# Patient Record
Sex: Female | Born: 1960 | Race: Black or African American | Hispanic: No | Marital: Married | Smoking: Former smoker
Health system: Southern US, Community
[De-identification: ages and names within clinical notes are randomized; demographics above are authoritative.]

## PROBLEM LIST (undated history)

## (undated) DIAGNOSIS — F3289 Other specified depressive episodes: Secondary | ICD-10-CM

## (undated) DIAGNOSIS — Z91018 Allergy to other foods: Secondary | ICD-10-CM

## (undated) DIAGNOSIS — R609 Edema, unspecified: Secondary | ICD-10-CM

## (undated) DIAGNOSIS — K5900 Constipation, unspecified: Secondary | ICD-10-CM

## (undated) DIAGNOSIS — N390 Urinary tract infection, site not specified: Secondary | ICD-10-CM

## (undated) DIAGNOSIS — F419 Anxiety disorder, unspecified: Secondary | ICD-10-CM

## (undated) DIAGNOSIS — M2550 Pain in unspecified joint: Secondary | ICD-10-CM

## (undated) DIAGNOSIS — N951 Menopausal and female climacteric states: Secondary | ICD-10-CM

## (undated) DIAGNOSIS — F329 Major depressive disorder, single episode, unspecified: Secondary | ICD-10-CM

## (undated) DIAGNOSIS — K219 Gastro-esophageal reflux disease without esophagitis: Secondary | ICD-10-CM

## (undated) DIAGNOSIS — E669 Obesity, unspecified: Secondary | ICD-10-CM

## (undated) DIAGNOSIS — G4730 Sleep apnea, unspecified: Secondary | ICD-10-CM

## (undated) DIAGNOSIS — R0602 Shortness of breath: Secondary | ICD-10-CM

## (undated) HISTORY — DX: Other specified depressive episodes: F32.89

## (undated) HISTORY — DX: Gastro-esophageal reflux disease without esophagitis: K21.9

## (undated) HISTORY — DX: Anxiety disorder, unspecified: F41.9

## (undated) HISTORY — DX: Obesity, unspecified: E66.9

## (undated) HISTORY — DX: Shortness of breath: R06.02

## (undated) HISTORY — DX: Urinary tract infection, site not specified: N39.0

## (undated) HISTORY — DX: Pain in unspecified joint: M25.50

## (undated) HISTORY — DX: Edema, unspecified: R60.9

## (undated) HISTORY — DX: Sleep apnea, unspecified: G47.30

## (undated) HISTORY — PX: ABDOMINAL HYSTERECTOMY: SHX81

## (undated) HISTORY — DX: Major depressive disorder, single episode, unspecified: F32.9

## (undated) HISTORY — DX: Allergy to other foods: Z91.018

## (undated) HISTORY — DX: Menopausal and female climacteric states: N95.1

## (undated) HISTORY — DX: Constipation, unspecified: K59.00

---

## 1998-11-04 HISTORY — PX: ABDOMINAL HYSTERECTOMY: SHX81

## 1999-04-24 ENCOUNTER — Other Ambulatory Visit: Admission: RE | Admit: 1999-04-24 | Discharge: 1999-04-24 | Payer: Self-pay | Admitting: Family Medicine

## 1999-05-26 ENCOUNTER — Other Ambulatory Visit: Admission: RE | Admit: 1999-05-26 | Discharge: 1999-05-26 | Payer: Self-pay | Admitting: Obstetrics and Gynecology

## 1999-07-01 ENCOUNTER — Inpatient Hospital Stay (HOSPITAL_COMMUNITY): Admission: RE | Admit: 1999-07-01 | Discharge: 1999-07-05 | Payer: Self-pay | Admitting: Obstetrics and Gynecology

## 1999-07-01 ENCOUNTER — Encounter (INDEPENDENT_AMBULATORY_CARE_PROVIDER_SITE_OTHER): Payer: Self-pay | Admitting: Specialist

## 2000-06-16 ENCOUNTER — Encounter: Admission: RE | Admit: 2000-06-16 | Discharge: 2000-06-30 | Payer: Self-pay | Admitting: Occupational Medicine

## 2003-06-07 ENCOUNTER — Encounter: Admission: RE | Admit: 2003-06-07 | Discharge: 2003-06-28 | Payer: Self-pay | Admitting: Family Medicine

## 2004-10-26 ENCOUNTER — Ambulatory Visit: Payer: Self-pay | Admitting: Family Medicine

## 2005-01-25 ENCOUNTER — Other Ambulatory Visit: Admission: RE | Admit: 2005-01-25 | Discharge: 2005-01-25 | Payer: Self-pay | Admitting: Family Medicine

## 2005-01-25 ENCOUNTER — Ambulatory Visit: Payer: Self-pay | Admitting: Family Medicine

## 2005-01-25 LAB — CONVERTED CEMR LAB: Pap Smear: NORMAL

## 2006-04-15 ENCOUNTER — Ambulatory Visit: Payer: Self-pay | Admitting: Family Medicine

## 2006-05-09 ENCOUNTER — Ambulatory Visit: Payer: Self-pay | Admitting: Internal Medicine

## 2006-09-10 ENCOUNTER — Ambulatory Visit: Payer: Self-pay | Admitting: Family Medicine

## 2007-04-04 ENCOUNTER — Telehealth: Payer: Self-pay | Admitting: Family Medicine

## 2007-04-12 ENCOUNTER — Encounter: Payer: Self-pay | Admitting: Family Medicine

## 2007-04-12 DIAGNOSIS — R51 Headache: Secondary | ICD-10-CM

## 2007-04-12 DIAGNOSIS — N390 Urinary tract infection, site not specified: Secondary | ICD-10-CM | POA: Insufficient documentation

## 2007-04-12 DIAGNOSIS — F39 Unspecified mood [affective] disorder: Secondary | ICD-10-CM | POA: Insufficient documentation

## 2007-04-12 DIAGNOSIS — R609 Edema, unspecified: Secondary | ICD-10-CM | POA: Insufficient documentation

## 2007-04-12 DIAGNOSIS — K219 Gastro-esophageal reflux disease without esophagitis: Secondary | ICD-10-CM

## 2007-04-12 DIAGNOSIS — L089 Local infection of the skin and subcutaneous tissue, unspecified: Secondary | ICD-10-CM | POA: Insufficient documentation

## 2007-04-12 DIAGNOSIS — F32A Depression, unspecified: Secondary | ICD-10-CM | POA: Insufficient documentation

## 2007-04-12 DIAGNOSIS — R519 Headache, unspecified: Secondary | ICD-10-CM | POA: Insufficient documentation

## 2007-04-12 DIAGNOSIS — F329 Major depressive disorder, single episode, unspecified: Secondary | ICD-10-CM

## 2007-04-19 ENCOUNTER — Encounter (INDEPENDENT_AMBULATORY_CARE_PROVIDER_SITE_OTHER): Payer: Self-pay | Admitting: *Deleted

## 2007-06-20 ENCOUNTER — Ambulatory Visit: Payer: Self-pay | Admitting: Family Medicine

## 2007-06-20 DIAGNOSIS — L259 Unspecified contact dermatitis, unspecified cause: Secondary | ICD-10-CM

## 2007-06-23 LAB — CONVERTED CEMR LAB
BUN: 7 mg/dL (ref 6–23)
CO2: 30 meq/L (ref 19–32)
Glucose, Bld: 108 mg/dL — ABNORMAL HIGH (ref 70–99)
Phosphorus: 3.4 mg/dL (ref 2.3–4.6)
Potassium: 3.9 meq/L (ref 3.5–5.1)
Sodium: 141 meq/L (ref 135–145)

## 2007-07-27 ENCOUNTER — Encounter: Payer: Self-pay | Admitting: Family Medicine

## 2007-11-14 ENCOUNTER — Encounter: Payer: Self-pay | Admitting: Family Medicine

## 2007-11-23 ENCOUNTER — Encounter (INDEPENDENT_AMBULATORY_CARE_PROVIDER_SITE_OTHER): Payer: Self-pay | Admitting: *Deleted

## 2008-01-29 ENCOUNTER — Ambulatory Visit: Payer: Self-pay | Admitting: Family Medicine

## 2008-01-30 ENCOUNTER — Encounter: Admission: RE | Admit: 2008-01-30 | Discharge: 2008-01-30 | Payer: Self-pay | Admitting: Family Medicine

## 2008-01-30 LAB — CONVERTED CEMR LAB
BUN: 7 mg/dL (ref 6–23)
Calcium: 9.1 mg/dL (ref 8.4–10.5)
Creatinine, Ser: 0.9 mg/dL (ref 0.4–1.2)
GFR calc Af Amer: 86 mL/min
Glucose, Bld: 80 mg/dL (ref 70–99)
Sodium: 141 meq/L (ref 135–145)

## 2008-02-06 ENCOUNTER — Encounter: Payer: Self-pay | Admitting: Family Medicine

## 2008-02-07 ENCOUNTER — Ambulatory Visit: Payer: Self-pay

## 2008-02-15 ENCOUNTER — Ambulatory Visit: Payer: Self-pay | Admitting: Family Medicine

## 2008-02-15 DIAGNOSIS — E669 Obesity, unspecified: Secondary | ICD-10-CM | POA: Insufficient documentation

## 2008-02-27 ENCOUNTER — Encounter: Payer: Self-pay | Admitting: Family Medicine

## 2008-02-29 ENCOUNTER — Encounter: Admission: RE | Admit: 2008-02-29 | Discharge: 2008-02-29 | Payer: Self-pay | Admitting: Orthopedic Surgery

## 2008-03-01 ENCOUNTER — Encounter: Payer: Self-pay | Admitting: Family Medicine

## 2008-12-05 ENCOUNTER — Encounter: Payer: Self-pay | Admitting: Family Medicine

## 2008-12-09 ENCOUNTER — Encounter (INDEPENDENT_AMBULATORY_CARE_PROVIDER_SITE_OTHER): Payer: Self-pay | Admitting: *Deleted

## 2009-04-23 ENCOUNTER — Ambulatory Visit: Payer: Self-pay | Admitting: Family Medicine

## 2009-04-23 DIAGNOSIS — N951 Menopausal and female climacteric states: Secondary | ICD-10-CM | POA: Insufficient documentation

## 2009-04-23 DIAGNOSIS — B353 Tinea pedis: Secondary | ICD-10-CM | POA: Insufficient documentation

## 2009-04-28 LAB — CONVERTED CEMR LAB
ALT: 19 units/L (ref 0–35)
AST: 21 units/L (ref 0–37)
Albumin: 3.6 g/dL (ref 3.5–5.2)
BUN: 8 mg/dL (ref 6–23)
CO2: 30 meq/L (ref 19–32)
Calcium: 8.8 mg/dL (ref 8.4–10.5)
Chloride: 109 meq/L (ref 96–112)
Creatinine, Ser: 0.9 mg/dL (ref 0.4–1.2)
Total Protein: 6.3 g/dL (ref 6.0–8.3)

## 2009-04-30 ENCOUNTER — Ambulatory Visit: Payer: Self-pay | Admitting: Gastroenterology

## 2009-05-07 ENCOUNTER — Ambulatory Visit: Payer: Self-pay | Admitting: Gastroenterology

## 2009-05-07 LAB — HM COLONOSCOPY

## 2010-06-18 ENCOUNTER — Encounter: Payer: Self-pay | Admitting: Family Medicine

## 2010-06-18 LAB — HM MAMMOGRAPHY: HM Mammogram: NORMAL

## 2010-06-24 ENCOUNTER — Encounter (INDEPENDENT_AMBULATORY_CARE_PROVIDER_SITE_OTHER): Payer: Self-pay | Admitting: *Deleted

## 2010-06-24 ENCOUNTER — Encounter: Payer: Self-pay | Admitting: Family Medicine

## 2010-11-03 NOTE — Letter (Signed)
Summary: Results Follow up Letter  [place] [facility]  [address] [place], [place] [address]   Phone: [phone]  Fax: [phone]    06/24/2010 MRN: [id number]    [facility] [address] [place], [place]  [address]    Dear Molly Bean,  The following are the results of your recent test(s):  Test         Result    Pap Smear:        Normal _____  Not Normal _____ Comments: ______________________________________________________ Cholesterol: LDL(Bad cholesterol):         Your goal is less than:         HDL (Good cholesterol):       Your goal is more than: Comments:  ______________________________________________________ Mammogram:        Normal ___X__  Not Normal _____ Comments:  Yearly follow up is recommended.   ___________________________________________________________________ Hemoccult:        Normal _____  Not normal _______ Comments:    _____________________________________________________________________ Other Tests:    We routinely do not discuss normal results over the telephone.  If you desire a copy of the results, or you have any questions about this information we can discuss them at your next office visit.   Sincerely,    Dwana Curd. Para March, M.D.  [facility]

## 2010-11-03 NOTE — Miscellaneous (Signed)
   Clinical Lists Changes  Observations: Added new observation of MAMMO DUE: 06/19/2011 (06/18/2010 10:45) Added new observation of MAMMOGRAM: Normal (06/18/2010 10:45)

## 2010-11-06 ENCOUNTER — Encounter: Payer: Self-pay | Admitting: Internal Medicine

## 2010-11-06 ENCOUNTER — Ambulatory Visit: Payer: Self-pay | Admitting: Internal Medicine

## 2010-11-06 ENCOUNTER — Other Ambulatory Visit: Payer: BC Managed Care – PPO

## 2010-11-06 ENCOUNTER — Other Ambulatory Visit: Payer: Self-pay | Admitting: Internal Medicine

## 2010-11-06 ENCOUNTER — Ambulatory Visit (INDEPENDENT_AMBULATORY_CARE_PROVIDER_SITE_OTHER): Payer: BC Managed Care – PPO | Admitting: Internal Medicine

## 2010-11-06 DIAGNOSIS — D179 Benign lipomatous neoplasm, unspecified: Secondary | ICD-10-CM | POA: Insufficient documentation

## 2010-11-06 DIAGNOSIS — Z0000 Encounter for general adult medical examination without abnormal findings: Secondary | ICD-10-CM

## 2010-11-06 LAB — BASIC METABOLIC PANEL
BUN: 9 mg/dL (ref 6–23)
CO2: 32 mEq/L (ref 19–32)
Chloride: 104 mEq/L (ref 96–112)
Creatinine, Ser: 0.8 mg/dL (ref 0.4–1.2)
Potassium: 3.9 mEq/L (ref 3.5–5.1)

## 2010-11-06 LAB — URINALYSIS, ROUTINE W REFLEX MICROSCOPIC
Bilirubin Urine: NEGATIVE
Hgb urine dipstick: NEGATIVE
Leukocytes, UA: NEGATIVE
Nitrite: NEGATIVE
Urobilinogen, UA: 0.2 (ref 0.0–1.0)

## 2010-11-06 LAB — CBC WITH DIFFERENTIAL/PLATELET
Basophils Relative: 0.4 % (ref 0.0–3.0)
Eosinophils Absolute: 0.1 10*3/uL (ref 0.0–0.7)
Eosinophils Relative: 1.3 % (ref 0.0–5.0)
HCT: 36.5 % (ref 36.0–46.0)
Lymphs Abs: 2.9 10*3/uL (ref 0.7–4.0)
MCHC: 32.4 g/dL (ref 30.0–36.0)
MCV: 68.6 fl — ABNORMAL LOW (ref 78.0–100.0)
Monocytes Absolute: 0.5 10*3/uL (ref 0.1–1.0)
Neutrophils Relative %: 49.3 % (ref 43.0–77.0)
RBC: 5.31 Mil/uL — ABNORMAL HIGH (ref 3.87–5.11)
WBC: 6.9 10*3/uL (ref 4.5–10.5)

## 2010-11-06 LAB — LIPID PANEL
Cholesterol: 172 mg/dL (ref 0–200)
Triglycerides: 104 mg/dL (ref 0.0–149.0)

## 2010-11-06 LAB — HEPATIC FUNCTION PANEL
ALT: 15 U/L (ref 0–35)
Albumin: 3.8 g/dL (ref 3.5–5.2)
Bilirubin, Direct: 0 mg/dL (ref 0.0–0.3)
Total Protein: 6.6 g/dL (ref 6.0–8.3)

## 2010-11-06 LAB — TSH: TSH: 1.37 u[IU]/mL (ref 0.35–5.50)

## 2010-11-11 NOTE — Assessment & Plan Note (Signed)
Summary: new pt appt rs'd due to epic/cd   Vital Signs:  Patient profile:   50 year old female Height:      62 inches (157.48 cm) Weight:      221.8 pounds (100.82 kg) BMI:     40.71 O2 Sat:      97 % on Room air Temp:     98.0 degrees F (36.67 degrees C) oral Pulse rate:   72 / minute BP sitting:   112 / 72  (left arm) Cuff size:   regular  Vitals Entered By: Orlan Leavens RMA (November 06, 2010 3:58 PM)  O2 Flow:  Room air CC: New patient/ transferring from Dr. Milinda Antis Is Patient Diabetic? No Pain Assessment Patient in pain? no        Primary Care Provider:  Newt Lukes MD  CC:  New patient/ transferring from Dr. Milinda Antis.  History of Present Illness: new to me but known to our group - txfr from [facility] office ([place]) patient is here today for annual physical. Patient feels well-  concerned about tender "lump" over left flank noted (onset) 4 weeks ago no fever, no drainage, no redness, no change in size since onset no precipitating trauma  R foot dermatitis, recurrent prior eval derm 2009 - rx her pill and lotion did  bx - dx with fungal infections  meds worked for a while - took for 3 months --  clear for >63mo, now recurrent symptoms   no gyn since 2000 tot hyst with no symptoms at all  does not see gyn  is utd on mammogram      Preventive Screening-Counseling & Management  Alcohol-Tobacco     Alcohol drinks/day: 0     Alcohol Counseling: not indicated; patient does not drink     Smoking Status: quit     Year Quit: 1980     Tobacco Counseling: to remain off tobacco products  Caffeine-Diet-Exercise     Diet Counseling: to improve diet; diet is suboptimal     Does Patient Exercise: no     Exercise Counseling: to improve exercise regimen     Depression Counseling: not indicated; screening negative for depression  Safety-Violence-Falls     Seat Belt Counseling: not indicated; patient wears seat belts     Helmet Counseling: not applicable     Firearm  Counseling: not indicated; uses recommended firearm safety measures     Violence Counseling: not indicated; no violence risk noted     Fall Risk Counseling: not indicated; no significant falls noted  Clinical Review Panels:  Prevention   Last Mammogram:  Normal (06/18/2010)   Last Pap Smear:  Normal (01/25/2005)   Last Colonoscopy:  Location:  [facility].  (05/07/2009)  Immunizations   Last Tetanus Booster:  Td (04/23/2009)  Complete Metabolic Panel   Glucose:  75 (04/23/2009)   Sodium:  143 (04/23/2009)   Potassium:  3.7 (04/23/2009)   Chloride:  109 (04/23/2009)   CO2:  30 (04/23/2009)   BUN:  8 (04/23/2009)   Creatinine:  0.9 (04/23/2009)   Albumin:  3.6 (04/23/2009)   Total Protein:  6.3 (04/23/2009)   Calcium:  8.8 (04/23/2009)   Total Bili:  0.5 (04/23/2009)   Alk Phos:  68 (04/23/2009)   SGPT (ALT):  19 (04/23/2009)   SGOT (AST):  21 (04/23/2009)   Current Medications (verified): 1)  Est Estrogens-Methyltest Ds 1.25-2.5 Mg Tabs (Est Estrogens-Methyltest) .... Take 1 Tablet By Mouth Once A Day 2)  Amitriptyline Hcl  10 Mg  Tabs (Amitriptyline Hcl) .... Take Two By Mouth Q Hs 3)  Albuterol 90 Mcg/act  Aers (Albuterol) .... Use As Directed- 2 Puffs Q 4 Hours Prn 4)  Triamterene-Hctz 37.5-25 Mg  Tabs (Triamterene-Hctz) .... One Half To One By Mouth Qd 5)  Flexeril 10 Mg  Tabs (Cyclobenzaprine Hcl) .Marland Kitchen.. 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 I have reviewed all other systems and they were negative.   Physical Exam  General:  overweight-appearing.  alert, well-developed, well-nourished, and cooperative to examination.    Head:  Normocephalic and atraumatic without obvious abnormalities. No apparent alopecia or balding. Eyes:  vision grossly intact; pupils equal, round and reactive to light.  conjunctiva and lids normal.    Ears:  R ear normal and L ear normal.   Nose:  External nasal examination shows no deformity or inflammation. Nasal mucosa are pink and moist without lesions or exudates. Mouth:  teeth and gums in good repair; mucous membranes moist, without lesions or ulcers. oropharynx clear without exudate, no erythema.  Neck:  thick, supple, full ROM, no masses, no thyromegaly; no thyroid nodules or tenderness. no JVD or carotid bruits.   Lungs:  normal respiratory effort, no intercostal retractions or use of accessory muscles; normal breath sounds bilaterally - no crackles and no wheezes.    Heart:  normal rate, regular rhythm, no murmur, and no rub. BLE without edema. normal DP pulses and normal cap refill in all 4 extremities    Abdomen:  obese, soft, non-tender, normal bowel sounds, no distention; no masses and no appreciable hepatomegaly or splenomegaly.  lipoma structure 2x2.5 cm over right flank area Genitalia:  defer  Msk:  No deformity or scoliosis noted of thoracic or lumbar spine.   Neurologic:  alert & oriented X3 and cranial nerves II-XII symetrically intact.  strength normal in all extremities, sensation intact to light touch, and gait normal. speech fluent without dysarthria or aphasia; follows commands with good comprehension.  Skin:  no rashes, vesicles, ulcers, or erythema. No nodules or irregularity to  palpation except lipoma - see above (abd) Psych:  Oriented X3, memory intact for recent and remote, normally interactive, good eye contact, not anxious appearing, not depressed appearing, and not agitated.      Impression & Recommendations:  Problem # 1:  PREVENTIVE HEALTH CARE (ICD-V70.0) Patient has been counseled on age-appropriate routine health concerns for screening and prevention. These are reviewed and up-to-date. Immunizations are up-to-date or declined. Labs ordered and ECG reviewed - nsr, no ischemia Orders: TLB-Lipid Panel (80061-LIPID) TLB-BMP (Basic Metabolic Panel-BMET) (80048-METABOL) TLB-CBC Platelet - w/Differential (85025-CBCD) TLB-Hepatic/Liver Function Pnl (80076-HEPATIC) TLB-TSH (Thyroid Stimulating Hormone) (84443-TSH) TLB-Udip w/ Micro (81001-URINE) EKG w/ Interpretation (93000)  Problem # 2:  LIPOMA (ICD-214.9) [place] to reassure pt - also rx for nsaid given "tenderness" Orders: Misc. Referral (Misc. Ref)  Complete Medication List: 1)  Est Estrogens-methyltest Ds 1.25-2.5 Mg Tabs (Est estrogens-methyltest) .... Take 1 tablet by mouth once a day 2)  Amitriptyline Hcl 10 Mg Tabs (Amitriptyline hcl) .... Take two by mouth q hs 3)  Albuterol 90 Mcg/act Aers (Albuterol) .... Use as directed- 2 puffs q 4 hours prn 4)  Triamterene-hctz 37.5-25 Mg Tabs (Triamterene-hctz) .... One half to one by mouth qd 5)  Flexeril 10 Mg Tabs (Cyclobenzaprine hcl) .Marland Kitchen.. 1 by mouth three times a day as needed muscle pain 6)  Gris-peg 250 Mg Tabs (Griseofulvin ultramicrosize) .Marland Kitchen.. 1 by mouth once daily 7)  Naproxen 500 Mg Tabs (Naproxen) .Marland Kitchen.. 1 by mouth two times a day x 7 days, then as needed for pain  Patient Instructions: 1)  it was good to see you today. 2)  medications and history reviewed today 3)  test(s) ordered today - your results will be posted on the phone tree for review in 48-72 hours from the time of test completion; call [phone] and enter your 9 digit MRN (listed  above on this page, just below your name); if any changes need to be made or there are abnormal results, you will be contacted directly.  4)  we'll make referral for ultrasound of right side abdominal wall "nodule". Our office will contact you regarding this appointment once made.  5)  use naprosyn two times a day x 7 days for tenderness and pain symptoms - also refill on foot pill - your prescriptions have been electronically submitted to your pharmacy. Please take as directed. Contact our office if you believe you're having problems with the medication(s).  6)  Please schedule a follow-up appointment in 6-12 months, call sooner if problems.  Prescriptions: NAPROXEN 500 MG TABS (NAPROXEN) 1 by mouth two times a day x 7 days, then as needed for pain  #40 x 0   Entered and Authorized by:   Newt Lukes MD   Signed by:   Newt Lukes MD on 11/06/2010   Method used:   [facility]ically to        [facility]  [address] [phone]* (retail)       [address]       [place], [place]  [id number]       Ph: [phone] or [phone]       Fax: [phone]   RxID:   [id number] GRIS-PEG 250 MG TABS (GRISEOFULVIN ULTRAMICROSIZE) 1 by mouth once daily  #30 x 1   Entered and Authorized by:   Newt Lukes MD   Signed by:   Newt Lukes MD on 11/06/2010   Method used:   [facility]        [facility]  [address] [phone]* (retail)       [address]       [place], [place]  [id number]       Ph: [phone] or [phone]       Fax: [phone]   RxID:   [id number]    Orders Added: 1)  TLB-Lipid Panel [80061-LIPID] 2)  TLB-BMP (Basic Metabolic Panel-BMET) [80048-METABOL] 3)  TLB-CBC Platelet - w/Differential [85025-CBCD] 4)  TLB-Hepatic/Liver Function Pnl [80076-HEPATIC] 5)  TLB-TSH (Thyroid Stimulating Hormone) [84443-TSH] 6)  TLB-Udip w/ Micro [81001-URINE] 7)  EKG w/ Interpretation [93000]  8)  Misc. Referral  [Misc. Ref] 9)  Est. Patient 40-64 years [99396] 10)  Est. Patient Level II [04540]

## 2010-11-17 ENCOUNTER — Other Ambulatory Visit: Payer: Self-pay | Admitting: Internal Medicine

## 2010-11-17 DIAGNOSIS — R109 Unspecified abdominal pain: Secondary | ICD-10-CM

## 2010-11-24 ENCOUNTER — Ambulatory Visit
Admission: RE | Admit: 2010-11-24 | Discharge: 2010-11-24 | Disposition: A | Discharge status: Home / Self Care | Payer: BC Managed Care – PPO | Source: Ambulatory Visit | Attending: Internal Medicine | Admitting: Internal Medicine

## 2010-11-24 DIAGNOSIS — R109 Unspecified abdominal pain: Secondary | ICD-10-CM

## 2011-01-29 ENCOUNTER — Encounter: Payer: Self-pay | Admitting: Internal Medicine

## 2011-01-29 DIAGNOSIS — N951 Menopausal and female climacteric states: Secondary | ICD-10-CM

## 2011-02-01 ENCOUNTER — Encounter: Payer: Self-pay | Admitting: Internal Medicine

## 2011-02-01 ENCOUNTER — Ambulatory Visit (INDEPENDENT_AMBULATORY_CARE_PROVIDER_SITE_OTHER): Payer: BC Managed Care – PPO | Admitting: Internal Medicine

## 2011-02-01 ENCOUNTER — Ambulatory Visit: Payer: BC Managed Care – PPO | Admitting: Internal Medicine

## 2011-02-01 DIAGNOSIS — M7918 Myalgia, other site: Secondary | ICD-10-CM

## 2011-02-01 DIAGNOSIS — F329 Major depressive disorder, single episode, unspecified: Secondary | ICD-10-CM

## 2011-02-01 DIAGNOSIS — IMO0001 Reserved for inherently not codable concepts without codable children: Secondary | ICD-10-CM

## 2011-02-01 MED ORDER — BUPROPION HCL ER (XL) 150 MG PO TB24: 150.0000 mg | ORAL_TABLET | Freq: Every day | ORAL | Status: DC

## 2011-02-01 MED ORDER — MELOXICAM 15 MG PO TABS: 15.0000 mg | ORAL_TABLET | Freq: Every day | ORAL | Status: AC

## 2011-02-01 MED ORDER — METHOCARBAMOL 750 MG PO TABS: 750.0000 mg | ORAL_TABLET | Freq: Three times a day (TID) | ORAL | Status: AC | PRN

## 2011-02-01 MED ORDER — TRIAMTERENE-HCTZ 37.5-25 MG PO TABS: 1.0000 | ORAL_TABLET | Freq: Every day | ORAL | Status: DC

## 2011-02-01 MED ORDER — AMITRIPTYLINE HCL 10 MG PO TABS: 10.0000 mg | ORAL_TABLET | Freq: Every day | ORAL | Status: DC

## 2011-02-01 MED ORDER — CYCLOBENZAPRINE HCL 10 MG PO TABS: 10.0000 mg | ORAL_TABLET | Freq: Three times a day (TID) | ORAL | Status: DC | PRN

## 2011-02-01 MED ORDER — NAPROXEN 500 MG PO TABS: 500.0000 mg | ORAL_TABLET | Freq: Two times a day (BID) | ORAL | Status: DC | PRN

## 2011-02-01 NOTE — Assessment & Plan Note (Signed)
Stress spasm and tenderness in B trap and scapula region Change flexeril due to sedation - robaxin instead Also 2 weeks meloxicam - erx done

## 2011-02-01 NOTE — Assessment & Plan Note (Signed)
Physical and emotional exhaustion - many stressors precipitating same Remote tx 2000 with wellbutrin effective - will resume same now - Offered counseling - pt will consider -  F/u 6-8 weeks to review symptoms and med dose, sooner if problems - pt agrees to same

## 2011-02-01 NOTE — Progress Notes (Signed)
  Subjective:    Patient ID: Molly Bean, female    DOB: 1961-04-10, 50 y.o.   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 OBESITY   . UTI'S, CHRONIC   . MENOPAUSAL SYNDROME   . Edema   . DEPRESSION   . GERD      Review of Systems  Constitutional: Negative for fever.  Respiratory: Negative for shortness of breath.   Cardiovascular: Negative for chest pain.  Neurological: Negative for dizziness and headaches.       Objective:   Physical Exam  Constitutional: She is oriented to person, place, and time. She appears well-developed and well-nourished. No distress.  Cardiovascular: Normal rate, regular rhythm and normal heart sounds.   No murmur heard. Pulmonary/Chest: Effort normal and breath sounds normal. No respiratory distress.  Musculoskeletal:       Myofascial spasm and tenderness to palpation L>R trap and scapular region - FROM with 5/5 strength  Neurological: She is alert and oriented to person, place, and time. No cranial nerve deficit. Coordination normal.  Psychiatric: Her behavior is normal. Judgment and thought content normal.       Appears depressed  BP 110/72  Pulse 67  Temp(Src) 98.6 F (37 C) (Oral)  Ht 5\' 2"  (1.575 m)  Wt 223 lb 1.9 oz (101.207 kg)  BMI 40.81 kg/m2  SpO2 99% Lab Results  Component Value Date   WBC 6.9 11/06/2010   HGB 11.8* 11/06/2010   HCT 36.5 11/06/2010   PLT 203.0 11/06/2010   CHOL 172 11/06/2010   TRIG 104.0 11/06/2010   HDL 53.70 11/06/2010   ALT 15 11/06/2010   AST 17 11/06/2010   NA 141 11/06/2010   K 3.9  11/06/2010   CL 104 11/06/2010   CREATININE 0.8 11/06/2010   BUN 9 11/06/2010   CO2 32 11/06/2010   TSH 1.37 11/06/2010        Assessment & Plan:  See problem list. Medications and labs reviewed today.

## 2011-02-01 NOTE — Patient Instructions (Signed)
It was good to see you today. We have reviewed your prior records including labs and tests today Start wellbutrin for energy and stress and meloxicam + robaxin for muscle spasm and pains -  Your prescription(s) have been submitted to your pharmacy. Please take as directed and contact our office if you believe you are having problem(s) with the medication(s). Also refill on medication(s) as discussed today. Please schedule followup in 6-8 weeks, call sooner if problems.

## 2011-02-19 NOTE — Assessment & Plan Note (Signed)
[facility]                                   ON-CALL NOTE   OTTIS, SARNOWSKI                     MRN:          [id number]  DATE:05/08/2006                            DOB:          [birth year]    PHONE NUMBER:  [phone]   HISTORY OF PRESENT ILLNESS:  The patient states that she may have gotten  bitten by something.  She has a tender, firm nodule on her chin area.  Earlier this week, she had one on her thigh area that had a center, and it  sounds like it drained.  She says it hurts more than it itches.  Wonders if  it could be a but bite.  She has no associated fever in other areas.  I  discussed with her that it is possible that it is a skin infection and not a  bite, and that she should be seen tomorrow or if tonight it increases in  size, pain or redness, then she is to be seen in the emergency room.                                   Neta Mends. Fabian Sharp, MD   WKP/MedQ  DD:  05/08/2006  DT:  05/09/2006  Job #:  [id number]   cc:   Marne A. Milinda Antis, MD

## 2011-03-22 ENCOUNTER — Ambulatory Visit: Payer: BC Managed Care – PPO | Admitting: Internal Medicine

## 2011-09-02 ENCOUNTER — Encounter: Payer: Self-pay | Admitting: Internal Medicine

## 2012-09-04 LAB — HM MAMMOGRAPHY: HM Mammogram: NEGATIVE

## 2013-01-30 ENCOUNTER — Other Ambulatory Visit (INDEPENDENT_AMBULATORY_CARE_PROVIDER_SITE_OTHER): Payer: BC Managed Care – PPO

## 2013-01-30 ENCOUNTER — Encounter: Payer: Self-pay | Admitting: Internal Medicine

## 2013-01-30 ENCOUNTER — Ambulatory Visit (INDEPENDENT_AMBULATORY_CARE_PROVIDER_SITE_OTHER): Payer: BC Managed Care – PPO | Admitting: Internal Medicine

## 2013-01-30 VITALS — BP 110/70 | HR 71 | Temp 97.8°F | Wt 231.8 lb

## 2013-01-30 DIAGNOSIS — R55 Syncope and collapse: Secondary | ICD-10-CM

## 2013-01-30 DIAGNOSIS — R21 Rash and other nonspecific skin eruption: Secondary | ICD-10-CM

## 2013-01-30 DIAGNOSIS — E669 Obesity, unspecified: Secondary | ICD-10-CM

## 2013-01-30 LAB — CBC WITH DIFFERENTIAL/PLATELET
Basophils Absolute: 0 10*3/uL (ref 0.0–0.1)
HCT: 39.1 % (ref 36.0–46.0)
Hemoglobin: 12.3 g/dL (ref 12.0–15.0)
Lymphs Abs: 2.7 10*3/uL (ref 0.7–4.0)
MCV: 67.4 fl — ABNORMAL LOW (ref 78.0–100.0)
Monocytes Absolute: 0.5 10*3/uL (ref 0.1–1.0)
Monocytes Relative: 6.3 % (ref 3.0–12.0)
Neutro Abs: 4 10*3/uL (ref 1.4–7.7)
Platelets: 210 10*3/uL (ref 150.0–400.0)
RDW: 16.8 % — ABNORMAL HIGH (ref 11.5–14.6)

## 2013-01-30 LAB — HEPATIC FUNCTION PANEL
AST: 18 U/L (ref 0–37)
Albumin: 4 g/dL (ref 3.5–5.2)
Alkaline Phosphatase: 72 U/L (ref 39–117)
Total Protein: 7.3 g/dL (ref 6.0–8.3)

## 2013-01-30 LAB — BASIC METABOLIC PANEL
Calcium: 9.5 mg/dL (ref 8.4–10.5)
Creatinine, Ser: 0.8 mg/dL (ref 0.4–1.2)
Sodium: 140 mEq/L (ref 135–145)

## 2013-01-30 LAB — TSH: TSH: 2.1 u[IU]/mL (ref 0.35–5.50)

## 2013-01-30 MED ORDER — TRIAMCINOLONE ACETONIDE 0.5 % EX OINT: TOPICAL_OINTMENT | Freq: Two times a day (BID) | CUTANEOUS | Status: DC

## 2013-01-30 MED ORDER — TRIAMTERENE-HCTZ 37.5-25 MG PO TABS: 0.5000 | ORAL_TABLET | Freq: Every day | ORAL | Status: DC

## 2013-01-30 MED ORDER — CYCLOBENZAPRINE HCL 10 MG PO TABS: 10.0000 mg | ORAL_TABLET | Freq: Three times a day (TID) | ORAL | Status: DC | PRN

## 2013-01-30 NOTE — Assessment & Plan Note (Signed)
Wt Readings from Last 3 Encounters:  01/30/13 231 lb 12.8 oz (105.144 kg)  02/01/11 223 lb 1.9 oz (101.207 kg)  11/06/10 221 lb 12.8 oz (100.608 kg)   The patient is asked to make an attempt to improve diet and exercise patterns to aid in medical management of this problem.

## 2013-01-30 NOTE — Patient Instructions (Signed)
It was good to see you today. We have reviewed your prior records including labs and tests today Test(s) ordered today. Your results will be released to MyChart (or called to you) after review, usually within 72hours after test completion. If any changes need to be made, you will be notified at that same time. we'll make referral for cardiac stress test. Our office will contact you regarding appointment(s) once made. Medications reviewed and updated, no changes recommended at this time. Refill on medication(s) as discussed today. Please schedule followup in 3-4 months for physical and labs, call sooner if problems.  Work on lifestyle changes as discussed (low fat, low carb, increased protein diet; improved exercise efforts; weight loss) to control sugar, blood pressure and cholesterol levels and/or reduce risk of developing other medical problems. Look into Limit[employer]cy or other type of food journal to assist you in this process.  Palpitations  A palpitation is the feeling that your heartbeat is irregular or is faster than normal. It may feel like your heart is fluttering or skipping a beat. Palpitations are usually not a serious problem. However, in some cases, you may need further medical evaluation. CAUSES  Palpitations can be caused by:  Smoking.  Caffeine or other stimulants, such as diet pills or energy drinks.  Alcohol.  Stress and anxiety.  Strenuous physical activity.  Fatigue.  Certain medicines.  Heart disease, especially if you have a history of arrhythmias. This includes atrial fibrillation, atrial flutter, or supraventricular tachycardia.  An improperly working pacemaker or defibrillator. DIAGNOSIS  To find the cause of your palpitations, your caregiver will take your history and perform a physical exam. Tests may also be done, including:  Electrocardiography (ECG). This test records the heart's electrical activity.  Cardiac monitoring. This allows your caregiver  to monitor your heart rate and rhythm in real time.  Holter monitor. This is a portable device that records your heartbeat and can help diagnose heart arrhythmias. It allows your caregiver to track your heart activity for several days, if needed.  Stress tests by exercise or by giving medicine that makes the heart beat faster. TREATMENT  Treatment of palpitations depends on the cause of your symptoms and can vary greatly. Most cases of palpitations do not require any treatment other than time, relaxation, and monitoring your symptoms. Other causes, such as atrial fibrillation, atrial flutter, or supraventricular tachycardia, usually require further treatment. HOME CARE INSTRUCTIONS   Avoid:  Caffeinated coffee, tea, soft drinks, diet pills, and energy drinks.  Chocolate.  Alcohol.  Stop smoking if you smoke.  Reduce your stress and anxiety. Things that can help you relax include:  A method that measures bodily functions so you can learn to control them (biofeedback).  Yoga.  Meditation.  Physical activity such as swimming, jogging, or walking.  Get plenty of rest and sleep. SEEK MEDICAL CARE IF:   You continue to have a fast or irregular heartbeat beyond 24 hours.  Your palpitations occur more often. SEEK IMMEDIATE MEDICAL CARE IF:  You develop chest pain or shortness of breath.  You have a severe headache.  You feel dizzy, or you faint. MAKE SURE YOU:  Understand these instructions.  Will watch your condition.  Will get help right away if you are not doing well or get worse. Document Released: 09/17/2000 Document Revised: 03/21/2012 Document Reviewed: 11/19/2011 [facility] Patient Information 2013 [place], [place]. Exercise to Lose Weight Exercise and a healthy diet may help you lose weight. Your doctor may suggest specific exercises. EXERCISE IDEAS  AND TIPS  Choose low-cost things you enjoy doing, such as walking, bicycling, or exercising to workout  videos.  Take stairs instead of the elevator.  Walk during your lunch break.  Park your car further away from work or school.  Go to a gym or an exercise class.  Start with 5 to 10 minutes of exercise each day. Build up to 30 minutes of exercise 4 to 6 days a week.  Wear shoes with good support and comfortable clothes.  Stretch before and after working out.  Work out until you breathe harder and your heart beats faster.  Drink extra water when you exercise.  Do not do so much that you hurt yourself, feel dizzy, or get very short of breath. Exercises that burn about 150 calories:  Running 1  miles in 15 minutes.  Playing volleyball for 45 to 60 minutes.  Washing and waxing a car for 45 to 60 minutes.  Playing touch football for 45 minutes.  Walking 1  miles in 35 minutes.  Pushing a stroller 1  miles in 30 minutes.  Playing basketball for 30 minutes.  Raking leaves for 30 minutes.  Bicycling 5 miles in 30 minutes.  Walking 2 miles in 30 minutes.  Dancing for 30 minutes.  Shoveling snow for 15 minutes.  Swimming laps for 20 minutes.  Walking up stairs for 15 minutes.  Bicycling 4 miles in 15 minutes.  Gardening for 30 to 45 minutes.  Jumping rope for 15 minutes.  Washing windows or floors for 45 to 60 minutes. Document Released: 10/23/2010 Document Revised: 12/13/2011 Document Reviewed: 10/23/2010 [facility] Patient Information 2013 [place], [place].

## 2013-01-30 NOTE — Progress Notes (Signed)
Subjective:    Patient ID: Molly Bean, female    DOB: May 25, 1961, 52 y.o.   MRN: [id number]  HPI  complains of near syncope event Onset last week (01/23/13) - single event, lasted  8-10 minutes associated with rapid heartbeat, dizziness and blurred vision Also tremulous and anxious feeling Onset while at rest sitting on edge of bed,  Resolved while laying flat No history of same  Past Medical History  Diagnosis Date  . OBESITY   . UTI'S, CHRONIC   . MENOPAUSAL SYNDROME   . Edema   . DEPRESSION   . GERD     Review of Systems  Constitutional: Negative for fever, fatigue and unexpected weight change.  Respiratory: Positive for chest tightness. Negative for cough, shortness of breath and wheezing.   Cardiovascular: Positive for palpitations. Negative for chest pain and leg swelling.  Neurological: Positive for dizziness and light-headedness. Negative for seizures, facial asymmetry, speech difficulty, weakness, numbness and headaches.       Objective:   Physical Exam BP 110/70  Pulse 71  Temp(Src) 97.8 F (36.6 C) (Oral)  Wt 231 lb 12.8 oz (105.144 kg)  BMI 42.39 kg/m2  SpO2 95% Wt Readings from Last 3 Encounters:  01/30/13 231 lb 12.8 oz (105.144 kg)  02/01/11 223 lb 1.9 oz (101.207 kg)  11/06/10 221 lb 12.8 oz (100.608 kg)   Constitutional: She is overweight, but appears well-developed and well-nourished. No distress.  HENT: Head: Normocephalic and atraumatic. Ears: B TMs ok, no erythema or effusion; Nose: Nose normal. Mouth/Throat: Oropharynx is clear and moist. No oropharyngeal exudate.  Eyes: Conjunctivae and EOM are normal. Pupils are equal, round, and reactive to light. No scleral icterus.  Neck: Normal range of motion. Neck supple. No JVD present. No thyromegaly present.  Cardiovascular: Normal rate, regular rhythm with frequent PVC-like "extra" beat and normal heart sounds.  No murmur heard. No BLE edema. Pulmonary/Chest: Effort normal and breath sounds  normal. No respiratory distress. She has no wheezes Musculoskeletal: Normal range of motion, no joint effusions. No gross deformities Neurological: She is alert and oriented to person, place, and time. No cranial nerve deficit. Coordination, balance, strength, speech and gait are normal.  Skin: annular slightly raised erythematous patch on left proximal forearm extensor surface, approximately 2-2-1/2 inch diameter. Remaining skin is warm and dry. No rash noted. No erythema.  Psychiatric: She has a normal mood and affect. Her behavior is normal. Judgment and thought content normal.   Lab Results  Component Value Date   WBC 6.9 11/06/2010   HGB 11.8* 11/06/2010   HCT 36.5 11/06/2010   PLT 203.0 11/06/2010   GLUCOSE 93 11/06/2010   CHOL 172 11/06/2010   TRIG 104.0 11/06/2010   HDL 53.70 11/06/2010   LDLCALC 98 11/06/2010   ALT 15 11/06/2010   AST 17 11/06/2010   NA 141 11/06/2010   K 3.9 11/06/2010   CL 104 11/06/2010   CREATININE 0.8 11/06/2010   BUN 9 11/06/2010   CO2 32 11/06/2010   TSH 1.37 11/06/2010   ECG: sinus brady - 57 bpm - rate variation, but no arrythmia or ischemic change     Assessment & Plan:   1) Near syncope event 01/23/13 Associated with tachycardia, and dizziness, sweating, shaking Episode lasted 8-10 minutes prior to spontaneous gradual resolution ECG today with rate variation but no ischemic change or arrhythmia Denies medication changes or anxiety/panic attack symptoms  Hemodynamically/O2 stable, exam with irregularity and PVCs sounding heart tones, otherwise benign  Check labs  today to exclude metabolic abnormality such as anemia, thyroid disease, dehydration, hyperglycemia Refer for cardiac stress test given family history of CAD and personal risk factors Asymptomatic at this time, hold empiric treatment pending results of workup or recurrence of symptoms Patient understands plan and agrees, we'll go to the emergency room or call if recurrent or worsening symptoms between now and  completion of testing as planned  2) Rash L proximal forearm - present >3 months tx with triamcinolone ointment twice a day If unimproved, patient to notify us for referral to dermatology as needed

## 2013-02-13 ENCOUNTER — Encounter: Payer: Self-pay | Admitting: Internal Medicine

## 2013-02-14 ENCOUNTER — Encounter: Payer: BC Managed Care – PPO | Admitting: Physician Assistant

## 2013-03-04 ENCOUNTER — Other Ambulatory Visit: Payer: Self-pay | Admitting: Internal Medicine

## 2013-03-08 ENCOUNTER — Ambulatory Visit (INDEPENDENT_AMBULATORY_CARE_PROVIDER_SITE_OTHER): Payer: BC Managed Care – PPO | Admitting: Physician Assistant

## 2013-03-08 DIAGNOSIS — R0789 Other chest pain: Secondary | ICD-10-CM

## 2013-03-08 DIAGNOSIS — R55 Syncope and collapse: Secondary | ICD-10-CM

## 2013-03-08 NOTE — Progress Notes (Signed)
Molly Bean is a 52 y.o. female referred by PCP for ETT for evaluation of near syncope.  No hx of CAD.  No hx of DM2, HTN, HL.  She is an ex smoker.  No significant FHx of CAD.  No CP. She does note DOE.  No syncope.  Exam unremarkable.  ECG with non specific ST-T wave changes.  Exercise Treadmill Test  Pre-Exercise Testing Evaluation Rhythm: normal sinus  Rate: 70     Test  Exercise Tolerance Test Ordering MD: Rene Paci, MD  Interpreting MD: Tereso Newcomer, PA-C  Unique Test No: 1  Treadmill:  1  Indication for ETT: exertional dyspnea  Contraindication to ETT: No   Stress Modality: exercise - treadmill  Cardiac Imaging Performed: non   Protocol: modified Bruce - maximal  Max BP:  164/84  Max MPHR (bpm):  167 85% MPR (bpm):  142  MPHR obtained (bpm):  142 % MPHR obtained:  85  Reached 85% MPHR (min:sec):  8:17 Total Exercise Time (min-sec):  9:00  Workload in METS:  7.2 Borg Scale: 15  Reason ETT Terminated:  fatigue    ST Segment Analysis At Rest: non-specific ST segment slurring With Exercise: non-specific ST changes  Other Information Arrhythmia:  No Angina during ETT:  absent (0) Quality of ETT:  diagnostic  ETT Interpretation:  normal - no evidence of ischemia by ST analysis  Comments: Fair exercise tolerance. No chest pain. Normal BP response to exercise. There are some baseline ST changes at rest that almost normalize with exercise.  No significant ST depression with exercise. Overall low risk  ETT.  Recommendations: Low risk ETT. If concern for ischemic heart disease persists, consider nuclear study. F/u with PCP as directed. Tereso Newcomer, PA-C   03/08/2013 10:06 AM

## 2013-10-02 LAB — HM MAMMOGRAPHY

## 2013-10-08 ENCOUNTER — Encounter: Payer: Self-pay | Admitting: Internal Medicine

## 2014-03-09 ENCOUNTER — Encounter (HOSPITAL_COMMUNITY): Payer: Self-pay | Admitting: Emergency Medicine

## 2014-03-09 ENCOUNTER — Emergency Department (HOSPITAL_COMMUNITY)
Admission: EM | Admit: 2014-03-09 | Discharge: 2014-03-09 | Disposition: A | Discharge status: Home / Self Care | Payer: BC Managed Care – PPO | Attending: Emergency Medicine | Admitting: Emergency Medicine

## 2014-03-09 DIAGNOSIS — R070 Pain in throat: Secondary | ICD-10-CM | POA: Insufficient documentation

## 2014-03-09 DIAGNOSIS — R0609 Other forms of dyspnea: Secondary | ICD-10-CM | POA: Insufficient documentation

## 2014-03-09 DIAGNOSIS — Y929 Unspecified place or not applicable: Secondary | ICD-10-CM | POA: Insufficient documentation

## 2014-03-09 DIAGNOSIS — Z8742 Personal history of other diseases of the female genital tract: Secondary | ICD-10-CM | POA: Insufficient documentation

## 2014-03-09 DIAGNOSIS — Z87891 Personal history of nicotine dependence: Secondary | ICD-10-CM | POA: Insufficient documentation

## 2014-03-09 DIAGNOSIS — R6889 Other general symptoms and signs: Secondary | ICD-10-CM | POA: Insufficient documentation

## 2014-03-09 DIAGNOSIS — E669 Obesity, unspecified: Secondary | ICD-10-CM | POA: Insufficient documentation

## 2014-03-09 DIAGNOSIS — Y9389 Activity, other specified: Secondary | ICD-10-CM | POA: Insufficient documentation

## 2014-03-09 DIAGNOSIS — R0989 Other specified symptoms and signs involving the circulatory and respiratory systems: Secondary | ICD-10-CM | POA: Insufficient documentation

## 2014-03-09 DIAGNOSIS — T628X1A Toxic effect of other specified noxious substances eaten as food, accidental (unintentional), initial encounter: Secondary | ICD-10-CM | POA: Insufficient documentation

## 2014-03-09 DIAGNOSIS — Z79899 Other long term (current) drug therapy: Secondary | ICD-10-CM | POA: Insufficient documentation

## 2014-03-09 DIAGNOSIS — IMO0002 Reserved for concepts with insufficient information to code with codable children: Secondary | ICD-10-CM | POA: Insufficient documentation

## 2014-03-09 DIAGNOSIS — Z8659 Personal history of other mental and behavioral disorders: Secondary | ICD-10-CM | POA: Insufficient documentation

## 2014-03-09 DIAGNOSIS — R1310 Dysphagia, unspecified: Secondary | ICD-10-CM | POA: Insufficient documentation

## 2014-03-09 DIAGNOSIS — T781XXA Other adverse food reactions, not elsewhere classified, initial encounter: Secondary | ICD-10-CM | POA: Insufficient documentation

## 2014-03-09 DIAGNOSIS — K219 Gastro-esophageal reflux disease without esophagitis: Secondary | ICD-10-CM | POA: Insufficient documentation

## 2014-03-09 DIAGNOSIS — Z87440 Personal history of urinary (tract) infections: Secondary | ICD-10-CM | POA: Insufficient documentation

## 2014-03-09 DIAGNOSIS — R000 Tachycardia, unspecified: Secondary | ICD-10-CM | POA: Insufficient documentation

## 2014-03-09 LAB — I-STAT CHEM 8, ED
BUN: 6 mg/dL (ref 6–23)
CALCIUM ION: 1.09 mmol/L — AB (ref 1.12–1.23)
Chloride: 104 mEq/L (ref 96–112)
Creatinine, Ser: 1 mg/dL (ref 0.50–1.10)
GLUCOSE: 105 mg/dL — AB (ref 70–99)
HEMATOCRIT: 42 % (ref 36.0–46.0)
Hemoglobin: 14.3 g/dL (ref 12.0–15.0)
Potassium: 4.1 mEq/L (ref 3.7–5.3)
Sodium: 142 mEq/L (ref 137–147)
TCO2: 28 mmol/L (ref 0–100)

## 2014-03-09 LAB — I-STAT TROPONIN, ED: TROPONIN I, POC: 0.01 ng/mL (ref 0.00–0.08)

## 2014-03-09 MED ORDER — FAMOTIDINE 20 MG PO TABS: 20.0000 mg | ORAL_TABLET | Freq: Two times a day (BID) | ORAL | Status: DC

## 2014-03-09 MED ORDER — FAMOTIDINE IN NACL 20-0.9 MG/50ML-% IV SOLN
20.0000 mg | Freq: Once | INTRAVENOUS | Status: AC
  Administered 2014-03-09: 20 mg via INTRAVENOUS
  Filled 2014-03-09: qty 50

## 2014-03-09 MED ORDER — PREDNISONE 20 MG PO TABS
60.0000 mg | ORAL_TABLET | Freq: Once | ORAL | Status: AC
  Administered 2014-03-09: 60 mg via ORAL
  Filled 2014-03-09: qty 3

## 2014-03-09 MED ORDER — DIPHENHYDRAMINE HCL 50 MG PO CAPS: ORAL_CAPSULE | ORAL | Status: DC

## 2014-03-09 MED ORDER — PREDNISONE 20 MG PO TABS: ORAL_TABLET | ORAL | Status: DC

## 2014-03-09 MED ORDER — EPINEPHRINE 0.3 MG/0.3ML IJ SOAJ
0.3000 mg | Freq: Once | INTRAMUSCULAR | Status: AC
  Administered 2014-03-09: 0.3 mg via INTRAMUSCULAR
  Filled 2014-03-09: qty 0.3

## 2014-03-09 MED ORDER — DIPHENHYDRAMINE HCL 50 MG/ML IJ SOLN
50.0000 mg | Freq: Once | INTRAMUSCULAR | Status: AC
  Administered 2014-03-09: 50 mg via INTRAVENOUS
  Filled 2014-03-09: qty 1

## 2014-03-09 NOTE — ED Provider Notes (Signed)
CSN: [id number]     Arrival date & time 03/09/14  1551 History   First MD Initiated Contact with Patient 03/09/14 [phone]     Chief Complaint  Patient presents with  . Allergic Reaction     (Consider location/radiation/quality/duration/timing/severity/associated sxs/prior Treatment) HPI 53 year old female with a history of allergy to shrimp which causes itching to her tongue ate oysters for the first time last night and shortly thereafter developed a sensation of throat tightness fullness pain mild but slight difficulty swallowing with slight difficulty breathing sensation in her neck only with no chest pain no shortness of breath no coughing no tongue swelling no lip swelling no itching no facial swelling no rash no generalized body itching no lightheadedness no stridor no drooling no voice change and she has been able to swallow since then with slight dysphagia at the neck region with no visible swelling or color change to her neck but a sensation of fullness or swelling on the inside of her neck according to the patient. Treatment prior to arrival consisted of Benadryl this morning without change. Past Medical History  Diagnosis Date  . OBESITY   . UTI'S, CHRONIC   . MENOPAUSAL SYNDROME   . Edema   . DEPRESSION   . GERD    Past Surgical History  Procedure Laterality Date  . Cesarean section    . Abdominal hysterectomy      total fibroids   Family History  Problem Relation Age of Onset  . Coronary artery disease Mother   . Hypertension Mother   . Diabetes Mother   . Seizures Father   . Lupus Cousin   . Atrial fibrillation Mother   . Breast cancer Sister 44   History  Substance Use Topics  . Smoking status: Former Smoker -- 0.30 packs/day for 20 years    Types: Cigarettes  . Smokeless tobacco: Former Systems developer    Quit date: 10/05/1987  . Alcohol Use: No   OB History   Grav Para Term Preterm Abortions TAB SAB Ect Mult Living                 Review of Systems 10 Systems  reviewed and are negative for acute change except as noted in the HPI.   Allergies  Oxycodone  Home Medications   Prior to Admission medications   Medication Sig Start Date End Date Taking? Authorizing Provider  acetaminophen (TYLENOL) 500 MG tablet Take 1,000 mg by mouth every 6 (six) hours as needed for mild pain or headache.   Yes Historical Provider, MD  diphenhydrAMINE (BENADRYL) 25 mg capsule Take 50 mg by mouth once as needed for allergies.   Yes Historical Provider, MD  triamcinolone ointment (KENALOG) 0.5 % Apply 1 application topically 2 (two) times daily.   Yes Historical Provider, MD  triamterene-hydrochlorothiazide (MAXZIDE-25) 37.5-25 MG per tablet Take 0.5-1 tablets by mouth daily. 01/30/13  Yes Rowe Clack, MD  diphenhydrAMINE (BENADRYL) 50 MG capsule 50mg  po q4 hours prn itch 03/09/14   Babette Relic, MD  famotidine (PEPCID) 20 MG tablet Take 1 tablet (20 mg total) by mouth 2 (two) times daily. 03/09/14   Babette Relic, MD  predniSONE (DELTASONE) 20 MG tablet 2 tabs po daily x 3 days 03/09/14   Babette Relic, MD   BP 122/55  Pulse 104  Temp(Src) 99.7 F (37.6 C) (Oral)  Resp 18  SpO2 94% Physical Exam  Nursing note and vitals reviewed. Constitutional:  Awake, alert, nontoxic appearance.  HENT:  Head: Atraumatic.  Mouth/Throat: Oropharynx is clear and moist. No oropharyngeal exudate.  Speech normal no stridor no drooling  Eyes: Right eye exhibits no discharge. Left eye exhibits no discharge.  Neck: Neck supple.  Cardiovascular: Regular rhythm.   No murmur heard. Tachycardic  Pulmonary/Chest: Effort normal and breath sounds normal. No stridor. No respiratory distress. She has no wheezes. She has no rales. She exhibits no tenderness.  Pulse oximetry normal on room air 98%  Abdominal: Soft. She exhibits no distension. There is no tenderness. There is no rebound and no guarding.  Musculoskeletal: She exhibits no tenderness.  Baseline ROM, no obvious new focal  weakness.  Neurological: She is alert.  Mental status and motor strength appears baseline for patient and situation.  Skin: No rash noted.  Psychiatric: She has a normal mood and affect.    ED Course  Procedures (including critical care time) Feels much better although symptoms not completely resolved she feels ready for discharge with followup with ENT. Patient informed of clinical course, understand medical decision-making process, and agree with plan. Labs Review Labs Reviewed  I-STAT CHEM 8, ED - Abnormal; Notable for the following:    Glucose, Bld 105 (*)    Calcium, Ion 1.09 (*)    All other components within normal limits  I-STAT TROPOININ, ED    Imaging Review No results found.   EKG Interpretation   Date/Time:  Saturday March 09 2014 16:45:15 EDT Ventricular Rate:  103 PR Interval:  159 QRS Duration: 83 QT Interval:  337 QTC Calculation: 441 R Axis:   26 Text Interpretation:  Sinus tachycardia Low voltage, precordial leads  Borderline T abnormalities, diffuse leads No previous ECGs available  Confirmed by [facility]  MD, Jenny Reichmann ([id number]) on 03/09/2014 5:03:08 PM      MDM   Final diagnoses:  Allergic reaction to food    I doubt any other EMC precluding discharge at this time including, but not necessarily limited to the following:anaphylaxis.    Babette Relic, MD 03/12/14 0030

## 2014-03-09 NOTE — ED Notes (Signed)
Pt presents with c/o possible allergic reaction. Pt says that she had oysters last night and ever since she ate the oysters she feels a fullness in her throat. Pt says she feels like her throat is swelling and she feels like she is having a hard time breathing. Pt is tachycardic at 114 at this time, 98% RA, NAD. Pt has no known allergy to oysters or seafood.

## 2014-03-09 NOTE — Discharge Instructions (Signed)
Food Allergy  A food allergy causes your body to have a strange reaction after you eat or drink certain foods or drinks. Allergic reactions can cause puffiness (swelling) and itchy, red rashes and hives. Sometimes you will throw up (vomit) or have watery poop (diarrhea). Severe allergic reactions can be life-threatening. These reactions can make it hard to breathe or swallow.  HOME CARE  If you do not know what caused your allergic reaction:  · Write down the foods and drinks you had before the reaction.  · Write down any problems you had.  · Stop eating or drinking things that cause you to have a reaction.  If you have hives or a rash:  · Take medicine as told by your doctor.  · Place cold cloths on your skin.  · Take baths in cool water.  · Do not take hot baths or showers.  If you are severely allergic:  · Wear a medical bracelet or necklace that lists your allergy.  · Carry your allergy kit or medicine shot to treat severe allergic reactions with you. These can save your life.  · Carry backup medicine shots. You can have a delayed reaction after the medicine from your first shot wears off. This can be just as serious as the first reaction.  · Do not drive until medicine from your shot has worn off, unless your doctor says it is okay.  GET HELP RIGHT AWAY IF:   · You have trouble breathing or you are wheezing.  · You have a tight feeling in your chest or throat.  · You have puffiness around your mouth.  · You have hives, puffiness, or itching all over your body.  · You think you are having an allergic reaction. Problems usually start within 30 minutes after eating a food you are allergic to.  · Your problems are not better after 2 days.  · You have new problems.  · Your problems come back.  MAKE SURE YOU:   · Understand these instructions.  · Will watch your condition.  · Will get help right away if you are not doing well or get worse.  Document Released: 03/10/2010 Document Revised: 12/13/2011 Document Reviewed:  03/10/2010  [employer]® Patient Information ©2014 [employer].

## 2014-06-14 ENCOUNTER — Ambulatory Visit (INDEPENDENT_AMBULATORY_CARE_PROVIDER_SITE_OTHER): Payer: BC Managed Care – PPO | Admitting: Internal Medicine

## 2014-06-14 ENCOUNTER — Encounter: Payer: Self-pay | Admitting: Internal Medicine

## 2014-06-14 ENCOUNTER — Ambulatory Visit (INDEPENDENT_AMBULATORY_CARE_PROVIDER_SITE_OTHER)
Admission: RE | Admit: 2014-06-14 | Discharge: 2014-06-14 | Disposition: A | Discharge status: Home / Self Care | Payer: BC Managed Care – PPO | Source: Ambulatory Visit | Attending: Internal Medicine | Admitting: Internal Medicine

## 2014-06-14 VITALS — BP 128/82 | HR 83 | Temp 97.8°F | Wt 232.5 lb

## 2014-06-14 DIAGNOSIS — R1032 Left lower quadrant pain: Secondary | ICD-10-CM

## 2014-06-14 DIAGNOSIS — R109 Unspecified abdominal pain: Secondary | ICD-10-CM

## 2014-06-14 DIAGNOSIS — E669 Obesity, unspecified: Secondary | ICD-10-CM

## 2014-06-14 MED ORDER — TRAMADOL HCL 50 MG PO TABS: 50.0000 mg | ORAL_TABLET | Freq: Two times a day (BID) | ORAL | Status: DC | PRN

## 2014-06-14 MED ORDER — VITAMIN D 1000 UNITS PO TABS: 1000.0000 [IU] | ORAL_TABLET | Freq: Every day | ORAL | Status: AC

## 2014-06-14 MED ORDER — NAPROXEN 500 MG PO TABS: 500.0000 mg | ORAL_TABLET | Freq: Two times a day (BID) | ORAL | Status: DC

## 2014-06-14 NOTE — Assessment & Plan Note (Addendum)
9/15 x 2 days - severe, ?etiology R/o hip AVN and other problems  Naproxen bid Tramadol prn  Potential benefits of a short/long term NSAIDs, Tramadol  use as well as potential risks  and complications were explained to the patient and were aknowledged. L hip x ray Dr Tamala Julian next week Rest

## 2014-06-14 NOTE — Progress Notes (Signed)
Pre visit review using our clinic review tool, if applicable. No additional management support is needed unless otherwise documented below in the visit note. 

## 2014-06-15 NOTE — Assessment & Plan Note (Signed)
Diet - wt loss suggested

## 2014-06-18 ENCOUNTER — Other Ambulatory Visit (INDEPENDENT_AMBULATORY_CARE_PROVIDER_SITE_OTHER): Payer: BC Managed Care – PPO

## 2014-06-18 ENCOUNTER — Ambulatory Visit (INDEPENDENT_AMBULATORY_CARE_PROVIDER_SITE_OTHER): Payer: BC Managed Care – PPO | Admitting: Family Medicine

## 2014-06-18 ENCOUNTER — Encounter: Payer: Self-pay | Admitting: Family Medicine

## 2014-06-18 VITALS — BP 130/84 | HR 73 | Ht 62.0 in | Wt 230.0 lb

## 2014-06-18 DIAGNOSIS — M25551 Pain in right hip: Secondary | ICD-10-CM

## 2014-06-18 DIAGNOSIS — M7062 Trochanteric bursitis, left hip: Secondary | ICD-10-CM | POA: Insufficient documentation

## 2014-06-18 DIAGNOSIS — M25559 Pain in unspecified hip: Secondary | ICD-10-CM

## 2014-06-18 DIAGNOSIS — M7918 Myalgia, other site: Secondary | ICD-10-CM | POA: Insufficient documentation

## 2014-06-18 DIAGNOSIS — M76899 Other specified enthesopathies of unspecified lower limb, excluding foot: Secondary | ICD-10-CM

## 2014-06-18 MED ORDER — MELOXICAM 15 MG PO TABS: 15.0000 mg | ORAL_TABLET | Freq: Every day | ORAL | Status: DC

## 2014-06-18 NOTE — Patient Instructions (Signed)
Good to meet you Ice 20 minutes 2 times daily. Usually after activity and before bed. Exercises 3 times a week. Alternate handouts  Turmeric 500mg  twice daily.  meloxicam daily for 10 days Come back in 3 weeks.

## 2014-06-18 NOTE — Progress Notes (Signed)
[name] [place],  [address] Phone: [phone] Subjective:    I'm seeing this patient by the request  of:  Plotnikov  CC: Left hip pain  [id number] Molly Bean is a 53 y.o. female coming in with complaint of left hip pain, increase over the next 2 weeks.  Patient states that she does not remember any true injury. Patient has been working much more but usually sits with working. Patient states that it is more painful when she is going from a seated to standing position or going up stairs. States that she has to sleep with a pillow between her knees otherwise it is too painful. All the pain is on the lateral aspect of the hip and denies any groin pain. States that he can radiate down the lateral aspect of the leg towards her knee. Feels like more of a tightness sensation. Can be painful to touch. Patient rates the severity of 7/10. Not responding to oral anti-inflammatories. Can wake her up at night. Denies any weakness or numbness or any associated back pain.     Past medical history, social, surgical and family history all reviewed in electronic medical record.   Review of Systems: No headache, visual changes, nausea, vomiting, diarrhea, constipation, dizziness, abdominal pain, skin rash, fevers, chills, night sweats, weight loss, swollen lymph nodes, body aches, joint swelling, muscle aches, chest pain, shortness of breath, mood changes.   Objective Blood pressure 130/84, pulse 73, height 5\' 2"  (1.575 m), weight 230 lb (104.327 kg), SpO2 96.00%.  General: No apparent distress alert and oriented x3 mood and affect normal, dressed appropriately.  HEENT: Pupils equal, extraocular movements intact  Respiratory: Patient's speak in full sentences and does not appear short of breath  Cardiovascular: No lower extremity edema, non tender, no erythema  Skin: Warm dry intact with no signs of infection or rash on extremities or on axial  skeleton.  Abdomen: Soft nontender  Neuro: Cranial nerves II through XII are intact, neurovascularly intact in all extremities with 2+ DTRs and 2+ pulses.  Lymph: No lymphadenopathy of posterior or anterior cervical chain or axillae bilaterally.  Gait normal with good balance and coordination.  MSK:  Non tender with full range of motion and good stability and symmetric strength and tone of shoulders, elbows, wrist, knee and ankles bilaterally.  Hip: Left ROM IR: 25 Deg, ER: 35 Deg, Flexion: 120 Deg, Extension: 100 Deg, Abduction: 45 Deg, Adduction: 35 Deg Strength IR: 4/5, ER: 5/5, Flexion: 5/5, Extension: 5/5, Abduction: 3+/5, Adduction: 5/5 Pelvic alignment unremarkable to inspection and palpation. Standing hip rotation and gait without trendelenburg sign / unsteadiness. Greater trochanter without tenderness to palpation. No tenderness over piriformis and greater trochanter. Positive Faber No SI joint tenderness and normal minimal SI movement. Contralateral hip unremarkable  MSK US performed of: Left hip This study was ordered, performed, and interpreted by Charlann Boxer D.O.  Hip: Significant trochanteric bursa with hypoechoic changes noted. Acetabular labrum visualized and without tears, displacement, or effusion in joint. Femoral neck appears unremarkable without increased power doppler signal along Cortex.  IMPRESSION:  Greater trochanteric bursitis   Procedure: Real-time Ultrasound Guided Injection of left greater trochanteric bursitis secondary to patient's body habitus Device: GE Logiq E  Ultrasound guided injection is preferred based studies that show increased duration, increased effect, greater accuracy, decreased procedural pain, increased response rate, and decreased cost with ultrasound guided versus blind injection.  Verbal informed consent obtained.  Time-out conducted.  Noted no  overlying erythema, induration, or other signs of local infection.  Skin prepped in a  sterile fashion.  Local anesthesia: Topical Ethyl chloride.  With sterile technique and under real time ultrasound guidance:  Greater trochanteric area was visualized and patient's bursa was noted. A 22-gauge 3 inch needle was inserted and 4 cc of 0.5% Marcaine and 1 cc of Kenalog 40 mg/dL was injected. Pictures taken Completed without difficulty  Pain immediately resolved suggesting accurate placement of the medication.  Advised to call if fevers/chills, erythema, induration, drainage, or persistent bleeding.  Images permanently stored and available for review in the ultrasound unit.  Impression: Technically successful ultrasound guided injection.     Impression and Recommendations:     This case required medical decision making of moderate complexity.

## 2014-06-18 NOTE — Assessment & Plan Note (Signed)
She was injected today with near complete resolution of pain. Patient given home exercise program, icing protocol we discussed the importance of hip abductor strengthening. We discussed the importance of core strengthening as well. We discussed over-the-counter medications and was given a prescription anti-inflammatory. Patient will come back and see me again in 3-4 weeks for further evaluation and treatment.

## 2014-07-09 ENCOUNTER — Ambulatory Visit: Payer: BC Managed Care – PPO | Admitting: Family Medicine

## 2014-07-09 DIAGNOSIS — Z0289 Encounter for other administrative examinations: Secondary | ICD-10-CM

## 2014-08-05 ENCOUNTER — Ambulatory Visit (INDEPENDENT_AMBULATORY_CARE_PROVIDER_SITE_OTHER): Payer: BC Managed Care – PPO | Admitting: Family

## 2014-08-05 ENCOUNTER — Encounter: Payer: Self-pay | Admitting: Family

## 2014-08-05 VITALS — BP 120/88 | HR 96 | Temp 97.8°F | Resp 18 | Ht 62.0 in | Wt 229.4 lb

## 2014-08-05 DIAGNOSIS — L089 Local infection of the skin and subcutaneous tissue, unspecified: Secondary | ICD-10-CM

## 2014-08-05 MED ORDER — SULFAMETHOXAZOLE-TRIMETHOPRIM 800-160 MG PO TABS: 1.0000 | ORAL_TABLET | Freq: Two times a day (BID) | ORAL | Status: DC

## 2014-08-05 MED ORDER — TRIAMTERENE-HCTZ 37.5-25 MG PO TABS: 0.5000 | ORAL_TABLET | Freq: Every day | ORAL | Status: DC

## 2014-08-05 NOTE — Progress Notes (Signed)
   Subjective:    Patient ID: Molly Bean, female    DOB: 1961-04-24, 53 y.o.   MRN: [id number]  Chief Complaint  Patient presents with  . Possible mrsa    Has a swollen, red, sore place on leg x1 week   HPI:  Molly Bean is a 53 y.o. female who presents today possible MRSA.  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 Acute symptoms of a boil on her lower left extremity started about a week ago. Has not grown - stayed about the same since it started. Has not tried any treatments. There is nothing that makes it better or worse.  Denies any recent fevers or chills.   Allergies  Allergen Reactions  . Oxycodone     Hallucinations   Current Outpatient Prescriptions on File Prior to Visit  Medication Sig Dispense Refill  . acetaminophen (TYLENOL) 500 MG tablet Take 1,000 mg by mouth every 6 (six) hours as needed for mild pain or headache.    . cholecalciferol (VITAMIN D) 1000 UNITS tablet Take 1 tablet (1,000 Units total) by mouth daily. 100 tablet 3  . diphenhydrAMINE (BENADRYL) 25 mg capsule Take 50 mg by mouth once as needed for allergies.    . meloxicam (MOBIC) 15 MG tablet Take 1 tablet (15 mg total) by mouth daily. 30 tablet 0  . naproxen (NAPROSYN) 500 MG tablet Take 1 tablet (500 mg total) by mouth 2 (two) times daily with a meal. 30 tablet 1  . traMADol (ULTRAM) 50 MG tablet Take 1-2 tablets (50-100 mg total) by mouth 2 (two) times daily as needed for moderate pain or severe pain. 60 tablet 1   No current facility-administered medications on file prior to visit.     Review of Systems    See HPI Objective:    BP 120/88 mmHg  Pulse 96  Temp(Src) 97.8 F (36.6 C) (Oral)  Resp 18  Ht 5\' 2"  (1.575 m)  Wt 229 lb 6.4 oz (104.055 kg)  BMI 41.95 kg/m2  SpO2 99% Nursing note and vital signs reviewed.  Physical Exam  Constitutional: She is oriented to person, place, and time. She appears well-developed and well-nourished. No distress.  Cardiovascular: Normal rate,  regular rhythm, normal heart sounds and intact distal pulses.   Pulmonary/Chest: Effort normal and breath sounds normal.  Neurological: She is alert and oriented to person, place, and time.  Skin: Skin is warm and dry.     Psychiatric: She has a normal mood and affect. Her behavior is normal. Judgment and thought content normal.       Assessment & Plan:

## 2014-08-05 NOTE — Assessment & Plan Note (Signed)
Skin infection symptoms and exam consistent with staph infection. Start Bactrim DS to cover for MRSA. Discussed the potential use of chlorhexidine body wash 1x a week to prevent future breakouts. Follow up if symptoms worsen or fail to improve.

## 2014-08-05 NOTE — Progress Notes (Signed)
Pre visit review using our clinic review tool, if applicable. No additional management support is needed unless otherwise documented below in the visit note. 

## 2014-08-05 NOTE — Patient Instructions (Signed)
Thank you for choosing [facility].  Summary/Instructions:   Please take the antibiotic until it is completed.   Your medication has been sent to your pharmacy   MRSA Infection MRSA stands for methicillin-resistant Staphylococcus aureus. This type of infection is caused by Staphylococcus aureus bacteria that are no longer affected by the medicines used to kill them (drug resistant). Staphylococcus (staph) bacteria are normally found on the skin or in the nose of healthy people. In most cases, these bacteria do not cause infection. But if these resistant bacteria enter your body through a cut or sore, they can cause a serious infection on your skin or in other parts of your body. There is a slight chance that the staph on your skin or in your nose is MRSA. There are two types of MRSA infections:  Hospital-acquired MRSA is bacteria that you get in the hospital.  Community-acquired MRSA is bacteria that you get somewhere other than in a hospital. RISK FACTORS Hospital-acquired MRSA is more common. You could be at risk for this infection if you are in the hospital and you:  Have surgery or a procedure.  Have an IV access or a catheter tube placed in your body.  Have weak resistance to germs (weakened immune system).  Are elderly.  Are on kidney dialysis. You could be at risk for community-acquired MRSA if you have a break in your skin and come into contact with MRSA. This may happen if you:  Play sports where there is skin-to-skin contact.  Live in a crowded setting, like a dormitory or a [employer]  Share towels, razors, or sports equipment with other people. SYMPTOMS  Symptoms of hospital-acquired MRSA depend on where MRSA has spread. Symptoms may include:  Wound infection.  Skin infection.  Rash.  Pneumonia.  Fever and chills.  Difficulty breathing.  Chest pain. Community-acquired MRSA is most likely to start as a scratch or cut that becomes  infected. Symptoms may include:  A pus-filled pimple.  A boil on your skin.  Pus draining from your skin.  A sore (abscess) under your skin or somewhere in your body.  Fever with or without chills. DIAGNOSIS  The diagnosis of MRSA is made by taking a sample from an infected area and sending it to a lab for testing. A lab technician can grow (culture) MRSA and check it under a microscope. The cultured MRSA can be tested to see which type of antibiotic medicine will work to treat it. Newer tests can identify MRSA more quickly by testing bacteria samples for MRSA genes. Your health care provider can diagnose MRSA using samples from:   Cuts or wounds in infected areas.  Nasal swabs.  Saliva or cough specimens from deep in the lungs (sputum).  Urine.  Blood. You may also have:  Imaging studies (such as X-ray or MRI) to check if the infection has spread to the lungs, bones, or joints.  A culture and sensitivity test of blood or fluids from inside the joints. TREATMENT  Treatment depends on how severe, deep, or extensive the infection is. Very bad infections may require a hospital stay.  Some skin infections, such as a small boil or sore (abscess), may be treated by draining pus from the site of the infection.  More extensive surgery to drain pus may be necessary for deeper or more widespread soft tissue infections.  You may then have to take antibiotic medicine given by mouth or through a vein. You may start antibiotic treatment right away  or after testing can be done to see what antibiotic medicine should be used. HOME CARE INSTRUCTIONS   Take your antibiotics as directed by your health care provider. Take the medicine as prescribed until it is finished.  Avoid close contact with those around you as much as possible. Do not use towels, razors, toothbrushes, bedding, or other items that will be used by others.  Wash your hands frequently for 15 seconds with soap and water. Dry  your hands with a clean or disposable towel.  When you are not able to wash your hands, use hand sanitizer that is more than 60 percent alcohol.  Wash towels, sheets, or clothes in the washing machine with detergent and hot water. Dry them in a hot dryer.  Follow your health care provider's instructions for wound care. Wash your hands before and after changing your bandages.  Always shower after exercising.  Keep all cuts and scrapes clean and covered with a bandage.  Be sure to tell all your health care providers that you have MRSA so they are aware of your infection. SEEK MEDICAL CARE IF:  You have a cut, scrape, pimple, or boil that becomes red, swollen, or painful or has pus in it.  You have pus draining from your skin.  You have an abscess under your skin or somewhere in your body. SEEK IMMEDIATE MEDICAL CARE IF:   You have symptoms of a skin infection with a fever or chills.  You have trouble breathing.  You have chest pain.  You have a skin wound and you become nauseous or start vomiting. MAKE SURE YOU:  Understand these instructions.  Will watch your condition.  Will get help right away if you are not doing well or get worse. Document Released: 09/20/2005 Document Revised: 09/25/2013 Document Reviewed: 07/13/2013 [facility] Patient Information 2015 [place], [place]. This information is not intended to replace advice given to you by your health care provider. Make sure you discuss any questions you have with your health care provider.

## 2014-09-16 ENCOUNTER — Inpatient Hospital Stay: Payer: BC Managed Care – PPO | Admitting: Family

## 2014-09-23 ENCOUNTER — Ambulatory Visit (INDEPENDENT_AMBULATORY_CARE_PROVIDER_SITE_OTHER): Payer: BC Managed Care – PPO | Admitting: Family

## 2014-09-23 ENCOUNTER — Encounter: Payer: Self-pay | Admitting: Family

## 2014-09-23 VITALS — BP 132/88 | HR 68 | Temp 97.9°F | Resp 18 | Ht 62.0 in | Wt 232.0 lb

## 2014-09-23 DIAGNOSIS — Z91013 Allergy to seafood: Secondary | ICD-10-CM

## 2014-09-23 DIAGNOSIS — Z0182 Encounter for allergy testing: Secondary | ICD-10-CM

## 2014-09-23 MED ORDER — ALBUTEROL SULFATE HFA 108 (90 BASE) MCG/ACT IN AERS: 2.0000 | INHALATION_SPRAY | Freq: Four times a day (QID) | RESPIRATORY_TRACT | Status: DC | PRN

## 2014-09-23 MED ORDER — EPINEPHRINE 0.3 MG/0.3ML IJ SOAJ: 0.3000 mg | Freq: Once | INTRAMUSCULAR | Status: DC

## 2014-09-23 NOTE — Patient Instructions (Signed)
Thank you for choosing [facility].  Summary/Instructions:  Your prescription(s) have been submitted to your pharmacy. Please take as directed and contact our office if you believe you are having problem(s) with the medication(s).  Anaphylactic Reaction An anaphylactic reaction is a sudden, severe allergic reaction that involves the whole body. It can be life threatening. A hospital stay is often required. People with asthma, eczema, or hay fever are slightly more likely to have an anaphylactic reaction. CAUSES  An anaphylactic reaction may be caused by anything to which you are allergic. After being exposed to the allergic substance, your immune system becomes sensitized to it. When you are exposed to that allergic substance again, an allergic reaction can occur. Common causes of an anaphylactic reaction include:  Medicines.  Foods, especially peanuts, wheat, shellfish, milk, and eggs.  Insect bites or stings.  Blood products.  Chemicals, such as dyes, latex, and contrast material used for imaging tests. SYMPTOMS  When an allergic reaction occurs, the body releases histamine and other substances. These substances cause symptoms such as tightening of the airway. Symptoms often develop within seconds or minutes of exposure. Symptoms may include:  Skin rash or hives.  Itching.  Chest tightness.  Swelling of the eyes, tongue, or lips.  Trouble breathing or swallowing.  Lightheadedness or fainting.  Anxiety or confusion.  Stomach pains, vomiting, or diarrhea.  Nasal congestion.  A fast or irregular heartbeat (palpitations). DIAGNOSIS  Diagnosis is based on your history of recent exposure to allergic substances, your symptoms, and a physical exam. Your caregiver may also perform blood or urine tests to confirm the diagnosis. TREATMENT  Epinephrine medicine is the main treatment for an anaphylactic reaction. Other medicines that may be used for treatment include  antihistamines, steroids, and albuterol. In severe cases, fluids and medicine to support blood pressure may be given through an intravenous line (IV). Even if you improve after treatment, you need to be observed to make sure your condition does not get worse. This may require a stay in the hospital. [place] a medical alert bracelet or necklace stating your allergy.  You and your family must learn how to use an anaphylaxis kit or give an epinephrine injection to temporarily treat an emergency allergic reaction. Always carry your epinephrine injection or anaphylaxis kit with you. This can be lifesaving if you have a severe reaction.  Do not drive or perform tasks after treatment until the medicines used to treat your reaction have worn off, or until your caregiver says it is okay.  If you have hives or a rash:  Take medicines as directed by your caregiver.  You may use an over-the-counter antihistamine (diphenhydramine) as needed.  Apply cold compresses to the skin or take baths in cool water. Avoid hot baths or showers. SEEK MEDICAL CARE IF:   You develop symptoms of an allergic reaction to a new substance. Symptoms may start right away or minutes later.  You develop a rash, hives, or itching.  You develop new symptoms. SEEK IMMEDIATE MEDICAL CARE IF:   You have swelling of the mouth, difficulty breathing, or wheezing.  You have a tight feeling in your chest or throat.  You develop hives, swelling, or itching all over your body.  You develop severe vomiting or diarrhea.  You feel faint or pass out. This is an emergency. Use your epinephrine injection or anaphylaxis kit as you have been instructed. Call your local emergency services (911 in [place] Even if you improve  after the injection, you need to be examined at a hospital emergency department. MAKE SURE YOU:   Understand these instructions.  Will watch your condition.  Will get help right away if you  are not doing well or get worse. Document Released: 09/20/2005 Document Revised: 09/25/2013 Document Reviewed: 12/22/2011 [facility] Patient Information 2015 [place], [place]. This information is not intended to replace advice given to you by your health care provider. Make sure you discuss any questions you have with your health care provider.

## 2014-09-23 NOTE — Assessment & Plan Note (Signed)
Cannot rule out shellfish allergy. Refer to allergy clinic for further testing. Start EpiPen as needed for anaphylaxis. Patient shocked EpiPen usage and signs and symptoms of anaphylaxis. Follow up as needed and following allergy testing.

## 2014-09-23 NOTE — Progress Notes (Signed)
   Subjective:    Patient ID: Molly Bean, female    DOB: 1961/01/25, 53 y.o.   MRN: [id number]  Chief Complaint  Patient presents with  . Follow-up    allergic reaction to oysters   HPI:  Molly Bean is a 53 y.o. female who presents today for follow up.   In June of this year patient was seen in the emergency room for an anaphylactic reaction to shellfish. She indicates that she had oysters that evening and when she arrived home she was "feeling different" and having difficulty with breathing. At that time she noted some neck swelling and potentially angioedema. She attempted sitting up which provided her no relief. EMS was called and gave her an EpiPen injection and started an IV. She was treated and released with no complications. All other emergency room documentation was reviewed in detail.  She presents today having not had seafood since the initial incident in June. She is requesting allergy testing to rule out any potential other allergens that may cause anaphylaxis. She denies any anaphylactic reactions or allergic reactions since June. Today she indicates she is feeling well. She does note that her grandchildren do have an allergic reaction to peanuts and shellfish.  Allergies  Allergen Reactions  . Oxycodone     Hallucinations  . Shellfish Allergy    Current Outpatient Prescriptions on File Prior to Visit  Medication Sig Dispense Refill  . acetaminophen (TYLENOL) 500 MG tablet Take 1,000 mg by mouth every 6 (six) hours as needed for mild pain or headache.    . cholecalciferol (VITAMIN D) 1000 UNITS tablet Take 1 tablet (1,000 Units total) by mouth daily. 100 tablet 3  . diphenhydrAMINE (BENADRYL) 25 mg capsule Take 50 mg by mouth once as needed for allergies.    . meloxicam (MOBIC) 15 MG tablet Take 1 tablet (15 mg total) by mouth daily. 30 tablet 0  . naproxen (NAPROSYN) 500 MG tablet Take 1 tablet (500 mg total) by mouth 2 (two) times daily with a meal. 30 tablet 1    . sulfamethoxazole-trimethoprim (BACTRIM DS,SEPTRA DS) 800-160 MG per tablet Take 1 tablet by mouth 2 (two) times daily. 14 tablet 0  . traMADol (ULTRAM) 50 MG tablet Take 1-2 tablets (50-100 mg total) by mouth 2 (two) times daily as needed for moderate pain or severe pain. 60 tablet 1  . triamterene-hydrochlorothiazide (MAXZIDE-25) 37.5-25 MG per tablet Take 0.5-1 tablets by mouth daily. 30 tablet 3   No current facility-administered medications on file prior to visit.    Review of Systems    See HPI  Objective:    BP 132/88 mmHg  Pulse 68  Temp(Src) 97.9 F (36.6 C) (Oral)  Resp 18  Ht 5\' 2"  (1.575 m)  Wt 232 lb (105.235 kg)  BMI 42.42 kg/m2  SpO2 98% Nursing note and vital signs reviewed.  Physical Exam  Constitutional: She is oriented to person, place, and time. She appears well-developed and well-nourished. No distress.  Cardiovascular: Normal rate, regular rhythm, normal heart sounds and intact distal pulses.   Pulmonary/Chest: Effort normal and breath sounds normal.  Neurological: She is alert and oriented to person, place, and time.  Skin: Skin is warm and dry.  Psychiatric: She has a normal mood and affect. Her behavior is normal. Judgment and thought content normal.       Assessment & Plan:

## 2014-09-23 NOTE — Progress Notes (Signed)
Pre visit review using our clinic review tool, if applicable. No additional management support is needed unless otherwise documented below in the visit note. 

## 2014-10-23 ENCOUNTER — Encounter: Payer: Self-pay | Admitting: Family

## 2014-11-18 ENCOUNTER — Encounter: Payer: Self-pay | Admitting: Internal Medicine

## 2014-11-21 ENCOUNTER — Encounter: Payer: Self-pay | Admitting: Internal Medicine

## 2014-11-21 ENCOUNTER — Ambulatory Visit (INDEPENDENT_AMBULATORY_CARE_PROVIDER_SITE_OTHER): Payer: BC Managed Care – PPO | Admitting: Internal Medicine

## 2014-11-21 ENCOUNTER — Other Ambulatory Visit (INDEPENDENT_AMBULATORY_CARE_PROVIDER_SITE_OTHER): Payer: BC Managed Care – PPO

## 2014-11-21 VITALS — BP 124/78 | HR 77 | Ht 62.0 in | Wt 232.6 lb

## 2014-11-21 DIAGNOSIS — Z91013 Allergy to seafood: Secondary | ICD-10-CM

## 2014-11-21 DIAGNOSIS — Z91018 Allergy to other foods: Secondary | ICD-10-CM

## 2014-11-21 LAB — CBC WITH DIFFERENTIAL/PLATELET
BASOS PCT: 0.2 % (ref 0.0–3.0)
Basophils Absolute: 0 10*3/uL (ref 0.0–0.1)
Eosinophils Absolute: 0 10*3/uL (ref 0.0–0.7)
Eosinophils Relative: 0.6 % (ref 0.0–5.0)
HCT: 39 % (ref 36.0–46.0)
HEMOGLOBIN: 12.4 g/dL (ref 12.0–15.0)
Lymphocytes Relative: 32.3 % (ref 12.0–46.0)
Lymphs Abs: 2.1 10*3/uL (ref 0.7–4.0)
MCHC: 31.9 g/dL (ref 30.0–36.0)
MCV: 66.9 fl — AB (ref 78.0–100.0)
Monocytes Absolute: 0.4 10*3/uL (ref 0.1–1.0)
Monocytes Relative: 5.4 % (ref 3.0–12.0)
NEUTROS ABS: 4 10*3/uL (ref 1.4–7.7)
Neutrophils Relative %: 61.5 % (ref 43.0–77.0)
Platelets: 223 10*3/uL (ref 150.0–400.0)
RBC: 5.83 Mil/uL — AB (ref 3.87–5.11)
RDW: 16.5 % — ABNORMAL HIGH (ref 11.5–15.5)
WBC: 6.5 10*3/uL (ref 4.0–10.5)

## 2014-11-21 MED ORDER — EPINEPHRINE 0.3 MG/0.3ML IJ SOAJ: 0.3000 mg | Freq: Once | INTRAMUSCULAR | Status: DC

## 2014-11-21 NOTE — Patient Instructions (Signed)
Order- lab- Allergy profile, Food IgE profile, alpha-Gal IgE, oyster, shellfish, cbc w diff  Avoid foods that clearly seem to cause problems  Consider pre-treating yourself with an antihistamine like claritin, allegra or zyrtec an hour or so before you go out to eat. It won't completely protect you if you accidentally eat something, but it may help.

## 2014-11-21 NOTE — Assessment & Plan Note (Signed)
Throat discomfort she describes was longer after ingestion than is usually expected with food allergies. 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 I explained that we no longer skin test for foods here but can learn most of what we need from [facility] in vitro testing. I offered to let her cancel this visit and see one of the larger allergy offices. She chose to proceed with in vitro testing. I also discussed postnasal drainage, reflux and snoring (possibility of sleep apnea) with symptoms potentially overlapping what she noticed. Plan-lab for allergy profiles including alpha-gal, oyster and seafood IgE, and CBC with differential to look at eosinophil count. We retrained her for Epipen use with discussion.

## 2014-11-21 NOTE — Progress Notes (Signed)
11/21/14- 19 yoF former smoker referred courtesy of Terri Piedra, NP-wants skin testing; was told that testing could be done today. June, 2015 ate oysters- angioedema and difficulty breathing- treated at ER. Had eaten fried oysters at a restaurant. Went home, then several hours later while lying down, felt that throat was tight. Has eaten oysters many times previously without problem.. Pineapple makes mouth itch, cucumber lettuce and onions cause indigestion. She denies reflux, history of allergic skin rashes or asthma. Not sensitive to aspirin or latex. Does office work Arboriculturist. Only occasional food exposure there with no problems.  Prior to Admission medications   Medication Sig Start Date End Date Taking? Authorizing Provider  acetaminophen (TYLENOL) 500 MG tablet Take 1,000 mg by mouth every 6 (six) hours as needed for mild pain or headache.   Yes Historical Provider, MD  albuterol (PROVENTIL HFA;VENTOLIN HFA) 108 (90 BASE) MCG/ACT inhaler Inhale 2 puffs into the lungs every 6 (six) hours as needed for wheezing or shortness of breath. 09/23/14  Yes Mauricio Po, FNP  cholecalciferol (VITAMIN D) 1000 UNITS tablet Take 1 tablet (1,000 Units total) by mouth daily. 06/14/14 06/14/15 Yes Aleksei Plotnikov V, MD  diphenhydrAMINE (BENADRYL) 25 mg capsule Take 50 mg by mouth once as needed for allergies.   Yes Historical Provider, MD  naproxen (NAPROSYN) 500 MG tablet Take 1 tablet (500 mg total) by mouth 2 (two) times daily with a meal. 06/14/14  Yes Aleksei Plotnikov V, MD  traMADol (ULTRAM) 50 MG tablet Take 1-2 tablets (50-100 mg total) by mouth 2 (two) times daily as needed for moderate pain or severe pain. 06/14/14  Yes Aleksei Plotnikov V, MD  triamterene-hydrochlorothiazide (MAXZIDE-25) 37.5-25 MG per tablet Take 0.5-1 tablets by mouth daily. 08/05/14  Yes Mauricio Po, FNP  EPINEPHrine (EPIPEN 2-PAK) 0.3 mg/0.3 mL IJ SOAJ injection Inject 0.3 mLs (0.3 mg total) into the muscle once.  11/21/14   Deneise Lever, MD   Past Medical History  Diagnosis Date  . OBESITY   . UTI'S, CHRONIC   . MENOPAUSAL SYNDROME   . Edema   . DEPRESSION   . GERD    Past Surgical History  Procedure Laterality Date  . Cesarean section    . Abdominal hysterectomy      total fibroids   ROS-see HPI Constitutional:   No-   weight loss, night sweats, fevers, chills, fatigue, lassitude. HEENT:   No-  headaches, difficulty swallowing, tooth/dental problems, sore throat,       No-  sneezing, itching, ear ache, nasal congestion, post nasal drip,  CV:  No-   chest pain, orthopnea, PND, swelling in lower extremities, anasarca,                                  dizziness, palpitations Resp: No-   shortness of breath with exertion or at rest.              No-   productive cough,  No non-productive cough,  No- coughing up of blood.              No-   change in color of mucus.  No- wheezing.  + Snores Skin: No-   rash or lesions. GI:  No-   heartburn, indigestion, abdominal pain, nausea, vomiting, diarrhea,                 change in bowel habits, loss of appetite GU: No-  dysuria, change in color of urine, no urgency or frequency.  No- flank pain. MS:  No-   joint pain or swelling.  No- decreased range of motion.  No- back pain. Neuro-     nothing unusual Psych:  No- change in mood or affect. No depression or anxiety.  No memory loss.  OBJ- Physical Exam General- Alert, Oriented, Affect-appropriate, Distress- none acute Skin- rash-none, lesions- none, excoriation- none Lymphadenopathy- none Head- atraumatic            Eyes- Gross vision intact, PERRLA, conjunctivae and secretions clear            Ears- Hearing, canals-normal            Nose- Clear, no-Septal dev, mucus, polyps, erosion, perforation             Throat- Mallampati IV , mucosa clear , drainage- none, tonsils- atrophic Neck- flexible , trachea midline, no stridor , thyroid nl, carotid no bruit Chest - symmetrical excursion ,  unlabored           Heart/CV- RRR , no murmur , no gallop  , no rub, nl s1 s2                           - JVD- none , edema- none, stasis changes- none, varices- none           Lung- clear to P&A, wheeze- none, cough- none , dullness-none, rub- none           Chest wall-  Abd- tender-no, distended-no, bowel sounds-present, HSM- no Br/ Gen/ Rectal- Not done, not indicated Extrem- cyanosis- none, clubbing, none, atrophy- none, strength- nl Neuro- grossly intact to observation

## 2014-11-22 LAB — ALLERGY FULL PROFILE
Allergen, D pternoyssinus,d7: <0.1 kU/L
Allergen,Goose feathers, e70: <0.1 kU/L
Alternaria Alternata: <0.1 kU/L
Aspergillus fumigatus, m3: <0.1 kU/L
Bahia Grass: <0.1 kU/L
Bermuda Grass: <0.1 kU/L
Box Elder IgE: <0.1 kU/L
Candida Albicans: <0.1 kU/L
Cat Dander: <0.1 kU/L
Common Ragweed: <0.1 kU/L
Curvularia lunata: <0.1 kU/L
D. farinae: <0.1 kU/L
Dog Dander: <0.1 kU/L
Elm IgE: <0.1 kU/L
Fescue: <0.1 kU/L
G005 Rye, Perennial: <0.1 kU/L
G009 Red Top: <0.1 kU/L
Goldenrod: <0.1 kU/L
Helminthosporium halodes: <0.1 kU/L
House Dust Hollister: <0.1 kU/L
Lamb's Quarters: <0.1 kU/L
Oak: <0.1 kU/L
Plantain: <0.1 kU/L
Stemphylium Botryosum: <0.1 kU/L
Sycamore Tree: <0.1 kU/L
Timothy Grass: <0.1 kU/L

## 2014-11-22 LAB — ALLERGEN SEAFOOD PANEL
Clams: <0.1 kU/L
Crab: <0.1 kU/L
Crayfish: <0.1 kU/L
Lobster: <0.1 kU/L
Oyster: <0.1 kU/L
Scallop IgE: <0.1 kU/L
Shrimp IgE: <0.1 kU/L
Tuna IgE: <0.1 kU/L

## 2014-11-22 LAB — ALLERGEN FOOD PROFILE SPECIFIC IGE
Apple: <0.1 kU/L
Chicken IgE: <0.1 kU/L
Egg White IgE: <0.1 kU/L
Fish Cod: <0.1 kU/L
IgE (Immunoglobulin E), Serum: 32 kU/L (ref <115)
Milk IgE: <0.1 kU/L
Shrimp IgE: <0.1 kU/L
Tomato IgE: <0.1 kU/L
Tuna IgE: <0.1 kU/L
Wheat IgE: <0.1 kU/L

## 2014-11-26 LAB — ALPHA GAL IGE: Alpha Gal IgE*: <0.1 kU/L (ref <0.35)

## 2015-04-24 ENCOUNTER — Encounter: Payer: Self-pay | Admitting: Gastroenterology

## 2015-11-03 ENCOUNTER — Ambulatory Visit (INDEPENDENT_AMBULATORY_CARE_PROVIDER_SITE_OTHER): Payer: BC Managed Care – PPO | Admitting: Physician Assistant

## 2015-11-03 VITALS — BP 118/82 | HR 75 | Temp 98.5°F | Resp 18 | Wt 232.6 lb

## 2015-11-03 DIAGNOSIS — R51 Headache: Secondary | ICD-10-CM

## 2015-11-03 DIAGNOSIS — E860 Dehydration: Secondary | ICD-10-CM | POA: Diagnosis not present

## 2015-11-03 DIAGNOSIS — R519 Headache, unspecified: Secondary | ICD-10-CM

## 2015-11-03 LAB — POCT URINALYSIS DIP (MANUAL ENTRY)
Bilirubin, UA: NEGATIVE
Glucose, UA: NEGATIVE
LEUKOCYTES UA: NEGATIVE
Nitrite, UA: NEGATIVE
PROTEIN UA: NEGATIVE
RBC UA: NEGATIVE
Spec Grav, UA: 1.025
Urobilinogen, UA: 0.2
pH, UA: 5.5

## 2015-11-03 LAB — POC MICROSCOPIC URINALYSIS (UMFC)

## 2015-11-03 MED ORDER — KETOROLAC TROMETHAMINE 60 MG/2ML IM SOLN
60.0000 mg | Freq: Once | INTRAMUSCULAR | Status: AC
  Administered 2015-11-03: 60 mg via INTRAMUSCULAR

## 2015-11-03 MED ORDER — IBUPROFEN 800 MG PO TABS
ORAL_TABLET | ORAL | Status: DC
Start: 2015-11-03 — End: 2018-01-18

## 2015-11-03 NOTE — Progress Notes (Signed)
11/03/2015 2:27 PM   DOB: 1961/01/11 / MRN: [id number]  SUBJECTIVE:  Molly Bean is a 55 y.o. female presenting for HA.  Has a history of chronic UTI per [facility]. Reports the HA has been present for 1 week and is bilateral and temporal.  Reports that her vision is "off."  Complains of neck pain as well.  Denies any numbness, weakness, tingling in her extremities.  She has tried Ibuprofen 800 mg without relief, and reports this did help.    She is allergic to oxycodone and shellfish allergy.   She  has a past medical history of OBESITY; UTI'S, CHRONIC; MENOPAUSAL SYNDROME; Edema; DEPRESSION; and GERD.    She  reports that she has quit smoking. Her smoking use included Cigarettes. She has a 6 pack-year smoking history. She quit smokeless tobacco use about 28 years ago. She reports that she does not drink alcohol or use illicit drugs. She  has no sexual activity history on file. The patient  has past surgical history that includes Cesarean section and Abdominal hysterectomy.  Her family history includes Atrial fibrillation in her mother; Breast cancer (age of onset: 12) in her sister; Coronary artery disease in her mother; Diabetes in her mother; Hypertension in her mother; Lupus in her cousin; Seizures in her father.  Review of Systems  Constitutional: Negative for fever.  Eyes: Negative for photophobia, pain and redness.  Cardiovascular: Negative for chest pain.  Gastrointestinal: Negative for nausea.  Genitourinary: Negative for dysuria, urgency and frequency.  Musculoskeletal: Positive for neck pain.  Skin: Negative for rash.  Neurological: Positive for headaches.    Problem list and medications reviewed and updated by myself where necessary, and exist elsewhere in the encounter.   OBJECTIVE:  BP 118/82 mmHg  Pulse 75  Temp(Src) 98.5 F (36.9 C) (Oral)  Resp 18  Wt 232 lb 9.6 oz (105.507 kg)  SpO2 97%  Physical Exam  Constitutional: She is oriented to person, place, and time.  She appears well-developed.  Eyes: EOM are normal. Pupils are equal, round, and reactive to light.  Cardiovascular: Normal rate.   Pulmonary/Chest: Effort normal.  Abdominal: She exhibits no distension.  Musculoskeletal: Normal range of motion.  Neurological: She is alert and oriented to person, place, and time. She has normal strength and normal reflexes. She displays no atrophy and no tremor. No cranial nerve deficit or sensory deficit. She exhibits normal muscle tone. She displays no seizure activity. Coordination and gait normal. She displays no Babinski's sign on the right side. She displays no Babinski's sign on the left side.  Skin: Skin is warm and dry. She is not diaphoretic.  Psychiatric: She has a normal mood and affect.  Vitals reviewed.   Results for orders placed or performed in visit on 11/03/15 (from the past 72 hour(s))  POCT urinalysis dipstick     Status: Abnormal   Collection Time: 11/03/15  2:08 PM  Result Value Ref Range   Color, UA yellow yellow   Clarity, UA clear clear   Glucose, UA negative negative   Bilirubin, UA negative negative   Ketones, POC UA trace (5) (A) negative   Spec Grav, UA 1.025    Blood, UA negative negative   pH, UA 5.5    Protein Ur, POC negative negative   Urobilinogen, UA 0.2    Nitrite, UA Negative Negative   Leukocytes, UA Negative Negative  POCT Microscopic Urinalysis ([facility])     Status: Abnormal   Collection Time: 11/03/15  2:15 PM  Result Value Ref Range   WBC,UR,HPF,POC None None WBC/hpf   RBC,UR,HPF,POC None None RBC/hpf   Bacteria Few (A) None, Too numerous to count   Mucus Present (A) Absent   Epithelial Cells, UR Per Microscopy Few (A) None, Too numerous to count cells/hpf    No results found.  ASSESSMENT AND PLAN  [name] was seen today for migraine.  Diagnoses and all orders for this visit:  Acute nonintractable headache, unspecified headache type: Neuro exam intact and reassuring.   -     POCT urinalysis  dipstick -     POCT Microscopic Urinalysis ([facility]) -     ketorolac (TORADOL) injection 60 mg; Inject 2 mLs (60 mg total) into the muscle once.  Dehydration: Offered patient IV and Toradol.  She was concerned with the amount of time the IV would take and has opted for Toradol.  Reports she will go home and orally hydrate.  Will call if her symptoms change or if her HA does not improve with conservative therapy.     The patient was advised to call or return to clinic if she does not see an improvement in symptoms or to seek the care of the closest emergency department if she worsens with the above plan.   Philis Fendt, MHS, PA-C [facility] 11/03/2015 2:27 PM

## 2015-11-05 ENCOUNTER — Ambulatory Visit: Payer: BC Managed Care – PPO | Admitting: Internal Medicine

## 2015-11-07 LAB — HM MAMMOGRAPHY: HM MAMMO: NEGATIVE

## 2015-11-10 ENCOUNTER — Encounter: Payer: Self-pay | Admitting: Family

## 2016-04-30 ENCOUNTER — Ambulatory Visit (INDEPENDENT_AMBULATORY_CARE_PROVIDER_SITE_OTHER): Payer: BC Managed Care – PPO | Admitting: Family Medicine

## 2016-04-30 ENCOUNTER — Telehealth: Payer: Self-pay | Admitting: Family

## 2016-04-30 VITALS — BP 124/87 | HR 85 | Temp 99.3°F | Ht 62.0 in | Wt 227.0 lb

## 2016-04-30 DIAGNOSIS — M940 Chondrocostal junction syndrome [Tietze]: Secondary | ICD-10-CM

## 2016-04-30 MED ORDER — TRIAMTERENE-HCTZ 37.5-25 MG PO TABS: 0.5000 | ORAL_TABLET | Freq: Every day | ORAL | 0 refills | Status: DC

## 2016-04-30 MED ORDER — METHYLPREDNISOLONE 4 MG PO TBPK: ORAL_TABLET | ORAL | 0 refills | Status: DC

## 2016-04-30 MED ORDER — EPINEPHRINE 0.3 MG/0.3ML IJ SOAJ: 0.3000 mg | Freq: Once | INTRAMUSCULAR | 0 refills | Status: DC

## 2016-04-30 NOTE — Telephone Encounter (Signed)
Patient Name: Molly Bean  DOB: 1961-08-30    Initial Comment caller states she has soreness in left arm and hurts to take a deep breath   Nurse Assessment  Nurse: Mallie Mussel, RN, Alveta Heimlich Date/Time Eilene Ghazi Time): 04/30/2016 9:08:27 AM  Confirm and document reason for call. If symptomatic, describe symptoms. You must click the next button to save text entered. ---Caller states that she has soreness in her left arm which began Sunday or Monday. She rates her soreness pain as 5-6 on 0-10 scale. She began to have pain with deep breathing last night. It has continued to get worse. She denies SOB. She feels like her muscles are "tightening up from her chest up and around her neck also in the front of her neck".  Has the patient traveled out of the country within the last 30 days? ---No  Does the patient have any new or worsening symptoms? ---Yes  Will a triage be completed? ---Yes  Related visit to physician within the last 2 weeks? ---No  Does the PT have any chronic conditions? (i.e. diabetes, asthma, etc.) ---No  Is this a behavioral health or substance abuse call? ---No     Guidelines    Guideline Title Affirmed Question Affirmed Notes  Breathing Difficulty [1] MILD difficulty breathing (e.g., minimal/no SOB at rest, SOB with walking, pulse <100) AND [2] NEW-onset or WORSE than normal    Final Disposition User   See Physician within 4 Hours (or PCP triage) Mallie Mussel, RN, Alveta Heimlich    Comments  No appointments at [facility] does not have an appointment available in the recommended time frame. I scheduled her with Dr. Alysia Penna at [facility] at 1:30pm.   Referrals  REFERRED TO PCP OFFICE   Disagree/Comply: Comply

## 2016-04-30 NOTE — Progress Notes (Signed)
Pre visit review using our clinic review tool, if applicable. No additional management support is needed unless otherwise documented below in the visit note. 

## 2016-04-30 NOTE — Progress Notes (Signed)
   Subjective:    Patient ID: Molly Bean, female    DOB: 1961/05/29, 55 y.o.   MRN: [id number]  HPI Here for 3 days of low grade fever and sharp pains in the chest with movement or taking a deep breath. No SOB or cough. No ST or sinus congestion.    Review of Systems  Constitutional: Positive for fever.  HENT: Negative.   Eyes: Negative.   Respiratory: Negative.   Cardiovascular: Positive for chest pain. Negative for palpitations and leg swelling.  Neurological: Negative.        Objective:   Physical Exam  Constitutional: She appears well-developed and well-nourished. No distress.  HENT:  Right Ear: External ear normal.  Left Ear: External ear normal.  Nose: Nose normal.  Mouth/Throat: Oropharynx is clear and moist.  Eyes: Conjunctivae are normal.  Neck: No thyromegaly present.  Cardiovascular: Normal rate, regular rhythm, normal heart sounds and intact distal pulses.   No murmur heard. Pulmonary/Chest: Effort normal and breath sounds normal. No respiratory distress. She has no wheezes. She has no rales.  She is tender over the left sternal margin   Lymphadenopathy:    She has no cervical adenopathy.          Assessment & Plan:  Costochondritis, probably the result of a viral URI. Given a Medrol dose pack. Rest and drink fluids.

## 2016-06-09 ENCOUNTER — Ambulatory Visit (INDEPENDENT_AMBULATORY_CARE_PROVIDER_SITE_OTHER)
Admission: RE | Admit: 2016-06-09 | Discharge: 2016-06-09 | Disposition: A | Discharge status: Home / Self Care | Payer: BC Managed Care – PPO | Source: Ambulatory Visit | Attending: Family | Admitting: Family

## 2016-06-09 ENCOUNTER — Ambulatory Visit (INDEPENDENT_AMBULATORY_CARE_PROVIDER_SITE_OTHER): Payer: BC Managed Care – PPO | Admitting: Family

## 2016-06-09 ENCOUNTER — Encounter: Payer: Self-pay | Admitting: Family

## 2016-06-09 DIAGNOSIS — M25512 Pain in left shoulder: Secondary | ICD-10-CM | POA: Diagnosis not present

## 2016-06-09 DIAGNOSIS — M25519 Pain in unspecified shoulder: Secondary | ICD-10-CM

## 2016-06-09 DIAGNOSIS — G8929 Other chronic pain: Secondary | ICD-10-CM | POA: Insufficient documentation

## 2016-06-09 MED ORDER — TRAMADOL HCL 50 MG PO TABS: 50.0000 mg | ORAL_TABLET | Freq: Two times a day (BID) | ORAL | 1 refills | Status: DC | PRN

## 2016-06-09 MED ORDER — IBUPROFEN-FAMOTIDINE 800-26.6 MG PO TABS: 1.0000 | ORAL_TABLET | Freq: Three times a day (TID) | ORAL | 1 refills | Status: DC | PRN

## 2016-06-09 MED ORDER — DICLOFENAC SODIUM 2 % TD SOLN: 1.0000 "application " | Freq: Two times a day (BID) | TRANSDERMAL | 1 refills | Status: DC | PRN

## 2016-06-09 MED ORDER — PREDNISONE 10 MG (21) PO TBPK: ORAL_TABLET | ORAL | 0 refills | Status: DC

## 2016-06-09 NOTE — Progress Notes (Signed)
Subjective:    Patient ID: Molly Bean, female    DOB: Aug 06, 1961, 55 y.o.   MRN: [id number]  Chief Complaint  Patient presents with  . Shoulder Pain    left shoulder pain, so much that she can not lay on it, has very limited ROM, does not know what she did to cause it    HPI:  Molly Bean is a 55 y.o. female who  has a past medical history of DEPRESSION; Edema; GERD; MENOPAUSAL SYNDROME; OBESITY; and UTI'S, CHRONIC. and presents today for an office visit.  This is a new problem. Associated symptom of pain located in her left shoulder has been going on for about 1 month and has been worsening since initial onset. Pain is described as constant and sharp. Unable to lay on it and has limited range of motion. Unable to lift her arm above about 90 degrees limiting her activities of daily living. No trauma or injury that she can recall. Modifying factors include ibuprofen which does not help with her symptoms. She is right hand dominant. No numbness or tingling. Endorses some mild neck discomfort.    Allergies  Allergen Reactions  . Oxycodone     Hallucinations  . Shellfish Allergy       Outpatient Medications Prior to Visit  Medication Sig Dispense Refill  . acetaminophen (TYLENOL) 500 MG tablet Take 1,000 mg by mouth every 6 (six) hours as needed for mild pain or headache.    . albuterol (PROVENTIL HFA;VENTOLIN HFA) 108 (90 BASE) MCG/ACT inhaler Inhale 2 puffs into the lungs every 6 (six) hours as needed for wheezing or shortness of breath. 1 Inhaler 3  . diphenhydrAMINE (BENADRYL) 25 mg capsule Take 50 mg by mouth once as needed for allergies. Reported on 11/03/2015    . ibuprofen (ADVIL,MOTRIN) 800 MG tablet Take one at the onset of HA. 30 tablet 1  . triamterene-hydrochlorothiazide (MAXZIDE-25) 37.5-25 MG tablet Take 0.5-1 tablets by mouth daily. 30 tablet 0  . methylPREDNISolone (MEDROL DOSEPAK) 4 MG TBPK tablet As directed 21 tablet 0  . naproxen (NAPROSYN) 500 MG tablet  Take 1 tablet (500 mg total) by mouth 2 (two) times daily with a meal. 30 tablet 1  . traMADol (ULTRAM) 50 MG tablet Take 1-2 tablets (50-100 mg total) by mouth 2 (two) times daily as needed for moderate pain or severe pain. 60 tablet 1   No facility-administered medications prior to visit.       Past Surgical History:  Procedure Laterality Date  . ABDOMINAL HYSTERECTOMY     total fibroids  . CESAREAN SECTION        Past Medical History:  Diagnosis Date  . DEPRESSION   . Edema   . GERD   . MENOPAUSAL SYNDROME   . OBESITY   . UTI'S, CHRONIC       Review of Systems  Constitutional: Negative for chills and fever.  Respiratory: Negative for chest tightness and shortness of breath.   Cardiovascular: Negative for chest pain, palpitations and leg swelling.  Musculoskeletal:       Positive for left shoulder pain  Neurological: Negative for weakness and numbness.      Objective:    BP 108/72 (BP Location: Left Arm, Patient Position: Sitting, Cuff Size: Large)   Pulse 85   Temp 98.6 F (37 C) (Oral)   Resp 16   Ht 5\' 2"  (1.575 m)   Wt 227 lb (103 kg)   SpO2 97%   BMI 41.52 kg/m  Nursing note and vital signs reviewed.  Physical Exam  Constitutional: She is oriented to person, place, and time. She appears well-developed and well-nourished. No distress.  Cardiovascular: Normal rate, regular rhythm, normal heart sounds and intact distal pulses.   Pulmonary/Chest: Effort normal and breath sounds normal.  Musculoskeletal:  Left shoulder - no obvious deformity or discoloration with mild/moderate edema. There is diffuse tenderness over the anterior and posterior glenohumeral joint as well as the subacromial space. Range of motion is limited to 90 of flexion and abduction compared to the contralateral side being normal. Passive range of motion limited to 90 of flexion. Muscle strength is 4+ in all directions throughout the limited range of motion. Unable to complete nears  impingement, Michel Bickers, and apprehension.  Neurological: She is alert and oriented to person, place, and time.  Skin: Skin is warm and dry.  Psychiatric: She has a normal mood and affect. Her behavior is normal. Judgment and thought content normal.       Assessment & Plan:   Problem List Items Addressed This Visit      Other   Left shoulder pain    Left diffuse shoulder pain with multiple potential origins given current symptoms including subluxation, tendinitis, calcific tendinitis, or possible labral pathology. This is challenged as there is no history of trauma or injury. Treat conservatively with sling, cold therapy, Pennsaid, Duexis, and prednisone taper. Obtain x-rays to rule out structural abnormality. Follow-up in one week or sooner pending x-ray results.      Relevant Medications   predniSONE (STERAPRED UNI-PAK 21 TAB) 10 MG (21) TBPK tablet   traMADol (ULTRAM) 50 MG tablet   Ibuprofen-Famotidine 800-26.6 MG TABS   Diclofenac Sodium (PENNSAID) 2 % SOLN   Other Relevant Orders   DG Shoulder Left    Other Visit Diagnoses   None.      I have discontinued Ms. Zenor's naproxen and methylPREDNISolone. I am also having her start on predniSONE, Ibuprofen-Famotidine, and Diclofenac Sodium. Additionally, I am having her maintain her diphenhydrAMINE, acetaminophen, albuterol, ibuprofen, triamterene-hydrochlorothiazide, and traMADol.   Meds ordered this encounter  Medications  . predniSONE (STERAPRED UNI-PAK 21 TAB) 10 MG (21) TBPK tablet    Sig: Take 6 tablets x 1 day, 5 tablets x 1 day, 4 tablets x 1 day, 3 tablets x 1 day, 2 tablets x 1 day, 1 tablet x 1 day    Dispense:  21 tablet    Refill:  0    Order Specific Question:   Supervising Provider    Answer:   Pricilla Holm A [id number]  . traMADol (ULTRAM) 50 MG tablet    Sig: Take 1-2 tablets (50-100 mg total) by mouth 2 (two) times daily as needed for moderate pain or severe pain.    Dispense:  60 tablet     Refill:  1    Order Specific Question:   Supervising Provider    Answer:   Pricilla Holm A [id number]  . Ibuprofen-Famotidine 800-26.6 MG TABS    Sig: Take 1 tablet by mouth 3 (three) times daily as needed.    Dispense:  90 tablet    Refill:  1    Order Specific Question:   Supervising Provider    Answer:   Pricilla Holm A [id number]  . Diclofenac Sodium (PENNSAID) 2 % SOLN    Sig: Place 1 application onto the skin 2 (two) times daily as needed.    Dispense:  112 g    Refill:  1  Order Specific Question:   Supervising Provider    Answer:   Pricilla Holm A [id number]     Follow-up: Return in about 1 week (around 06/16/2016), or if symptoms worsen or fail to improve.  Mauricio Po, FNP

## 2016-06-09 NOTE — Patient Instructions (Addendum)
Thank you for choosing [facility]!  Ice x 20 minutes every 2 hours and as needed or following activity  Pennsaid - Approximately 1/2 packet to the affected site twice daily.  Duexis - 1 tablet 3 times per day for the next 5-7 days and then as needed.  Exercises 1-2 times per day as instructed.   Sling as needed.   You will receive a call from [facility] regarding your Pennsaid/Duexis/Vimovo. The medication will be mailed to you and should cost you no more than $10 per item or possibly free depending upon your insurance.   Your prescription(s) have been submitted to your pharmacy or been printed and provided for you. Please take as directed and contact our office if you believe you are having problem(s) with the medication(s) or have any questions.  Please stop by radiology on the basement level of the building for your x-rays. Your results will be released to [facility] (or called to you) after review, usually within 72 hours after test completion. If any treatments or changes are necessary, you will be notified at that same time.  If your symptoms worsen or fail to improve, please contact our office for further instruction, or in case of emergency go directly to the emergency room at the closest medical facility.

## 2016-06-09 NOTE — Assessment & Plan Note (Signed)
Left diffuse shoulder pain with multiple potential origins given current symptoms including subluxation, tendinitis, calcific tendinitis, or possible labral pathology. This is challenged as there is no history of trauma or injury. Treat conservatively with sling, cold therapy, Pennsaid, Duexis, and prednisone taper. Obtain x-rays to rule out structural abnormality. Follow-up in one week or sooner pending x-ray results.

## 2016-06-10 ENCOUNTER — Other Ambulatory Visit: Payer: Self-pay | Admitting: Family

## 2016-06-10 DIAGNOSIS — M25512 Pain in left shoulder: Secondary | ICD-10-CM

## 2016-06-19 ENCOUNTER — Other Ambulatory Visit: Payer: Self-pay | Admitting: Family Medicine

## 2016-06-20 ENCOUNTER — Ambulatory Visit
Admission: RE | Admit: 2016-06-20 | Discharge: 2016-06-20 | Disposition: A | Discharge status: Home / Self Care | Payer: BC Managed Care – PPO | Source: Ambulatory Visit | Attending: Family | Admitting: Family

## 2016-06-20 DIAGNOSIS — M25512 Pain in left shoulder: Secondary | ICD-10-CM

## 2016-06-21 ENCOUNTER — Ambulatory Visit: Payer: BC Managed Care – PPO | Admitting: Family

## 2016-06-21 ENCOUNTER — Other Ambulatory Visit: Payer: Self-pay

## 2016-06-21 ENCOUNTER — Other Ambulatory Visit: Payer: Self-pay | Admitting: Family

## 2016-06-21 DIAGNOSIS — M25512 Pain in left shoulder: Secondary | ICD-10-CM

## 2016-06-24 ENCOUNTER — Ambulatory Visit: Payer: BC Managed Care – PPO | Admitting: Family

## 2016-07-05 ENCOUNTER — Encounter: Payer: Self-pay | Admitting: Student

## 2016-09-25 ENCOUNTER — Encounter (HOSPITAL_COMMUNITY): Payer: Self-pay | Admitting: Emergency Medicine

## 2016-09-25 ENCOUNTER — Emergency Department (HOSPITAL_COMMUNITY)
Admission: EM | Admit: 2016-09-25 | Discharge: 2016-09-25 | Disposition: A | Discharge status: Home / Self Care | Payer: BC Managed Care – PPO | Attending: Emergency Medicine | Admitting: Emergency Medicine

## 2016-09-25 ENCOUNTER — Emergency Department (HOSPITAL_COMMUNITY): Payer: BC Managed Care – PPO

## 2016-09-25 DIAGNOSIS — Z79899 Other long term (current) drug therapy: Secondary | ICD-10-CM | POA: Diagnosis not present

## 2016-09-25 DIAGNOSIS — R7989 Other specified abnormal findings of blood chemistry: Secondary | ICD-10-CM

## 2016-09-25 DIAGNOSIS — R740 Nonspecific elevation of levels of transaminase and lactic acid dehydrogenase [LDH]: Secondary | ICD-10-CM | POA: Diagnosis not present

## 2016-09-25 DIAGNOSIS — Z87891 Personal history of nicotine dependence: Secondary | ICD-10-CM | POA: Insufficient documentation

## 2016-09-25 DIAGNOSIS — G4489 Other headache syndrome: Secondary | ICD-10-CM

## 2016-09-25 DIAGNOSIS — R0602 Shortness of breath: Secondary | ICD-10-CM | POA: Diagnosis present

## 2016-09-25 DIAGNOSIS — F419 Anxiety disorder, unspecified: Secondary | ICD-10-CM

## 2016-09-25 DIAGNOSIS — R51 Headache: Secondary | ICD-10-CM | POA: Diagnosis not present

## 2016-09-25 LAB — URINALYSIS, ROUTINE W REFLEX MICROSCOPIC
Bilirubin Urine: NEGATIVE
GLUCOSE, UA: NEGATIVE mg/dL
Hgb urine dipstick: NEGATIVE
Ketones, ur: NEGATIVE mg/dL
Nitrite: POSITIVE — AB
PH: 6 (ref 5.0–8.0)
PROTEIN: NEGATIVE mg/dL
Specific Gravity, Urine: 1.017 (ref 1.005–1.030)

## 2016-09-25 LAB — COMPREHENSIVE METABOLIC PANEL
ALT: 18 U/L (ref 14–54)
AST: 26 U/L (ref 15–41)
Albumin: 4.1 g/dL (ref 3.5–5.0)
Alkaline Phosphatase: 71 U/L (ref 38–126)
Anion gap: 12 (ref 5–15)
BUN: 10 mg/dL (ref 6–20)
CHLORIDE: 107 mmol/L (ref 101–111)
CO2: 20 mmol/L — AB (ref 22–32)
CREATININE: 0.94 mg/dL (ref 0.44–1.00)
Calcium: 9.6 mg/dL (ref 8.9–10.3)
GFR calc Af Amer: >60 mL/min (ref >60)
GFR calc non Af Amer: >60 mL/min (ref >60)
GLUCOSE: 151 mg/dL — AB (ref 65–99)
Potassium: 4.1 mmol/L (ref 3.5–5.1)
SODIUM: 139 mmol/L (ref 135–145)
Total Bilirubin: 0.8 mg/dL (ref 0.3–1.2)
Total Protein: 6.9 g/dL (ref 6.5–8.1)

## 2016-09-25 LAB — I-STAT CHEM 8, ED
BUN: 13 mg/dL (ref 6–20)
Calcium, Ion: 1.09 mmol/L — ABNORMAL LOW (ref 1.15–1.40)
Chloride: 106 mmol/L (ref 101–111)
Creatinine, Ser: 0.9 mg/dL (ref 0.44–1.00)
Glucose, Bld: 154 mg/dL — ABNORMAL HIGH (ref 65–99)
HEMATOCRIT: 41 % (ref 36.0–46.0)
Hemoglobin: 13.9 g/dL (ref 12.0–15.0)
POTASSIUM: 4.1 mmol/L (ref 3.5–5.1)
SODIUM: 141 mmol/L (ref 135–145)
TCO2: 22 mmol/L (ref 0–100)

## 2016-09-25 LAB — PROTIME-INR
INR: 1
PROTHROMBIN TIME: 13.2 s (ref 11.4–15.2)

## 2016-09-25 LAB — CBC
HCT: 39 % (ref 36.0–46.0)
HEMOGLOBIN: 12.7 g/dL (ref 12.0–15.0)
MCH: 21.7 pg — ABNORMAL LOW (ref 26.0–34.0)
MCHC: 32.6 g/dL (ref 30.0–36.0)
MCV: 66.8 fL — AB (ref 78.0–100.0)
PLATELETS: 198 10*3/uL (ref 150–400)
RBC: 5.84 MIL/uL — AB (ref 3.87–5.11)
RDW: 18 % — ABNORMAL HIGH (ref 11.5–15.5)
WBC: 11 10*3/uL — AB (ref 4.0–10.5)

## 2016-09-25 LAB — DIFFERENTIAL
BASOS PCT: 0 %
Basophils Absolute: 0 10*3/uL (ref 0.0–0.1)
EOS ABS: 0.1 10*3/uL (ref 0.0–0.7)
Eosinophils Relative: 1 %
LYMPHS PCT: 38 %
Lymphs Abs: 4.2 10*3/uL — ABNORMAL HIGH (ref 0.7–4.0)
Monocytes Absolute: 0.6 10*3/uL (ref 0.1–1.0)
Monocytes Relative: 5 %
NEUTROS PCT: 56 %
Neutro Abs: 6.1 10*3/uL (ref 1.7–7.7)

## 2016-09-25 LAB — RAPID URINE DRUG SCREEN, HOSP PERFORMED
AMPHETAMINES: NOT DETECTED
BARBITURATES: NOT DETECTED
BENZODIAZEPINES: NOT DETECTED
Cocaine: NOT DETECTED
Opiates: NOT DETECTED
Tetrahydrocannabinol: NOT DETECTED

## 2016-09-25 LAB — ETHANOL: Alcohol, Ethyl (B): <5 mg/dL (ref <5)

## 2016-09-25 LAB — I-STAT TROPONIN, ED: Troponin i, poc: 0.01 ng/mL (ref 0.00–0.08)

## 2016-09-25 LAB — I-STAT CG4 LACTIC ACID, ED
LACTIC ACID, VENOUS: 0.9 mmol/L (ref 0.5–1.9)
Lactic Acid, Venous: 4.4 mmol/L (ref 0.5–1.9)

## 2016-09-25 LAB — APTT: aPTT: 25 seconds (ref 24–36)

## 2016-09-25 LAB — CBG MONITORING, ED: Glucose-Capillary: 157 mg/dL — ABNORMAL HIGH (ref 65–99)

## 2016-09-25 LAB — CK: CK TOTAL: 182 U/L (ref 38–234)

## 2016-09-25 MED ORDER — LORAZEPAM 2 MG/ML IJ SOLN
1.0000 mg | Freq: Once | INTRAMUSCULAR | Status: AC
Start: 2016-09-25 — End: 2016-09-25
  Administered 2016-09-25: 1 mg via INTRAVENOUS
  Filled 2016-09-25: qty 1

## 2016-09-25 MED ORDER — LORAZEPAM 1 MG PO TABS: 1.0000 mg | ORAL_TABLET | Freq: Four times a day (QID) | ORAL | 0 refills | Status: DC | PRN

## 2016-09-25 MED ORDER — SODIUM CHLORIDE 0.9 % IV BOLUS (SEPSIS)
1000.0000 mL | Freq: Once | INTRAVENOUS | Status: AC
  Administered 2016-09-25: 1000 mL via INTRAVENOUS

## 2016-09-25 NOTE — ED Provider Notes (Addendum)
[facility] DEPT Provider Note   CSN: [id number] Arrival date & time: 09/25/16  0006  By signing my name below, I, Molly Bean, attest that this documentation has been prepared under the direction and in the presence of [name], MD . Electronically Signed: Dolores Bean, Scribe. 09/25/2016. 12:37 AM.  History   Chief Complaint Chief Complaint  Patient presents with  . Code Stroke   The history is provided by the patient and the spouse. No language interpreter was used.    HPI Comments:  Molly Bean is a 55 y.o. female with pmhx of GERD who presents to the Emergency Department complaining of sudden-onset constant SOB beginning earlier tonight. Per pt's husband, the pt was home laying in bed when she asked him to help her get up. He states she was struggling to get up and struggling to walk. Pt's husband notes she began shaking and fell to the floor. She reports associated left arm pain and vomiting, which made her feel a little better. Pt denies any CP, neck pain, back pain, fever, sweats, or chest tightness.  Past Medical History:  Diagnosis Date  . DEPRESSION   . Edema   . GERD   . MENOPAUSAL SYNDROME   . OBESITY   . UTI'S, CHRONIC     Patient Active Problem List   Diagnosis Date Noted  . Left shoulder pain 06/09/2016  . Shellfish allergy 09/23/2014  . Greater trochanteric bursitis of left hip 06/18/2014  . Left groin pain 06/14/2014  . Obese 01/30/2013  . Near syncope   . LIPOMA 11/06/2010  . TINEA PEDIS 04/23/2009  . MENOPAUSAL SYNDROME 04/23/2009  . OBESITY 02/15/2008  . DERMATITIS 06/20/2007  . Skin infection 04/12/2007  . DEPRESSION 04/12/2007  . GERD 04/12/2007  . UTI'S, CHRONIC 04/12/2007  . EDEMA 04/12/2007  . HEADACHE 04/12/2007    Past Surgical History:  Procedure Laterality Date  . ABDOMINAL HYSTERECTOMY     total fibroids  . CESAREAN SECTION      OB History    No data available       Home Medications    Prior to Admission  medications   Medication Sig Start Date End Date Taking? Authorizing Provider  acetaminophen (TYLENOL) 500 MG tablet Take 1,000 mg by mouth every 6 (six) hours as needed for mild pain or headache.    Historical Provider, MD  albuterol (PROVENTIL HFA;VENTOLIN HFA) 108 (90 BASE) MCG/ACT inhaler Inhale 2 puffs into the lungs every 6 (six) hours as needed for wheezing or shortness of breath. 09/23/14   Golden Circle, FNP  diphenhydrAMINE (BENADRYL) 25 mg capsule Take 50 mg by mouth once as needed for allergies. Reported on 11/03/2015    Historical Provider, MD  ibuprofen (ADVIL,MOTRIN) 800 MG tablet Take one at the onset of HA. 11/03/15   Tereasa Coop, PA-C  Ibuprofen-Famotidine 800-26.6 MG TABS Take 1 tablet by mouth 3 (three) times daily as needed. 06/09/16   Golden Circle, FNP  predniSONE (STERAPRED UNI-PAK 21 TAB) 10 MG (21) TBPK tablet Take 6 tablets x 1 day, 5 tablets x 1 day, 4 tablets x 1 day, 3 tablets x 1 day, 2 tablets x 1 day, 1 tablet x 1 day 06/09/16   Golden Circle, FNP  traMADol (ULTRAM) 50 MG tablet Take 1-2 tablets (50-100 mg total) by mouth 2 (two) times daily as needed for moderate pain or severe pain. 06/09/16   Golden Circle, FNP  triamterene-hydrochlorothiazide (MAXZIDE-25) 37.5-25 MG tablet TAKE 0.5-1 TABLETS BY  MOUTH DAILY. 06/23/16   Marletta Lor, MD    Family History Family History  Problem Relation Age of Onset  . Coronary artery disease Mother   . Hypertension Mother   . Diabetes Mother   . Atrial fibrillation Mother   . Seizures Father   . Lupus Cousin   . Breast cancer Sister 38    Social History Social History  Substance Use Topics  . Smoking status: Former Smoker    Packs/day: 0.30    Years: 20.00    Types: Cigarettes  . Smokeless tobacco: Former Systems developer    Quit date: 10/05/1987  . Alcohol use No     Allergies   Pennsaid [diclofenac sodium]; Oxycodone; and Shellfish allergy   Review of Systems Review of Systems   Physical Exam Updated  Vital Signs Resp (!) 33   Ht 5\' 2"  (1.575 m)   Wt 230 lb 14.4 oz (104.7 kg)   SpO2 99%   BMI 42.23 kg/m   Physical Exam  Constitutional: She is oriented to person, place, and time. She appears well-developed and well-nourished.  Anxious with generalized shaking.   HENT:  Head: Normocephalic and atraumatic.  Eyes: EOM are normal. Pupils are equal, round, and reactive to light.  Neck: Normal range of motion. Neck supple. No JVD present.  Cardiovascular: Normal rate, regular rhythm and normal heart sounds.   No murmur heard. Pulmonary/Chest: Effort normal and breath sounds normal. She has no wheezes. She has no rales. She exhibits no tenderness.  Abdominal: Soft. Bowel sounds are normal. She exhibits no distension and no mass. There is no tenderness.  Musculoskeletal: Normal range of motion. She exhibits no edema.  Lymphadenopathy:    She has no cervical adenopathy.  Neurological: She is alert and oriented to person, place, and time. No cranial nerve deficit. She exhibits normal muscle tone. Coordination normal.  Legs symmetrically weak with strength 3-4/5 bilaterally. Generally poor cooperation with neurologic exam.   Skin: Skin is warm and dry. No rash noted.  Psychiatric: She has a normal mood and affect. Her behavior is normal. Judgment and thought content normal.  Nursing note and vitals reviewed.    ED Treatments / Results  DIAGNOSTIC STUDIES:  Oxygen Saturation is 99% on RA, normal by my interpretation.    COORDINATION OF CARE:  12:44 AM Discussed treatment plan with pt at bedside which includes imaging and blood work and pt agreed to plan.  3:48 AM Spoke with pt about blood work Chief Operating Officer (all labs ordered are listed, but only abnormal results are displayed) Labs Reviewed  CBC - Abnormal; Notable for the following:       Result Value   WBC 11.0 (*)    RBC 5.84 (*)    MCV 66.8 (*)    MCH 21.7 (*)    RDW 18.0 (*)    All other components within normal limits   DIFFERENTIAL - Abnormal; Notable for the following:    Lymphs Abs 4.2 (*)    All other components within normal limits  COMPREHENSIVE METABOLIC PANEL - Abnormal; Notable for the following:    CO2 20 (*)    Glucose, Bld 151 (*)    All other components within normal limits  URINALYSIS, ROUTINE W REFLEX MICROSCOPIC - Abnormal; Notable for the following:    APPearance HAZY (*)    Nitrite POSITIVE (*)    Leukocytes, UA TRACE (*)    Bacteria, UA MANY (*)    Squamous Epithelial / LPF 0-5 (*)  All other components within normal limits  CBG MONITORING, ED - Abnormal; Notable for the following:    Glucose-Capillary 157 (*)    All other components within normal limits  I-STAT CHEM 8, ED - Abnormal; Notable for the following:    Glucose, Bld 154 (*)    Calcium, Ion 1.09 (*)    All other components within normal limits  I-STAT CG4 LACTIC ACID, ED - Abnormal; Notable for the following:    Lactic Acid, Venous 4.40 (*)    All other components within normal limits  ETHANOL  RAPID URINE DRUG SCREEN, HOSP PERFORMED  PROTIME-INR  APTT  CK  I-STAT TROPOININ, ED  I-STAT CG4 LACTIC ACID, ED    EKG  EKG Interpretation  Date/Time:  Saturday September 25 2016 00:26:23 EST Ventricular Rate:  88 PR Interval:    QRS Duration: 71 QT Interval:  442 QTC Calculation: 535 R Axis:   31 Text Interpretation:  Sinus rhythm Borderline T abnormalities, anterior leads Prolonged QT interval When compared with ECG of 03/09/2014, QT has lengthened Confirmed by Roxanne Mins  MD, Esmerelda Finnigan ([id number]) on 09/25/2016 12:33:41 AM       Radiology Ct Head Code Stroke W/o Cm  Result Date: 09/25/2016 CLINICAL DATA:  Code stroke. Sudden onset headache, right facial droop and right-sided weakness. EXAM: CT HEAD WITHOUT CONTRAST TECHNIQUE: Contiguous axial images were obtained from the base of the skull through the vertex without intravenous contrast. COMPARISON:  None. FINDINGS: Brain: No mass lesion, intraparenchymal hemorrhage or  extra-axial collection. No evidence of acute cortical infarct. Brain parenchyma and CSF-containing spaces are normal for age. Vascular: No hyperdense vessel or unexpected calcification. Skull: Normal visualized skull base, calvarium and extracranial soft tissues. Sinuses/Orbits: No sinus fluid levels or advanced mucosal thickening. No mastoid effusion. Normal orbits. ASPECTS [facility] Stroke Program Early CT Score) - Ganglionic level infarction (caudate, lentiform nuclei, internal capsule, insula, M1-M3 cortex): 7 - Supraganglionic infarction (M4-M6 cortex): 3 Total score (0-10 with 10 being normal): 10 IMPRESSION: 1. Normal head CT for age. 2. ASPECTS is 10. These results were called by telephone at the time of interpretation on 09/25/2016 at 12:26 am to Dr. Wallie Char, who verbally acknowledged these results. Electronically Signed   By: Ulyses Jarred M.D.   On: 09/25/2016 00:26    Procedures Procedures (including critical care time) CRITICAL CARE Performed by: [id number] Total critical care time: 35 minutes Critical care time was exclusive of separately billable procedures and treating other patients. Critical care was necessary to treat or prevent imminent or life-threatening deterioration. Critical care was time spent personally by me on the following activities: development of treatment plan with patient and/or surrogate as well as nursing, discussions with consultants, evaluation of patient's response to treatment, examination of patient, obtaining history from patient or surrogate, ordering and performing treatments and interventions, ordering and review of laboratory studies, ordering and review of radiographic studies, pulse oximetry and re-evaluation of patient's condition.  Medications Ordered in ED Medications  LORazepam (ATIVAN) injection 1 mg (1 mg Intravenous Given 09/25/16 0120)  sodium chloride 0.9 % bolus 1,000 mL (0 mLs Intravenous Stopped 09/25/16 0501)     Initial  Impression / Assessment and Plan / ED Course  I have reviewed the triage vital signs and the nursing notes.  Pertinent labs & imaging results that were available during my care of the patient were reviewed by me and considered in my medical decision making (see chart for details).  Clinical Course    Patient brought in by  ambulance as a code stroke. However, she has no neurologic deficit to suggest stroke. She is uncontrollable he shaking. She is given a dose of lorazepam. Screening labs are obtained and lactic acid level came back significantly elevated at 4.0. This is felt to be due to her shaking and not due to sepsis. She is given some IV fluid and stated that she felt much better. Repeat lactic acid has come back normal at 0.90. This is consistent with elevated lactic acid due to excessive muscle activity. She is discharged with prescription for lorazepam to use as needed. Otherwise, follow-up with PCP.  Final Clinical Impressions(s) / ED Diagnoses   Final diagnoses:  Anxiety  Elevated lactic acid level    New Prescriptions New Prescriptions   No medications on file   I personally performed the services described in this documentation, which was scribed in my presence. The recorded information has been reviewed and is accurate.       [name], MD [id number]    [name], MD [id number]

## 2016-09-25 NOTE — ED Notes (Signed)
Pt just stated to the MD that she came to the ER because she couldn't breath.

## 2016-09-25 NOTE — ED Notes (Signed)
Pt coming from home. Code stroke activated in the field. Pt had a sudden onset of a headache. At 2315 pt vomited. At 2320 pt had asphasia. Pt significant other able to recount the details for EMS. Pt has R sided facial droop and is grimacing upon arrival. Pt had bilateral weak grips. Pt unable to lift her legs. Upon EMS arrival to was in the bathroom and while attempting to get her to the stretcher pt became dead wt.  145 CBG 98% RA 18 R 136/74 BP ST at 115

## 2016-09-25 NOTE — ED Notes (Signed)
While asking triage questions pt admits to not having any advance directives when this RN asked if the pt would like CPR performed if her heart were to stop beating or she stopped breathing would she like CPR performed shook her head no.

## 2016-09-25 NOTE — Consult Note (Signed)
Admission H&P    Chief Complaint: Headache and equivocal facial droop.  HPI: Molly Bean is an 55 y.o. female with a history of depression, GERD and obesity, brought to the ED in code stroke status following sudden onset of headache and unsteady gait as well as equivocal facial droop. No focal weakness was noted. There was no change in speech. She has no previous history of stroke nor TIA. CT scan of her head showed no acute intracranial abnormality. NIH stroke score in the ED was 0. Patient was noted to be quite anxious and tremulous. She was given Ativan by the ED physician and symptoms improved markedly. She was subsequently discharged home.   Past Medical History:  Diagnosis Date  . DEPRESSION   . Edema   . GERD   . MENOPAUSAL SYNDROME   . OBESITY   . UTI'S, CHRONIC     Past Surgical History:  Procedure Laterality Date  . ABDOMINAL HYSTERECTOMY     total fibroids  . CESAREAN SECTION      Family History  Problem Relation Age of Onset  . Coronary artery disease Mother   . Hypertension Mother   . Diabetes Mother   . Atrial fibrillation Mother   . Seizures Father   . Lupus Cousin   . Breast cancer Sister 66   Social History:  reports that she has quit smoking. Her smoking use included Cigarettes. She has a 6.00 pack-year smoking history. She quit smokeless tobacco use about 28 years ago. She reports that she does not drink alcohol or use drugs.  Allergies:  Allergies  Allergen Reactions  . Pennsaid [Diclofenac Sodium] Rash  . Oxycodone     Hallucinations  . Shellfish Allergy     Medications: Preadmission medications were reviewed by me.  ROS: History obtained from the patient  General ROS: negative for - chills, fatigue, fever, night sweats, weight gain or weight loss Psychological ROS: negative for - behavioral disorder, hallucinations, memory difficulties, mood swings or suicidal ideation Ophthalmic ROS: negative for - blurry vision, double vision, eye pain  or loss of vision ENT ROS: negative for - epistaxis, nasal discharge, oral lesions, sore throat, tinnitus or vertigo Allergy and Immunology ROS: negative for - hives or itchy/watery eyes Hematological and Lymphatic ROS: negative for - bleeding problems, bruising or swollen lymph nodes Endocrine ROS: negative for - galactorrhea, hair pattern changes, polydipsia/polyuria or temperature intolerance Respiratory ROS: negative for - cough, hemoptysis, shortness of breath or wheezing Cardiovascular ROS: negative for - chest pain, dyspnea on exertion, edema or irregular heartbeat Gastrointestinal ROS: negative for - abdominal pain, diarrhea, hematemesis, nausea/vomiting or stool incontinence Genito-Urinary ROS: negative for - dysuria, hematuria, incontinence or urinary frequency/urgency Musculoskeletal ROS: negative for - joint swelling or muscular weakness Neurological ROS: as noted in HPI Dermatological ROS: negative for rash and skin lesion changes  Physical Examination: Blood pressure 111/64, pulse 80, resp. rate 17, height 5' 2"  (1.575 m), weight 104.7 kg (230 lb 14.4 oz), SpO2 99 %.  HEENT-  Normocephalic, no lesions, without obvious abnormality.  Normal external eye and conjunctiva.  Normal TM's bilaterally.  Normal auditory canals and external ears. Normal external nose, mucus membranes and septum.  Normal pharynx. Neck supple with no masses, nodes, nodules or enlargement. Cardiovascular - regular rate and rhythm, S1, S2 normal, no murmur, click, rub or gallop Lungs - chest clear, no wheezing, rales, normal symmetric air entry Abdomen - soft, non-tender; bowel sounds normal; no masses,  no organomegaly Extremities - no joint deformities,  effusion, or inflammation  Neurologic Examination: Mental Status: Alert, oriented, markedly anxious and complaining of headache.  Speech fluent without evidence of aphasia. Able to follow commands without difficulty. Cranial Nerves: II-Visual fields were  normal. III/IV/VI-Pupils were equal and reacted normally to light. Extraocular movements were full and conjugate.  V/VII-no facial numbness and no facial weakness. VIII-normal. X-normal speech. XI: trapezius strength/neck flexion strength normal bilaterally XII-midline tongue extension with normal strength. Motor: 5/5 bilaterally with normal tone and bulk Sensory: Normal throughout. Deep Tendon Reflexes: 2+ and symmetric. Plantars: Flexor bilaterally Cerebellar: Normal finger-to-nose testing. Carotid auscultation: Normal  Results for orders placed or performed during the hospital encounter of 09/25/16 (from the past 48 hour(s))  CBG monitoring, ED     Status: Abnormal   Collection Time: 09/25/16 12:08 AM  Result Value Ref Range   Glucose-Capillary 157 (H) 65 - 99 mg/dL  Ethanol     Status: None   Collection Time: 09/25/16 12:12 AM  Result Value Ref Range   Alcohol, Ethyl (B) <5 <5 mg/dL    Comment:        LOWEST DETECTABLE LIMIT FOR SERUM ALCOHOL IS 5 mg/dL FOR MEDICAL PURPOSES ONLY   CBC     Status: Abnormal   Collection Time: 09/25/16 12:12 AM  Result Value Ref Range   WBC 11.0 (H) 4.0 - 10.5 K/uL   RBC 5.84 (H) 3.87 - 5.11 MIL/uL   Hemoglobin 12.7 12.0 - 15.0 g/dL   HCT 39.0 36.0 - 46.0 %   MCV 66.8 (L) 78.0 - 100.0 fL   MCH 21.7 (L) 26.0 - 34.0 pg   MCHC 32.6 30.0 - 36.0 g/dL   RDW 18.0 (H) 11.5 - 15.5 %   Platelets 198 150 - 400 K/uL  Differential     Status: Abnormal   Collection Time: 09/25/16 12:12 AM  Result Value Ref Range   Neutrophils Relative % 56 %   Lymphocytes Relative 38 %   Monocytes Relative 5 %   Eosinophils Relative 1 %   Basophils Relative 0 %   Neutro Abs 6.1 1.7 - 7.7 K/uL   Lymphs Abs 4.2 (H) 0.7 - 4.0 K/uL   Monocytes Absolute 0.6 0.1 - 1.0 K/uL   Eosinophils Absolute 0.1 0.0 - 0.7 K/uL   Basophils Absolute 0.0 0.0 - 0.1 K/uL   RBC Morphology POLYCHROMASIA PRESENT   Comprehensive metabolic panel     Status: Abnormal   Collection Time:  09/25/16 12:12 AM  Result Value Ref Range   Sodium 139 135 - 145 mmol/L   Potassium 4.1 3.5 - 5.1 mmol/L    Comment: SLIGHT HEMOLYSIS   Chloride 107 101 - 111 mmol/L   CO2 20 (L) 22 - 32 mmol/L   Glucose, Bld 151 (H) 65 - 99 mg/dL   BUN 10 6 - 20 mg/dL   Creatinine, Ser 0.94 0.44 - 1.00 mg/dL   Calcium 9.6 8.9 - 10.3 mg/dL   Total Protein 6.9 6.5 - 8.1 g/dL   Albumin 4.1 3.5 - 5.0 g/dL   AST 26 15 - 41 U/L   ALT 18 14 - 54 U/L   Alkaline Phosphatase 71 38 - 126 U/L   Total Bilirubin 0.8 0.3 - 1.2 mg/dL   GFR calc non Af Amer >60 >60 mL/min   GFR calc Af Amer >60 >60 mL/min    Comment: (NOTE) The eGFR has been calculated using the CKD EPI equation. This calculation has not been validated in all clinical situations. eGFR's persistently <60 mL/min  signify possible Chronic Kidney Disease.    Anion gap 12 5 - 15  I-stat troponin, ED (not at [facility])     Status: None   Collection Time: 09/25/16 12:15 AM  Result Value Ref Range   Troponin i, poc 0.01 0.00 - 0.08 ng/mL   Comment 3            Comment: Due to the release kinetics of cTnI, a negative result within the first hours of the onset of symptoms does not rule out myocardial infarction with certainty. If myocardial infarction is still suspected, repeat the test at appropriate intervals.   I-Stat Chem 8, ED  (not at [facility])     Status: Abnormal   Collection Time: 09/25/16 12:16 AM  Result Value Ref Range   Sodium 141 135 - 145 mmol/L   Potassium 4.1 3.5 - 5.1 mmol/L   Chloride 106 101 - 111 mmol/L   BUN 13 6 - 20 mg/dL   Creatinine, Ser 0.90 0.44 - 1.00 mg/dL   Glucose, Bld 154 (H) 65 - 99 mg/dL   Calcium, Ion 1.09 (L) 1.15 - 1.40 mmol/L   TCO2 22 0 - 100 mmol/L   Hemoglobin 13.9 12.0 - 15.0 g/dL   HCT 41.0 36.0 - 46.0 %  I-Stat CG4 Lactic Acid, ED     Status: Abnormal   Collection Time: 09/25/16 12:16 AM  Result Value Ref Range   Lactic Acid, Venous 4.40 (HH) 0.5 - 1.9 mmol/L   Comment NOTIFIED PHYSICIAN    Protime-INR     Status: None   Collection Time: 09/25/16 12:20 AM  Result Value Ref Range   Prothrombin Time 13.2 11.4 - 15.2 seconds   INR 1.00   APTT     Status: None   Collection Time: 09/25/16 12:20 AM  Result Value Ref Range   aPTT 25 24 - 36 seconds  CK     Status: None   Collection Time: 09/25/16 12:41 AM  Result Value Ref Range   Total CK 182 38 - 234 U/L  I-Stat CG4 Lactic Acid, ED     Status: None   Collection Time: 09/25/16  3:57 AM  Result Value Ref Range   Lactic Acid, Venous 0.90 0.5 - 1.9 mmol/L  Urine rapid drug screen (hosp performed)not at [facility]     Status: None   Collection Time: 09/25/16  4:16 AM  Result Value Ref Range   Opiates NONE DETECTED NONE DETECTED   Cocaine NONE DETECTED NONE DETECTED   Benzodiazepines NONE DETECTED NONE DETECTED   Amphetamines NONE DETECTED NONE DETECTED   Tetrahydrocannabinol NONE DETECTED NONE DETECTED   Barbiturates NONE DETECTED NONE DETECTED    Comment:        DRUG SCREEN FOR MEDICAL PURPOSES ONLY.  IF CONFIRMATION IS NEEDED FOR ANY PURPOSE, NOTIFY LAB WITHIN 5 DAYS.        LOWEST DETECTABLE LIMITS FOR URINE DRUG SCREEN Drug Class       Cutoff (ng/mL) Amphetamine      1000 Barbiturate      200 Benzodiazepine   149 Tricyclics       702 Opiates          300 Cocaine          300 THC              50   Urinalysis, Routine w reflex microscopic     Status: Abnormal   Collection Time: 09/25/16  4:16 AM  Result Value Ref Range  Color, Urine YELLOW YELLOW   APPearance HAZY (A) CLEAR   Specific Gravity, Urine 1.017 1.005 - 1.030   pH 6.0 5.0 - 8.0   Glucose, UA NEGATIVE NEGATIVE mg/dL   Hgb urine dipstick NEGATIVE NEGATIVE   Bilirubin Urine NEGATIVE NEGATIVE   Ketones, ur NEGATIVE NEGATIVE mg/dL   Protein, ur NEGATIVE NEGATIVE mg/dL   Nitrite POSITIVE (A) NEGATIVE   Leukocytes, UA TRACE (A) NEGATIVE   RBC / HPF 0-5 0 - 5 RBC/hpf   WBC, UA 0-5 0 - 5 WBC/hpf   Bacteria, UA MANY (A) NONE SEEN   Squamous Epithelial /  LPF 0-5 (A) NONE SEEN   Mucous PRESENT    Ct Head Code Stroke W/o Cm  Result Date: 09/25/2016 CLINICAL DATA:  Code stroke. Sudden onset headache, right facial droop and right-sided weakness. EXAM: CT HEAD WITHOUT CONTRAST TECHNIQUE: Contiguous axial images were obtained from the base of the skull through the vertex without intravenous contrast. COMPARISON:  None. FINDINGS: Brain: No mass lesion, intraparenchymal hemorrhage or extra-axial collection. No evidence of acute cortical infarct. Brain parenchyma and CSF-containing spaces are normal for age. Vascular: No hyperdense vessel or unexpected calcification. Skull: Normal visualized skull base, calvarium and extracranial soft tissues. Sinuses/Orbits: No sinus fluid levels or advanced mucosal thickening. No mastoid effusion. Normal orbits. ASPECTS [facility] Stroke Program Early CT Score) - Ganglionic level infarction (caudate, lentiform nuclei, internal capsule, insula, M1-M3 cortex): 7 - Supraganglionic infarction (M4-M6 cortex): 3 Total score (0-10 with 10 being normal): 10 IMPRESSION: 1. Normal head CT for age. 2. ASPECTS is 10. These results were called by telephone at the time of interpretation on 09/25/2016 at 12:26 am to Dr. Wallie Char, who verbally acknowledged these results. Electronically Signed   By: Ulyses Jarred M.D.   On: 09/25/2016 00:26    Assessment/Plan 55 year old lady with complaint of headache with tremulousness and anxiety. Symptoms improved significantly after receiving lorazepam IV. No focal deficits were noted on neurologic exam and CT scan of her head was unremarkable. Patient had no indications clinically TIA or stroke.  No further neurological intervention indicated. Follow-up with PCP on routine basis.  C.R. Nicole Kindred, MD [facility] [phone]  09/25/2016, 7:29 AM

## 2016-09-25 NOTE — ED Notes (Signed)
Lab contacted to add on CK 

## 2016-09-25 NOTE — Progress Notes (Signed)
Pt from home to ED via [facility], sudden onset headache, unsteady gait, right side facial droop. Nihss 0, LKW 2300, CBG 154. Code stroke cancelled, ED MD to dispo

## 2016-10-11 ENCOUNTER — Encounter: Payer: Self-pay | Admitting: Family

## 2016-10-11 ENCOUNTER — Ambulatory Visit (INDEPENDENT_AMBULATORY_CARE_PROVIDER_SITE_OTHER): Payer: BC Managed Care – PPO | Admitting: Family

## 2016-10-11 DIAGNOSIS — F411 Generalized anxiety disorder: Secondary | ICD-10-CM

## 2016-10-11 MED ORDER — LORAZEPAM 1 MG PO TABS: 0.5000 mg | ORAL_TABLET | Freq: Two times a day (BID) | ORAL | 0 refills | Status: DC | PRN

## 2016-10-11 MED ORDER — TRIAMTERENE-HCTZ 37.5-25 MG PO TABS: 0.5000 | ORAL_TABLET | Freq: Every day | ORAL | 3 refills | Status: DC

## 2016-10-11 MED ORDER — ALBUTEROL SULFATE HFA 108 (90 BASE) MCG/ACT IN AERS: 2.0000 | INHALATION_SPRAY | Freq: Four times a day (QID) | RESPIRATORY_TRACT | 3 refills | Status: DC | PRN

## 2016-10-11 NOTE — Assessment & Plan Note (Signed)
This is a new problem. Symptoms and exam are consistent with anxiety and possible panic attacks. Appear stable with lorazepam as needed. Patient declines counseling at this time. If symptoms worsen or frequency increases consider daily medication. Continue current dosage of lorazepam and continue to monitor.

## 2016-10-11 NOTE — Patient Instructions (Signed)
Thank you for choosing [facility].  SUMMARY AND INSTRUCTIONS:  Medication:  Please take the lorazepam as needed for anxiety.  Continue to take your other medications as prescribed.   Your prescription(s) have been submitted to your pharmacy or been printed and provided for you. Please take as directed and contact our office if you believe you are having problem(s) with the medication(s) or have any questions.  Follow up:  If your symptoms worsen or fail to improve, please contact our office for further instruction, or in case of emergency go directly to the emergency room at the closest medical facility.     Stress and Stress Management Stress is a normal reaction to life events. It is what you feel when life demands more than you are used to or more than you can handle. Some stress can be useful. For example, the stress reaction can help you catch the last bus of the day, study for a test, or meet a deadline at work. But stress that occurs too often or for too long can cause problems. It can affect your emotional health and interfere with relationships and normal daily activities. Too much stress can weaken your immune system and increase your risk for physical illness. If you already have a medical problem, stress can make it worse. What are the causes? All sorts of life events may cause stress. An event that causes stress for one person may not be stressful for another person. Major life events commonly cause stress. These may be positive or negative. Examples include losing your job, moving into a new home, getting married, having a baby, or losing a loved one. Less obvious life events may also cause stress, especially if they occur day after day or in combination. Examples include working long hours, driving in traffic, caring for children, being in debt, or being in a difficult relationship. What are the signs or symptoms? Stress may cause emotional symptoms including, the  following:  Anxiety. This is feeling worried, afraid, on edge, overwhelmed, or out of control.  Anger. This is feeling irritated or impatient.  Depression. This is feeling sad, down, helpless, or guilty.  Difficulty focusing, remembering, or making decisions. Stress may cause physical symptoms, including the following:  Aches and pains. These may affect your head, neck, back, stomach, or other areas of your body.  Tight muscles or clenched jaw.  Low energy or trouble sleeping. Stress may cause unhealthy behaviors, including the following:  Eating to feel better (overeating) or skipping meals.  Sleeping too little, too much, or both.  Working too much or putting off tasks (procrastination).  Smoking, drinking alcohol, or using drugs to feel better. How is this diagnosed? Stress is diagnosed through an assessment by your health care provider. Your health care provider will ask questions about your symptoms and any stressful life events.Your health care provider will also ask about your medical history and may order blood tests or other tests. Certain medical conditions and medicine can cause physical symptoms similar to stress. Mental illness can cause emotional symptoms and unhealthy behaviors similar to stress. Your health care provider may refer you to a mental health professional for further evaluation. How is this treated? Stress management is the recommended treatment for stress.The goals of stress management are reducing stressful life events and coping with stress in healthy ways. Techniques for reducing stressful life events include the following:  Stress identification. Self-monitor for stress and identify what causes stress for you. These skills may help you to  avoid some stressful events.  Time management. Set your priorities, keep a calendar of events, and learn to say "no." These tools can help you avoid making too many commitments. Techniques for coping with stress  include the following:  Rethinking the problem. Try to think realistically about stressful events rather than ignoring them or overreacting. Try to find the positives in a stressful situation rather than focusing on the negatives.  Exercise. Physical exercise can release both physical and emotional tension. The key is to find a form of exercise you enjoy and do it regularly.  Relaxation techniques. These relax the body and mind. Examples include yoga, meditation, tai chi, biofeedback, deep breathing, progressive muscle relaxation, listening to music, being out in nature, journaling, and other hobbies. Again, the key is to find one or more that you enjoy and can do regularly.  Healthy lifestyle. Eat a balanced diet, get plenty of sleep, and do not smoke. Avoid using alcohol or drugs to relax.  Strong support network. Spend time with family, friends, or other people you enjoy being around.Express your feelings and talk things over with someone you trust. Counseling or talktherapy with a mental health professional may be helpful if you are having difficulty managing stress on your own. Medicine is typically not recommended for the treatment of stress.Talk to your health care provider if you think you need medicine for symptoms of stress. Follow these instructions at home:  Keep all follow-up visits as directed by your health care provider.  Take all medicines as directed by your health care provider. Contact a health care provider if:  Your symptoms get worse or you start having new symptoms.  You feel overwhelmed by your problems and can no longer manage them on your own. Get help right away if:  You feel like hurting yourself or someone else. This information is not intended to replace advice given to you by your health care provider. Make sure you discuss any questions you have with your health care provider. Document Released: 03/16/2001 Document Revised: 02/26/2016 Document Reviewed:  05/15/2013 Elsevier Interactive Patient Education  2017 [employer].

## 2016-10-11 NOTE — Progress Notes (Signed)
Subjective:    Patient ID: Molly Bean, female    DOB: 1960-12-12, 56 y.o.   MRN: [id number]  Chief Complaint  Patient presents with  . Hospitalization Follow-up    wants to talk about panic attacks and her anxiety    HPI:  Molly Bean is a 56 y.o. female who  has a past medical history of DEPRESSION; Edema; GERD; MENOPAUSAL SYNDROME; OBESITY; and UTI'S, CHRONIC. and presents today for a follow up office visit.   Recently evaluated in the emergency department complaining of sudden onset constant shortness of breath with difficulty with walking. Husband at the time noted she began shaking and fell to the floor. She was noted to have significantly weak strength in her bilateral lower extremities with general poor cooperation with neurological exam. Blood work showed a positive lactic acid which was believed to be related to her shaking and improved on retest. She was prescribed Ativan with instructions to follow up. ED records and labs were reviewed in detail.   Since leaving ED she has had one additional attack that she describes as nervious and panic. She reports taking the lorazepam as prescribed and denies adverse side effects. She is not sure what is triggering this as she has the same job and no new stressors. Describes her most recent attack as not as bad as the initial as she was able to breath and relax more than the first time. Feelings of anxiety are occurring 2x per week.   Allergies  Allergen Reactions  . Pennsaid [Diclofenac Sodium] Rash  . Oxycodone     Hallucinations  . Shellfish Allergy       Outpatient Medications Prior to Visit  Medication Sig Dispense Refill  . acetaminophen (TYLENOL) 500 MG tablet Take 1,000 mg by mouth every 6 (six) hours as needed for mild pain or headache.    . diphenhydrAMINE (BENADRYL) 25 mg capsule Take 50 mg by mouth once as needed for allergies. Reported on 11/03/2015    . ibuprofen (ADVIL,MOTRIN) 800 MG tablet Take one at the  onset of HA. 30 tablet 1  . Ibuprofen-Famotidine 800-26.6 MG TABS Take 1 tablet by mouth 3 (three) times daily as needed. 90 tablet 1  . traMADol (ULTRAM) 50 MG tablet Take 1-2 tablets (50-100 mg total) by mouth 2 (two) times daily as needed for moderate pain or severe pain. 60 tablet 1  . albuterol (PROVENTIL HFA;VENTOLIN HFA) 108 (90 BASE) MCG/ACT inhaler Inhale 2 puffs into the lungs every 6 (six) hours as needed for wheezing or shortness of breath. 1 Inhaler 3  . LORazepam (ATIVAN) 1 MG tablet Take 1 tablet (1 mg total) by mouth every 6 (six) hours as needed for anxiety. 10 tablet 0  . predniSONE (STERAPRED UNI-PAK 21 TAB) 10 MG (21) TBPK tablet Take 6 tablets x 1 day, 5 tablets x 1 day, 4 tablets x 1 day, 3 tablets x 1 day, 2 tablets x 1 day, 1 tablet x 1 day 21 tablet 0  . triamterene-hydrochlorothiazide (MAXZIDE-25) 37.5-25 MG tablet TAKE 0.5-1 TABLETS BY MOUTH DAILY. 30 tablet 3   No facility-administered medications prior to visit.       Past Surgical History:  Procedure Laterality Date  . ABDOMINAL HYSTERECTOMY     total fibroids  . CESAREAN SECTION        Past Medical History:  Diagnosis Date  . DEPRESSION   . Edema   . GERD   . MENOPAUSAL SYNDROME   . OBESITY   . UTI'S,  CHRONIC     Review of Systems  Constitutional: Negative for chills and fever.  Respiratory: Negative for chest tightness and shortness of breath.   Neurological: Negative for weakness and numbness.  Psychiatric/Behavioral: The patient is nervous/anxious.       Objective:    BP 112/78 (BP Location: Left Arm, Patient Position: Sitting, Cuff Size: Large)   Pulse 70   Temp 98.4 F (36.9 C) (Oral)   Resp 16   Ht 5\' 2"  (1.575 m)   Wt 222 lb 12.8 oz (101.1 kg)   SpO2 95%   BMI 40.75 kg/m  Nursing note and vital signs reviewed.  Physical Exam  Constitutional: She is oriented to person, place, and time. She appears well-developed and well-nourished. No distress.  Cardiovascular: Normal rate,  regular rhythm, normal heart sounds and intact distal pulses.   Pulmonary/Chest: Effort normal and breath sounds normal.  Neurological: She is alert and oriented to person, place, and time.  Skin: Skin is warm and dry.  Psychiatric: Her behavior is normal. Judgment and thought content normal. Her mood appears anxious.       Assessment & Plan:   Problem List Items Addressed This Visit      Other   Generalized anxiety disorder    This is a new problem. Symptoms and exam are consistent with anxiety and possible panic attacks. Appear stable with lorazepam as needed. Patient declines counseling at this time. If symptoms worsen or frequency increases consider daily medication. Continue current dosage of lorazepam and continue to monitor.       Relevant Medications   LORazepam (ATIVAN) 1 MG tablet       I have discontinued Ms. Abrigo's predniSONE. I have also changed her LORazepam. Additionally, I am having her maintain her diphenhydrAMINE, acetaminophen, ibuprofen, traMADol, Ibuprofen-Famotidine, albuterol, and triamterene-hydrochlorothiazide.   Meds ordered this encounter  Medications  . albuterol (PROVENTIL HFA;VENTOLIN HFA) 108 (90 Base) MCG/ACT inhaler    Sig: Inhale 2 puffs into the lungs every 6 (six) hours as needed for wheezing or shortness of breath.    Dispense:  1 Inhaler    Refill:  3  . LORazepam (ATIVAN) 1 MG tablet    Sig: Take 0.5-1 tablets (0.5-1 mg total) by mouth 2 (two) times daily as needed for anxiety.    Dispense:  30 tablet    Refill:  0  . triamterene-hydrochlorothiazide (MAXZIDE-25) 37.5-25 MG tablet    Sig: Take 0.5-1 tablets by mouth daily.    Dispense:  30 tablet    Refill:  3     Follow-up: Return in about 1 month (around 11/11/2016), or if symptoms worsen or fail to improve.  Mauricio Po, FNP

## 2016-12-02 LAB — HM MAMMOGRAPHY

## 2016-12-08 ENCOUNTER — Encounter: Payer: Self-pay | Admitting: Family

## 2016-12-13 ENCOUNTER — Other Ambulatory Visit: Payer: Self-pay | Admitting: Family

## 2016-12-13 DIAGNOSIS — F411 Generalized anxiety disorder: Secondary | ICD-10-CM

## 2016-12-14 NOTE — Telephone Encounter (Signed)
Rx faxed

## 2016-12-14 NOTE — Telephone Encounter (Signed)
Routing to greg, please advise, thanks 

## 2017-03-10 ENCOUNTER — Other Ambulatory Visit: Payer: Self-pay | Admitting: Internal Medicine

## 2017-06-12 ENCOUNTER — Other Ambulatory Visit: Payer: Self-pay | Admitting: Family

## 2017-06-12 DIAGNOSIS — F411 Generalized anxiety disorder: Secondary | ICD-10-CM

## 2017-06-13 NOTE — Telephone Encounter (Signed)
Database would not pull up pts last refill.

## 2017-06-13 NOTE — Telephone Encounter (Signed)
Medications refilled

## 2017-06-19 ENCOUNTER — Other Ambulatory Visit: Payer: Self-pay | Admitting: Physician Assistant

## 2017-08-30 ENCOUNTER — Telehealth: Payer: Self-pay | Admitting: Nurse Practitioner

## 2017-08-30 ENCOUNTER — Other Ambulatory Visit: Payer: Self-pay | Admitting: *Deleted

## 2017-08-30 DIAGNOSIS — F411 Generalized anxiety disorder: Secondary | ICD-10-CM

## 2017-08-30 NOTE — Telephone Encounter (Signed)
Ativan Refill Request  

## 2017-08-30 NOTE — Telephone Encounter (Signed)
Copied from [place] [id number]. Topic: Quick Communication - See Telephone Encounter >> Aug 30, 2017  4:13 PM Bea Graff, NT wrote: CRM for notification. See Telephone encounter for: Patient needs a refill of her lorazepam 1mg  called into [facility] [address]. Former pt of [employer].  08/30/17.

## 2017-08-30 NOTE — Telephone Encounter (Signed)
Rec'd fax pt requesting refill on her Lorazepam. Rx was denied pt is needing to estab new OV w/new provider for refills...Johny Chess

## 2017-08-31 ENCOUNTER — Telehealth: Payer: Self-pay

## 2017-08-31 NOTE — Telephone Encounter (Signed)
Pt has an establish care appointment with you on 11/01/16. Last refill was 06/13/17 per [place] [facility] for a quantity of 60 for a 30 day supply. Please advise if you are ok to refill.

## 2017-08-31 NOTE — Telephone Encounter (Signed)
Copied from [place] [phone]. Topic: Quick Communication - See Telephone Encounter >> Aug 30, 2017  4:13 PM Bea Graff, NT wrote: CRM for notification. See Telephone encounter for: Patient needs a refill of her lorazepam 1mg  [facility] [address]. Former pt of [employer].  08/30/17. >> Aug 31, 2017 11:46 AM Yvette Rack wrote: Patient calling back about Ativan refill [facility]

## 2017-08-31 NOTE — Telephone Encounter (Signed)
Spoke with pt and advised of the response below. Pt understood and appointment has been made to discuss refill.

## 2017-09-05 ENCOUNTER — Ambulatory Visit: Payer: BC Managed Care – PPO | Admitting: Nurse Practitioner

## 2017-09-05 ENCOUNTER — Encounter: Payer: Self-pay | Admitting: Nurse Practitioner

## 2017-09-05 VITALS — BP 124/78 | HR 60 | Temp 98.8°F | Resp 16 | Ht 62.0 in | Wt 211.0 lb

## 2017-09-05 DIAGNOSIS — F411 Generalized anxiety disorder: Secondary | ICD-10-CM | POA: Diagnosis not present

## 2017-09-05 MED ORDER — SERTRALINE HCL 25 MG PO TABS: 25.0000 mg | ORAL_TABLET | Freq: Every day | ORAL | 2 refills | Status: DC

## 2017-09-05 NOTE — Patient Instructions (Signed)
Please start 1/2 tablet once daily for 1 week and then increase to a full tablet once daily on week two as tolerated.  Common side effects such as nausea, drowsiness and weight gain can occur.  Also, rarely patients have experienced suicide ideation when taking this.  Discontinue the medication and go directly to ED if this. Lets follow up in about 1 month to see how you are doing.  It was nice to meet you. Thanks for letting me take care of you today :)

## 2017-09-05 NOTE — Assessment & Plan Note (Signed)
Generalized anxiety disorder We discussed the risks of long-term benzodiazepine therapy and will not resume. She would like to try a daily medication for anxiety. - sertraline (ZOLOFT) 25 MG tablet; Take 1 tablet (25 mg total) by mouth daily. Start with 1/2 tablet once daily on first week  Dispense: 30 tablet; Refill: 2  I instructed pt to start 1/2 tablet once daily for 1 week and then increase to a full tablet once daily on week two as tolerated.  We discussed common side effects such as nausea, drowsiness and weight gain.  Also discussed rare but serious side effect of suicide ideation.  She is instructed to discontinue medication go directly to ED if this occurs.  Pt verbalizes understanding.  Plan follow up in 1 month to evaluate progress.   [place] continue marriage counseling with spouse

## 2017-09-05 NOTE — Progress Notes (Addendum)
Subjective:    Patient ID: Molly Bean, female    DOB: [birth year], 56 y.o.   MRN: [id number]  HPI Molly Bean is a 56 yo female who presents today to establish care. She is transferring to me from another provider in the same clinic.  Anxiety- has been maintained on lorazepam prn. She ran out about 2 weeks ago. She was not taking the medication daily. She reports continued anxiety daily related to marriage issues. She crys and worries daily to the point that she is nauseated. She feels nervous constantly. She developed the anxiety when her marriage issues began. She and her husband started marriage counseling last week and she is hopeful this will improve her anxiety. shes not had any thoughts of hurting herself or others.  PHQ9 score today-8 GAD7 score today-13  Review of Systems  See HPI  Past Medical History:  Diagnosis Date  . DEPRESSION   . Edema   . GERD   . MENOPAUSAL SYNDROME   . OBESITY   . UTI'S, CHRONIC      Social History   Socioeconomic History  . Marital status: Married    Spouse name: Not on file  . Number of children: Not on file  . Years of education: Not on file  . Highest education level: Not on file  Social Needs  . Financial resource strain: Not on file  . Food insecurity - worry: Not on file  . Food insecurity - inability: Not on file  . Transportation needs - medical: Not on file  . Transportation needs - non-medical: Not on file  Occupational History  . Not on file  Tobacco Use  . Smoking status: Former Smoker    Packs/day: 0.30    Years: 20.00    Pack years: 6.00    Types: Cigarettes  . Smokeless tobacco: Former Systems developer    Quit date: 10/05/1987  Substance and Sexual Activity  . Alcohol use: No  . Drug use: No  . Sexual activity: Not on file  Other Topics Concern  . Not on file  Social History Narrative  . Not on file    Past Surgical History:  Procedure Laterality Date  . ABDOMINAL HYSTERECTOMY     total fibroids  . CESAREAN  SECTION      Family History  Problem Relation Age of Onset  . Coronary artery disease Mother   . Hypertension Mother   . Diabetes Mother   . Atrial fibrillation Mother   . Seizures Father   . Lupus Cousin   . Breast cancer Sister 68    Allergies  Allergen Reactions  . Pennsaid [Diclofenac Sodium] Rash  . Oxycodone     Hallucinations  . Shellfish Allergy     Current Outpatient Medications on File Prior to Visit  Medication Sig Dispense Refill  . acetaminophen (TYLENOL) 500 MG tablet Take 1,000 mg by mouth every 6 (six) hours as needed for mild pain or headache.    . albuterol (PROVENTIL HFA;VENTOLIN HFA) 108 (90 Base) MCG/ACT inhaler Inhale 2 puffs into the lungs every 6 (six) hours as needed for wheezing or shortness of breath. 1 Inhaler 3  . diphenhydrAMINE (BENADRYL) 25 mg capsule Take 50 mg by mouth once as needed for allergies. Reported on 11/03/2015    . ibuprofen (ADVIL,MOTRIN) 800 MG tablet Take one at the onset of HA. 30 tablet 1  . Ibuprofen-Famotidine 800-26.6 MG TABS Take 1 tablet by mouth 3 (three) times daily as needed. 90 tablet 1  .  LORazepam (ATIVAN) 1 MG tablet TAKE 1/2 TO 1 TABLET TWICE A DAY AS NEEDED FOR ANXIETY 60 tablet 0  . traMADol (ULTRAM) 50 MG tablet Take 1-2 tablets (50-100 mg total) by mouth 2 (two) times daily as needed for moderate pain or severe pain. 60 tablet 1  . triamterene-hydrochlorothiazide (MAXZIDE-25) 37.5-25 MG tablet Take 0.5-1 tablets by mouth daily. 30 tablet 3  . triamterene-hydrochlorothiazide (MAXZIDE-25) 37.5-25 MG tablet TAKE 1/2 TO 1 TABLET DAILY 30 tablet 3   No current facility-administered medications on file prior to visit.     BP 124/78 (BP Location: Left Arm, Patient Position: Sitting, Cuff Size: Large)   Pulse 60   Temp 98.8 F (37.1 C) (Oral)   Resp 16   Ht 5\' 2"  (1.575 m)   Wt 211 lb (95.7 kg)   SpO2 97%   BMI 38.59 kg/m      Objective:   Physical Exam  Constitutional: She is oriented to person, place, and  time. She appears well-developed and well-nourished. No distress.  HENT:  Head: Normocephalic and atraumatic.  Cardiovascular: Normal rate, regular rhythm, normal heart sounds and intact distal pulses.  Pulmonary/Chest: Effort normal and breath sounds normal.  Neurological: She is alert and oriented to person, place, and time. Coordination normal.  Skin: Skin is warm and dry.  Psychiatric: She has a normal mood and affect. Her behavior is normal. Judgment and thought content normal.      Assessment & Plan:

## 2017-09-19 ENCOUNTER — Ambulatory Visit: Payer: BC Managed Care – PPO | Admitting: Nurse Practitioner

## 2017-10-31 ENCOUNTER — Other Ambulatory Visit: Payer: Self-pay

## 2017-10-31 DIAGNOSIS — F411 Generalized anxiety disorder: Secondary | ICD-10-CM

## 2017-10-31 MED ORDER — SERTRALINE HCL 25 MG PO TABS: 25.0000 mg | ORAL_TABLET | Freq: Every day | ORAL | 0 refills | Status: DC

## 2017-11-01 ENCOUNTER — Encounter: Payer: BC Managed Care – PPO | Admitting: Nurse Practitioner

## 2018-01-18 ENCOUNTER — Ambulatory Visit: Payer: BC Managed Care – PPO | Admitting: Nurse Practitioner

## 2018-01-18 ENCOUNTER — Encounter: Payer: Self-pay | Admitting: Nurse Practitioner

## 2018-01-18 ENCOUNTER — Encounter: Payer: Self-pay | Admitting: Gastroenterology

## 2018-01-18 VITALS — BP 126/78 | HR 66 | Temp 98.6°F | Resp 16 | Ht 62.0 in | Wt 217.0 lb

## 2018-01-18 DIAGNOSIS — L732 Hidradenitis suppurativa: Secondary | ICD-10-CM

## 2018-01-18 DIAGNOSIS — R05 Cough: Secondary | ICD-10-CM

## 2018-01-18 DIAGNOSIS — K625 Hemorrhage of anus and rectum: Secondary | ICD-10-CM

## 2018-01-18 DIAGNOSIS — R059 Cough, unspecified: Secondary | ICD-10-CM

## 2018-01-18 MED ORDER — TRIAMTERENE-HCTZ 37.5-25 MG PO TABS: 0.5000 | ORAL_TABLET | Freq: Every day | ORAL | 1 refills | Status: DC

## 2018-01-18 MED ORDER — AMOXICILLIN-POT CLAVULANATE 875-125 MG PO TABS: 1.0000 | ORAL_TABLET | Freq: Two times a day (BID) | ORAL | 0 refills | Status: DC

## 2018-01-18 MED ORDER — ALBUTEROL SULFATE HFA 108 (90 BASE) MCG/ACT IN AERS: 2.0000 | INHALATION_SPRAY | Freq: Four times a day (QID) | RESPIRATORY_TRACT | 3 refills | Status: DC | PRN

## 2018-01-18 NOTE — Progress Notes (Signed)
Name: Molly Bean   MRN: [id number]    DOB: 07-Jun-1961   Date:01/18/2018       Progress Note  Subjective  Chief Complaint  Chief Complaint  Patient presents with  . Cough    weak, headache, productive cough, wheezing, also has boils that keep showing up lower abdomen area wants inhaler refilled    HPI  Cough-  This is a new problem Her symptoms began yesterday. She c/o malaise, headaches, sore throat, nasal congestion, cough with yellow sputum, wheezing. She denies chest pain, edema. She had albuterol inhaler in the past for mild asthma but ran out and would like a refill.  Boils-  She c/o several inflamed bumps to external vagina. The bumps are tender to touch. She notices these bumps frequently- she stopped shaving her vagina and the bumps have seemed to get worse. She denies any vaginal discharge or bleeding, erythema, drainage. She is not concerned for STD's  Rectal bleeding- This is a new problem She saw blood in her stool yesterday after a bowel movement, blood also was noted on toilet paper She had just eaten a red peppermint stick. She has not noticed any rectal bleeding before that. She denies any dizziness, abdominal pain, nausea, vomiting, rectal pain. She reports frequent constipation- typically has bowel movements about twice per week with hard stools. Last colonoscopy was 2010    Patient Active Problem List   Diagnosis Date Noted  . Generalized anxiety disorder 10/11/2016  . Near syncope   . MENOPAUSAL SYNDROME 04/23/2009  . OBESITY 02/15/2008  . DEPRESSION 04/12/2007  . GERD 04/12/2007  . UTI'S, CHRONIC 04/12/2007  . EDEMA 04/12/2007  . HEADACHE 04/12/2007    Social History   Tobacco Use  . Smoking status: Former Smoker    Packs/day: 0.30    Years: 20.00    Pack years: 6.00    Types: Cigarettes  . Smokeless tobacco: Former Systems developer    Quit date: 10/05/1987  Substance Use Topics  . Alcohol use: No     Current Outpatient Medications:  .   acetaminophen (TYLENOL) 500 MG tablet, Take 1,000 mg by mouth every 6 (six) hours as needed for mild pain or headache., Disp: , Rfl:  .  albuterol (PROVENTIL HFA;VENTOLIN HFA) 108 (90 Base) MCG/ACT inhaler, Inhale 2 puffs into the lungs every 6 (six) hours as needed for wheezing or shortness of breath., Disp: 1 Inhaler, Rfl: 3 .  sertraline (ZOLOFT) 25 MG tablet, Take 1 tablet (25 mg total) by mouth daily., Disp: 90 tablet, Rfl: 0 .  triamterene-hydrochlorothiazide (MAXZIDE-25) 37.5-25 MG tablet, Take 0.5-1 tablets by mouth daily., Disp: 90 tablet, Rfl: 1  Allergies  Allergen Reactions  . Pennsaid [Diclofenac Sodium] Rash  . Oxycodone     Hallucinations  . Shellfish Allergy     ROS  No other specific complaints in a complete review of systems (except as listed in HPI above).  Objective  Vitals:   01/18/18 0937  BP: 126/78  Pulse: 66  Resp: 16  Temp: 98.6 F (37 C)  TempSrc: Oral  SpO2: 97%  Weight: 217 lb (98.4 kg)  Height: 5\' 2"  (1.575 m)   Body mass index is 39.69 kg/m.  Nursing Note and Vital Signs reviewed.  Physical Exam Constitutional: Patient appears well-developed and well-nourished. Obese. No distress.  HEENT: head atraumatic, normocephalic, pupils equal and reactive to light, EOM's intact, neck supple without lymphadenopathy, oropharynx pink and moist without exudate Cardiovascular: Normal rate, regular rhythm, S1/S2 present.  No murmur or rub  heard. No BLE edema. Pulmonary/Chest: Effort normal and breath sounds clear. No respiratory distress or retractions. Abdominal: Soft and non-tender, bowel sounds present. Skin: scattered inflamed nodules to external vagina, tender to touch, no erythema or drainage noted Psychiatric: Patient has a normal mood and affect. behavior is normal. Judgment and thought content normal.  External vaginal exam done with chaperone present in exam room  Assessment & Plan RTC in about 1 month for CPE  Cough 1 day of symptoms- vital  signs and PE are normal, could be viral URI Discussed home management of symptoms including return precautions and printed in AVS  - albuterol (PROVENTIL HFA;VENTOLIN HFA) 108 (90 Base) MCG/ACT inhaler; Inhale 2 puffs into the lungs every 6 (six) hours as needed for wheezing or shortness of breath.  Dispense: 1 Inhaler; Refill: 3 F/U for new or worsening symptoms  Rectal bleeding Could be related to constipation but will refer to GI to update colonoscopy. Discussed home management of constipation and return precautions and printed in AVS  - Ambulatory referral to Gastroenterology

## 2018-01-18 NOTE — Assessment & Plan Note (Signed)
Will Rx with course of augmentin-dosing and side effects discussed Discussed nature of condition and home management of symptoms including return precautions and printed information in AVS  Consider referral to GYN for further evaluation If no improvement - amoxicillin-clavulanate (AUGMENTIN) 875-125 MG tablet; Take 1 tablet by mouth 2 (two) times daily.  Dispense: 10 tablet; Refill: 0 F/U for new or worsening symptoms

## 2018-01-18 NOTE — Patient Instructions (Addendum)
For your bumps, Please start augmentin twice daily for 5 days. You can use antibacterial soap purchased from the store and avoid shaving as well.  For your cough, I have sent albuterol inhaler 1-2 puffs every 4-6 hours. If your symptoms have not improved or you start feeling worse in 1 week, please follow up.  I have placed a referral to gastroenterology for your colonoscopy. Our office will call you to schedule this appointment. You should hear from our office in 7-10 days.  Please return in about 1 month for annual physical.  Hidradenitis Suppurativa Hidradenitis suppurativa is a long-term (chronic) skin disease that starts with blocked sweat glands or hair follicles. Bacteria may grow in these blocked openings of your skin. Hidradenitis suppurativa is like a severe form of acne that develops in areas of your body where acne would be unusual. It is most likely to affect the areas of your body where skin rubs against skin and becomes moist. This includes your:  Underarms.  Groin.  Genital areas.  Buttocks.  Upper thighs.  Breasts.  Hidradenitis suppurativa may start out with small pimples. The pimples can develop into deep sores that break open (rupture) and drain pus. Over time your skin may thicken and become scarred. Hidradenitis suppurativa cannot be passed from person to person. What are the causes? The exact cause of hidradenitis suppurativa is not known. This condition may be due to:  Female and female hormones. The condition is rare before and after puberty.  An overactive body defense system (immune system). Your immune system may overreact to the blocked hair follicles or sweat glands and cause swelling and pus-filled sores.  What increases the risk? You may have a higher risk of hidradenitis suppurativa if you:  Are a woman.  Are between ages 47 and 68.  Have a family history of hidradenitis suppurativa.  Have a personal history of acne.  Are  overweight.  Smoke.  Take the drug lithium.  What are the signs or symptoms? The first signs of an outbreak are usually painful skin bumps that look like pimples. As the condition progresses:  Skin bumps may get bigger and grow deeper into the skin.  Bumps under the skin may rupture and drain smelly pus.  Skin may become itchy and infected.  Skin may thicken and scar.  Drainage may continue through tunnels under the skin (fistulas).  Walking and moving your arms can become painful.  How is this diagnosed? Your health care provider may diagnose hidradenitis suppurativa based on your medical history and your signs and symptoms. A physical exam will also be done. You may need to see a health care provider who specializes in skin diseases (dermatologist). You may also have tests done to confirm the diagnosis. These can include:  Swabbing a sample of pus or drainage from your skin so it can be sent to the lab and tested for infection.  Blood tests to check for infection.  How is this treated? The same treatment will not work for everybody with hidradenitis suppurativa. Your treatment will depend on how severe your symptoms are. You may need to try several treatments to find what works best for you. Part of your treatment may include cleaning and bandaging (dressing) your wounds. You may also have to take medicines, such as the following:  Antibiotics.  Acne medicines.  Medicines to block or suppress the immune system.  A diabetes medicine (metformin) is sometimes used to treat this condition.  For women, birth control pills can  sometimes help relieve symptoms.  You may need surgery if you have a severe case of hidradenitis suppurativa that does not respond to medicine. Surgery may involve:  Using a laser to clear the skin and remove hair follicles.  Opening and draining deep sores.  Removing the areas of skin that are diseased and scarred.  Follow these instructions at  home:  Learn as much as you can about your disease, and work closely with your health care providers.  Take medicines only as directed by your health care provider.  If you were prescribed an antibiotic medicine, finish it all even if you start to feel better.  If you are overweight, losing weight may be very helpful. Try to reach and maintain a healthy weight.  Do not use any tobacco products, including cigarettes, chewing tobacco, or electronic cigarettes. If you need help quitting, ask your health care provider.  Do not shave the areas where you get hidradenitis suppurativa.  Do not wear deodorant.  Wear loose-fitting clothes.  Try not to overheat and get sweaty.  Take a daily bleach bath as directed by your health care provider. ? Fill your bathtub halfway with water. ? Pour in  cup of unscented household bleach. ? Soak for 5-10 minutes.  Cover sore areas with a warm, clean washcloth (compress) for 5-10 minutes. Contact a health care provider if:  You have a flare-up of hidradenitis suppurativa.  You have chills or a fever.  You are having trouble controlling your symptoms at home. This information is not intended to replace advice given to you by your health care provider. Make sure you discuss any questions you have with your health care provider. Document Released: 05/04/2004 Document Revised: 02/26/2016 Document Reviewed: 12/21/2013 Elsevier Interactive Patient Education  2018 [employer].   Constipation, Adult Constipation is when a person:  Poops (has a bowel movement) fewer times in a week than normal.  Has a hard time pooping.  Has poop that is dry, hard, or bigger than normal.  Follow these instructions at home: Eating and drinking   Eat foods that have a lot of fiber, such as: ? Fresh fruits and vegetables. ? Whole grains. ? Beans.  Eat less of foods that are high in fat, low in fiber, or overly processed, such as: ? [place]  fries. ? Hamburgers. ? Cookies. ? Candy. ? Soda.  Drink enough fluid to keep your pee (urine) clear or pale yellow. General instructions  Exercise regularly or as told by your doctor.  Go to the restroom when you feel like you need to poop. Do not hold it in.  Take over-the-counter and prescription medicines only as told by your doctor. These include any fiber supplements.  Do pelvic floor retraining exercises, such as: ? Doing deep breathing while relaxing your lower belly (abdomen). ? Relaxing your pelvic floor while pooping.  Watch your condition for any changes.  Keep all follow-up visits as told by your doctor. This is important. Contact a doctor if:  You have pain that gets worse.  You have a fever.  You have not pooped for 4 days.  You throw up (vomit).  You are not hungry.  You lose weight.  You are bleeding from the anus.  You have thin, pencil-like poop (stool). Get help right away if:  You have a fever, and your symptoms suddenly get worse.  You leak poop or have blood in your poop.  Your belly feels hard or bigger than normal (is bloated).  You  have very bad belly pain.  You feel dizzy or you faint. This information is not intended to replace advice given to you by your health care provider. Make sure you discuss any questions you have with your health care provider. Document Released: 03/08/2008 Document Revised: 04/09/2016 Document Reviewed: 03/10/2016 Elsevier Interactive Patient Education  2018 [employer].   Upper Respiratory Infection, Adult Most upper respiratory infections (URIs) are caused by a virus. A URI affects the nose, throat, and upper air passages. The most common type of URI is often called "the common cold." Follow these instructions at home:  Take medicines only as told by your doctor.  Gargle warm saltwater or take cough drops to comfort your throat as told by your doctor.  Use a warm mist humidifier or inhale steam  from a shower to increase air moisture. This may make it easier to breathe.  Drink enough fluid to keep your pee (urine) clear or pale yellow.  Eat soups and other clear broths.  Have a healthy diet.  Rest as needed.  Go back to work when your fever is gone or your doctor says it is okay. ? You may need to stay home longer to avoid giving your URI to others. ? You can also wear a face mask and wash your hands often to prevent spread of the virus.  Use your inhaler more if you have asthma.  Do not use any tobacco products, including cigarettes, chewing tobacco, or electronic cigarettes. If you need help quitting, ask your doctor. Contact a doctor if:  You are getting worse, not better.  Your symptoms are not helped by medicine.  You have chills.  You are getting more short of breath.  You have brown or red mucus.  You have yellow or brown discharge from your nose.  You have pain in your face, especially when you bend forward.  You have a fever.  You have puffy (swollen) neck glands.  You have pain while swallowing.  You have white areas in the back of your throat. Get help right away if:  You have very bad or constant: ? Headache. ? Ear pain. ? Pain in your forehead, behind your eyes, and over your cheekbones (sinus pain). ? Chest pain.  You have long-lasting (chronic) lung disease and any of the following: ? Wheezing. ? Long-lasting cough. ? Coughing up blood. ? A change in your usual mucus.  You have a stiff neck.  You have changes in your: ? Vision. ? Hearing. ? Thinking. ? Mood. This information is not intended to replace advice given to you by your health care provider. Make sure you discuss any questions you have with your health care provider. Document Released: 03/08/2008 Document Revised: 05/23/2016 Document Reviewed: 12/26/2013 Elsevier Interactive Patient Education  2018 [employer].

## 2018-01-23 ENCOUNTER — Ambulatory Visit: Payer: BC Managed Care – PPO | Admitting: Nurse Practitioner

## 2018-01-28 ENCOUNTER — Other Ambulatory Visit: Payer: Self-pay | Admitting: Nurse Practitioner

## 2018-01-28 DIAGNOSIS — F411 Generalized anxiety disorder: Secondary | ICD-10-CM

## 2018-01-31 ENCOUNTER — Ambulatory Visit: Payer: BC Managed Care – PPO | Admitting: Nurse Practitioner

## 2018-03-06 ENCOUNTER — Ambulatory Visit (INDEPENDENT_AMBULATORY_CARE_PROVIDER_SITE_OTHER): Payer: BC Managed Care – PPO | Admitting: Gastroenterology

## 2018-03-06 ENCOUNTER — Encounter: Payer: Self-pay | Admitting: Gastroenterology

## 2018-03-06 VITALS — BP 128/74 | HR 68 | Ht 62.0 in | Wt 218.5 lb

## 2018-03-06 DIAGNOSIS — K625 Hemorrhage of anus and rectum: Secondary | ICD-10-CM

## 2018-03-06 MED ORDER — SUPREP BOWEL PREP KIT 17.5-3.13-1.6 GM/177ML PO SOLN: 1.0000 | ORAL | 0 refills | Status: DC

## 2018-03-06 NOTE — Progress Notes (Signed)
HPI: This is a  very pleasant 57 year old woman who was referred to me by Lance Sell, NP  to evaluate constipation, rectal bleeding.    Chief complaint is chronic constipation, minor recent rectal bleeding  Saw bright red rectal bleeding 1 month on one time.  Always constipated, no real change for her.  She has tried fiber supplements in the past and these did not help.  She has tried laxatives in the past and actually had melanosis noted in her colon many years ago.  Currently she just does a lot of pushing and straining and for rescue she will take magnesium citrate.  Has BM 3 times per week usually with a lot of effort.  Her weight has been overall stable Colon cancer does not run in her family   Old Data Reviewed: Colonoscopy August 2010 for routine risk screening, Dr. Owens Loffler.  Findings melanosis, otherwise normal examination.  She was recommended to have repeat colonoscopy for colon cancer screening at 10-year interval    Review of systems: Pertinent positive and negative review of systems were noted in the above HPI section. All other review negative.   Past Medical History:  Diagnosis Date  . DEPRESSION   . Edema   . GERD   . MENOPAUSAL SYNDROME   . OBESITY   . UTI'S, CHRONIC     Past Surgical History:  Procedure Laterality Date  . ABDOMINAL HYSTERECTOMY     total fibroids  . CESAREAN SECTION      Current Outpatient Medications  Medication Sig Dispense Refill  . acetaminophen (TYLENOL) 500 MG tablet Take 1,000 mg by mouth every 6 (six) hours as needed for mild pain or headache.    . albuterol (PROVENTIL HFA;VENTOLIN HFA) 108 (90 Base) MCG/ACT inhaler Inhale 2 puffs into the lungs every 6 (six) hours as needed for wheezing or shortness of breath. 1 Inhaler 3  . sertraline (ZOLOFT) 25 MG tablet TAKE 1 TABLET BY MOUTH EVERY DAY 90 tablet 0  . triamterene-hydrochlorothiazide (MAXZIDE-25) 37.5-25 MG tablet Take 0.5-1 tablets by mouth daily. 90 tablet 1    No current facility-administered medications for this visit.     Allergies as of 03/06/2018 - Review Complete 03/06/2018  Allergen Reaction Noted  . Pennsaid [diclofenac sodium] Rash 06/21/2016  . Oxycodone  03/09/2014  . Shellfish allergy  09/23/2014    Family History  Problem Relation Age of Onset  . Coronary artery disease Mother   . Hypertension Mother   . Diabetes Mother   . Atrial fibrillation Mother   . Seizures Father   . Lupus Cousin   . Breast cancer Sister 38    Social History   Socioeconomic History  . Marital status: Married    Spouse name: Not on file  . Number of children: Not on file  . Years of education: Not on file  . Highest education level: Not on file  Occupational History  . Not on file  Social Needs  . Financial resource strain: Not on file  . Food insecurity:    Worry: Not on file    Inability: Not on file  . Transportation needs:    Medical: Not on file    Non-medical: Not on file  Tobacco Use  . Smoking status: Former Smoker    Packs/day: 0.30    Years: 20.00    Pack years: 6.00    Types: Cigarettes  . Smokeless tobacco: Former Systems developer    Quit date: 10/05/1987  Substance and Sexual Activity  .  Alcohol use: No  . Drug use: No  . Sexual activity: Not on file  Lifestyle  . Physical activity:    Days per week: Not on file    Minutes per session: Not on file  . Stress: Not on file  Relationships  . Social connections:    Talks on phone: Not on file    Gets together: Not on file    Attends religious service: Not on file    Active member of club or organization: Not on file    Attends meetings of clubs or organizations: Not on file    Relationship status: Not on file  . Intimate partner violence:    Fear of current or ex partner: Not on file    Emotionally abused: Not on file    Physically abused: Not on file    Forced sexual activity: Not on file  Other Topics Concern  . Not on file  Social History Narrative  . Not on file      Physical Exam: BP 128/74   Pulse 68   Ht 5\' 2"  (1.575 m)   Wt 218 lb 8 oz (99.1 kg)   BMI 39.96 kg/m  Constitutional: generally well-appearing Psychiatric: alert and oriented x3 Eyes: extraocular movements intact Mouth: oral pharynx moist, no lesions Neck: supple no lymphadenopathy Cardiovascular: heart regular rate and rhythm Lungs: clear to auscultation bilaterally Abdomen: soft, nontender, nondistended, no obvious ascites, no peritoneal signs, normal bowel sounds Extremities: no lower extremity edema bilaterally Skin: no lesions on visible extremities Rectal exam deferred for upcoming colonoscopy  Assessment and plan: 57 y.o. female with mild chronic constipation, recent rectal bleeding  First for her mild chronic constipation I recommended she try MiraLAX powder.  1 dose once daily with at least a 3 to 4-week trial.  Her rectal bleeding sounds hemorrhoidal in origin.  It has been almost 9 years since her last colonoscopy and I recommended we repeat that now for her to exclude other, more serious potential causes such as neoplasm.   Please see the "Patient Instructions" section for addition details about the plan.   Owens Loffler, MD [facility] 03/06/2018, 2:15 PM  Cc: Lance Sell, NP

## 2018-03-06 NOTE — Patient Instructions (Addendum)
Start miralax powder, one dose once daily. You will be set up for a colonoscopy for rectal bleeding.  Normal BMI (Body Mass Index- based on height and weight) is between 19 and 25. Your BMI today is Body mass index is 39.96 kg/m. Marland Kitchen Please consider follow up  regarding your BMI with your Primary Care Provider.

## 2018-03-14 ENCOUNTER — Telehealth: Payer: Self-pay | Admitting: Nurse Practitioner

## 2018-03-14 DIAGNOSIS — J45909 Unspecified asthma, uncomplicated: Secondary | ICD-10-CM

## 2018-03-14 NOTE — Telephone Encounter (Signed)
Her pharmacist sent me a notification that she had refilled her albuterol rescue inhaler multiple times recently. I would like for her to schedule an OV to discuss her asthma , so that we can make sure we have the safest treatment plan for her

## 2018-03-15 NOTE — Telephone Encounter (Signed)
Appointment has been made to discuss asthma.

## 2018-03-24 ENCOUNTER — Ambulatory Visit (INDEPENDENT_AMBULATORY_CARE_PROVIDER_SITE_OTHER): Payer: BC Managed Care – PPO | Admitting: Nurse Practitioner

## 2018-03-24 ENCOUNTER — Ambulatory Visit (INDEPENDENT_AMBULATORY_CARE_PROVIDER_SITE_OTHER)
Admission: RE | Admit: 2018-03-24 | Discharge: 2018-03-24 | Disposition: A | Discharge status: Home / Self Care | Source: Ambulatory Visit | Attending: Nurse Practitioner | Admitting: Nurse Practitioner

## 2018-03-24 ENCOUNTER — Encounter: Payer: Self-pay | Admitting: Nurse Practitioner

## 2018-03-24 VITALS — BP 124/80 | HR 74 | Temp 99.5°F | Resp 18 | Ht 62.0 in | Wt 221.0 lb

## 2018-03-24 DIAGNOSIS — K219 Gastro-esophageal reflux disease without esophagitis: Secondary | ICD-10-CM | POA: Diagnosis not present

## 2018-03-24 DIAGNOSIS — R0602 Shortness of breath: Secondary | ICD-10-CM

## 2018-03-24 DIAGNOSIS — R05 Cough: Secondary | ICD-10-CM

## 2018-03-24 DIAGNOSIS — R059 Cough, unspecified: Secondary | ICD-10-CM

## 2018-03-24 LAB — POCT EXHALED NITRIC OXIDE: FENO LEVEL (PPB): 7

## 2018-03-24 MED ORDER — FLUTICASONE FUROATE-VILANTEROL 100-25 MCG/INH IN AEPB: 1.0000 | INHALATION_SPRAY | Freq: Every day | RESPIRATORY_TRACT | 1 refills | Status: DC

## 2018-03-24 MED ORDER — PANTOPRAZOLE SODIUM 40 MG PO TBEC
40.0000 mg | DELAYED_RELEASE_TABLET | Freq: Every day | ORAL | 1 refills | Status: DC
Start: 2018-03-24 — End: 2018-04-15

## 2018-03-24 NOTE — Patient Instructions (Addendum)
Please head downstairs for x-rays. If any of your test results are critically abnormal, you will be contacted right away. Otherwise, I will contact you within a week about your test results and any recommendations for abnormalities.  Please start breo inhaler one inhalation once daily.  Please start protonix 40 mg once daily.  I have placed a referral to cardiology. Our office will call you to schedule this appointment. You should hear from our office in 7-10 days.  Please return in about 1 month for Follow up.   Food Choices for Gastroesophageal Reflux Disease, Adult When you have gastroesophageal reflux disease (GERD), the foods you eat and your eating habits are very important. Choosing the right foods can help ease your discomfort. What guidelines do I need to follow?  Choose fruits, vegetables, whole grains, and low-fat dairy products.  Choose low-fat meat, fish, and poultry.  Limit fats such as oils, salad dressings, butter, nuts, and avocado.  Keep a food diary. This helps you identify foods that cause symptoms.  Avoid foods that cause symptoms. These may be different for everyone.  Eat small meals often instead of 3 large meals a day.  Eat your meals slowly, in a place where you are relaxed.  Limit fried foods.  Cook foods using methods other than frying.  Avoid drinking alcohol.  Avoid drinking large amounts of liquids with your meals.  Avoid bending over or lying down until 2-3 hours after eating. What foods are not recommended? These are some foods and drinks that may make your symptoms worse: Vegetables Tomatoes. Tomato juice. Tomato and spaghetti sauce. Chili peppers. Onion and garlic. Horseradish. Fruits Oranges, grapefruit, and lemon (fruit and juice). Meats High-fat meats, fish, and poultry. This includes hot dogs, ribs, ham, sausage, salami, and bacon. Dairy Whole milk and chocolate milk. Sour cream. Cream. Butter. Ice cream. Cream  cheese. Drinks Coffee and tea. Bubbly (carbonated) drinks or energy drinks. Condiments Hot sauce. Barbecue sauce. Sweets/Desserts Chocolate and cocoa. Donuts. Peppermint and spearmint. Fats and Oils High-fat foods. This includes [place] fries and potato chips. Other Vinegar. Strong spices. This includes black pepper, white pepper, red pepper, cayenne, curry powder, cloves, ginger, and chili powder. The items listed above may not be a complete list of foods and drinks to avoid. Contact your dietitian for more information. This information is not intended to replace advice given to you by your health care provider. Make sure you discuss any questions you have with your health care provider. Document Released: 03/21/2012 Document Revised: 02/26/2016 Document Reviewed: 07/25/2013 Elsevier Interactive Patient Education  2017 [employer].

## 2018-03-24 NOTE — Assessment & Plan Note (Signed)
Discussed home management of heartburn including lifestyle modifications and food triggers and printed additional information on AVS Will start trial of protonix- dosing and side effects discussed RTC in 1 month for F/U - pantoprazole (PROTONIX) 40 MG tablet; Take 1 tablet (40 mg total) by mouth daily.  Dispense: 30 tablet; Refill: 1

## 2018-03-24 NOTE — Progress Notes (Signed)
Name: Molly Bean   MRN: [id number]    DOB: 06-Aug-1961   Date:03/24/2018       Progress Note  Subjective  Chief Complaint  Chief Complaint  Patient presents with  . Follow-up    states she is SOB and fatigue    HPI  Molly Bean is here today for an asthma follow up. She was asked to come in after her pharmacy notified our office that she was refilling her albuterol frequently. I saw her in April and she asked for a refill of her albuterol inhaler for asthma and acute cough, suspected her acute cough was likely related to viral URI at that visit. Today, she says her cough never resolved, she has continued to cough clear sputum daliy. She has also been waking at night unable to catch her breath, and feeling fatigued. Using her albuterol 1-2 times per night for relief. She has noticed feeling short of breath with activity, like when walking across the office and when climbing stairs. She reports occasional headaches, mild lower extremity edema. She denies weakness, syncope, fever, chills, sneezing, watery eyes, sore throat, heartburn, chest pain, nausea, vomiting. She is a former smoker, smoked for years, quit 30 years ago.   Patient Active Problem List   Diagnosis Date Noted  . Asthma 03/14/2018  . Hidradenitis suppurativa 01/18/2018  . Generalized anxiety disorder 10/11/2016  . Near syncope   . MENOPAUSAL SYNDROME 04/23/2009  . OBESITY 02/15/2008  . DEPRESSION 04/12/2007  . GERD 04/12/2007  . UTI'S, CHRONIC 04/12/2007  . EDEMA 04/12/2007  . HEADACHE 04/12/2007    Social History   Tobacco Use  . Smoking status: Former Smoker    Packs/day: 0.30    Years: 20.00    Pack years: 6.00    Types: Cigarettes  . Smokeless tobacco: Former Systems developer    Quit date: 10/05/1987  Substance Use Topics  . Alcohol use: No     Current Outpatient Medications:  .  acetaminophen (TYLENOL) 500 MG tablet, Take 1,000 mg by mouth every 6 (six) hours as needed for mild pain or headache., Disp: ,  Rfl:  .  albuterol (PROVENTIL HFA;VENTOLIN HFA) 108 (90 Base) MCG/ACT inhaler, Inhale 2 puffs into the lungs every 6 (six) hours as needed for wheezing or shortness of breath., Disp: 1 Inhaler, Rfl: 3 .  sertraline (ZOLOFT) 25 MG tablet, TAKE 1 TABLET BY MOUTH EVERY DAY, Disp: 90 tablet, Rfl: 0 .  SUPREP BOWEL PREP KIT 17.5-3.13-1.6 GM/177ML SOLN, Take 1 kit by mouth as directed., Disp: 2 Bottle, Rfl: 0 .  triamterene-hydrochlorothiazide (MAXZIDE-25) 37.5-25 MG tablet, Take 0.5-1 tablets by mouth daily., Disp: 90 tablet, Rfl: 1  Allergies  Allergen Reactions  . Pennsaid [Diclofenac Sodium] Rash  . Oxycodone     Hallucinations  . Shellfish Allergy     ROS  No other specific complaints in a complete review of systems (except as listed in HPI above).  Objective  Vitals:   03/24/18 1300  BP: 124/80  Pulse: 74  Resp: 18  Temp: 99.5 F (37.5 C)  TempSrc: Oral  SpO2: 92%  Weight: 221 lb (100.2 kg)  Height: 5' 2"  (1.575 m)   Body mass index is 40.42 kg/m.  Nursing Note and Vital Signs reviewed.  Physical Exam  Constitutional: Patient appears well-developed and well-nourished. No distress.  HEENT: head atraumatic, normocephalic, pupils equal and reactive to light, EOM's intact, neck supple without lymphadenopathy, oropharynx pink and moist without exudate Cardiovascular: Normal rate, regular rhythm, S1/S2 present.  No murmur  or rub heard. Scant bilateral nonpitting edema. Distal pulses intact. Pulmonary/Chest: Effort normal and breath sounds clear. No respiratory distress or retractions. Neurological: She is alert and oriented to person, place, and time. No cranial nerve deficit. Coordination, balance, strength, speech and gait are normal.  Skin: Skin is warm and dry. No rash noted. No erythema. Psychiatric: Patient has a normal mood and affect. behavior is normal. Judgment and thought content normal.   Assessment & Plan RTC in 1 month for F/U: cough, SOB, heartburn- starting  breo and protonix, diagnostic testing today, referral to cardiology Suspect that her symptoms are multifactorial in nature, which we discussed today.  SOB (shortness of breath), Cough Will order additional testing for evaluation of continued cough, sob  Start breo and PPI - dosing and side effects discussed, continue albuterol PRN -Red flags and when to present for emergency care or RTC including fever >101.57F, chest pain, shortness of breath, new/worsening/un-resolving symptoms, reviewed with patient at time of visit. Follow up and care instructions discussed and provided in AVS. RTC in 1 month for F/U - POCT EXHALED NITRIC OXIDE-7- which does not really support a diagnosis of asthma - fluticasone furoate-vilanterol (BREO ELLIPTA) 100-25 MCG/INH AEPB; Inhale 1 puff into the lungs daily.  Dispense: 28 each; Refill: 1 - Ambulatory referral to Cardiology - DG Chest 2 View; Future - pantoprazole (PROTONIX) 40 MG tablet; Take 1 tablet (40 mg total) by mouth daily.  Dispense: 30 tablet; Refill: 1 - EKG 12-Lead- I have personally reviewed the EKG tracing and agree with computerized printout as noted: sinus rhythm, no acute abnormality-comparison to prior EKG dated 09/25/16 - due to risk factors and symptoms today referral has been made to cardiology for further evaluation

## 2018-04-15 ENCOUNTER — Other Ambulatory Visit: Payer: Self-pay | Admitting: Nurse Practitioner

## 2018-04-15 DIAGNOSIS — R059 Cough, unspecified: Secondary | ICD-10-CM

## 2018-04-15 DIAGNOSIS — R05 Cough: Secondary | ICD-10-CM

## 2018-04-15 DIAGNOSIS — R0602 Shortness of breath: Secondary | ICD-10-CM

## 2018-04-15 DIAGNOSIS — K219 Gastro-esophageal reflux disease without esophagitis: Secondary | ICD-10-CM

## 2018-04-28 ENCOUNTER — Encounter: Payer: Self-pay | Admitting: Nurse Practitioner

## 2018-04-28 ENCOUNTER — Other Ambulatory Visit (INDEPENDENT_AMBULATORY_CARE_PROVIDER_SITE_OTHER): Payer: BC Managed Care – PPO

## 2018-04-28 ENCOUNTER — Ambulatory Visit (INDEPENDENT_AMBULATORY_CARE_PROVIDER_SITE_OTHER): Payer: BC Managed Care – PPO | Admitting: Nurse Practitioner

## 2018-04-28 VITALS — BP 126/86 | HR 76 | Temp 98.3°F | Resp 16 | Ht 62.0 in | Wt 226.0 lb

## 2018-04-28 DIAGNOSIS — R05 Cough: Secondary | ICD-10-CM

## 2018-04-28 DIAGNOSIS — I10 Essential (primary) hypertension: Secondary | ICD-10-CM

## 2018-04-28 DIAGNOSIS — R059 Cough, unspecified: Secondary | ICD-10-CM

## 2018-04-28 DIAGNOSIS — K219 Gastro-esophageal reflux disease without esophagitis: Secondary | ICD-10-CM

## 2018-04-28 DIAGNOSIS — R0602 Shortness of breath: Secondary | ICD-10-CM

## 2018-04-28 DIAGNOSIS — J45909 Unspecified asthma, uncomplicated: Secondary | ICD-10-CM

## 2018-04-28 LAB — COMPREHENSIVE METABOLIC PANEL
ALT: 19 U/L (ref 0–35)
AST: 16 U/L (ref 0–37)
Albumin: 4.1 g/dL (ref 3.5–5.2)
Alkaline Phosphatase: 74 U/L (ref 39–117)
BUN: 13 mg/dL (ref 6–23)
CHLORIDE: 105 meq/L (ref 96–112)
CO2: 30 meq/L (ref 19–32)
Calcium: 9.3 mg/dL (ref 8.4–10.5)
Creatinine, Ser: 0.86 mg/dL (ref 0.40–1.20)
GFR: 86.86 mL/min (ref >60.00)
GLUCOSE: 84 mg/dL (ref 70–99)
POTASSIUM: 4.1 meq/L (ref 3.5–5.1)
SODIUM: 142 meq/L (ref 135–145)
Total Bilirubin: 0.3 mg/dL (ref 0.2–1.2)
Total Protein: 7.1 g/dL (ref 6.0–8.3)

## 2018-04-28 LAB — CBC
HEMATOCRIT: 39.5 % (ref 36.0–46.0)
HEMOGLOBIN: 12.4 g/dL (ref 12.0–15.0)
MCHC: 31.3 g/dL (ref 30.0–36.0)
MCV: 66.9 fl — ABNORMAL LOW (ref 78.0–100.0)
PLATELETS: 209 10*3/uL (ref 150.0–400.0)
RBC: 5.9 Mil/uL — ABNORMAL HIGH (ref 3.87–5.11)
RDW: 17.4 % — AB (ref 11.5–15.5)
WBC: 7.5 10*3/uL (ref 4.0–10.5)

## 2018-04-28 LAB — TSH: TSH: 1.64 u[IU]/mL (ref 0.35–4.50)

## 2018-04-28 LAB — BRAIN NATRIURETIC PEPTIDE: PRO B NATRI PEPTIDE: 100 pg/mL (ref 0.0–100.0)

## 2018-04-28 NOTE — Patient Instructions (Addendum)
Please head downstairs for lab work. If any of your test results are critically abnormal, you will be contacted right away. Otherwise, I will contact you within a week about your test results and any recommendations for abnormalities.  Please call to schedule cardiology follow up as we discussed.  I will reach out with further plan when labs are back

## 2018-04-28 NOTE — Progress Notes (Signed)
Name: Molly Bean   MRN: [id number]    DOB: [birth year]   Date:04/28/2018       Progress Note  Subjective  Chief Complaint  Chief Complaint  Patient presents with  . Follow-up    HPI Ms Molly Bean is here today for follow up of cough, shortness of breath and heartburn, discussed at last OV on 03/24/18. We will also follow up on hypertension. At her last OV on 03/24/18, She was started on breo and protonix for her symptoms and a referral to cardiology was made for further evaluation of her symptoms . She tells me today that she has not yet scheduled cardiology appointment but has been called by the cardiology office to schedule, has just been too busy at work to make the appointment. She has been using the breo daily as prescribed and cough and nighttime awakenings have improved, but does continue to have intermittent dry cough as well as continuing to feel fatigued, short of breath with exertion, and does still continue to wake some nights feeling short of breath. She has not used her albuterol since starting the breo. She also continues to experience occassional headaches and mild LE edema. She has also been taking protonix daily as instructed and has not noticed any symptoms of heartburn or acid regurgitation. She is maintained on triamterene-HCTZ 37.5-25 daily for hypertension. Reports daily medication compliance without noted adverse medication effects. Does not report home readings today She denies fevers, syncope, congestion, sneezing, sore throat, chest pain, vision changes, nausea, vomiting.  BP Readings from Last 3 Encounters:  04/28/18 126/86  03/24/18 124/80  03/06/18 128/74     Patient Active Problem List   Diagnosis Date Noted  . Cough 03/24/2018  . SOB (shortness of breath) 03/24/2018  . Asthma 03/14/2018  . Hidradenitis suppurativa 01/18/2018  . Generalized anxiety disorder 10/11/2016  . Near syncope   . MENOPAUSAL SYNDROME 04/23/2009  . OBESITY 02/15/2008  .  DEPRESSION 04/12/2007  . Gastroesophageal reflux disease 04/12/2007  . UTI'S, CHRONIC 04/12/2007  . EDEMA 04/12/2007  . HEADACHE 04/12/2007    Past Surgical History:  Procedure Laterality Date  . ABDOMINAL HYSTERECTOMY     total fibroids  . CESAREAN SECTION      Family History  Problem Relation Age of Onset  . Coronary artery disease Mother   . Hypertension Mother   . Diabetes Mother   . Atrial fibrillation Mother   . Seizures Father   . Lupus Cousin   . Breast cancer Sister 27    Social History   Socioeconomic History  . Marital status: Married    Spouse name: Not on file  . Number of children: Not on file  . Years of education: Not on file  . Highest education level: Not on file  Occupational History  . Not on file  Social Needs  . Financial resource strain: Not on file  . Food insecurity:    Worry: Not on file    Inability: Not on file  . Transportation needs:    Medical: Not on file    Non-medical: Not on file  Tobacco Use  . Smoking status: Former Smoker    Packs/day: 0.30    Years: 20.00    Pack years: 6.00    Types: Cigarettes  . Smokeless tobacco: Former Systems developer    Quit date: 10/05/1987  Substance and Sexual Activity  . Alcohol use: No  . Drug use: No  . Sexual activity: Not on file  Lifestyle  . Physical activity:  Days per week: Not on file    Minutes per session: Not on file  . Stress: Not on file  Relationships  . Social connections:    Talks on phone: Not on file    Gets together: Not on file    Attends religious service: Not on file    Active member of club or organization: Not on file    Attends meetings of clubs or organizations: Not on file    Relationship status: Not on file  . Intimate partner violence:    Fear of current or ex partner: Not on file    Emotionally abused: Not on file    Physically abused: Not on file    Forced sexual activity: Not on file  Other Topics Concern  . Not on file  Social History Narrative  . Not on  file     Current Outpatient Medications:  .  acetaminophen (TYLENOL) 500 MG tablet, Take 1,000 mg by mouth every 6 (six) hours as needed for mild pain or headache., Disp: , Rfl:  .  albuterol (PROVENTIL HFA;VENTOLIN HFA) 108 (90 Base) MCG/ACT inhaler, Inhale 2 puffs into the lungs every 6 (six) hours as needed for wheezing or shortness of breath., Disp: 1 Inhaler, Rfl: 3 .  fluticasone furoate-vilanterol (BREO ELLIPTA) 100-25 MCG/INH AEPB, Inhale 1 puff into the lungs daily., Disp: 28 each, Rfl: 1 .  pantoprazole (PROTONIX) 40 MG tablet, TAKE 1 TABLET BY MOUTH EVERY DAY, Disp: 30 tablet, Rfl: 1 .  sertraline (ZOLOFT) 25 MG tablet, TAKE 1 TABLET BY MOUTH EVERY DAY, Disp: 90 tablet, Rfl: 0 .  SUPREP BOWEL PREP KIT 17.5-3.13-1.6 GM/177ML SOLN, Take 1 kit by mouth as directed., Disp: 2 Bottle, Rfl: 0 .  triamterene-hydrochlorothiazide (MAXZIDE-25) 37.5-25 MG tablet, Take 0.5-1 tablets by mouth daily., Disp: 90 tablet, Rfl: 1  Allergies  Allergen Reactions  . Pennsaid [Diclofenac Sodium] Rash  . Oxycodone     Hallucinations  . Shellfish Allergy      ROS See HPI  Objective  Vitals:   04/28/18 1345  BP: 126/86  Pulse: 76  Resp: 16  Temp: 98.3 F (36.8 C)  TempSrc: Oral  SpO2: 98%  Weight: 226 lb (102.5 kg)  Height: 5' 2"  (1.575 m)    Body mass index is 41.34 kg/m.  Physical Exam Vital signs reviewed. Constitutional: Patient appears well-developed and well-nourished. No distress.  HEENT: head atraumatic, normocephalic, pupils equal and reactive to light, EOM's intact, neck supple without lymphadenopathy, oropharynx pink and moist without exudate Cardiovascular: Normal rate, regular rhythm, S1/S2 present.  No murmur or rub heard. Scant bilateral nonpitting edema. Distal pulses intact. Pulmonary/Chest: Effort normal and breath sounds clear. No respiratory distress or retractions. Neurological: She is alert and oriented to person, place, and time. No cranial nerve deficit.  Coordination, balance, strength, speech and gait are normal.  Skin: Skin is warm and dry. No rash noted. No erythema. Psychiatric: Patient has a normal mood and affect. behavior is normal. Judgment and thought content normal.   Assessment & Plan F/U TBD pending test results  Cough, SOB (shortness of breath) Advised her to call cardiology to schedule appointment for further evaluation of symptoms, she says she has the number and plans to call  Will order additional labs today and follow up with further plan of care pending lab results - B Nat Peptide; Future

## 2018-05-01 DIAGNOSIS — I10 Essential (primary) hypertension: Secondary | ICD-10-CM | POA: Insufficient documentation

## 2018-05-01 NOTE — Assessment & Plan Note (Signed)
Stable Continue protonix Follow up for new, worsening symptoms

## 2018-05-01 NOTE — Assessment & Plan Note (Signed)
Stable Continue triamterene-HCTZ at current dosage Continue to monitor - CBC; Future - Comprehensive metabolic panel; Future - TSH; Future

## 2018-05-05 ENCOUNTER — Other Ambulatory Visit: Payer: Self-pay | Admitting: Nurse Practitioner

## 2018-05-05 DIAGNOSIS — R0602 Shortness of breath: Secondary | ICD-10-CM

## 2018-05-13 ENCOUNTER — Other Ambulatory Visit: Payer: Self-pay | Admitting: Nurse Practitioner

## 2018-05-13 DIAGNOSIS — R05 Cough: Secondary | ICD-10-CM

## 2018-05-13 DIAGNOSIS — K219 Gastro-esophageal reflux disease without esophagitis: Secondary | ICD-10-CM

## 2018-05-13 DIAGNOSIS — R0602 Shortness of breath: Secondary | ICD-10-CM

## 2018-05-13 DIAGNOSIS — R059 Cough, unspecified: Secondary | ICD-10-CM

## 2018-05-16 ENCOUNTER — Other Ambulatory Visit: Payer: Self-pay | Admitting: Nurse Practitioner

## 2018-05-16 DIAGNOSIS — R05 Cough: Secondary | ICD-10-CM

## 2018-05-16 DIAGNOSIS — R059 Cough, unspecified: Secondary | ICD-10-CM

## 2018-05-16 NOTE — Telephone Encounter (Signed)
Molly Bean, it looks like [name] wanted her to call cardiology about the shortness of breath symptoms. I will give one refill on the albuterol but she needs to make that appointment as discussed.

## 2018-05-19 NOTE — Telephone Encounter (Signed)
Spoke with pt and advised her to call cardiology to set up an appointment for SOB. Pt understood and was given the number to call to get appointment set up.

## 2018-05-24 ENCOUNTER — Ambulatory Visit (AMBULATORY_SURGERY_CENTER): Payer: BC Managed Care – PPO | Admitting: Internal Medicine

## 2018-05-24 ENCOUNTER — Encounter: Payer: Self-pay | Admitting: Internal Medicine

## 2018-05-24 VITALS — BP 115/60 | HR 61 | Temp 97.8°F | Resp 13 | Ht 62.0 in | Wt 226.0 lb

## 2018-05-24 DIAGNOSIS — K921 Melena: Secondary | ICD-10-CM

## 2018-05-24 DIAGNOSIS — D122 Benign neoplasm of ascending colon: Secondary | ICD-10-CM

## 2018-05-24 DIAGNOSIS — Z1211 Encounter for screening for malignant neoplasm of colon: Secondary | ICD-10-CM | POA: Diagnosis not present

## 2018-05-24 DIAGNOSIS — K625 Hemorrhage of anus and rectum: Secondary | ICD-10-CM

## 2018-05-24 DIAGNOSIS — K649 Unspecified hemorrhoids: Secondary | ICD-10-CM

## 2018-05-24 MED ORDER — SODIUM CHLORIDE 0.9 % IV SOLN: 500.0000 mL | Freq: Once | INTRAVENOUS | Status: DC

## 2018-05-24 NOTE — Op Note (Signed)
[facility] Patient Name: Marielouise Amey Procedure Date: 05/24/2018 8:55 AM MRN: [id number] Endoscopist: Docia Chuck. Henrene Pastor , MD Age: 57 Referring MD:  Date of Birth: October 24, 1960 Gender: Female Account #: [id number] Procedure:                Colonoscopy, with cold snare polypectomy x 1 Indications:              Screening for colorectal malignant neoplasm.                            Incidental minor rectal bleeding. Previous negative                            examination approximately 10 years ago Medicines:                Monitored Anesthesia Care Procedure:                Pre-Anesthesia Assessment:                           - Prior to the procedure, a History and Physical                            was performed, and patient medications and                            allergies were reviewed. The patient's tolerance of                            previous anesthesia was also reviewed. The risks                            and benefits of the procedure and the sedation                            options and risks were discussed with the patient.                            All questions were answered, and informed consent                            was obtained. Prior Anticoagulants: The patient has                            taken no previous anticoagulant or antiplatelet                            agents. ASA Grade Assessment: II - A patient with                            mild systemic disease. After reviewing the risks                            and benefits, the patient was deemed in  satisfactory condition to undergo the procedure.                           After obtaining informed consent, the colonoscope                            was passed under direct vision. Throughout the                            procedure, the patient's blood pressure, pulse, and                            oxygen saturations were monitored continuously. The            Colonoscope was introduced through the anus and                            advanced to the the cecum, identified by                            appendiceal orifice and ileocecal valve. The                            ileocecal valve, appendiceal orifice, and rectum                            were photographed. The quality of the bowel                            preparation was excellent. The colonoscopy was                            performed without difficulty. The patient tolerated                            the procedure well. The bowel preparation used was                            SUPREP. Scope In: 9:16:46 AM Scope Out: 9:30:36 AM Scope Withdrawal Time: 0 hours 9 minutes 47 seconds  Total Procedure Duration: 0 hours 13 minutes 50 seconds  Findings:                 A 4 mm polyp was found in the proximal ascending                            colon. The polyp was removed with a cold snare.                            Resection and retrieval were complete.                           Internal hemorrhoids were found during  retroflexion. The hemorrhoids were small.                           The exam was otherwise without abnormality on                            direct and retroflexion views. Complications:            No immediate complications. Estimated blood loss:                            None. Estimated Blood Loss:     Estimated blood loss: none. Impression:               - One 4 mm polyp in the proximal ascending colon,                            removed with a cold snare. Resected and retrieved.                           - Internal hemorrhoids.                           - The examination was otherwise normal on direct                            and retroflexion views. Recommendation:           - Repeat colonoscopy in 5 or 10 years for                            surveillance, based on final pathology.                           - Patient has a  contact number available for                            emergencies. The signs and symptoms of potential                            delayed complications were discussed with the                            patient. Return to normal activities tomorrow.                            Written discharge instructions were provided to the                            patient.                           - Resume previous diet.                           - Continue present medications.                           -  Await pathology results.                           - Increase MiraLAX DAILY to achieve desired result,                            as we discussed                           - Follow-up in the office with Dr. Ardis Hughs in                            approximately 4 weeks Docia Chuck. Henrene Pastor, MD 05/24/2018 9:38:17 AM This report has been signed electronically.

## 2018-05-24 NOTE — Patient Instructions (Signed)
HANDOUTS GIVEN FOR POLYPS AND HEMORRHOIDS  INCREASE MIRALAX TO ACHIEVE DESIRED RESULT  FOLLOW UP IN OFFICE WITH DR JACOBS IN 4 WEEKS  YOU HAD AN ENDOSCOPIC PROCEDURE TODAY AT [facility]:   Refer to the procedure report that was given to you for any specific questions about what was found during the examination.  If the procedure report does not answer your questions, please call your gastroenterologist to clarify.  If you requested that your care partner not be given the details of your procedure findings, then the procedure report has been included in a sealed envelope for you to review at your convenience later.  YOU SHOULD EXPECT: Some feelings of bloating in the abdomen. Passage of more gas than usual.  Walking can help get rid of the air that was put into your GI tract during the procedure and reduce the bloating. If you had a lower endoscopy (such as a colonoscopy or flexible sigmoidoscopy) you may notice spotting of blood in your stool or on the toilet paper. If you underwent a bowel prep for your procedure, you may not have a normal bowel movement for a few days.  Please Note:  You might notice some irritation and congestion in your nose or some drainage.  This is from the oxygen used during your procedure.  There is no need for concern and it should clear up in a day or so.  SYMPTOMS TO REPORT IMMEDIATELY:   Following lower endoscopy (colonoscopy or flexible sigmoidoscopy):  Excessive amounts of blood in the stool  Significant tenderness or worsening of abdominal pains  Swelling of the abdomen that is new, acute  Fever of 100F or higher   For urgent or emergent issues, a gastroenterologist can be reached at any hour by calling [phone].   DIET:  We do recommend a small meal at first, but then you may proceed to your regular diet.  Drink plenty of fluids but you should avoid alcoholic beverages for 24 hours.  ACTIVITY:  You should plan to take it easy for  the rest of today and you should NOT DRIVE or use heavy machinery until tomorrow (because of the sedation medicines used during the test).    FOLLOW UP: Our staff will call the number listed on your records the next business day following your procedure to check on you and address any questions or concerns that you may have regarding the information given to you following your procedure. If we do not reach you, we will leave a message.  However, if you are feeling well and you are not experiencing any problems, there is no need to return our call.  We will assume that you have returned to your regular daily activities without incident.  If any biopsies were taken you will be contacted by phone or by letter within the next 1-3 weeks.  Please call us at [phone] if you have not heard about the biopsies in 3 weeks.    SIGNATURES/CONFIDENTIALITY: You and/or your care partner have signed paperwork which will be entered into your electronic medical record.  These signatures attest to the fact that that the information above on your After Visit Summary has been reviewed and is understood.  Full responsibility of the confidentiality of this discharge information lies with you and/or your care-partner.

## 2018-05-24 NOTE — Progress Notes (Signed)
Called to room to assist during endoscopic procedure.  Patient ID and intended procedure confirmed with present staff. Received instructions for my participation in the procedure from the performing physician.  

## 2018-05-24 NOTE — Progress Notes (Signed)
A and O x3. Report to RN. Tolerated MAC anesthesia well.

## 2018-05-25 ENCOUNTER — Telehealth: Payer: Self-pay

## 2018-05-25 NOTE — Telephone Encounter (Signed)
  Follow up Call-  Call back number 05/24/2018  Post procedure Call Back phone  # [phone]  Permission to leave phone message Yes  Some recent data might be hidden     Patient questions:  Do you have a fever, pain , or abdominal swelling? Yes.   Pain Score  5 *  Have you tolerated food without any problems? Yes.    Have you been able to return to your normal activities? No.  Do you have any questions about your discharge instructions: Diet   No. Medications  No. Follow up visit  No.  Do you have questions or concerns about your Care? No.  Actions: * If pain score is 4 or above: No action needed, pain <4. Patient continue with abdominal cramping. Patient stated she is not going in to work today. Instructed to drink hot coffee or hot tea. Also given different positions to try and expelled the air. Walking around in the house also. Number given for her to call back if symptoms worsen.

## 2018-05-30 ENCOUNTER — Encounter: Payer: Self-pay | Admitting: Internal Medicine

## 2018-06-09 ENCOUNTER — Other Ambulatory Visit: Payer: Self-pay | Admitting: Nurse Practitioner

## 2018-06-09 DIAGNOSIS — R05 Cough: Secondary | ICD-10-CM

## 2018-06-09 DIAGNOSIS — K219 Gastro-esophageal reflux disease without esophagitis: Secondary | ICD-10-CM

## 2018-06-09 DIAGNOSIS — R0602 Shortness of breath: Secondary | ICD-10-CM

## 2018-06-09 DIAGNOSIS — R059 Cough, unspecified: Secondary | ICD-10-CM

## 2018-06-12 MED ORDER — PANTOPRAZOLE SODIUM 40 MG PO TBEC: 40.0000 mg | DELAYED_RELEASE_TABLET | Freq: Every day | ORAL | 0 refills | Status: DC

## 2018-06-12 NOTE — Addendum Note (Signed)
Addended by: Cresenciano Lick on: 06/12/2018 09:34 AM   Modules accepted: Orders

## 2018-06-24 ENCOUNTER — Other Ambulatory Visit: Payer: Self-pay | Admitting: Nurse Practitioner

## 2018-06-24 DIAGNOSIS — R059 Cough, unspecified: Secondary | ICD-10-CM

## 2018-06-24 DIAGNOSIS — R05 Cough: Secondary | ICD-10-CM

## 2018-06-26 ENCOUNTER — Encounter: Payer: Self-pay | Admitting: Nurse Practitioner

## 2018-06-26 ENCOUNTER — Ambulatory Visit (INDEPENDENT_AMBULATORY_CARE_PROVIDER_SITE_OTHER): Payer: BC Managed Care – PPO | Admitting: Nurse Practitioner

## 2018-06-26 VITALS — BP 148/84 | HR 57 | Temp 98.4°F | Ht 62.0 in | Wt 223.0 lb

## 2018-06-26 DIAGNOSIS — J069 Acute upper respiratory infection, unspecified: Secondary | ICD-10-CM

## 2018-06-26 DIAGNOSIS — J45901 Unspecified asthma with (acute) exacerbation: Secondary | ICD-10-CM

## 2018-06-26 MED ORDER — PREDNISONE 20 MG PO TABS: 40.0000 mg | ORAL_TABLET | Freq: Every day | ORAL | 0 refills | Status: DC

## 2018-06-26 MED ORDER — BENZONATATE 100 MG PO CAPS: 100.0000 mg | ORAL_CAPSULE | Freq: Two times a day (BID) | ORAL | 0 refills | Status: DC | PRN

## 2018-06-26 NOTE — Patient Instructions (Addendum)
Please call [facility] for an appointment [phone]   Prednisone 40 mg once daily for 5 days. Please use your albuterol inhaler 1-2 puffs every 6 hours for the next 2 days. Tessalon for cough- prescription sent. Saline nasal spray used frequently. For drainage may use Allegra, Claritin or Zyrtec. If you need stronger medicine to stop drainage may take Chlor-Trimeton 2-4 mg every 4 hours. This may cause drowsiness. Ibuprofen 600 mg every 6 hours as needed for pain, discomfort or fever. Drink plenty of fluids and stay well-hydrated.   Please follow up for fevers over 101, if your symptoms get worse, or if your symptoms are not getting better by Wednesday or Thursday.   Upper Respiratory Infection, Adult Most upper respiratory infections (URIs) are caused by a virus. A URI affects the nose, throat, and upper air passages. The most common type of URI is often called "the common cold." Follow these instructions at home:  Take medicines only as told by your doctor.  Gargle warm saltwater or take cough drops to comfort your throat as told by your doctor.  Use a warm mist humidifier or inhale steam from a shower to increase air moisture. This may make it easier to breathe.  Drink enough fluid to keep your pee (urine) clear or pale yellow.  Eat soups and other clear broths.  Have a healthy diet.  Rest as needed.  Go back to work when your fever is gone or your doctor says it is okay. ? You may need to stay home longer to avoid giving your URI to others. ? You can also wear a face mask and wash your hands often to prevent spread of the virus.  Use your inhaler more if you have asthma.  Do not use any tobacco products, including cigarettes, chewing tobacco, or electronic cigarettes. If you need help quitting, ask your doctor. Contact a doctor if:  You are getting worse, not better.  Your symptoms are not helped by medicine.  You have chills.  You are getting more  short of breath.  You have brown or red mucus.  You have yellow or brown discharge from your nose.  You have pain in your face, especially when you bend forward.  You have a fever.  You have puffy (swollen) neck glands.  You have pain while swallowing.  You have white areas in the back of your throat. Get help right away if:  You have very bad or constant: ? Headache. ? Ear pain. ? Pain in your forehead, behind your eyes, and over your cheekbones (sinus pain). ? Chest pain.  You have long-lasting (chronic) lung disease and any of the following: ? Wheezing. ? Long-lasting cough. ? Coughing up blood. ? A change in your usual mucus.  You have a stiff neck.  You have changes in your: ? Vision. ? Hearing. ? Thinking. ? Mood. This information is not intended to replace advice given to you by your health care provider. Make sure you discuss any questions you have with your health care provider. Document Released: 03/08/2008 Document Revised: 05/23/2016 Document Reviewed: 12/26/2013 Elsevier Interactive Patient Education  2018 [employer].

## 2018-06-26 NOTE — Progress Notes (Signed)
Name: Molly Bean   MRN: [id number]    DOB: 1961/09/30   Date:06/26/2018       Progress Note  Subjective  Chief Complaint  Chief Complaint  Patient presents with  . Cough    x 5 days, productive at times  . Chest Pain    left side ribs    HPI  Molly Bean is here today for evaluation of acute complaint of cough, cold symptoms, symptoms began this past Wednesday, began to feel really bad on Friday.  She reports fevers, malaise, headaches, wheezing, shortness of breath, productive cough with light yellow sputum, body aches. She denies syncope, abdominal pain, nausea, vomiting. Could not go to church yesterday she felt so bad Drinking tea, honey, lemon, tylenol, mucous relief liquid, albuterol inhaler with little improvement in her symptoms   Patient Active Problem List   Diagnosis Date Noted  . Essential hypertension 05/01/2018  . Cough 03/24/2018  . SOB (shortness of breath) 03/24/2018  . Asthma 03/14/2018  . Hidradenitis suppurativa 01/18/2018  . Generalized anxiety disorder 10/11/2016  . Near syncope   . MENOPAUSAL SYNDROME 04/23/2009  . OBESITY 02/15/2008  . DEPRESSION 04/12/2007  . Gastroesophageal reflux disease 04/12/2007  . UTI'S, CHRONIC 04/12/2007  . EDEMA 04/12/2007    Social History   Tobacco Use  . Smoking status: Former Smoker    Packs/day: 0.30    Years: 20.00    Pack years: 6.00    Types: Cigarettes  . Smokeless tobacco: Former Systems developer    Quit date: 10/05/1987  Substance Use Topics  . Alcohol use: No     Current Outpatient Medications:  .  acetaminophen (TYLENOL) 500 MG tablet, Take 1,000 mg by mouth every 6 (six) hours as needed for mild pain or headache., Disp: , Rfl:  .  albuterol (PROVENTIL HFA;VENTOLIN HFA) 108 (90 Base) MCG/ACT inhaler, TAKE 2 PUFFS BY MOUTH EVERY 6 HOURS AS NEEDED FOR WHEEZE OR SHORTNESS OF BREATH, Disp: 8.5 Inhaler, Rfl: 1 .  fluticasone furoate-vilanterol (BREO ELLIPTA) 100-25 MCG/INH AEPB, Inhale 1 puff into the lungs  daily., Disp: 28 each, Rfl: 1 .  pantoprazole (PROTONIX) 40 MG tablet, Take 1 tablet (40 mg total) by mouth daily., Disp: 90 tablet, Rfl: 0 .  sertraline (ZOLOFT) 25 MG tablet, TAKE 1 TABLET BY MOUTH EVERY DAY, Disp: 90 tablet, Rfl: 0 .  triamterene-hydrochlorothiazide (MAXZIDE-25) 37.5-25 MG tablet, Take 0.5-1 tablets by mouth daily., Disp: 90 tablet, Rfl: 1  Allergies  Allergen Reactions  . Pennsaid [Diclofenac Sodium] Rash  . Oxycodone     Hallucinations  . Shellfish Allergy     ROS  No other specific complaints in a complete review of systems (except as listed in HPI above).  Objective  Vitals:   06/26/18 1508  BP: (!) 148/84  Pulse: (!) 57  Temp: 98.4 F (36.9 C)  TempSrc: Oral  SpO2: 98%  Weight: 223 lb (101.2 kg)  Height: 5\' 2"  (1.575 m)    Body mass index is 40.79 kg/m.  Nursing Note and Vital Signs reviewed.  Physical Exam  Constitutional: Patient appears well-developed and well-nourished.  No distress.  HEENT: head atraumatic, normocephalic, pupils equal and reactive to light, EOM's intact, TM's without erythema or bulging, no maxillary or frontal sinus tenderness, neck supple without lymphadenopathy, oropharynx pink and moist without exudate Cardiovascular: Normal rate, regular rhythm, S1/S2 present. No murmur or rub heard.Scant bilateral nonpitting edema.Distal pulses intact. Pulmonary/Chest: Effort normal and breath sounds clear. No respiratory distress or retractions. Neurological:She is alert and oriented  to person, place, and time. No cranial nerve deficit. Coordination, balance, strength, speech and gait are normal.  Skin: Skin is warm and dry. No rash noted. No erythema. Psychiatric: Patient has a normal mood and affect. behavior is normal. Judgment and thought content normal.  Assessment & Plan  Upper respiratory tract infection, unspecified type, Exacerbation of asthma, unspecified asthma severity, unspecified whether persistent Symptoms likely  viral/allergy related today, will treat for URI and allergy exacerbation-med dosing and side effects discussed -home management, Red flags and when to present for emergency care or RTC including fever >101.56F, chest pain, shortness of breath, new/worsening/un-resolving symptoms,  reviewed with patient at time of visit. Follow up and care instructions discussed and provided in AVS. Of note, I have placed referrals to pulmonology and cardiology for her for evaluation of SOB, cough, DOE reviewed over past several visits, she has scheduled with pulmonology, has not heard from cardiology, I have provided their office number for her to call for appointment - benzonatate (TESSALON) 100 MG capsule; Take 1 capsule (100 mg total) by mouth 2 (two) times daily as needed for cough.  Dispense: 20 capsule; Refill: 0 - predniSONE (DELTASONE) 20 MG tablet; Take 2 tablets (40 mg total) by mouth daily with breakfast.  Dispense: 10 tablet; Refill: 0

## 2018-06-28 ENCOUNTER — Ambulatory Visit (INDEPENDENT_AMBULATORY_CARE_PROVIDER_SITE_OTHER): Payer: BC Managed Care – PPO | Admitting: Gastroenterology

## 2018-06-28 ENCOUNTER — Encounter: Payer: Self-pay | Admitting: Gastroenterology

## 2018-06-28 VITALS — BP 116/74 | HR 80 | Ht 62.25 in | Wt 222.2 lb

## 2018-06-28 DIAGNOSIS — K5900 Constipation, unspecified: Secondary | ICD-10-CM

## 2018-06-28 NOTE — Patient Instructions (Addendum)
Continue miralax twice a day. Please start taking citrucel (orange flavored) powder fiber supplement.  This may cause some bloating at first but that usually goes away. Begin with a small spoonful and work your way up to a large, heaping spoonful daily over a week. Call in 4 weeks to report on your response to the above regimen.  Work note for this appointment.  Thank you for entrusting me with your care and choosing [facility].  Dr Ardis Hughs

## 2018-06-28 NOTE — Progress Notes (Signed)
Review of pertinent gastrointestinal problems: 1. PH of adenomatous polyp in colon: Colonoscopy August 2010 for routine risk screening, Dr. Owens Loffler.  Findings melanosis, otherwise normal examination.  She was recommended to have repeat colonoscopy for colon cancer screening at 10-year interval. Colonoscopy Dr. Henrene Pastor 05/2018 for minor rectal bleeding (hb normal), found internal hemorrhoids, 48mm TA. Recommended recall at 5 years.   HPI: This is a very pleasant 57 year old woman whom I last saw the time of an office visit about 2 months ago.  She underwent her colonoscopy with 1 of my partners Dr. Henrene Pastor about 1 month ago, I was injured at the time and unable to do colonoscopy.  Since then she has been on MiraLAX twice a day.  1 dose twice daily.  She has noticed no improvement in her chronic constipation.  She will go once a week or sometimes not at all.  She often administers an enema to help her bowels move.  She has never tried fiber supplements.  Her bleeding has stopped.  Chief complaint is chronic constipation  ROS: complete GI ROS as described in HPI, all other review negative.  Constitutional:  No unintentional weight loss   Past Medical History:  Diagnosis Date  . Anxiety   . DEPRESSION   . Edema   . GERD   . MENOPAUSAL SYNDROME   . OBESITY   . UTI'S, CHRONIC     Past Surgical History:  Procedure Laterality Date  . ABDOMINAL HYSTERECTOMY     total fibroids  . CESAREAN SECTION      Current Outpatient Medications  Medication Sig Dispense Refill  . acetaminophen (TYLENOL) 500 MG tablet Take 1,000 mg by mouth every 6 (six) hours as needed for mild pain or headache.    . albuterol (PROVENTIL HFA;VENTOLIN HFA) 108 (90 Base) MCG/ACT inhaler TAKE 2 PUFFS BY MOUTH EVERY 6 HOURS AS NEEDED FOR WHEEZE OR SHORTNESS OF BREATH 8.5 Inhaler 1  . benzonatate (TESSALON) 100 MG capsule Take 1 capsule (100 mg total) by mouth 2 (two) times daily as needed for cough. 20 capsule 0  .  fluticasone furoate-vilanterol (BREO ELLIPTA) 100-25 MCG/INH AEPB Inhale 1 puff into the lungs daily. 28 each 1  . pantoprazole (PROTONIX) 40 MG tablet Take 1 tablet (40 mg total) by mouth daily. 90 tablet 0  . predniSONE (DELTASONE) 20 MG tablet Take 2 tablets (40 mg total) by mouth daily with breakfast. 10 tablet 0  . sertraline (ZOLOFT) 25 MG tablet TAKE 1 TABLET BY MOUTH EVERY DAY 90 tablet 0  . triamterene-hydrochlorothiazide (MAXZIDE-25) 37.5-25 MG tablet Take 0.5-1 tablets by mouth daily. 90 tablet 1   No current facility-administered medications for this visit.     Allergies as of 06/28/2018 - Review Complete 06/28/2018  Allergen Reaction Noted  . Pennsaid [diclofenac sodium] Rash 06/21/2016  . Oxycodone  03/09/2014  . Shellfish allergy  09/23/2014    Family History  Problem Relation Age of Onset  . Coronary artery disease Mother   . Hypertension Mother   . Diabetes Mother   . Atrial fibrillation Mother   . Seizures Father   . Lupus Cousin   . Breast cancer Sister 51    Social History   Socioeconomic History  . Marital status: Married    Spouse name: Not on file  . Number of children: Not on file  . Years of education: Not on file  . Highest education level: Not on file  Occupational History  . Not on file  Social Needs  .  Financial resource strain: Not on file  . Food insecurity:    Worry: Not on file    Inability: Not on file  . Transportation needs:    Medical: Not on file    Non-medical: Not on file  Tobacco Use  . Smoking status: Former Smoker    Packs/day: 0.30    Years: 20.00    Pack years: 6.00    Types: Cigarettes  . Smokeless tobacco: Former Systems developer    Quit date: 10/05/1987  Substance and Sexual Activity  . Alcohol use: No  . Drug use: No  . Sexual activity: Not on file  Lifestyle  . Physical activity:    Days per week: Not on file    Minutes per session: Not on file  . Stress: Not on file  Relationships  . Social connections:    Talks on  phone: Not on file    Gets together: Not on file    Attends religious service: Not on file    Active member of club or organization: Not on file    Attends meetings of clubs or organizations: Not on file    Relationship status: Not on file  . Intimate partner violence:    Fear of current or ex partner: Not on file    Emotionally abused: Not on file    Physically abused: Not on file    Forced sexual activity: Not on file  Other Topics Concern  . Not on file  Social History Narrative  . Not on file     Physical Exam: Ht 5' 2.25" (1.581 m) Comment: height measured without shoes  Wt 222 lb 4 oz (100.8 kg)   BMI 40.32 kg/m  Constitutional: generally well-appearing Psychiatric: alert and oriented x3 Abdomen: soft, nontender, nondistended, no obvious ascites, no peritoneal signs, normal bowel sounds No peripheral edema noted in lower extremities  Assessment and plan: 57 y.o. female with chronic constipation, personal history of adenomatous polyps  She will be due for repeat colonoscopy at 5-year interval because of her small subcentimeter adenoma removed about a month ago.  She is on twice daily single dose of MiraLAX without much improvement in her chronic constipation.  She will add daily fiber supplement to that regimen and then she will call in 4 weeks to report on her response.  If she is still very bothered by constipation then I will likely try her on Linzess.  Please see the "Patient Instructions" section for addition details about the plan.  Owens Loffler, MD [facility] 06/28/2018, 9:07 AM

## 2018-07-06 ENCOUNTER — Ambulatory Visit (INDEPENDENT_AMBULATORY_CARE_PROVIDER_SITE_OTHER): Payer: BC Managed Care – PPO | Admitting: Pulmonary Disease

## 2018-07-06 ENCOUNTER — Encounter: Payer: Self-pay | Admitting: Pulmonary Disease

## 2018-07-06 VITALS — BP 126/78 | HR 65 | Ht 62.0 in | Wt 220.0 lb

## 2018-07-06 DIAGNOSIS — G4733 Obstructive sleep apnea (adult) (pediatric): Secondary | ICD-10-CM

## 2018-07-06 DIAGNOSIS — R0683 Snoring: Secondary | ICD-10-CM | POA: Diagnosis not present

## 2018-07-06 NOTE — Progress Notes (Signed)
Molly Bean    [id number]    14-Nov-1960  Primary Care Physician:Shambley, Delphia Grates, NP  Referring Physician: Lance Sell, NP [address], [place] [id number]  Chief complaint:   Excessive daytime sleepiness, significant snoring  HPI:  Told by family members about snoring No witnessed apneas She has  gained about 5 to 10 pounds recently Usually goes to bed between 930 and 10:30 PM, falls asleep in 15 to 20 minutes Has been told on multiple occasions that she falls asleep faster than anyone  Wakes up with a dry mouth, frequent headaches, difficulty with concentration, memory issues  Never smoker   Outpatient Encounter Medications as of 07/06/2018  Medication Sig  . acetaminophen (TYLENOL) 500 MG tablet Take 1,000 mg by mouth every 6 (six) hours as needed for mild pain or headache.  . albuterol (PROVENTIL HFA;VENTOLIN HFA) 108 (90 Base) MCG/ACT inhaler TAKE 2 PUFFS BY MOUTH EVERY 6 HOURS AS NEEDED FOR WHEEZE OR SHORTNESS OF BREATH  . benzonatate (TESSALON) 100 MG capsule Take 1 capsule (100 mg total) by mouth 2 (two) times daily as needed for cough.  . fluticasone furoate-vilanterol (BREO ELLIPTA) 100-25 MCG/INH AEPB Inhale 1 puff into the lungs daily.  . pantoprazole (PROTONIX) 40 MG tablet Take 1 tablet (40 mg total) by mouth daily.  . predniSONE (DELTASONE) 20 MG tablet Take 2 tablets (40 mg total) by mouth daily with breakfast.  . sertraline (ZOLOFT) 25 MG tablet TAKE 1 TABLET BY MOUTH EVERY DAY  . triamterene-hydrochlorothiazide (MAXZIDE-25) 37.5-25 MG tablet Take 0.5-1 tablets by mouth daily.   No facility-administered encounter medications on file as of 07/06/2018.     Allergies as of 07/06/2018 - Review Complete 06/28/2018  Allergen Reaction Noted  . Pennsaid [diclofenac sodium] Rash 06/21/2016  . Oxycodone  03/09/2014  . Shellfish allergy  09/23/2014    Past Medical History:  Diagnosis Date  . Anxiety   .  DEPRESSION   . Edema   . GERD   . MENOPAUSAL SYNDROME   . OBESITY   . UTI'S, CHRONIC     Past Surgical History:  Procedure Laterality Date  . ABDOMINAL HYSTERECTOMY     total fibroids  . CESAREAN SECTION      Family History  Problem Relation Age of Onset  . Coronary artery disease Mother   . Hypertension Mother   . Diabetes Mother   . Atrial fibrillation Mother   . Seizures Father   . Lupus Cousin   . Breast cancer Sister 27    Social History   Socioeconomic History  . Marital status: Married    Spouse name: Not on file  . Number of children: Not on file  . Years of education: Not on file  . Highest education level: Not on file  Occupational History  . Not on file  Social Needs  . Financial resource strain: Not on file  . Food insecurity:    Worry: Not on file    Inability: Not on file  . Transportation needs:    Medical: Not on file    Non-medical: Not on file  Tobacco Use  . Smoking status: Former Smoker    Packs/day: 0.30    Years: 20.00    Pack years: 6.00    Types: Cigarettes  . Smokeless tobacco: Former Systems developer    Quit date: 10/05/1987  Substance and Sexual Activity  . Alcohol use: No  . Drug use: No  .  Sexual activity: Not on file  Lifestyle  . Physical activity:    Days per week: Not on file    Minutes per session: Not on file  . Stress: Not on file  Relationships  . Social connections:    Talks on phone: Not on file    Gets together: Not on file    Attends religious service: Not on file    Active member of club or organization: Not on file    Attends meetings of clubs or organizations: Not on file    Relationship status: Not on file  . Intimate partner violence:    Fear of current or ex partner: Not on file    Emotionally abused: Not on file    Physically abused: Not on file    Forced sexual activity: Not on file  Other Topics Concern  . Not on file  Social History Narrative  . Not on file    Review of Systems  Constitutional:  Negative.   HENT: Negative.   Eyes: Negative.   Respiratory: Negative.   Cardiovascular: Positive for leg swelling.  Endocrine: Negative.   Genitourinary: Negative.   Musculoskeletal: Negative.   Skin: Negative.   Psychiatric/Behavioral: Positive for sleep disturbance.    There were no vitals filed for this visit.   Physical Exam  Constitutional: She is oriented to person, place, and time. She appears well-developed and well-nourished.  HENT:  Head: Normocephalic and atraumatic.  Crowded oropharynx, Mallampati 4  Eyes: Pupils are equal, round, and reactive to light. Conjunctivae and EOM are normal.  Neck: Normal range of motion. Neck supple. No tracheal deviation present. No thyromegaly present.  Cardiovascular: Normal rate and regular rhythm. Exam reveals no friction rub.  No murmur heard. Pulmonary/Chest: Effort normal and breath sounds normal. No respiratory distress. She has no wheezes.  Abdominal: Soft. Bowel sounds are normal. She exhibits no distension. There is no tenderness.  Musculoskeletal: Normal range of motion. She exhibits no edema or deformity.  Neurological: She is alert and oriented to person, place, and time. She has normal reflexes. No cranial nerve deficit.  Skin: Skin is warm and dry. No rash noted. No erythema.  Psychiatric: She has a normal mood and affect.   Assessment:   High probability of significant obstructive sleep apnea  -Significant daytime sleepiness History of depression - Obesity -Recent weight gain  Plan/Recommendations:  Schedule a home sleep study  Pathophysiology of sleep disordered breathing discussed  Options of treatment for sleep disordered breathing discussed  I will see her back in the office in 2-3 months  Sherrilyn Rist MD [facility] 07/06/2018, 4:32 PM  CC: Lance Sell, NP

## 2018-07-06 NOTE — Patient Instructions (Signed)
High probability of significant sleep disordered breathing  Excessive daytime sleepiness  We will schedule a home sleep study  Possibility of treatment with a auto titrating CPAP  I will see you back in the office in about 2 to 3 months following initiation of treatment  Call with any concerns

## 2018-07-10 ENCOUNTER — Other Ambulatory Visit: Payer: Self-pay | Admitting: Pulmonary Disease

## 2018-07-10 DIAGNOSIS — G4719 Other hypersomnia: Secondary | ICD-10-CM

## 2018-07-10 DIAGNOSIS — Z87898 Personal history of other specified conditions: Secondary | ICD-10-CM

## 2018-07-13 ENCOUNTER — Other Ambulatory Visit: Payer: Self-pay | Admitting: Nurse Practitioner

## 2018-07-24 ENCOUNTER — Ambulatory Visit (INDEPENDENT_AMBULATORY_CARE_PROVIDER_SITE_OTHER): Payer: BC Managed Care – PPO | Admitting: Internal Medicine

## 2018-07-24 ENCOUNTER — Encounter: Payer: Self-pay | Admitting: Internal Medicine

## 2018-07-24 VITALS — BP 120/88 | HR 65 | Ht 62.0 in | Wt 224.0 lb

## 2018-07-24 DIAGNOSIS — F419 Anxiety disorder, unspecified: Secondary | ICD-10-CM | POA: Diagnosis not present

## 2018-07-24 DIAGNOSIS — R0683 Snoring: Secondary | ICD-10-CM | POA: Diagnosis not present

## 2018-07-24 DIAGNOSIS — R0602 Shortness of breath: Secondary | ICD-10-CM | POA: Diagnosis not present

## 2018-07-24 DIAGNOSIS — R600 Localized edema: Secondary | ICD-10-CM | POA: Diagnosis not present

## 2018-07-24 DIAGNOSIS — R0600 Dyspnea, unspecified: Secondary | ICD-10-CM

## 2018-07-24 NOTE — Patient Instructions (Signed)
Medication Instructions:  Your physician recommends that you continue on your current medications as directed. Please refer to the Current Medication list given to you today.  If you need a refill on your cardiac medications before your next appointment, please call your pharmacy.   Lab work: None ordered  Testing/Procedures: Your physician has requested that you have a stress echocardiogram. For further information please visit [email or web address]. Please follow instruction sheet as given.    Follow-Up: At [facility], you and your health needs are our priority.  As part of our continuing mission to provide you with exceptional heart care, we have created designated Provider Care Teams.  These Care Teams include your primary Cardiologist (physician) and Advanced Practice Providers (APPs -  Physician Assistants and Nurse Practitioners) who all work together to provide you with the care you need, when you need it. You will need a follow up appointment in 3 months with Dr.Acharya.   Any Other Special Instructions Will Be Listed Below (If Applicable). We will refer your for a healthy living consult. Lars Mage will contact you directly    Exercise Stress Echocardiogram An exercise stress echocardiogram is a test that checks how well your heart is working. For this test, you will walk on a treadmill to make your heart beat faster. This test uses sound waves (ultrasound) and a computer to make pictures (images) of your heart. These pictures will be taken before you exercise and after you exercise. What happens before the procedure?  Follow instructions from your doctor about what you cannot eat or drink before the test.  Do not drink or eat anything that has caffeine in it. Stop having caffeine for 24 hours before the test.  Ask your doctor about changing or stopping your normal medicines. This is important if you take diabetes medicines or blood thinners. Ask your doctor if you should take  your medicines with water before the test.  If you use an inhaler, bring it to the test.  Do not use any products that have nicotine or tobacco in them, such as cigarettes and e-cigarettes. Stop using them for 4 hours before the test. If you need help quitting, ask your doctor.  Wear comfortable shoes and clothing. What happens during the procedure?  You will be hooked up to a TV screen. Your doctor will watch the screen to see how fast your heart beats during the test.  Before you exercise, a computer will make a picture of your heart. To do this: ? A gel will be put on your chest. ? A wand will be moved over the gel. ? Sound waves from the wand will go to the computer to make the picture.  Your will start walking on a treadmill. The treadmill will start at a slow speed. It will get faster a little bit at a time. When you walk faster, your heart will beat faster.  The treadmill will be stopped when your heart is working hard.  You will lie down right away so another picture of your heart can be taken.  The test will take 30-60 minutes. What happens after the procedure?  Your heart rate and blood pressure will be watched after the test.  If your doctor says that you can, you may: ? Eat what you usually eat. ? Do your normal activities. ? Take medicines like normal. Summary  An exercise stress echocardiogram is a test that checks how well your heart is working.  Follow instructions about what you cannot eat or  drink before the test. Ask your doctor if you should take your normal medicines before the test.  Stop having caffeine for 24 hours before the test. Do not use anything with nicotine or tobacco in it for 4 hours before the test.  A computer will take a picture of your heart before you walk on a treadmill. It will take another picture when you are done walking.  Your heart rate and blood pressure will be watched after the test. This information is not intended to replace  advice given to you by your health care provider. Make sure you discuss any questions you have with your health care provider. Document Released: 07/18/2009 Document Revised: 06/13/2016 Document Reviewed: 06/13/2016 Elsevier Interactive Patient Education  2017 [employer].

## 2018-07-24 NOTE — Consult Note (Signed)
Cardiology Office Note:    Date:  07/24/2018   ID:  Molly Bean, DOB 1961/01/01, MRN [id number]  PCP:  Lance Sell, NP  Cardiologist:  No primary care provider on file.  Electrophysiologist:  None   Referring MD: Lance Sell, NP   Shortness of breath  History of Present Illness:    Molly Bean is a 57 y.o. female with a hx of anxiety, GERD who presents today for evaluation of shortness of breath at rest and with exertion.  She tells me that over the past 5 months she has had progressive symptoms of shortness of breath.  She primarily notes this while climbing 2 flights of stairs at her work repeatedly throughout the day.  After 8 steps she has to stop and catch her breath, and can proceed after that.  She also notes a sense of shortness of breath that occurs at rest, and can occur even when she is relaxing.  She notes a sense of having to gasp for air at times.  She denies chest discomfort, pressure, pain.  She notes palpitations but usually with anxiety and feels that that is a separate symptom from her shortness of breath.  She endorses paroxysmal nocturnal dyspnea, and foot swelling.  She is currently wearing stockings with some compression element.  She denies orthopnea.  She snores at night and is scheduled for a sleep study in November.  She describes fatigue on a daily basis and no energy.  She feels that her sleep is not restful.  She feels tired all the time.  She denies syncope but does endorse an episode of presyncope recently.  She describes this as sitting down and having both legs feel numb, and when she rose from a seated position her face felt drained of blood, she was quite pale, and she had to hold onto the wall to get back to bed.  She had a fitful night of sleep afterwards.  She does note significant life stressors over the past year which she feels have caused her to have a lot on her mind and have caused anxiety.  Past Medical History:    Diagnosis Date  . Anxiety   . DEPRESSION   . Edema   . GERD   . MENOPAUSAL SYNDROME   . OBESITY   . UTI'S, CHRONIC     Past Surgical History:  Procedure Laterality Date  . ABDOMINAL HYSTERECTOMY     total fibroids  . CESAREAN SECTION      Current Medications: Current Meds  Medication Sig  . acetaminophen (TYLENOL) 500 MG tablet Take 1,000 mg by mouth every 6 (six) hours as needed for mild pain or headache.  . albuterol (PROVENTIL HFA;VENTOLIN HFA) 108 (90 Base) MCG/ACT inhaler TAKE 2 PUFFS BY MOUTH EVERY 6 HOURS AS NEEDED FOR WHEEZE OR SHORTNESS OF BREATH  . benzonatate (TESSALON) 100 MG capsule Take 1 capsule (100 mg total) by mouth 2 (two) times daily as needed for cough.  . fluticasone furoate-vilanterol (BREO ELLIPTA) 100-25 MCG/INH AEPB Inhale 1 puff into the lungs daily.  . pantoprazole (PROTONIX) 40 MG tablet Take 1 tablet (40 mg total) by mouth daily.  . sertraline (ZOLOFT) 25 MG tablet TAKE 1 TABLET BY MOUTH EVERY DAY  . triamterene-hydrochlorothiazide (MAXZIDE-25) 37.5-25 MG tablet TAKE 0.5-1 TABLETS BY MOUTH DAILY.     Allergies:   Pennsaid [diclofenac sodium]; Oxycodone; and Shellfish allergy   Social History   Socioeconomic History  . Marital status: Married    Spouse name:  Not on file  . Number of children: Not on file  . Years of education: Not on file  . Highest education level: Not on file  Occupational History  . Not on file  Social Needs  . Financial resource strain: Not on file  . Food insecurity:    Worry: Not on file    Inability: Not on file  . Transportation needs:    Medical: Not on file    Non-medical: Not on file  Tobacco Use  . Smoking status: Former Smoker    Packs/day: 0.30    Years: 20.00    Pack years: 6.00    Types: Cigarettes  . Smokeless tobacco: Former Systems developer    Quit date: 10/05/1987  Substance and Sexual Activity  . Alcohol use: No  . Drug use: No  . Sexual activity: Not on file  Lifestyle  . Physical activity:    Days  per week: Not on file    Minutes per session: Not on file  . Stress: Not on file  Relationships  . Social connections:    Talks on phone: Not on file    Gets together: Not on file    Attends religious service: Not on file    Active member of club or organization: Not on file    Attends meetings of clubs or organizations: Not on file    Relationship status: Not on file  Other Topics Concern  . Not on file  Social History Narrative  . Not on file     Family History: The patient's family history includes Atrial fibrillation in her mother; Breast cancer (age of onset: 67) in her sister; Coronary artery disease in her mother; Diabetes in her mother; Hypertension in her mother; Lupus in her cousin; Seizures in her father.  ROS:   Please see the history of present illness.    All other systems reviewed and are negative.  EKGs/Labs/Other Studies Reviewed:    The following studies were reviewed today:  EKG:  EKG is ordered today.  The ekg ordered today demonstrates sinus rhythm with nonspecific T wave abnormality in leads III, V3, V4, V5 and V6.  Recent Labs: 04/28/2018: ALT 19; BUN 13; Creatinine, Ser 0.86; Hemoglobin 12.4; Platelets 209.0; Potassium 4.1; Pro B Natriuretic peptide (BNP) 100.0; Sodium 142; TSH 1.64  Recent Lipid Panel    Component Value Date/Time   CHOL 172 11/06/2010 1638   TRIG 104.0 11/06/2010 1638   HDL 53.70 11/06/2010 1638   CHOLHDL 3 11/06/2010 1638   VLDL 20.8 11/06/2010 1638   LDLCALC 98 11/06/2010 1638    Physical Exam:    VS:  BP 120/88 (BP Location: Left Arm, Patient Position: Sitting, Cuff Size: Large)   Pulse 65   Ht 5\' 2"  (1.575 m)   Wt 224 lb (101.6 kg)   BMI 40.97 kg/m     Wt Readings from Last 3 Encounters:  07/24/18 224 lb (101.6 kg)  07/06/18 220 lb (99.8 kg)  06/28/18 222 lb 4 oz (100.8 kg)     GEN: Well nourished, well developed in no acute distress HEENT: Normal NECK: No JVD; No carotid bruits LYMPHATICS: No  lymphadenopathy CARDIAC: RRR, no murmurs, rubs, gallops RESPIRATORY:  Clear to auscultation without rales, wheezing or rhonchi  ABDOMEN: Soft, non-tender, non-distended MUSCULOSKELETAL:  Trace bilateral pedal edema; No deformity  SKIN: Warm and dry NEUROLOGIC:  Alert and oriented x 3 PSYCHIATRIC:  Normal affect   ASSESSMENT:    1. SOB (shortness of breath)   2.  Snoring   3. Pedal edema   4. Anxiety   5. PND (paroxysmal nocturnal dyspnea)    PLAN:    In order of problems listed above:  Mrs. Schriner is presenting with at least 5 months of progressive shortness of breath, primarily with exertion.  She also notes gasping for air at rest at times, and snoring which suggests obstructive sleep apnea.  Her symptoms certainly warrant evaluation of structure and function of the heart with an echocardiogram, however given that her symptoms are exertional as well, and she has a history of increased weight as well as sleep apnea, and evaluation for ischemia is warranted.  We will perform a stress echocardiogram with treadmill exercise.  I am interested to know the patient's maximum exercise capacity, and would like for her to continue exercising past the point of 85% of her maximum heart rate.  She has a mildly abnormal ECG at baseline and her stress ECG may not be interpretable.  She is already scheduled for a sleep study in November and I concur with this assessment, I have emphasized to her today the importance of treating sleep apnea and the impact it can have on pulmonary hypertension as well as cardiovascular function.  She had a TSH performed in July which was 1.64 and is in the normal range, we will not repeat that today.  I look forward to seeing the patient back in 3 months time for reevaluation of symptoms and review of testing.   Medication Adjustments/Labs and Tests Ordered: Current medicines are reviewed at length with the patient today.  Concerns regarding medicines are outlined above.   Orders Placed This Encounter  Procedures  . EKG 12-Lead  . ECHOCARDIOGRAM STRESS TEST   No orders of the defined types were placed in this encounter.   Patient Instructions  Medication Instructions:  Your physician recommends that you continue on your current medications as directed. Please refer to the Current Medication list given to you today.  If you need a refill on your cardiac medications before your next appointment, please call your pharmacy.   Lab work: None ordered  Testing/Procedures: Your physician has requested that you have a stress echocardiogram. For further information please visit [email or web address]. Please follow instruction sheet as given.    Follow-Up: At [facility], you and your health needs are our priority.  As part of our continuing mission to provide you with exceptional heart care, we have created designated Provider Care Teams.  These Care Teams include your primary Cardiologist (physician) and Advanced Practice Providers (APPs -  Physician Assistants and Nurse Practitioners) who all work together to provide you with the care you need, when you need it. You will need a follow up appointment in 3 months with Dr.Lillyth Spong.   Any Other Special Instructions Will Be Listed Below (If Applicable). We will refer your for a healthy living consult. Lars Mage will contact you directly    Exercise Stress Echocardiogram An exercise stress echocardiogram is a test that checks how well your heart is working. For this test, you will walk on a treadmill to make your heart beat faster. This test uses sound waves (ultrasound) and a computer to make pictures (images) of your heart. These pictures will be taken before you exercise and after you exercise. What happens before the procedure?  Follow instructions from your doctor about what you cannot eat or drink before the test.  Do not drink or eat anything that has caffeine in it. Stop having caffeine for 24 hours  before the test.  Ask your doctor about changing or stopping your normal medicines. This is important if you take diabetes medicines or blood thinners. Ask your doctor if you should take your medicines with water before the test.  If you use an inhaler, bring it to the test.  Do not use any products that have nicotine or tobacco in them, such as cigarettes and e-cigarettes. Stop using them for 4 hours before the test. If you need help quitting, ask your doctor.  Wear comfortable shoes and clothing. What happens during the procedure?  You will be hooked up to a TV screen. Your doctor will watch the screen to see how fast your heart beats during the test.  Before you exercise, a computer will make a picture of your heart. To do this: ? A gel will be put on your chest. ? A wand will be moved over the gel. ? Sound waves from the wand will go to the computer to make the picture.  Your will start walking on a treadmill. The treadmill will start at a slow speed. It will get faster a little bit at a time. When you walk faster, your heart will beat faster.  The treadmill will be stopped when your heart is working hard.  You will lie down right away so another picture of your heart can be taken.  The test will take 30-60 minutes. What happens after the procedure?  Your heart rate and blood pressure will be watched after the test.  If your doctor says that you can, you may: ? Eat what you usually eat. ? Do your normal activities. ? Take medicines like normal. Summary  An exercise stress echocardiogram is a test that checks how well your heart is working.  Follow instructions about what you cannot eat or drink before the test. Ask your doctor if you should take your normal medicines before the test.  Stop having caffeine for 24 hours before the test. Do not use anything with nicotine or tobacco in it for 4 hours before the test.  A computer will take a picture of your heart before you  walk on a treadmill. It will take another picture when you are done walking.  Your heart rate and blood pressure will be watched after the test. This information is not intended to replace advice given to you by your health care provider. Make sure you discuss any questions you have with your health care provider. Document Released: 07/18/2009 Document Revised: 06/13/2016 Document Reviewed: 06/13/2016 Elsevier Interactive Patient Education  2017 [place], Elouise Munroe, MD  07/24/2018 2:45 PM    [facility]

## 2018-08-01 ENCOUNTER — Telehealth: Payer: Self-pay

## 2018-08-01 NOTE — Telephone Encounter (Signed)
Called to schedule initial visit. No answer. Will call back

## 2018-08-02 ENCOUNTER — Telehealth: Payer: Self-pay

## 2018-08-02 NOTE — Telephone Encounter (Signed)
New Message    Patient is returning call made to her today to schedule an appointment.

## 2018-08-04 ENCOUNTER — Telehealth (HOSPITAL_COMMUNITY): Payer: Self-pay | Admitting: *Deleted

## 2018-08-04 NOTE — Telephone Encounter (Signed)
Left message on voicemail per DPR in reference to upcoming appointment scheduled on 08/10/18 at 2:30 with detailed instructions given per Stress Test Requisition Sheet for the test. LM to arrive 30 minutes early, and that it is imperative to arrive on time for appointment to keep from having the test rescheduled. If you need to cancel or reschedule your appointment, please call the office within 24 hours of your appointment. Failure to do so may result in a cancellation of your appointment, and a $50 no show fee. Phone number given for call back for any questions. Veronia Beets

## 2018-08-10 ENCOUNTER — Ambulatory Visit (HOSPITAL_BASED_OUTPATIENT_CLINIC_OR_DEPARTMENT_OTHER): Payer: BC Managed Care – PPO

## 2018-08-10 ENCOUNTER — Ambulatory Visit (HOSPITAL_COMMUNITY): Payer: BC Managed Care – PPO | Attending: Cardiology

## 2018-08-10 DIAGNOSIS — R0602 Shortness of breath: Secondary | ICD-10-CM | POA: Diagnosis present

## 2018-08-11 ENCOUNTER — Telehealth: Payer: Self-pay

## 2018-08-11 ENCOUNTER — Ambulatory Visit (HOSPITAL_BASED_OUTPATIENT_CLINIC_OR_DEPARTMENT_OTHER): Payer: BC Managed Care – PPO | Attending: Pulmonary Disease

## 2018-08-11 NOTE — Telephone Encounter (Signed)
Pt aware of stress echo results and Dr.Acharya's recommendations with verbal understanding.

## 2018-08-11 NOTE — Telephone Encounter (Signed)
-----   Message from Elouise Munroe, MD sent at 08/11/2018  9:11 AM EST ----- Normal stress echo. I will see her at the 3 month follow up to determine if further therapy is needed for elevated blood pressure with exercise. Please encourage her to continue her sleep evaluation.  Exercise recommendations: Goal of exercising for at least 30 minutes a day, at least 5 times per week.  Please exercise to a moderate exertion.  This means that while exercising it is difficult to speak in full sentences, however you are not so short of breath that you feel you must stop, and not so comfortable that you can carry on a full conversation.  Exertion level should be approximately a 5/10, if 10 is the most exertion you can perform.  Diet recommendations: Recommend a heart healthy diet such as the Mediterranean diet.  This diet consists of healthy fats, lean meats, olive oil.  It suggests limiting the intake of simple carbohydrates such as white breads, pastries, and pastas.  It also limits the amount of red meat, wine, and dairy products such as cheese that one should consume on a daily basis.

## 2018-08-15 ENCOUNTER — Ambulatory Visit: Payer: BC Managed Care – PPO

## 2018-08-17 NOTE — Progress Notes (Unsigned)
Week: 1  Progress Notes: Pt discussed her decrease in energy levels. Is having a hard time completing steps without having to take a break. Is not feeling sleepy, just fatigued. Is also wanting to lose weight. Pt currently eats 2x a day-breakfast and dinner. Typically cooks dinner every night. Eats fried and processed food often throughout the week but is trying to incorporate more fruits and vegetables.  Challenges: Pt is currently drinking 1-2 bottles of water sporadically a day, but doesn't drink much else. Typically eats a breakfast everyday that her husband cooks of waffles and sausage.  Opportunities: Pt has treadmill in the home that she has expressed interest in using.   Client Commitment/Agreement for Next Session: First goal is set to aim for 5 mins of physical activity. Will also commit to at least 2 bottles of water a day. Will also look into new fruits to try. Next session is 11/26 at 4:30

## 2018-08-29 ENCOUNTER — Ambulatory Visit: Payer: BC Managed Care – PPO

## 2018-10-18 ENCOUNTER — Other Ambulatory Visit: Payer: Self-pay | Admitting: Nurse Practitioner

## 2018-10-18 ENCOUNTER — Telehealth: Payer: Self-pay | Admitting: *Deleted

## 2018-10-18 ENCOUNTER — Ambulatory Visit: Payer: Self-pay

## 2018-10-18 DIAGNOSIS — F411 Generalized anxiety disorder: Secondary | ICD-10-CM

## 2018-10-18 NOTE — Telephone Encounter (Signed)
Pt called stating that she has right thumb pain. She states it is 8-9.  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  She states she has not injured the thumb. The pain has become noticeable within the last two weeks.  There is no redness swelling.  She has already scheduled appointment for her day off 10/27/2018. Pt is requesting home care advice. Care advice read to patient. Pt verbalized understanding of all instructions. Pt will call if swelling rash, redness occur in the joint .   Reason for Disposition . Caused by overuse from recent vigorous activity (e.g., sports, repetitive motions, heavy lifting)  Answer Assessment - Initial Assessment Questions 1. ONSET: "When did the pain start?"     Recent flare in last 2 weeks 2. LOCATION: "Where is the pain located?"     Rt thumb 3. PAIN: "How bad is the pain?" (Scale 1-10; or mild, moderate, severe)   - MILD (1-3): doesn't interfere with normal activities   - MODERATE (4-7): interferes with normal activities (e.g., work or school) or awakens from sleep   - SEVERE (8-10): excruciating pain, unable to use hand at all     8-9 4. WORK OR EXERCISE: "Has there been any recent work or exercise that involved this part of the body?"     Typing on job 5. CAUSE: "What do you think is causing the pain?"     Job related overues 6. AGGRAVATING FACTORS: "What makes the pain worse?" (e.g., using computer)     computer 7. OTHER SYMPTOMS: "Do you have any other symptoms?" (e.g., neck pain, swelling, rash, numbness, fever)     No 8. PREGNANCY: "Is there any chance you are pregnant?" "When was your last menstrual period?"     N/A  Protocols used: HAND AND WRIST PAIN-A-AH

## 2018-10-18 NOTE — Telephone Encounter (Signed)
Copied from [place] [id number]. Topic: Quick Communication - Rx Refill/Question >> Oct 18, 2018  4:31 PM Wynetta Emery, [place] C wrote: Medication: Sertraline 25MG  and also Meloxicam 15 MG - pt is scheduled for ov on 10/27/18   Has the patient contacted their pharmacy? Yes.  (Agent: If no, request that the patient contact the pharmacy for the refill.) (Agent: If yes, when and what did the pharmacy advise?)  Preferred Pharmacy (with phone number or street name): [id number] [address], [place] [phone] (Phone) [phone] (Fax)    Agent: Please be advised that RX refills may take up to 3 business days. We ask that you follow-up with your pharmacy.

## 2018-10-18 NOTE — Telephone Encounter (Signed)
Patient was seen by Dr. Ron Agee on 07/06/18. Sleep study was ordered by him on 07/10/18.     Copied from [place] [id number]. Topic: General - Other >> Oct 18, 2018  4:29 PM Molly Bean wrote: Reason for CRM: pt says that she was suppose to have a sleep study completed last year. Pt would like to check the status and have further assistance with scheduling as she was told by their office that PCP's office would assist with scheduling. Pt says that she has not received a call.

## 2018-10-18 NOTE — Telephone Encounter (Signed)
Requested medication (s) are due for refill today: Yes  Requested medication (s) are on the active medication list: Yes  Last refill:01/30/18  Future visit scheduled: Yes  Notes to clinic:  Unable to refill per protocol, failed item     Requested Prescriptions  Pending Prescriptions Disp Refills   sertraline (ZOLOFT) 25 MG tablet 90 tablet 0    Sig: Take 1 tablet (25 mg total) by mouth daily.     Psychiatry:  Antidepressants - SSRI Failed - 10/18/2018  4:42 PM      Failed - Completed PHQ-2 or PHQ-9 in the last 360 days.      Passed - Valid encounter within last 6 months    Recent Outpatient Visits          3 months ago Upper respiratory tract infection, unspecified type   [place], Delphia Grates, NP   5 months ago SOB (shortness of breath)   [place], Delphia Grates, NP   6 months ago SOB (shortness of breath)   [place], Delphia Grates, NP   9 months ago Cough   [place], Delphia Grates, NP   1 year ago Generalized anxiety disorder   [place], Delphia Grates, NP      Future Appointments            In 1 week [place], Delphia Grates, NP [place], [place]

## 2018-10-19 NOTE — Telephone Encounter (Signed)
Pls advise if ok to refill../lmb 

## 2018-10-23 MED ORDER — SERTRALINE HCL 25 MG PO TABS: 25.0000 mg | ORAL_TABLET | Freq: Every day | ORAL | 0 refills | Status: DC

## 2018-10-27 ENCOUNTER — Other Ambulatory Visit (INDEPENDENT_AMBULATORY_CARE_PROVIDER_SITE_OTHER): Payer: BC Managed Care – PPO

## 2018-10-27 ENCOUNTER — Ambulatory Visit (INDEPENDENT_AMBULATORY_CARE_PROVIDER_SITE_OTHER)
Admission: RE | Admit: 2018-10-27 | Discharge: 2018-10-27 | Disposition: A | Discharge status: Home / Self Care | Payer: BC Managed Care – PPO | Source: Ambulatory Visit | Attending: Nurse Practitioner | Admitting: Nurse Practitioner

## 2018-10-27 ENCOUNTER — Ambulatory Visit (INDEPENDENT_AMBULATORY_CARE_PROVIDER_SITE_OTHER): Payer: BC Managed Care – PPO | Admitting: Nurse Practitioner

## 2018-10-27 ENCOUNTER — Telehealth: Payer: Self-pay | Admitting: Pulmonary Disease

## 2018-10-27 ENCOUNTER — Encounter: Payer: Self-pay | Admitting: Nurse Practitioner

## 2018-10-27 VITALS — BP 118/60 | HR 71 | Ht 62.0 in | Wt 230.0 lb

## 2018-10-27 DIAGNOSIS — R059 Cough, unspecified: Secondary | ICD-10-CM

## 2018-10-27 DIAGNOSIS — M79644 Pain in right finger(s): Secondary | ICD-10-CM

## 2018-10-27 DIAGNOSIS — M25519 Pain in unspecified shoulder: Secondary | ICD-10-CM

## 2018-10-27 DIAGNOSIS — L853 Xerosis cutis: Secondary | ICD-10-CM

## 2018-10-27 DIAGNOSIS — R7989 Other specified abnormal findings of blood chemistry: Secondary | ICD-10-CM

## 2018-10-27 DIAGNOSIS — I10 Essential (primary) hypertension: Secondary | ICD-10-CM

## 2018-10-27 DIAGNOSIS — K219 Gastro-esophageal reflux disease without esophagitis: Secondary | ICD-10-CM

## 2018-10-27 DIAGNOSIS — G8929 Other chronic pain: Secondary | ICD-10-CM

## 2018-10-27 DIAGNOSIS — R05 Cough: Secondary | ICD-10-CM

## 2018-10-27 LAB — CBC
HEMATOCRIT: 39.6 % (ref 36.0–46.0)
Hemoglobin: 12.6 g/dL (ref 12.0–15.0)
MCHC: 31.7 g/dL (ref 30.0–36.0)
MCV: 67 fl — ABNORMAL LOW (ref 78.0–100.0)
Platelets: 210 10*3/uL (ref 150.0–400.0)
RBC: 5.93 Mil/uL — ABNORMAL HIGH (ref 3.87–5.11)
RDW: 17.2 % — ABNORMAL HIGH (ref 11.5–15.5)
WBC: 6.6 10*3/uL (ref 4.0–10.5)

## 2018-10-27 LAB — IRON: Iron: 58 ug/dL (ref 42–145)

## 2018-10-27 MED ORDER — MELOXICAM 15 MG PO TABS: 15.0000 mg | ORAL_TABLET | Freq: Every day | ORAL | 0 refills | Status: DC

## 2018-10-27 MED ORDER — TRIAMTERENE-HCTZ 37.5-25 MG PO TABS: 0.5000 | ORAL_TABLET | Freq: Every day | ORAL | 1 refills | Status: DC

## 2018-10-27 MED ORDER — PANTOPRAZOLE SODIUM 40 MG PO TBEC: 40.0000 mg | DELAYED_RELEASE_TABLET | Freq: Every day | ORAL | 0 refills | Status: DC

## 2018-10-27 NOTE — Assessment & Plan Note (Signed)
We discussed the long term risks of mobic including CV, GI risks, she would like to continue I am going to have her follow up with sports medicine to consider further testing/treatment - meloxicam (MOBIC) 15 MG tablet; Take 1 tablet (15 mg total) by mouth daily.  Dispense: 30 tablet; Refill: 0

## 2018-10-27 NOTE — Patient Instructions (Signed)
Please head downstairs for lab work  Please continue planned follow ups with cardiology and pulmonology  I have placed referral to foot doctor and gastroenterologist for you  Please schedule a follow up appointment for further evaluation here with Dr Tamala Julian or Dr Raeford Razor, our sports medicine providers, for your shoulder and thumb pain

## 2018-10-27 NOTE — Assessment & Plan Note (Signed)
Stable Continue current medications - triamterene-hydrochlorothiazide (MAXZIDE-25) 37.5-25 MG tablet; Take 0.5-1 tablets by mouth daily.  Dispense: 90 tablet; Refill: 1

## 2018-10-27 NOTE — Assessment & Plan Note (Signed)
Continue protonix Referral to GI - pantoprazole (PROTONIX) 40 MG tablet; Take 1 tablet (40 mg total) by mouth daily.  Dispense: 90 tablet; Refill: 0 - Ambulatory referral to Gastroenterology

## 2018-10-27 NOTE — Assessment & Plan Note (Signed)
Continues... This could be related to uncontrolled GERD, will have her see GI for further evaluation Continue with planned pulmonology follow up as well - pantoprazole (PROTONIX) 40 MG tablet; Take 1 tablet (40 mg total) by mouth daily.  Dispense: 90 tablet; Refill: 0 - Ambulatory referral to Gastroenterology

## 2018-10-27 NOTE — Telephone Encounter (Signed)
I called sleep lab & spoke to Alger.  She doesn't know why pt was told to call us.  She is going to call the pt & reschedule her appt.  Nothing further needed.

## 2018-10-27 NOTE — Progress Notes (Signed)
Molly Bean is a 58 y.o. female with the following history as recorded in EpicCare:  Patient Active Problem List   Diagnosis Date Noted  . Essential hypertension 05/01/2018  . Cough 03/24/2018  . SOB (shortness of breath) 03/24/2018  . Asthma 03/14/2018  . Hidradenitis suppurativa 01/18/2018  . Generalized anxiety disorder 10/11/2016  . MENOPAUSAL SYNDROME 04/23/2009  . OBESITY 02/15/2008  . DEPRESSION 04/12/2007  . Gastroesophageal reflux disease 04/12/2007  . UTI'S, CHRONIC 04/12/2007  . EDEMA 04/12/2007    Current Outpatient Medications  Medication Sig Dispense Refill  . acetaminophen (TYLENOL) 500 MG tablet Take 1,000 mg by mouth every 6 (six) hours as needed for mild pain or headache.    . albuterol (PROVENTIL HFA;VENTOLIN HFA) 108 (90 Base) MCG/ACT inhaler TAKE 2 PUFFS BY MOUTH EVERY 6 HOURS AS NEEDED FOR WHEEZE OR SHORTNESS OF BREATH 8.5 Inhaler 1  . benzonatate (TESSALON) 100 MG capsule Take 1 capsule (100 mg total) by mouth 2 (two) times daily as needed for cough. 20 capsule 0  . fluticasone furoate-vilanterol (BREO ELLIPTA) 100-25 MCG/INH AEPB Inhale 1 puff into the lungs daily. 28 each 1  . pantoprazole (PROTONIX) 40 MG tablet Take 1 tablet (40 mg total) by mouth daily. 90 tablet 0  . sertraline (ZOLOFT) 25 MG tablet Take 1 tablet (25 mg total) by mouth daily. 90 tablet 0  . triamterene-hydrochlorothiazide (MAXZIDE-25) 37.5-25 MG tablet Take 0.5-1 tablets by mouth daily. 90 tablet 1  . meloxicam (MOBIC) 15 MG tablet Take 1 tablet (15 mg total) by mouth daily. 30 tablet 0   No current facility-administered medications for this visit.     Allergies: Pennsaid [diclofenac sodium]; Oxycodone; and Shellfish allergy  Past Medical History:  Diagnosis Date  . Anxiety   . DEPRESSION   . Edema   . GERD   . MENOPAUSAL SYNDROME   . OBESITY   . UTI'S, CHRONIC     Past Surgical History:  Procedure Laterality Date  . ABDOMINAL HYSTERECTOMY     total fibroids  . CESAREAN  SECTION      Family History  Problem Relation Age of Onset  . Coronary artery disease Mother   . Hypertension Mother   . Diabetes Mother   . Atrial fibrillation Mother   . Seizures Father   . Lupus Cousin   . Breast cancer Sister 14    Social History   Tobacco Use  . Smoking status: Former Smoker    Packs/day: 0.30    Years: 20.00    Pack years: 6.00    Types: Cigarettes  . Smokeless tobacco: Former Systems developer    Quit date: 10/05/1987  Substance Use Topics  . Alcohol use: No     Subjective:  Ms Inglis is here today for follow up of cough, sob, heartburn, I last saw her on 04/28/18 for these issues, referrals were pending/done for cardiology/pulmonology She did see cardiology on 10/21, stress test was done, normal, seeing cardiology again for follow up next week. Then saw pulmonology on 10/3, sleep study was ordered but she has not scheduled yet. She has several complaints today: tells me today that she has continued to notice dry cough, at night, requesting refill of tessalon today. She has continued protonix daily, has not noticed heartburn/GERD symptoms while taking protonix. She  c/o right thumb pain x 2 weeks, does not recall any injury, types at work all day, trying tylenol/ice/biocreme with some temporary relief but the pain is not getting better, "hurts just to pick something up",  no swelling/tingling/skin discoloration. She c/o "Dry callused skin" to bottom of right foot, for years, has tried multiple OTC agents with no relief. She is requesting refills of mobic, maxzide today. She says she has been on once daily mobic for some time now, for arthritis in her shoulder, taking daily without reported adverse effects. She is maintained on traim-HCTZ 37.5-25 for HTN, reports daily medication compliance without adverse medication effects. Denies fevers, weakness, syncope, headaches, vision changes, chest pain, shortness of breath, edema, abdominal pain, nausea,vomiting. Will recheck CBC/iron  today for follow up of abnormal CBC on 04/28/18  BP Readings from Last 3 Encounters:  10/27/18 118/60  07/24/18 120/88  07/06/18 126/78    ROS- See HPI   Objective:  Vitals:   10/27/18 0853  BP: 118/60  Pulse: 71  SpO2: 96%  Weight: 230 lb (104.3 kg)  Height: 5\' 2"  (1.575 m)    General: Well developed, well nourished, in no acute distress  Skin : Warm and dry. No rash, erythema. Dry callused skin to plantar surface of right foot. Head: Normocephalic and atraumatic  Eyes: Sclera and conjunctiva clear; pupils round and reactive to light; extraocular movements intact  Oropharynx: Pink, supple. No suspicious lesions  Neck: Supple Lungs: Respirations unlabored; clear to auscultation bilaterally without wheeze, rales, rhonchi  CVS exam: normal rate, regular rhythm, normal S1, S2, no murmurs, rubs, clicks or gallops.  Musculoskeletal: No deformities; no active joint inflammation, normal range of motion  Extremities: No edema, cyanosis, clubbing  Vessels: Symmetric bilaterally  Neurologic: Alert and oriented; speech intact; face symmetrical; moves all extremities well; CNII-XII intact without focal deficit  Psychiatric: Normal mood and affect.  Assessment:  1. Essential hypertension   2. Upper respiratory tract infection, unspecified type   3. Exacerbation of asthma, unspecified asthma severity, unspecified whether persistent   4. SOB (shortness of breath)   5. Cough   6. Gastroesophageal reflux disease, esophagitis presence not specified   7. Chronic shoulder pain, unspecified laterality   8. Dry skin   9. Pain of right thumb   10. Abnormal CBC     Plan:   Dry skin Right foot Discussed referral to podiatry for further evaluation, she is agreeable - Ambulatory referral to Podiatry  Pain of right thumb Check imaging Instructed to schedule appointment with sports medicine provider in our clinic for further evaluation F/U with further recommendations pending xray  results - DG Finger Thumb Right; Future  Abbnormal CBC Recheck labs F/U with further recommendations pending lab results - CBC; Future - Iron; Future   Return in about 6 months (around 04/27/2019) for CPE.  Orders Placed This Encounter  Procedures  . DG Finger Thumb Right    Standing Status:   Future    Standing Expiration Date:   12/26/2019    Order Specific Question:   Reason for Exam (SYMPTOM  OR DIAGNOSIS REQUIRED)    Answer:   thumb pain    Order Specific Question:   Is patient pregnant?    Answer:   No    Order Specific Question:   Preferred imaging location?    Answer:   [facility]    Order Specific Question:   Radiology Contrast Protocol - do NOT remove file path    Answer:   [email or web address]  . CBC    Standing Status:   Future    Standing Expiration Date:   10/28/2019  . Iron    Standing Status:   Future    Standing Expiration Date:  10/27/2019  . Ambulatory referral to Podiatry    Referral Priority:   Routine    Referral Type:   Consultation    Referral Reason:   Specialty Services Required    Requested Specialty:   Podiatry    Number of Visits Requested:   1  . Ambulatory referral to Gastroenterology    Referral Priority:   Routine    Referral Type:   Consultation    Referral Reason:   Specialty Services Required    Number of Visits Requested:   1    Requested Prescriptions   Signed Prescriptions Disp Refills  . meloxicam (MOBIC) 15 MG tablet 30 tablet 0    Sig: Take 1 tablet (15 mg total) by mouth daily.  Marland Kitchen triamterene-hydrochlorothiazide (MAXZIDE-25) 37.5-25 MG tablet 90 tablet 1    Sig: Take 0.5-1 tablets by mouth daily.  . pantoprazole (PROTONIX) 40 MG tablet 90 tablet 0    Sig: Take 1 tablet (40 mg total) by mouth daily.

## 2018-10-31 ENCOUNTER — Encounter: Payer: Self-pay | Admitting: Internal Medicine

## 2018-10-31 ENCOUNTER — Telehealth: Payer: Self-pay | Admitting: *Deleted

## 2018-10-31 ENCOUNTER — Ambulatory Visit (INDEPENDENT_AMBULATORY_CARE_PROVIDER_SITE_OTHER): Payer: BC Managed Care – PPO | Admitting: Internal Medicine

## 2018-10-31 VITALS — BP 120/72 | HR 68 | Ht 62.0 in | Wt 234.6 lb

## 2018-10-31 DIAGNOSIS — R002 Palpitations: Secondary | ICD-10-CM | POA: Diagnosis not present

## 2018-10-31 DIAGNOSIS — R0602 Shortness of breath: Secondary | ICD-10-CM

## 2018-10-31 DIAGNOSIS — R059 Cough, unspecified: Secondary | ICD-10-CM

## 2018-10-31 DIAGNOSIS — R05 Cough: Secondary | ICD-10-CM

## 2018-10-31 DIAGNOSIS — I10 Essential (primary) hypertension: Secondary | ICD-10-CM

## 2018-10-31 DIAGNOSIS — R0683 Snoring: Secondary | ICD-10-CM

## 2018-10-31 DIAGNOSIS — F419 Anxiety disorder, unspecified: Secondary | ICD-10-CM

## 2018-10-31 MED ORDER — FLUTICASONE FUROATE-VILANTEROL 100-25 MCG/INH IN AEPB: 1.0000 | INHALATION_SPRAY | Freq: Every day | RESPIRATORY_TRACT | 1 refills | Status: DC

## 2018-10-31 MED ORDER — AMLODIPINE BESYLATE 5 MG PO TABS: 5.0000 mg | ORAL_TABLET | Freq: Every day | ORAL | 3 refills | Status: DC

## 2018-10-31 NOTE — Patient Instructions (Signed)
Medication Instructions:  Your physician has recommended you make the following change in your medication:   START Amlodipine 5mg  daily. An Rx has been sent to your pharmacy  If you need a refill on your cardiac medications before your next appointment, please call your pharmacy.   Lab work: None ordered If you have labs (blood work) drawn today and your tests are completely normal, you will receive your results only by: Marland Kitchen MyChart Message (if you have MyChart) OR . A paper copy in the mail If you have any lab test that is abnormal or we need to change your treatment, we will call you to review the results.  Testing/Procedures: Your physician has recommended that you wear a holter monitor. Holter monitors are medical devices that record the heart's electrical activity. Doctors most often use these monitors to diagnose arrhythmias. Arrhythmias are problems with the speed or rhythm of the heartbeat. The monitor is a small, portable device. You can wear one while you do your normal daily activities. This is usually used to diagnose what is causing palpitations/syncope (passing out).    Follow-Up: At [facility], you and your health needs are our priority.  As part of our continuing mission to provide you with exceptional heart care, we have created designated Provider Care Teams.  These Care Teams include your primary Cardiologist (physician) and Advanced Practice Providers (APPs -  Physician Assistants and Nurse Practitioners) who all work together to provide you with the care you need, when you need it. You will need a follow up appointment in 3 months.    You may see  Dr.Acharya or one of the following Advanced Practice Providers on your designated Care Team:   Rosaria Ferries, PA-C . Jory Sims, DNP, ANP  Any Other Special Instructions Will Be Listed Below (If Applicable).  Mediterranean Diet A Mediterranean diet refers to food and lifestyle choices that are based on the  traditions of countries located on the [employer]. This way of eating has been shown to help prevent certain conditions and improve outcomes for people who have chronic diseases, like kidney disease and heart disease. What are tips for following this plan? Lifestyle  Cook and eat meals together with your family, when possible.  Drink enough fluid to keep your urine clear or pale yellow.  Be physically active every day. This includes: ? Aerobic exercise like running or swimming. ? Leisure activities like gardening, walking, or housework.  Get 7-8 hours of sleep each night.  If recommended by your health care provider, drink red wine in moderation. This means 1 glass a day for nonpregnant women and 2 glasses a day for men. A glass of wine equals 5 oz (150 mL). Reading food labels   Check the serving size of packaged foods. For foods such as rice and pasta, the serving size refers to the amount of cooked product, not dry.  Check the total fat in packaged foods. Avoid foods that have saturated fat or trans fats.  Check the ingredients list for added sugars, such as corn syrup. Shopping  At the grocery store, buy most of your food from the areas near the walls of the store. This includes: ? Fresh fruits and vegetables (produce). ? Grains, beans, nuts, and seeds. Some of these may be available in unpackaged forms or large amounts (in bulk). ? Fresh seafood. ? Poultry and eggs. ? Low-fat dairy products.  Buy whole ingredients instead of prepackaged foods.  Buy fresh fruits and vegetables in-season from local  farmers markets.  Buy frozen fruits and vegetables in resealable bags.  If you do not have access to quality fresh seafood, buy precooked frozen shrimp or canned fish, such as tuna, salmon, or sardines.  Buy small amounts of raw or cooked vegetables, salads, or olives from the deli or salad bar at your store.  Stock your pantry so you always have certain foods on hand,  such as olive oil, canned tuna, canned tomatoes, rice, pasta, and beans. Cooking  Cook foods with extra-virgin olive oil instead of using butter or other vegetable oils.  Have meat as a side dish, and have vegetables or grains as your main dish. This means having meat in small portions or adding small amounts of meat to foods like pasta or stew.  Use beans or vegetables instead of meat in common dishes like chili or lasagna.  Experiment with different cooking methods. Try roasting or broiling vegetables instead of steaming or sauteing them.  Add frozen vegetables to soups, stews, pasta, or rice.  Add nuts or seeds for added healthy fat at each meal. You can add these to yogurt, salads, or vegetable dishes.  Marinate fish or vegetables using olive oil, lemon juice, garlic, and fresh herbs. Meal planning   Plan to eat 1 vegetarian meal one day each week. Try to work up to 2 vegetarian meals, if possible.  Eat seafood 2 or more times a week.  Have healthy snacks readily available, such as: ? Vegetable sticks with hummus. ? [place] yogurt. ? Fruit and nut trail mix.  Eat balanced meals throughout the week. This includes: ? Fruit: 2-3 servings a day ? Vegetables: 4-5 servings a day ? Low-fat dairy: 2 servings a day ? Fish, poultry, or lean meat: 1 serving a day ? Beans and legumes: 2 or more servings a week ? Nuts and seeds: 1-2 servings a day ? Whole grains: 6-8 servings a day ? Extra-virgin olive oil: 3-4 servings a day  Limit red meat and sweets to only a few servings a month What are my food choices?  Mediterranean diet ? Recommended ? Grains: Whole-grain pasta. Brown rice. Bulgar wheat. Polenta. Couscous. Whole-wheat bread. [place]. ? Vegetables: Artichokes. Beets. Broccoli. Cabbage. Carrots. Eggplant. Green beans. Chard. Kale. Spinach. Onions. Leeks. Peas. Squash. Tomatoes. Peppers. Radishes. ? Fruits: Apples. Apricots. Avocado. Berries. Bananas. Cherries. Dates.  Figs. Grapes. Lemons. Melon. Oranges. Peaches. Plums. Pomegranate. ? Meats and other protein foods: Beans. Almonds. Sunflower seeds. Pine nuts. Peanuts. [place]. Salmon. Scallops. Shrimp. [place]. Tilapia. Clams. Oysters. Eggs. ? Dairy: Low-fat milk. Cheese. Greek yogurt. ? Beverages: Water. Red wine. Herbal tea. ? Fats and oils: Extra virgin olive oil. Avocado oil. Grape seed oil. ? Sweets and desserts: [place] yogurt with honey. Baked apples. Poached pears. Trail mix. ? Seasoning and other foods: Basil. Cilantro. Coriander. Cumin. Mint. Parsley. Sage. Rosemary. Tarragon. Garlic. Oregano. Thyme. Pepper. Balsalmic vinegar. Tahini. Hummus. Tomato sauce. Olives. Mushrooms. ? Limit these ? Grains: Prepackaged pasta or rice dishes. Prepackaged cereal with added sugar. ? Vegetables: Deep fried potatoes (french fries). ? Fruits: Fruit canned in syrup. ? Meats and other protein foods: Beef. Pork. Lamb. Poultry with skin. Hot dogs. Bernie[place] Salines. ? Dairy: Ice cream. Sour cream. Whole milk. ? Beverages: Juice. Sugar-sweetened soft drinks. Beer. Liquor and spirits. ? Fats and oils: Butter. Canola oil. Vegetable oil. Beef fat (tallow). Lard. ? Sweets and desserts: Cookies. Cakes. Pies. Candy. ? Seasoning and other foods: Mayonnaise. Premade sauces and marinades. ? The items listed may not be a  complete list. Talk with your dietitian about what dietary choices are right for you. Summary  The Mediterranean diet includes both food and lifestyle choices.  Eat a variety of fresh fruits and vegetables, beans, nuts, seeds, and whole grains.  Limit the amount of red meat and sweets that you eat.  Talk with your health care provider about whether it is safe for you to drink red wine in moderation. This means 1 glass a day for nonpregnant women and 2 glasses a day for men. A glass of wine equals 5 oz (150 mL). This information is not intended to replace advice given to you by your health care provider. Make sure you discuss  any questions you have with your health care provider. Document Released: 05/13/2016 Document Revised: 06/15/2016 Document Reviewed: 05/13/2016 Elsevier Interactive Patient Education  2019 [employer].

## 2018-10-31 NOTE — Telephone Encounter (Signed)
Pt stated that she does use the Breo Inhaler and needs it. Please advise.

## 2018-10-31 NOTE — Progress Notes (Signed)
Cardiology Office Note:    Date:  10/31/2018   ID:  Molly Bean, DOB Feb 21, 1961, MRN [id number]  PCP:  Lance Sell, NP  Cardiologist:  No primary care provider on file.  Electrophysiologist:  None   Referring MD: Lance Sell, NP    f/u shortness of breath   History of Present Illness:    Molly Bean is a 58 y.o. female with a hx of anxiety and GERD, with significant life stressors who presents for 3 mo f/u of shortness of breath.   She continues to have significant life stressors, but has noted slightly less shortness of breath. She continues to have palpitations frequently.   We completed a stress echo which was normal but demonstrated elevated BP with exercise.   The patient denies chest pain, chest pressure, dyspnea at rest, PND, orthopnea, or leg swelling. Denies syncope or presyncope. Denies dizziness or lightheadedness. Endorses snoring and has not been evaluated for sleep apnea.   Past Medical History:  Diagnosis Date  . Anxiety   . DEPRESSION   . Edema   . GERD   . MENOPAUSAL SYNDROME   . OBESITY   . UTI'S, CHRONIC     Past Surgical History:  Procedure Laterality Date  . ABDOMINAL HYSTERECTOMY     total fibroids  . CESAREAN SECTION      Current Medications: Current Meds  Medication Sig  . acetaminophen (TYLENOL) 500 MG tablet Take 1,000 mg by mouth every 6 (six) hours as needed for mild pain or headache.  . pantoprazole (PROTONIX) 40 MG tablet Take 1 tablet (40 mg total) by mouth daily.  . sertraline (ZOLOFT) 25 MG tablet Take 1 tablet (25 mg total) by mouth daily.  Marland Kitchen triamterene-hydrochlorothiazide (MAXZIDE-25) 37.5-25 MG tablet Take 0.5-1 tablets by mouth daily.  . [DISCONTINUED] albuterol (PROVENTIL HFA;VENTOLIN HFA) 108 (90 Base) MCG/ACT inhaler TAKE 2 PUFFS BY MOUTH EVERY 6 HOURS AS NEEDED FOR WHEEZE OR SHORTNESS OF BREATH  . [DISCONTINUED] fluticasone furoate-vilanterol (BREO ELLIPTA) 100-25 MCG/INH AEPB Inhale 1 puff into the  lungs daily.  . [DISCONTINUED] meloxicam (MOBIC) 15 MG tablet Take 1 tablet (15 mg total) by mouth daily.     Allergies:   Pennsaid [diclofenac sodium]; Oxycodone; and Shellfish allergy   Social History   Socioeconomic History  . Marital status: Married    Spouse name: Not on file  . Number of children: Not on file  . Years of education: Not on file  . Highest education level: Not on file  Occupational History  . Not on file  Social Needs  . Financial resource strain: Not on file  . Food insecurity:    Worry: Not on file    Inability: Not on file  . Transportation needs:    Medical: Not on file    Non-medical: Not on file  Tobacco Use  . Smoking status: Former Smoker    Packs/day: 0.30    Years: 20.00    Pack years: 6.00    Types: Cigarettes  . Smokeless tobacco: Former Systems developer    Quit date: 10/05/1987  Substance and Sexual Activity  . Alcohol use: No  . Drug use: No  . Sexual activity: Not on file  Lifestyle  . Physical activity:    Days per week: Not on file    Minutes per session: Not on file  . Stress: Not on file  Relationships  . Social connections:    Talks on phone: Not on file    Gets together: Not on  file    Attends religious service: Not on file    Active member of club or organization: Not on file    Attends meetings of clubs or organizations: Not on file    Relationship status: Not on file  Other Topics Concern  . Not on file  Social History Narrative  . Not on file     Family History: The patient's family history includes Atrial fibrillation in her mother; Breast cancer (age of onset: 45) in her sister; Coronary artery disease in her mother; Diabetes in her mother; Hypertension in her mother; Lupus in her cousin; Seizures in her father.  ROS:   Please see the history of present illness.    All other systems reviewed and are negative.  EKGs/Labs/Other Studies Reviewed:    The following studies were reviewed today:  EKG:  Not performed  today  Recent Labs: 04/28/2018: ALT 19; BUN 13; Creatinine, Ser 0.86; Potassium 4.1; Pro B Natriuretic peptide (BNP) 100.0; Sodium 142; TSH 1.64 10/27/2018: Hemoglobin 12.6; Platelets 210.0  Recent Lipid Panel    Component Value Date/Time   CHOL 172 11/06/2010 1638   TRIG 104.0 11/06/2010 1638   HDL 53.70 11/06/2010 1638   CHOLHDL 3 11/06/2010 1638   VLDL 20.8 11/06/2010 1638   LDLCALC 98 11/06/2010 1638    Physical Exam:    VS:  BP 120/72   Pulse 68   Ht 5\' 2"  (1.575 m)   Wt 234 lb 9.6 oz (106.4 kg)   BMI 42.91 kg/m     Wt Readings from Last 3 Encounters:  11/24/18 232 lb (105.2 kg)  11/15/18 231 lb (104.8 kg)  11/03/18 230 lb (104.3 kg)     Constitutional: No acute distress Eyes: pupils equally round and reactive to light, sclera non-icteric, normal conjunctiva and lids ENMT: normal dentition, moist mucous membranes Cardiovascular: regular rhythm, normal rate, no murmurs. S1 and S2 normal. Radial pulses normal bilaterally. No jugular venous distention.  Respiratory: clear to auscultation bilaterally GI : normal bowel sounds, soft and nontender. No distention.   MSK: extremities warm, well perfused. No edema.  NEURO: grossly nonfocal exam, moves all extremities. PSYCH: alert and oriented x 3, normal mood and affect.      ASSESSMENT:    1. Essential hypertension   2. Palpitations   3. SOB (shortness of breath)   4. Snoring   5. Anxiety    PLAN:    Palpitations - we will plan for a 48 hour holter monitor to evaluate nature of palpitations.   HTN  - We will start amlodipine 5 mg to address hypertension and elevated BP with exercise.   Address sleep study at next visit when patient has better sleep quality.   Medication Adjustments/Labs and Tests Ordered: Current medicines are reviewed at length with the patient today.  Concerns regarding medicines are outlined above.  Orders Placed This Encounter  Procedures  . Holter monitor - 48 hour   Meds ordered this  encounter  Medications  . amLODipine (NORVASC) 5 MG tablet    Sig: Take 1 tablet (5 mg total) by mouth daily.    Dispense:  90 tablet    Refill:  3    Patient Instructions  Medication Instructions:  Your physician has recommended you make the following change in your medication:   START Amlodipine 5mg  daily. An Rx has been sent to your pharmacy  If you need a refill on your cardiac medications before your next appointment, please call your pharmacy.   Lab  work: None ordered If you have labs (blood work) drawn today and your tests are completely normal, you will receive your results only by: Marland Kitchen MyChart Message (if you have MyChart) OR . A paper copy in the mail If you have any lab test that is abnormal or we need to change your treatment, we will call you to review the results.  Testing/Procedures: Your physician has recommended that you wear a holter monitor. Holter monitors are medical devices that record the heart's electrical activity. Doctors most often use these monitors to diagnose arrhythmias. Arrhythmias are problems with the speed or rhythm of the heartbeat. The monitor is a small, portable device. You can wear one while you do your normal daily activities. This is usually used to diagnose what is causing palpitations/syncope (passing out).    Follow-Up: At [facility], you and your health needs are our priority.  As part of our continuing mission to provide you with exceptional heart care, we have created designated Provider Care Teams.  These Care Teams include your primary Cardiologist (physician) and Advanced Practice Providers (APPs -  Physician Assistants and Nurse Practitioners) who all work together to provide you with the care you need, when you need it. You will need a follow up appointment in 3 months.    You may see  Dr.Eligah Anello or one of the following Advanced Practice Providers on your designated Care Team:   Rosaria Ferries, PA-C . Jory Sims, DNP,  ANP  Any Other Special Instructions Will Be Listed Below (If Applicable).  Mediterranean Diet A Mediterranean diet refers to food and lifestyle choices that are based on the traditions of countries located on the [employer]. This way of eating has been shown to help prevent certain conditions and improve outcomes for people who have chronic diseases, like kidney disease and heart disease. What are tips for following this plan? Lifestyle  Cook and eat meals together with your family, when possible.  Drink enough fluid to keep your urine clear or pale yellow.  Be physically active every day. This includes: ? Aerobic exercise like running or swimming. ? Leisure activities like gardening, walking, or housework.  Get 7-8 hours of sleep each night.  If recommended by your health care provider, drink red wine in moderation. This means 1 glass a day for nonpregnant women and 2 glasses a day for men. A glass of wine equals 5 oz (150 mL). Reading food labels   Check the serving size of packaged foods. For foods such as rice and pasta, the serving size refers to the amount of cooked product, not dry.  Check the total fat in packaged foods. Avoid foods that have saturated fat or trans fats.  Check the ingredients list for added sugars, such as corn syrup. Shopping  At the grocery store, buy most of your food from the areas near the walls of the store. This includes: ? Fresh fruits and vegetables (produce). ? Grains, beans, nuts, and seeds. Some of these may be available in unpackaged forms or large amounts (in bulk). ? Fresh seafood. ? Poultry and eggs. ? Low-fat dairy products.  Buy whole ingredients instead of prepackaged foods.  Buy fresh fruits and vegetables in-season from local farmers markets.  Buy frozen fruits and vegetables in resealable bags.  If you do not have access to quality fresh seafood, buy precooked frozen shrimp or canned fish, such as tuna, salmon, or  sardines.  Buy small amounts of raw or cooked vegetables, salads, or olives from the  deli or salad bar at your store.  Stock your pantry so you always have certain foods on hand, such as olive oil, canned tuna, canned tomatoes, rice, pasta, and beans. Cooking  Cook foods with extra-virgin olive oil instead of using butter or other vegetable oils.  Have meat as a side dish, and have vegetables or grains as your main dish. This means having meat in small portions or adding small amounts of meat to foods like pasta or stew.  Use beans or vegetables instead of meat in common dishes like chili or lasagna.  Experiment with different cooking methods. Try roasting or broiling vegetables instead of steaming or sauteing them.  Add frozen vegetables to soups, stews, pasta, or rice.  Add nuts or seeds for added healthy fat at each meal. You can add these to yogurt, salads, or vegetable dishes.  Marinate fish or vegetables using olive oil, lemon juice, garlic, and fresh herbs. Meal planning   Plan to eat 1 vegetarian meal one day each week. Try to work up to 2 vegetarian meals, if possible.  Eat seafood 2 or more times a week.  Have healthy snacks readily available, such as: ? Vegetable sticks with hummus. ? [place] yogurt. ? Fruit and nut trail mix.  Eat balanced meals throughout the week. This includes: ? Fruit: 2-3 servings a day ? Vegetables: 4-5 servings a day ? Low-fat dairy: 2 servings a day ? Fish, poultry, or lean meat: 1 serving a day ? Beans and legumes: 2 or more servings a week ? Nuts and seeds: 1-2 servings a day ? Whole grains: 6-8 servings a day ? Extra-virgin olive oil: 3-4 servings a day  Limit red meat and sweets to only a few servings a month What are my food choices?  Mediterranean diet ? Recommended ? Grains: Whole-grain pasta. Brown rice. Bulgar wheat. Polenta. Couscous. Whole-wheat bread. [place]. ? Vegetables: Artichokes. Beets. Broccoli. Cabbage.  Carrots. Eggplant. Green beans. Chard. Kale. Spinach. Onions. Leeks. Peas. Squash. Tomatoes. Peppers. Radishes. ? Fruits: Apples. Apricots. Avocado. Berries. Bananas. Cherries. Dates. Figs. Grapes. Lemons. Melon. Oranges. Peaches. Plums. Pomegranate. ? Meats and other protein foods: Beans. Almonds. Sunflower seeds. Pine nuts. Peanuts. [place]. Salmon. Scallops. Shrimp. [place]. Tilapia. Clams. Oysters. Eggs. ? Dairy: Low-fat milk. Cheese. Greek yogurt. ? Beverages: Water. Red wine. Herbal tea. ? Fats and oils: Extra virgin olive oil. Avocado oil. Grape seed oil. ? Sweets and desserts: [place] yogurt with honey. Baked apples. Poached pears. Trail mix. ? Seasoning and other foods: Basil. Cilantro. Coriander. Cumin. Mint. Parsley. Sage. Rosemary. Tarragon. Garlic. Oregano. Thyme. Pepper. Balsalmic vinegar. Tahini. Hummus. Tomato sauce. Olives. Mushrooms. ? Limit these ? Grains: Prepackaged pasta or rice dishes. Prepackaged cereal with added sugar. ? Vegetables: Deep fried potatoes (french fries). ? Fruits: Fruit canned in syrup. ? Meats and other protein foods: Beef. Pork. Lamb. Poultry with skin. Hot dogs. Berniece Salines. ? Dairy: Ice cream. Sour cream. Whole milk. ? Beverages: Juice. Sugar-sweetened soft drinks. Beer. Liquor and spirits. ? Fats and oils: Butter. Canola oil. Vegetable oil. Beef fat (tallow). Lard. ? Sweets and desserts: Cookies. Cakes. Pies. Candy. ? Seasoning and other foods: Mayonnaise. Premade sauces and marinades. ? The items listed may not be a complete list. Talk with your dietitian about what dietary choices are right for you. Summary  The Mediterranean diet includes both food and lifestyle choices.  Eat a variety of fresh fruits and vegetables, beans, nuts, seeds, and whole grains.  Limit the amount of red meat and sweets that  you eat.  Talk with your health care provider about whether it is safe for you to drink red wine in moderation. This means 1 glass a day for nonpregnant women  and 2 glasses a day for men. A glass of wine equals 5 oz (150 mL). This information is not intended to replace advice given to you by your health care provider. Make sure you discuss any questions you have with your health care provider. Document Released: 05/13/2016 Document Revised: 06/15/2016 Document Reviewed: 05/13/2016 Elsevier Interactive Patient Education  2019 [place], Elouise Munroe, MD  10/31/2018 4:13 PM    [facility]

## 2018-10-31 NOTE — Telephone Encounter (Signed)
Rx sent. See meds.  

## 2018-10-31 NOTE — Telephone Encounter (Signed)
Received a fax from [facility] requesting new Rx for Breo Ellipta 100-25 mch inhaler. It looks like it has been a while since it was filled. Left mess for patient to call back to clarify if patient still uses this. CRM created.

## 2018-11-03 ENCOUNTER — Ambulatory Visit (INDEPENDENT_AMBULATORY_CARE_PROVIDER_SITE_OTHER): Payer: BC Managed Care – PPO | Admitting: Family Medicine

## 2018-11-03 ENCOUNTER — Encounter: Payer: Self-pay | Admitting: Family Medicine

## 2018-11-03 ENCOUNTER — Ambulatory Visit: Payer: Self-pay

## 2018-11-03 ENCOUNTER — Ambulatory Visit: Payer: BC Managed Care – PPO | Admitting: Family Medicine

## 2018-11-03 VITALS — BP 122/74 | HR 64 | Ht 62.0 in | Wt 230.0 lb

## 2018-11-03 DIAGNOSIS — M79644 Pain in right finger(s): Secondary | ICD-10-CM

## 2018-11-03 MED ORDER — PREDNISONE 5 MG PO TABS: ORAL_TABLET | ORAL | 0 refills | Status: DC

## 2018-11-03 NOTE — Patient Instructions (Signed)
Nice to meet you  Please wear the brace while at work and night  Please use ice  Please see me back in 2-3 weeks

## 2018-11-03 NOTE — Progress Notes (Signed)
Molly Bean - 58 y.o. female MRN [id number]  Date of birth: 13-Aug-1961  SUBJECTIVE:  Including CC & ROS.  Chief Complaint  Patient presents with  . Hand Pain    R thumb x3 weeks    Molly Bean is a 58 y.o. female that is presenting with acute right thumb pain.  The pain is been ongoing for 3 weeks.  The pain is on the ulnar aspect of the proximal phalanges.  Denies any inciting event.  Pain seems to be getting worse.  No improvement with home modalities.  Is tender to the touch.  Denies any inciting event.  She works at a keyboard most of the day.  Pain is sharp and stabbing in nature.  Pain is worse when she moves the thumb in certain directions.  Seems to be localized to this area with no radiation proximally..  Independent review of the right thumb x-ray from 1/24 shows no acute abnormality.   Review of Systems  Constitutional: Negative for fever.  HENT: Negative for congestion.   Respiratory: Negative for cough.   Cardiovascular: Negative for chest pain.  Gastrointestinal: Negative for abdominal pain.  Musculoskeletal: Negative for gait problem.  Skin: Negative for color change.  Neurological: Negative for seizures.  Hematological: Negative for adenopathy.  Psychiatric/Behavioral: Negative for agitation.    HISTORY: Past Medical, Surgical, Social, and Family History Reviewed & Updated per EMR.   Pertinent Historical Findings include:  Past Medical History:  Diagnosis Date  . Anxiety   . DEPRESSION   . Edema   . GERD   . MENOPAUSAL SYNDROME   . OBESITY   . UTI'S, CHRONIC     Past Surgical History:  Procedure Laterality Date  . ABDOMINAL HYSTERECTOMY     total fibroids  . CESAREAN SECTION      Allergies  Allergen Reactions  . Pennsaid [Diclofenac Sodium] Rash  . Oxycodone     Hallucinations  . Shellfish Allergy     Family History  Problem Relation Age of Onset  . Coronary artery disease Mother   . Hypertension Mother   . Diabetes Mother   .  Atrial fibrillation Mother   . Seizures Father   . Lupus Cousin   . Breast cancer Sister 58     Social History   Socioeconomic History  . Marital status: Married    Spouse name: Not on file  . Number of children: Not on file  . Years of education: Not on file  . Highest education level: Not on file  Occupational History  . Not on file  Social Needs  . Financial resource strain: Not on file  . Food insecurity:    Worry: Not on file    Inability: Not on file  . Transportation needs:    Medical: Not on file    Non-medical: Not on file  Tobacco Use  . Smoking status: Former Smoker    Packs/day: 0.30    Years: 20.00    Pack years: 6.00    Types: Cigarettes  . Smokeless tobacco: Former Systems developer    Quit date: 10/05/1987  Substance and Sexual Activity  . Alcohol use: No  . Drug use: No  . Sexual activity: Not on file  Lifestyle  . Physical activity:    Days per week: Not on file    Minutes per session: Not on file  . Stress: Not on file  Relationships  . Social connections:    Talks on phone: Not on file    Gets  together: Not on file    Attends religious service: Not on file    Active member of club or organization: Not on file    Attends meetings of clubs or organizations: Not on file    Relationship status: Not on file  . Intimate partner violence:    Fear of current or ex partner: Not on file    Emotionally abused: Not on file    Physically abused: Not on file    Forced sexual activity: Not on file  Other Topics Concern  . Not on file  Social History Narrative  . Not on file     PHYSICAL EXAM:  VS: BP 122/74   Pulse 64   Ht 5\' 2"  (1.575 m)   Wt 230 lb (104.3 kg)   SpO2 97%   BMI 42.07 kg/m  Physical Exam Gen: NAD, alert, cooperative with exam, well-appearing ENT: normal lips, normal nasal mucosa,  Eye: normal EOM, normal conjunctiva and lids CV:  no edema, +2 pedal pulses   Resp: no accessory muscle use, non-labored,   Skin: no rashes, no areas of  induration  Neuro: normal tone, normal sensation to touch Psych:  normal insight, alert and oriented MSK:  Right thumb: No ecchymosis or swelling. Normal thumb range of motion. Tenderness to palpation over the ulnar aspect of the proximal phalange No instability with stress testing. Normal strength resistance with pincer grasp and resistance to thumb hyperextension. Negative Finkelstein's test. Neurovascular intact  Limited ultrasound: Right thumb:  Normal-appearing MP joint  IP joint with mild degenerative changes  Dynamic testing does not reveal a significant UCL tear Normal-appearing first dorsal compartment. Bayside joint with mild degenerative changes  Summary: unrevealing for specific origin of her pain  Ultrasound and interpretation by Clearance Coots, MD     ASSESSMENT & PLAN:   Thumb pain, right Unclear if she has some mild instability at the MP joint that was not picked up on ultrasound.  Does not appear to be de Quervain's -Placed in a thumb spica splint -Provided prednisone. -Counseled on home exercise therapy and supportive care. -Follow-up in 2 weeks.  If no improvement consider physical therapy versus injection

## 2018-11-06 NOTE — Assessment & Plan Note (Signed)
Unclear if she has some mild instability at the MP joint that was not picked up on ultrasound.  Does not appear to be de Quervain's -Placed in a thumb spica splint -Provided prednisone. -Counseled on home exercise therapy and supportive care. -Follow-up in 2 weeks.  If no improvement consider physical therapy versus injection

## 2018-11-09 ENCOUNTER — Telehealth: Payer: Self-pay

## 2018-11-09 NOTE — Telephone Encounter (Signed)
Called to schedule care guide follow up: Scheduled for 2/24 at 4:15. May be a little late

## 2018-11-13 ENCOUNTER — Encounter: Payer: Self-pay | Admitting: Podiatry

## 2018-11-13 ENCOUNTER — Ambulatory Visit (INDEPENDENT_AMBULATORY_CARE_PROVIDER_SITE_OTHER): Payer: BC Managed Care – PPO | Admitting: Podiatry

## 2018-11-13 VITALS — BP 121/79 | HR 67

## 2018-11-13 DIAGNOSIS — B351 Tinea unguium: Secondary | ICD-10-CM

## 2018-11-13 DIAGNOSIS — B353 Tinea pedis: Secondary | ICD-10-CM | POA: Diagnosis not present

## 2018-11-13 MED ORDER — TERBINAFINE HCL 250 MG PO TABS: 250.0000 mg | ORAL_TABLET | Freq: Every day | ORAL | 0 refills | Status: DC

## 2018-11-13 MED ORDER — CLOTRIMAZOLE-BETAMETHASONE 1-0.05 % EX CREA: 1.0000 "application " | TOPICAL_CREAM | Freq: Two times a day (BID) | CUTANEOUS | 2 refills | Status: DC

## 2018-11-15 ENCOUNTER — Ambulatory Visit (HOSPITAL_BASED_OUTPATIENT_CLINIC_OR_DEPARTMENT_OTHER): Payer: BC Managed Care – PPO | Attending: Pulmonary Disease | Admitting: Pulmonary Disease

## 2018-11-15 DIAGNOSIS — G4761 Periodic limb movement disorder: Secondary | ICD-10-CM | POA: Diagnosis not present

## 2018-11-15 DIAGNOSIS — Z87898 Personal history of other specified conditions: Secondary | ICD-10-CM | POA: Diagnosis not present

## 2018-11-15 DIAGNOSIS — G4733 Obstructive sleep apnea (adult) (pediatric): Secondary | ICD-10-CM | POA: Diagnosis not present

## 2018-11-15 DIAGNOSIS — R0902 Hypoxemia: Secondary | ICD-10-CM | POA: Diagnosis not present

## 2018-11-15 DIAGNOSIS — G4719 Other hypersomnia: Secondary | ICD-10-CM

## 2018-11-17 NOTE — Progress Notes (Signed)
   Subjective: 58 year old female presenting today as a new patient with a chief complaint of dry, cracked skin and calluses of the plantar aspect of the right foot. She reports associated itching. She reports using several OTC products with no significant relief. She denies modifying factors. Patient is here for further evaluation and treatment.   Past Medical History:  Diagnosis Date  . Anxiety   . DEPRESSION   . Edema   . GERD   . MENOPAUSAL SYNDROME   . OBESITY   . UTI'S, CHRONIC     Objective: Physical Exam General: The patient is alert and oriented x3 in no acute distress.  Dermatology: Hyperkeratotic, discolored, thickened, onychodystrophy of the right great toenail noted. Pruritus noted to the bilateral feet with hyperkeratosis. Skin is warm, dry and supple bilateral lower extremities. Negative for open lesions or macerations.  Vascular: Palpable pedal pulses bilaterally. No edema or erythema noted. Capillary refill within normal limits.  Neurological: Epicritic and protective threshold grossly intact bilaterally.   Musculoskeletal Exam: Range of motion within normal limits to all pedal and ankle joints bilateral. Muscle strength 5/5 in all groups bilateral.   Assessment: #1 onychomycosis right hallux  #2 tinea pedis right   Plan of Care:  #1 Patient was evaluated. #2 Prescription for Lamisil 250 mg #90 provided to patient.  #3 Prescription for Lotrisone cream provided to patient.  #4 Return to clinic in 4 weeks.    Edrick Kins, DPM [facility]  Dr. Edrick Kins, [address]                                        [place], [place] [address]                Office [phone]  Fax [phone]

## 2018-11-19 ENCOUNTER — Other Ambulatory Visit: Payer: Self-pay | Admitting: Nurse Practitioner

## 2018-11-19 DIAGNOSIS — G8929 Other chronic pain: Secondary | ICD-10-CM

## 2018-11-19 DIAGNOSIS — M25519 Pain in unspecified shoulder: Principal | ICD-10-CM

## 2018-11-20 ENCOUNTER — Other Ambulatory Visit: Payer: Self-pay | Admitting: *Deleted

## 2018-11-20 ENCOUNTER — Telehealth: Payer: Self-pay | Admitting: Pulmonary Disease

## 2018-11-20 ENCOUNTER — Ambulatory Visit (INDEPENDENT_AMBULATORY_CARE_PROVIDER_SITE_OTHER): Payer: BC Managed Care – PPO

## 2018-11-20 DIAGNOSIS — R05 Cough: Secondary | ICD-10-CM

## 2018-11-20 DIAGNOSIS — R002 Palpitations: Secondary | ICD-10-CM

## 2018-11-20 DIAGNOSIS — R059 Cough, unspecified: Secondary | ICD-10-CM

## 2018-11-20 DIAGNOSIS — G4734 Idiopathic sleep related nonobstructive alveolar hypoventilation: Secondary | ICD-10-CM

## 2018-11-20 MED ORDER — ALBUTEROL SULFATE HFA 108 (90 BASE) MCG/ACT IN AERS: INHALATION_SPRAY | RESPIRATORY_TRACT | 1 refills | Status: DC

## 2018-11-20 NOTE — Telephone Encounter (Signed)
Sleep study result  Date of study: 11/15/2018  Mild sleep disordered breathing Periodic limb movement of sleep Oxygen the saturations  Schedule for follow-up in the office to discuss further management plans and options of treatment.  Sleep position optimization for snoring-Elevation of the head of the bed, encourage lateral sleep position Overnight oximetry to assess for supplemental oxygen requirement should be scheduled

## 2018-11-20 NOTE — Procedures (Signed)
POLYSOMNOGRAPHY  Last, First: Aelyn, Stanaland MRN: [id number] Gender: Female Age (years): 58 Weight (lbs): 231 DOB: 03-20-1961 BMI: 42 Primary Care: No PCP Epworth Score: 16 Referring: Laurin Coder MD Technician: Jacolyn Reedy Interpreting: Laurin Coder MD Study Type: NPSG Ordered Study Type: Split Night CPAP Study date: 11/15/2018 Location: [facility] CLINICAL INFORMATION Maryagnes Nale is a 58 year old Female and was referred to the sleep center for evaluation of N/A. Indications include Excessive Daytime Sleepiness, Snoring.  MEDICATIONS Patient self administered medications include: N/A. Medications administered during study include No sleep medicine administered.  SLEEP STUDY TECHNIQUE A multi-channel overnight Polysomnography study was performed. The channels recorded and monitored were central and occipital EEG, electrooculogram (EOG), submentalis EMG (chin), nasal and oral airflow, thoracic and abdominal wall motion, anterior tibialis EMG, snore microphone, electrocardiogram, and a pulse oximetry. TECHNICIAN COMMENTS Comments added by Technician: NONE Comments added by Scorer: N/A SLEEP ARCHITECTURE The study was initiated at 8:37:48 AM and terminated at 2:49:21 PM. The total recorded time was 371.6 minutes. EEG confirmed total sleep time was 327 minutes yielding a sleep efficiency of 88.0%%. Sleep onset after lights out was 6.8 minutes with a REM latency of 62.5 minutes. The patient spent 14.1%% of the night in stage N1 sleep, 70.0%% in stage N2 sleep, 0.8%% in stage N3 and 15.1% in REM. Wake after sleep onset (WASO) was 37.7 minutes. The Arousal Index was 38.2/hour. RESPIRATORY PARAMETERS There were a total of 38 respiratory disturbances out of which 18 were apneas ( 14 obstructive, 0 mixed, 4 central) and 20 hypopneas. The apnea/hypopnea index (AHI) was 7.0 events/hour. The central sleep apnea index was 0.7 events/hour. The REM AHI was 40.0 events/hour and  NREM AHI was 1.1 events/hour. The supine AHI was 9.9 events/hour and the non supine AHI was 1.6 supine during 64.83% of sleep. Respiratory disturbances were associated with oxygen desaturation down to a nadir of 72.0% during sleep. The mean oxygen saturation during the study was 94.5%. The cumulative time under 88% oxygen saturation was 5.5 minutes.  LEG MOVEMENT DATA The total leg movements were 1128 with a resulting leg movement index of 207.0/hr .Associated arousal with leg movement index was 13.9/hr.  CARDIAC DATA The underlying cardiac rhythm was most consistent with sinus rhythm. Mean heart rate during sleep was 62.5 bpm. Additional rhythm abnormalities include None. IMPRESSIONS - Mild Obstructive Sleep apnea(OSA) - EKG showed no cardiac abnormalities. - Severe Oxygen Desaturation - The patient snored with loud snoring volume. - No significant periodic leg movements(PLMs) during sleep, associated arousals were significant with an arousal index of 13.9 /hour.  DIAGNOSIS - Obstructive Sleep Apnea (327.23 [G47.33 ICD-10]) - Periodic Limb Movement During Sleep (327.51 [G47.61 ICD-10]) - Nocturnal Hypoxemia (327.26 [G47.36 ICD-10])  RECOMMENDATIONS - Positional therapy avoiding supine position during sleep. - Return to discuss possibility of restless legs leading to periodic limb movements and treatment with Mirapex, Requip, or Sinemet for treatment of Periodic Leg Movements of Sleep. - Very mild obstructive sleep apnea. Return to discuss treatment options. - Avoid alcohol, sedatives and other CNS depressants that may worsen sleep apnea and disrupt normal sleep architecture. - Sleep hygiene should be reviewed to assess factors that may improve sleep quality. - Weight management and regular exercise should be initiated or continued.  [Electronically signed] 11/20/2018 06:13 AM  Sherrilyn Rist MD NPI: [id number]

## 2018-11-20 NOTE — Telephone Encounter (Signed)
Patient is aware and ov made as well as ono placed.

## 2018-11-24 ENCOUNTER — Encounter: Payer: Self-pay | Admitting: Family Medicine

## 2018-11-24 ENCOUNTER — Ambulatory Visit (INDEPENDENT_AMBULATORY_CARE_PROVIDER_SITE_OTHER): Payer: BC Managed Care – PPO | Admitting: Family Medicine

## 2018-11-24 DIAGNOSIS — M79644 Pain in right finger(s): Secondary | ICD-10-CM
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
## 2018-11-24 NOTE — Patient Instructions (Signed)
Good to see you  Please try the exercises  Please see me back if your symptom return.

## 2018-11-27 ENCOUNTER — Ambulatory Visit: Payer: BC Managed Care – PPO

## 2018-11-27 NOTE — Assessment & Plan Note (Signed)
Has had significant improvement in her thumb pain  - counseled on HEP and supportive care - can f/u as needed.

## 2018-11-27 NOTE — Progress Notes (Deleted)
3/16 4:15 

## 2018-12-12 ENCOUNTER — Ambulatory Visit: Payer: BC Managed Care – PPO | Admitting: Pulmonary Disease

## 2018-12-12 ENCOUNTER — Telehealth: Payer: Self-pay | Admitting: Pulmonary Disease

## 2018-12-12 NOTE — Telephone Encounter (Signed)
Called and spoke with [facility] they have not been able to get the patient to do a do ono. So we will need to reschedule the appointment for today until after she has ono done. Asked that she call back. Will call back.

## 2018-12-13 ENCOUNTER — Encounter: Payer: Self-pay | Admitting: Podiatry

## 2018-12-13 ENCOUNTER — Other Ambulatory Visit: Payer: Self-pay

## 2018-12-13 ENCOUNTER — Ambulatory Visit (INDEPENDENT_AMBULATORY_CARE_PROVIDER_SITE_OTHER): Payer: BC Managed Care – PPO | Admitting: Podiatry

## 2018-12-13 DIAGNOSIS — B351 Tinea unguium: Secondary | ICD-10-CM

## 2018-12-13 DIAGNOSIS — B353 Tinea pedis: Secondary | ICD-10-CM

## 2018-12-18 ENCOUNTER — Ambulatory Visit: Payer: BC Managed Care – PPO
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
## 2018-12-26 ENCOUNTER — Telehealth: Payer: Self-pay | Admitting: Pulmonary Disease

## 2018-12-26 NOTE — Telephone Encounter (Signed)
Overnight oximetry did reveal significant desaturations  Recommend 2 L of oxygen supplementation at night  Repeat overnight oximetry following initiation of oxygen supplementation

## 2019-01-03 ENCOUNTER — Other Ambulatory Visit: Payer: Self-pay | Admitting: *Deleted

## 2019-01-03 DIAGNOSIS — M25519 Pain in unspecified shoulder: Principal | ICD-10-CM

## 2019-01-03 DIAGNOSIS — G8929 Other chronic pain: Secondary | ICD-10-CM

## 2019-01-03 MED ORDER — MELOXICAM 15 MG PO TABS: 15.0000 mg | ORAL_TABLET | Freq: Every day | ORAL | 0 refills | Status: DC

## 2019-01-03 NOTE — Telephone Encounter (Signed)
lmom 

## 2019-01-04 ENCOUNTER — Telehealth: Payer: Self-pay

## 2019-01-04 NOTE — Telephone Encounter (Signed)
Virtual Visit Pre-Appointment Phone Call  Steps For Call:  1. Confirm consent - "In the setting of the current Covid19 crisis, you are scheduled for a (phone or video) visit with your provider on (date) at (time).  Just as we do with many in-office visits, in order for you to participate in this visit, we must obtain consent.  If you'd like, I can send this to your mychart (if signed up) or email for you to review.  Otherwise, I can obtain your verbal consent now.  All virtual visits are billed to your insurance company just like a normal visit would be.  By agreeing to a virtual visit, we'd like you to understand that the technology does not allow for your provider to perform an examination, and thus may limit your provider's ability to fully assess your condition.  Finally, though the technology is pretty good, we cannot assure that it will always work on either your or our end, and in the setting of a video visit, we may have to convert it to a phone-only visit.  In either situation, we cannot ensure that we have a secure connection.  Are you willing to proceed?"  2. Give patient instructions for WebEx download to smartphone as below if video visit  3. Advise patient to be prepared with any vital sign or heart rhythm information, their current medicines, and a piece of paper and pen handy for any instructions they may receive the day of their visit  4. Inform patient they will receive a phone call 15 minutes prior to their appointment time (may be from unknown caller ID) so they should be prepared to answer  5. Confirm that appointment type is correct in Epic appointment notes (video vs telephone)    TELEPHONE CALL NOTE  Molly Bean has been deemed a candidate for a follow-up tele-health visit to limit community exposure during the Covid-19 pandemic. I spoke with the patient via phone to ensure availability of phone/video source, confirm preferred email & phone number, and discuss  instructions and expectations.  I reminded Molly Bean to be prepared with any vital sign and/or heart rhythm information that could potentially be obtained via home monitoring, at the time of her visit. I reminded Molly Bean to expect a phone call at the time of her visit if her visit.  Did the patient verbally acknowledge consent to treatment? YES  Molly Bean, CMA 01/04/2019 10:35 AM   DOWNLOADING THE [employer]  - If Apple, go to [employer] and type in WebEx in the search bar. [place] [employer], the blue/green circle. The app is free but as with any other app downloads, their phone may require them to verify saved payment information or Apple password. The patient does NOT have to create an account.  - If Android, ask patient to go to [employer] and type in WebEx in the search bar. [place] [employer], the blue/green circle. The app is free but as with any other app downloads, their phone may require them to verify saved payment information or Android password. The patient does NOT have to create an account.   CONSENT FOR TELE-HEALTH VISIT - PLEASE REVIEW  I hereby voluntarily request, consent and authorize [facility] and its employed or contracted physicians, physician assistants, nurse practitioners or other licensed health care professionals (the Practitioner), to provide me with telemedicine health care services (the Services") as deemed necessary by the treating Practitioner. I acknowledge and  consent to receive the Services by the Practitioner via telemedicine. I understand that the telemedicine visit will involve communicating with the Practitioner through live audiovisual communication technology and the disclosure of certain medical information by electronic transmission. I acknowledge that I have been given the opportunity to request an in-person assessment or other available alternative prior to the telemedicine visit  and am voluntarily participating in the telemedicine visit.  I understand that I have the right to withhold or withdraw my consent to the use of telemedicine in the course of my care at any time, without affecting my right to future care or treatment, and that the Practitioner or I may terminate the telemedicine visit at any time. I understand that I have the right to inspect all information obtained and/or recorded in the course of the telemedicine visit and may receive copies of available information for a reasonable fee.  I understand that some of the potential risks of receiving the Services via telemedicine include:   Delay or interruption in medical evaluation due to technological equipment failure or disruption;  Information transmitted may not be sufficient (e.g. poor resolution of images) to allow for appropriate medical decision making by the Practitioner; and/or   In rare instances, security protocols could fail, causing a breach of personal health information.  Furthermore, I acknowledge that it is my responsibility to provide information about my medical history, conditions and care that is complete and accurate to the best of my ability. I acknowledge that Practitioner's advice, recommendations, and/or decision may be based on factors not within their control, such as incomplete or inaccurate data provided by me or distortions of diagnostic images or specimens that may result from electronic transmissions. I understand that the practice of medicine is not an exact science and that Practitioner makes no warranties or guarantees regarding treatment outcomes. I acknowledge that I will receive a copy of this consent concurrently upon execution via email to the email address I last provided but may also request a printed copy by calling the office of [facility].    I understand that my insurance will be billed for this visit.   I have read or had this consent read to me.  I understand  the contents of this consent, which adequately explains the benefits and risks of the Services being provided via telemedicine.   I have been provided ample opportunity to ask questions regarding this consent and the Services and have had my questions answered to my satisfaction.  I give my informed consent for the services to be provided through the use of telemedicine in my medical care  By participating in this telemedicine visit I agree to the above.  Molly Bean, CMA  01/04/2019 10:25 AM       Cardiac Questionnaire:    Since your last visit or hospitalization:    1. Have you been having new or worsening chest pain? NO  2. Have you been having new or worsening shortness of breath? NO 3. Have you been having new or worsening leg swelling, wt gain, or increase in abdominal girth (pants fitting more tightly)? NO   4. Have you had any passing out spells? NO      Patient states that she would like to keep her appointment but do a virtual visit to speak with dr. Margaretann Loveless about her concerns with her results from her monitor.

## 2019-01-08 ENCOUNTER — Telehealth: Payer: Self-pay | Admitting: *Deleted

## 2019-01-08 NOTE — Telephone Encounter (Signed)
Late entry spoke to patient on 01/05/19 - result of monitor given - also instruction sent to [facility] for  E visit  On 01/22/19.  patient verbalized understanding of webex

## 2019-01-11 NOTE — Telephone Encounter (Signed)
[email or web address]

## 2019-01-18 ENCOUNTER — Other Ambulatory Visit: Payer: Self-pay | Admitting: *Deleted

## 2019-01-18 DIAGNOSIS — R05 Cough: Secondary | ICD-10-CM

## 2019-01-18 DIAGNOSIS — K219 Gastro-esophageal reflux disease without esophagitis: Secondary | ICD-10-CM

## 2019-01-18 DIAGNOSIS — R059 Cough, unspecified: Secondary | ICD-10-CM

## 2019-01-18 MED ORDER — PANTOPRAZOLE SODIUM 40 MG PO TBEC: 40.0000 mg | DELAYED_RELEASE_TABLET | Freq: Every day | ORAL | 0 refills | Status: DC

## 2019-01-19 ENCOUNTER — Telehealth: Payer: Self-pay | Admitting: Internal Medicine

## 2019-01-19 NOTE — Telephone Encounter (Signed)
Smartphone/ my chart/ virtual consent/ pre reg completed °

## 2019-01-19 NOTE — Telephone Encounter (Signed)
LMTCB

## 2019-01-22 ENCOUNTER — Telehealth (INDEPENDENT_AMBULATORY_CARE_PROVIDER_SITE_OTHER): Payer: BC Managed Care – PPO | Admitting: Internal Medicine

## 2019-01-22 ENCOUNTER — Encounter: Payer: Self-pay | Admitting: Internal Medicine

## 2019-01-22 VITALS — BP 144/80 | HR 66 | Ht 62.0 in

## 2019-01-22 DIAGNOSIS — F419 Anxiety disorder, unspecified: Secondary | ICD-10-CM

## 2019-01-22 DIAGNOSIS — R05 Cough: Secondary | ICD-10-CM

## 2019-01-22 DIAGNOSIS — R002 Palpitations: Secondary | ICD-10-CM

## 2019-01-22 DIAGNOSIS — I10 Essential (primary) hypertension: Secondary | ICD-10-CM

## 2019-01-22 DIAGNOSIS — R600 Localized edema: Secondary | ICD-10-CM

## 2019-01-22 DIAGNOSIS — R0602 Shortness of breath: Secondary | ICD-10-CM

## 2019-01-22 DIAGNOSIS — R0683 Snoring: Secondary | ICD-10-CM

## 2019-01-22 DIAGNOSIS — R059 Cough, unspecified: Secondary | ICD-10-CM

## 2019-01-22 MED ORDER — AMLODIPINE BESYLATE 10 MG PO TABS: 10.0000 mg | ORAL_TABLET | Freq: Every day | ORAL | 3 refills | Status: DC

## 2019-01-22 NOTE — Patient Instructions (Addendum)
Medication Instructions:  INCREASE YOUR AMLODIPINE TO 10 MG DAILY  If you need a refill on your cardiac medications before your next appointment, please call your pharmacy.   Lab work: NONE   If you have labs (blood work) drawn today and your tests are completely normal, you will receive your results only by: Marland Kitchen MyChart Message (if you have MyChart) OR . A paper copy in the mail If you have any lab test that is abnormal or we need to change your treatment, we will call you to review the results.  Testing/Procedures:NONE  Follow-Up: Your physician recommends that you schedule a follow-up appointment in: 3 month 04/23/19 at 9:30 am

## 2019-01-22 NOTE — Progress Notes (Signed)
Virtual Visit via Video Note   This visit type was conducted due to national recommendations for restrictions regarding the COVID-19 Pandemic (e.g. social distancing) in an effort to limit this patient's exposure and mitigate transmission in our community.  Due to her co-morbid illnesses, this patient is at least at moderate risk for complications without adequate follow up.  This format is felt to be most appropriate for this patient at this time.  All issues noted in this document were discussed and addressed.  A limited physical exam was performed with this format.  Please refer to the patient's chart for her consent to telehealth for [facility].   Evaluation Performed:  Follow-up visit  Date:  01/22/2019   ID:  Molly Bean, DOB [birth year], MRN [id number]  Patient Location: Home Provider Location: Home  PCP:  Lance Sell, NP  Cardiologist:  Elouise Munroe, MD  Electrophysiologist:  None   Chief Complaint:  Follow up SOB and palpitations  History of Present Illness:    Molly Bean is a 58 y.o. female well known to my clinic who is following up for SOB and palpitations.   She denies bothersome palpitations. We reviewed the results of the Holter monitor. I have offered her the addition of metoprolol if needed for bothersome palpitations, however she would like to defer this at this time unless she becomes more symptomatic.   She is trying to stay active during the quarantine time. She notes mildly elevated blood pressure readings, and continues on Maxide and Norvasc without issue. She continues to have daily life stressors but they are improving, and she appears to be in good spirits today.   The patient denies chest pain, chest pressure, dyspnea at rest or with exertion, palpitations, PND, orthopnea, or leg swelling. Denies syncope or presyncope. Denies dizziness or lightheadedness.   The patient does not have symptoms concerning for COVID-19 infection (fever,  chills, cough, or new shortness of breath).    Past Medical History:  Diagnosis Date   Anxiety    DEPRESSION    Edema    GERD    MENOPAUSAL SYNDROME    OBESITY    UTI'S, CHRONIC    Past Surgical History:  Procedure Laterality Date   ABDOMINAL HYSTERECTOMY     total fibroids   CESAREAN SECTION       Current Meds  Medication Sig   acetaminophen (TYLENOL) 500 MG tablet Take 1,000 mg by mouth every 6 (six) hours as needed for mild pain or headache.   albuterol (PROVENTIL HFA;VENTOLIN HFA) 108 (90 Base) MCG/ACT inhaler TAKE 2 PUFFS BY MOUTH EVERY 6 HOURS AS NEEDED FOR WHEEZE OR SHORTNESS OF BREATH   amLODipine (NORVASC) 10 MG tablet Take 1 tablet (10 mg total) by mouth daily.   sertraline (ZOLOFT) 25 MG tablet Take 1 tablet (25 mg total) by mouth daily.   triamterene-hydrochlorothiazide (MAXZIDE-25) 37.5-25 MG tablet Take 0.5-1 tablets by mouth daily.   [DISCONTINUED] amLODipine (NORVASC) 5 MG tablet Take 1 tablet (5 mg total) by mouth daily.   [DISCONTINUED] meloxicam (MOBIC) 15 MG tablet Take 1 tablet (15 mg total) by mouth daily. (Patient not taking: Reported on 01/29/2019)   [DISCONTINUED] pantoprazole (PROTONIX) 40 MG tablet Take 1 tablet (40 mg total) by mouth daily. (Patient not taking: Reported on 01/29/2019)     Allergies:   Pennsaid [diclofenac sodium]; Oxycodone; and Shellfish allergy   Social History   Tobacco Use   Smoking status: Former Smoker    Packs/day: 0.30  Years: 20.00    Pack years: 6.00    Types: Cigarettes   Smokeless tobacco: Former Systems developer    Quit date: 10/05/1987  Substance Use Topics   Alcohol use: No   Drug use: No     Family Hx: The patient's family history includes Atrial fibrillation in her mother; Breast cancer (age of onset: 25) in her sister; Coronary artery disease in her mother; Diabetes in her mother; Hypertension in her mother; Lupus in her cousin; Seizures in her father.  ROS:   Please see the history of present  illness.     All other systems reviewed and are negative.   Prior CV studies:   The following studies were reviewed today:  Holter monitor 11/20/2018  Labs/Other Tests and Data Reviewed:    EKG:  No ECG reviewed.  Recent Labs: 04/28/2018: ALT 19; BUN 13; Creatinine, Ser 0.86; Potassium 4.1; Pro B Natriuretic peptide (BNP) 100.0; Sodium 142; TSH 1.64 10/27/2018: Hemoglobin 12.6; Platelets 210.0   Recent Lipid Panel Lab Results  Component Value Date/Time   CHOL 172 11/06/2010 04:38 PM   TRIG 104.0 11/06/2010 04:38 PM   HDL 53.70 11/06/2010 04:38 PM   CHOLHDL 3 11/06/2010 04:38 PM   LDLCALC 98 11/06/2010 04:38 PM    Wt Readings from Last 3 Encounters:  01/30/19 232 lb (105.2 kg)  11/24/18 232 lb (105.2 kg)  11/15/18 231 lb (104.8 kg)     Objective:    Vital Signs:  BP (!) 144/80    Pulse 66    Ht 5\' 2"  (1.575 m)    BMI 42.43 kg/m    VITAL SIGNS:  reviewed GEN:  no acute distress EYES:  sclerae anicteric, EOMI - Extraocular Movements Intact RESPIRATORY:  normal respiratory effort, symmetric expansion CARDIOVASCULAR:  no peripheral edema SKIN:  no rash, lesions or ulcers. MUSCULOSKELETAL:  no obvious deformities. NEURO:  alert and oriented x 3, no obvious focal deficit PSYCH:  normal affect  ASSESSMENT & PLAN:    1. Essential hypertension   2. Palpitations   3. SOB (shortness of breath)   4. Snoring   5. Anxiety   6. Pedal edema   7. Cough    HTN - she continues to have mildly elevated blood pressure, we will increase amlodipine to 10 mg a day and monitor for response. We discussed that since she is young and feeling well, we should aim for a goal BP of 120/80 mmHg, and titrate meds accordingly.   Palpitations - stable, likely represent infrequent PACs and PVCs. Will consider metoprolol for symptoms at the patient's direction.   Snoring - will discuss sleep referral at next appt, per patient preference.   Anxiety - addressing this with PCP and stress  management strategies.   Edema - no significant edema, especially no major change after starting amlodipine.   Cough - addressing with GI and PCP. No concern for cardiovascular origin.    COVID-19 Education: The signs and symptoms of COVID-19 were discussed with the patient and how to seek care for testing (follow up with PCP or arrange E-visit).  The importance of social distancing was discussed today.  Time:   Today, I have spent 24 minutes with the patient with telehealth technology discussing the above problems.     Medication Adjustments/Labs and Tests Ordered: Current medicines are reviewed at length with the patient today.  Concerns regarding medicines are outlined above.   Tests Ordered: No orders of the defined types were placed in this encounter.   Medication  Changes: Meds ordered this encounter  Medications   amLODipine (NORVASC) 10 MG tablet    Sig: Take 1 tablet (10 mg total) by mouth daily.    Dispense:  90 tablet    Refill:  3    Disposition:  Follow up in 3 month(s)  Signed, Elouise Munroe, MD  01/22/2019 9:39 AM     [facility]  Medication Instructions:  INCREASE YOUR AMLODIPINE TO 10 MG DAILY  If you need a refill on your cardiac medications before your next appointment, please call your pharmacy.   Lab work: NONE   If you have labs (blood work) drawn today and your tests are completely normal, you will receive your results only by:  [place] (if you have MyChart) OR  A paper copy in the mail If you have any lab test that is abnormal or we need to change your treatment, we will call you to review the results.  Testing/Procedures:NONE  Follow-Up: Your physician recommends that you schedule a follow-up appointment in: 3 month 04/23/19 at 9:30 am

## 2019-01-30 ENCOUNTER — Encounter: Payer: Self-pay | Admitting: Gastroenterology

## 2019-01-30 ENCOUNTER — Other Ambulatory Visit: Payer: Self-pay

## 2019-01-30 ENCOUNTER — Ambulatory Visit (INDEPENDENT_AMBULATORY_CARE_PROVIDER_SITE_OTHER): Payer: BC Managed Care – PPO | Admitting: Gastroenterology

## 2019-01-30 VITALS — BP 188/77 | HR 66 | Ht 62.0 in | Wt 232.0 lb

## 2019-01-30 DIAGNOSIS — R05 Cough: Secondary | ICD-10-CM

## 2019-01-30 DIAGNOSIS — R059 Cough, unspecified: Secondary | ICD-10-CM

## 2019-01-30 MED ORDER — FAMOTIDINE 20 MG PO TABS: 20.0000 mg | ORAL_TABLET | Freq: Every day | ORAL | 3 refills | Status: DC

## 2019-01-30 NOTE — Progress Notes (Signed)
Review of pertinent gastrointestinal problems: 1. PH of adenomatous polyp in colon: Colonoscopy August 2051for routine risk screening, Dr. Owens Loffler. Findings melanosis, otherwise normal examination. She was recommended to have repeat colonoscopy for colon cancer screening at 60-year interval. Colonoscopy Dr. Henrene Pastor 05/2018 for minor rectal bleeding (hb normal), found internal hemorrhoids, 79mm TA. Recommended recall at 5 years.   This service was provided via virtual visit.  I offered audio and visual however she was not sure that she would be able to get the visual portion figured out and so we used just audio for this visit the patient was located at home.  I was located in my office.  The patient did consent to this virtual visit and is aware of possible charges through their insurance for this visit.  She is an established patient however she is here discussing a new problem today..  My certified medical assistant, Grace Bushy, contributed to this visit by contacting the patient by phone 1 or 2 business days prior to the appointment and also followed up on the recommendations I made after the visit.  Time spent on virtual visit: 25 min   HPI: This is a very pleasant 58 year old woman whom I last saw 6 or 8 months ago however she is today here to discuss a new problem.   For 8 to 12 months she has had coughing daily, starts in the morning.  She will awake at night with coughing.  Probably 3 times a week.  She does not have associated burning in her chest or throat.  She has DOE (walking up steps). She has a cardiologist.  Recently had a stress test, sleep study and   No difficulty with swallowing  Weight is stable overall.  Not eating after 6pm, lays down 9-10 at night.  She rarely drinks caffeinated beverages   Chief complaint is coughing  ROS: complete GI ROS as described in HPI, all other review negative.  Constitutional:  No unintentional weight loss   Past Medical  History:  Diagnosis Date  . Anxiety   . DEPRESSION   . Edema   . GERD   . MENOPAUSAL SYNDROME   . OBESITY   . UTI'S, CHRONIC     Past Surgical History:  Procedure Laterality Date  . ABDOMINAL HYSTERECTOMY     total fibroids  . CESAREAN SECTION      Current Outpatient Medications  Medication Sig Dispense Refill  . acetaminophen (TYLENOL) 500 MG tablet Take 1,000 mg by mouth every 6 (six) hours as needed for mild pain or headache.    . albuterol (PROVENTIL HFA;VENTOLIN HFA) 108 (90 Base) MCG/ACT inhaler TAKE 2 PUFFS BY MOUTH EVERY 6 HOURS AS NEEDED FOR WHEEZE OR SHORTNESS OF BREATH 8.5 Inhaler 1  . amLODipine (NORVASC) 10 MG tablet Take 1 tablet (10 mg total) by mouth daily. 90 tablet 3  . polyethylene glycol (MIRALAX / GLYCOLAX) 17 g packet Take 17 g by mouth every other day.    . psyllium (METAMUCIL) 58.6 % packet Take 1 packet by mouth daily.    . sertraline (ZOLOFT) 25 MG tablet Take 1 tablet (25 mg total) by mouth daily. 90 tablet 0  . triamterene-hydrochlorothiazide (MAXZIDE-25) 37.5-25 MG tablet Take 0.5-1 tablets by mouth daily. 90 tablet 1   No current facility-administered medications for this visit.     Allergies as of 01/30/2019 - Review Complete 01/29/2019  Allergen Reaction Noted  . Pennsaid [diclofenac sodium] Rash 06/21/2016  . Oxycodone  03/09/2014  . Shellfish  allergy  09/23/2014    Family History  Problem Relation Age of Onset  . Coronary artery disease Mother   . Hypertension Mother   . Diabetes Mother   . Atrial fibrillation Mother   . Seizures Father   . Lupus Cousin   . Breast cancer Sister 4    Social History   Socioeconomic History  . Marital status: Married    Spouse name: Not on file  . Number of children: Not on file  . Years of education: Not on file  . Highest education level: Not on file  Occupational History  . Not on file  Social Needs  . Financial resource strain: Not on file  . Food insecurity:    Worry: Not on file     Inability: Not on file  . Transportation needs:    Medical: Not on file    Non-medical: Not on file  Tobacco Use  . Smoking status: Former Smoker    Packs/day: 0.30    Years: 20.00    Pack years: 6.00    Types: Cigarettes  . Smokeless tobacco: Former Systems developer    Quit date: 10/05/1987  Substance and Sexual Activity  . Alcohol use: No  . Drug use: No  . Sexual activity: Not on file  Lifestyle  . Physical activity:    Days per week: Not on file    Minutes per session: Not on file  . Stress: Not on file  Relationships  . Social connections:    Talks on phone: Not on file    Gets together: Not on file    Attends religious service: Not on file    Active member of club or organization: Not on file    Attends meetings of clubs or organizations: Not on file    Relationship status: Not on file  . Intimate partner violence:    Fear of current or ex partner: Not on file    Emotionally abused: Not on file    Physically abused: Not on file    Forced sexual activity: Not on file  Other Topics Concern  . Not on file  Social History Narrative  . Not on file     Physical Exam: Unable to perform because this was a "telemed visit" due to current Covid-19 pandemic  Assessment and plan: 58 y.o. female with coughing while laying down  Her coughing might be related to acid irritation from GERD.  That being said she has 0 more classic acid symptoms such as heartburn or pyrosis.  She has no alarm issues of dysphasia, unexplained weight loss or overt GI bleeding.  I recommended that she try H2 blocker at bedtime every night since her coughing is so reliably related to while laying down.  She will call to report on her response in 1 month.  Please see the "Patient Instructions" section for addition details about the plan.  Owens Loffler, MD [facility] 01/30/2019, 1:12 PM

## 2019-01-30 NOTE — Patient Instructions (Signed)
She will start pepcid 20mg , one pill at bedtime every night.  She will call in 1 month to update on her symptom.

## 2019-03-05 ENCOUNTER — Other Ambulatory Visit: Payer: Self-pay | Admitting: *Deleted

## 2019-03-05 DIAGNOSIS — F411 Generalized anxiety disorder: Secondary | ICD-10-CM

## 2019-03-05 MED ORDER — SERTRALINE HCL 25 MG PO TABS: 25.0000 mg | ORAL_TABLET | Freq: Every day | ORAL | 0 refills | Status: DC

## 2019-04-16 ENCOUNTER — Telehealth: Payer: Self-pay

## 2019-04-16 NOTE — Telephone Encounter (Signed)
[id number] appt tele or phone?

## 2019-04-18 NOTE — Progress Notes (Signed)
Subjective:    Patient ID: Molly Bean, female    DOB: Oct 02, 1961, 58 y.o.   MRN: [id number]  HPI The patient is here for an acute visit.  In addition to her acute visit she does need refills of her prescriptions.  She has an appointment to establish with a new PCP next month.  Bump in right axilla: She has a long history of recurrent boils in her bilateral armpits and groin region.  She has needed antibiotics in the past, but sometimes is able to treat them at home.  She has 2 bumps in her right axilla that are very painful and red and she has not been able to pop them on her own.  She has been applying warm compresses.  She has not had any discharge.  She denies any fevers or chills.  She feels that she probably needs antibiotics for these.  She is taking all of her medications as prescribed.  She feels her chronic medical problems are well controlled.  Hypertension: She is taking her medication daily.  She denies chest pain, palpitations, edema, shortness of breath and regular headaches.   Depression, anxiety: She is taking her medication daily as prescribed. She denies any side effects from the medication. She feels her depression and anxiety are well controlled and she is happy with her current dose of medication.        Medications and allergies reviewed with patient and updated if appropriate.  Patient Active Problem List   Diagnosis Date Noted  . Thumb pain, right 11/03/2018  . Essential hypertension 05/01/2018  . Cough 03/24/2018  . SOB (shortness of breath) 03/24/2018  . Asthma 03/14/2018  . Hidradenitis suppurativa 01/18/2018  . Generalized anxiety disorder 10/11/2016  . Chronic shoulder pain 06/09/2016  . MENOPAUSAL SYNDROME 04/23/2009  . OBESITY 02/15/2008  . DEPRESSION 04/12/2007  . Gastroesophageal reflux disease 04/12/2007  . UTI'S, CHRONIC 04/12/2007  . EDEMA 04/12/2007    Current Outpatient Medications on File Prior to Visit  Medication Sig Dispense  Refill  . acetaminophen (TYLENOL) 500 MG tablet Take 1,000 mg by mouth every 6 (six) hours as needed for mild pain or headache.    . albuterol (PROVENTIL HFA;VENTOLIN HFA) 108 (90 Base) MCG/ACT inhaler TAKE 2 PUFFS BY MOUTH EVERY 6 HOURS AS NEEDED FOR WHEEZE OR SHORTNESS OF BREATH 8.5 Inhaler 1  . amLODipine (NORVASC) 10 MG tablet Take 1 tablet (10 mg total) by mouth daily. 90 tablet 3  . famotidine (PEPCID) 20 MG tablet Take 1 tablet (20 mg total) by mouth at bedtime. 30 tablet 3  . polyethylene glycol (MIRALAX / GLYCOLAX) 17 g packet Take 17 g by mouth every other day.    . psyllium (METAMUCIL) 58.6 % packet Take 1 packet by mouth daily.    . sertraline (ZOLOFT) 25 MG tablet Take 1 tablet (25 mg total) by mouth daily. Need appointment to establish with new primary care provider. 90 tablet 0  . triamterene-hydrochlorothiazide (MAXZIDE-25) 37.5-25 MG tablet Take 0.5-1 tablets by mouth daily. 90 tablet 1   No current facility-administered medications on file prior to visit.     Past Medical History:  Diagnosis Date  . Anxiety   . DEPRESSION   . Edema   . GERD   . MENOPAUSAL SYNDROME   . OBESITY   . UTI'S, CHRONIC     Past Surgical History:  Procedure Laterality Date  . ABDOMINAL HYSTERECTOMY     total fibroids  . CESAREAN SECTION  Social History   Socioeconomic History  . Marital status: Married    Spouse name: Not on file  . Number of children: Not on file  . Years of education: Not on file  . Highest education level: Not on file  Occupational History  . Not on file  Social Needs  . Financial resource strain: Not on file  . Food insecurity    Worry: Not on file    Inability: Not on file  . Transportation needs    Medical: Not on file    Non-medical: Not on file  Tobacco Use  . Smoking status: Former Smoker    Packs/day: 0.30    Years: 20.00    Pack years: 6.00    Types: Cigarettes  . Smokeless tobacco: Former Systems developer    Quit date: 10/05/1987  Substance and  Sexual Activity  . Alcohol use: No  . Drug use: No  . Sexual activity: Not on file  Lifestyle  . Physical activity    Days per week: Not on file    Minutes per session: Not on file  . Stress: Not on file  Relationships  . Social Herbalist on phone: Not on file    Gets together: Not on file    Attends religious service: Not on file    Active member of club or organization: Not on file    Attends meetings of clubs or organizations: Not on file    Relationship status: Not on file  Other Topics Concern  . Not on file  Social History Narrative  . Not on file    Family History  Problem Relation Age of Onset  . Coronary artery disease Mother   . Hypertension Mother   . Diabetes Mother   . Atrial fibrillation Mother   . Seizures Father   . Lupus Cousin   . Breast cancer Sister 87    Review of Systems  Constitutional: Negative for chills and fever.  Respiratory: Negative for cough, shortness of breath and wheezing.   Cardiovascular: Negative for chest pain, palpitations and leg swelling.  Skin: Positive for color change. Negative for wound.  Neurological: Negative for light-headedness and headaches.       Objective:   Vitals:   04/19/19 1126  BP: 132/78  Pulse: 75  Resp: 14  Temp: 98.3 F (36.8 C)  SpO2: 98%   BP Readings from Last 3 Encounters:  04/19/19 132/78  01/30/19 (!) 188/77  01/22/19 (!) 144/80   Wt Readings from Last 3 Encounters:  04/19/19 235 lb (106.6 kg)  01/30/19 232 lb (105.2 kg)  11/24/18 232 lb (105.2 kg)   Body mass index is 42.98 kg/m.   Physical Exam Constitutional:      General: She is not in acute distress.    Appearance: Normal appearance. She is obese. She is not ill-appearing.  HENT:     Head: Normocephalic and atraumatic.  Skin:    General: Skin is warm and dry.     Findings: Lesion (2 small boils right axilla, both tender, central boil is erythematous, the other boil is not.  No open wounds or discharge.  Area  under each boil was indurated) present.  Neurological:     Mental Status: She is alert.            Assessment & Plan:    See Problem List for Assessment and Plan of chronic medical problems.

## 2019-04-19 ENCOUNTER — Other Ambulatory Visit: Payer: Self-pay

## 2019-04-19 ENCOUNTER — Ambulatory Visit (INDEPENDENT_AMBULATORY_CARE_PROVIDER_SITE_OTHER): Payer: BC Managed Care – PPO | Admitting: Internal Medicine

## 2019-04-19 ENCOUNTER — Encounter: Payer: Self-pay | Admitting: Internal Medicine

## 2019-04-19 DIAGNOSIS — F411 Generalized anxiety disorder: Secondary | ICD-10-CM | POA: Diagnosis not present

## 2019-04-19 DIAGNOSIS — K219 Gastro-esophageal reflux disease without esophagitis: Secondary | ICD-10-CM

## 2019-04-19 DIAGNOSIS — I10 Essential (primary) hypertension: Secondary | ICD-10-CM

## 2019-04-19 DIAGNOSIS — R05 Cough: Secondary | ICD-10-CM

## 2019-04-19 DIAGNOSIS — F3289 Other specified depressive episodes: Secondary | ICD-10-CM

## 2019-04-19 DIAGNOSIS — R059 Cough, unspecified: Secondary | ICD-10-CM

## 2019-04-19 DIAGNOSIS — L732 Hidradenitis suppurativa: Secondary | ICD-10-CM

## 2019-04-19 MED ORDER — FAMOTIDINE 20 MG PO TABS: 20.0000 mg | ORAL_TABLET | Freq: Every day | ORAL | 1 refills | Status: DC

## 2019-04-19 MED ORDER — SULFAMETHOXAZOLE-TRIMETHOPRIM 800-160 MG PO TABS: 1.0000 | ORAL_TABLET | Freq: Two times a day (BID) | ORAL | 0 refills | Status: DC

## 2019-04-19 MED ORDER — TRIAMTERENE-HCTZ 37.5-25 MG PO TABS: 0.5000 | ORAL_TABLET | Freq: Every day | ORAL | 1 refills | Status: DC

## 2019-04-19 MED ORDER — AMLODIPINE BESYLATE 10 MG PO TABS: 10.0000 mg | ORAL_TABLET | Freq: Every day | ORAL | 3 refills | Status: DC

## 2019-04-19 MED ORDER — SERTRALINE HCL 25 MG PO TABS: 25.0000 mg | ORAL_TABLET | Freq: Every day | ORAL | 0 refills | Status: DC

## 2019-04-19 MED ORDER — ALBUTEROL SULFATE HFA 108 (90 BASE) MCG/ACT IN AERS: INHALATION_SPRAY | RESPIRATORY_TRACT | 5 refills | Status: DC

## 2019-04-19 NOTE — Assessment & Plan Note (Signed)
BP well controlled Current regimen effective and well tolerated Continue current medications at current doses  

## 2019-04-19 NOTE — Assessment & Plan Note (Signed)
Controlled, stable Continue current dose of medication  

## 2019-04-19 NOTE — Assessment & Plan Note (Signed)
Denies any current depression Looks like she was started on sertraline for anxiety problem will be continuing 25 mg daily

## 2019-04-19 NOTE — Assessment & Plan Note (Signed)
Has 2 boils in right armpit that have not responded to conservative measures Start Bactrim twice daily x1 week Recurrent boils Has been given information about the condition in the past Trial of over-the-counter Hibiclens cleanser

## 2019-04-19 NOTE — Patient Instructions (Addendum)
Take the antibiotic as prescribed.  Do warm compresses.    Start using Hibiclens (chlorhexidine gluconate) 4% wash in these areas for prevention.        [facility]  Phone [phone]

## 2019-04-19 NOTE — Assessment & Plan Note (Signed)
She feels her anxiety is controlled Continue sertraline 25 mg daily

## 2019-04-20 ENCOUNTER — Telehealth: Payer: Self-pay | Admitting: Internal Medicine

## 2019-04-20 NOTE — Telephone Encounter (Signed)
LVM , reminding pt of her appt on 04-23-19 with Dr Margaretann Loveless.

## 2019-04-23 ENCOUNTER — Telehealth (INDEPENDENT_AMBULATORY_CARE_PROVIDER_SITE_OTHER): Payer: BC Managed Care – PPO | Admitting: Internal Medicine

## 2019-04-23 ENCOUNTER — Encounter: Payer: Self-pay | Admitting: Internal Medicine

## 2019-04-23 VITALS — BP 126/71 | HR 69 | Ht 62.0 in | Wt 235.0 lb

## 2019-04-23 DIAGNOSIS — R0683 Snoring: Secondary | ICD-10-CM

## 2019-04-23 DIAGNOSIS — I10 Essential (primary) hypertension: Secondary | ICD-10-CM | POA: Diagnosis not present

## 2019-04-23 DIAGNOSIS — R0602 Shortness of breath: Secondary | ICD-10-CM | POA: Diagnosis not present

## 2019-04-23 DIAGNOSIS — R600 Localized edema: Secondary | ICD-10-CM

## 2019-04-23 MED ORDER — FUROSEMIDE 20 MG PO TABS: 20.0000 mg | ORAL_TABLET | Freq: Every day | ORAL | 3 refills | Status: DC

## 2019-04-23 NOTE — Patient Instructions (Signed)
Medication Instructions:  START Furosemide (Lasix) 20 mg daily.  If you need a refill on your cardiac medications before your next appointment, please call your pharmacy.   Lab work: Your physician recommends that you return for lab work in: 1 week--BMET, Magnesium  If you have labs (blood work) drawn today and your tests are completely normal, you will receive your results only by: [place] MyChart Message (if you have MyChart) OR . A paper copy in the mail If you have any lab test that is abnormal or we need to change your treatment, we will call you to review the results.  Follow-Up: At [facility], you and your health needs are our priority.  As part of our continuing mission to provide you with exceptional heart care, we have created designated Provider Care Teams.  These Care Teams include your primary Cardiologist (physician) and Advanced Practice Providers (APPs -  Physician Assistants and Nurse Practitioners) who all work together to provide you with the care you need, when you need it. You will need a follow up appointment in 6 months.  Please call our office 2 months in advance to schedule this appointment.  You may see Elouise Munroe, MD or one of the following Advanced Practice Providers on your designated Care Team:   Rosaria Ferries, PA-C . Jory Sims, DNP, ANP  Any Other Special Instructions Will Be Listed Below (If Applicable). None

## 2019-04-23 NOTE — Progress Notes (Signed)
Virtual Visit via Video Note   This visit type was conducted due to national recommendations for restrictions regarding the COVID-19 Pandemic (e.g. social distancing) in an effort to limit this patient's exposure and mitigate transmission in our community.  Due to her co-morbid illnesses, this patient is at least at moderate risk for complications without adequate follow up.  This format is felt to be most appropriate for this patient at this time.  All issues noted in this document were discussed and addressed.  A limited physical exam was performed with this format.  Please refer to the patient's chart for her consent to telehealth for [facility].   Date:  04/23/2019   ID:  Molly Bean, DOB [birth year], MRN [id number]  Patient Location: Home Provider Location: Home  PCP:  Lance Sell, NP  Cardiologist:  Elouise Munroe, MD  Electrophysiologist:  None   Evaluation Performed:  Follow-Up Visit  Chief Complaint:  HTN, DOE  History of Present Illness:    Molly Bean is a 58 y.o. female with hx of anxiety and GERD, with significant life stressors who presents for 3 mo f/u of shortness of breath.  She still has shortness of breath with activity and admits to gaining weight. She feels weight is part of the reason she is short of breath. Her BP is well controlled today and overall she feels well.   The patient denies chest pain, chest pressure, dyspnea at rest, palpitations, PND, orthopnea. Endorses leg swelling. Denies syncope or presyncope. Denies dizziness or lightheadedness. Endorses snoring and has been evaluated for sleep apnea by pulmonary medicine, results pending per her report.   The patient does not have symptoms concerning for COVID-19 infection (fever, chills, cough, or new shortness of breath).    Past Medical History:  Diagnosis Date  . Anxiety   . DEPRESSION   . Edema   . GERD   . MENOPAUSAL SYNDROME   . OBESITY   . UTI'S, CHRONIC    Past  Surgical History:  Procedure Laterality Date  . ABDOMINAL HYSTERECTOMY     total fibroids  . CESAREAN SECTION       Current Meds  Medication Sig  . acetaminophen (TYLENOL) 500 MG tablet Take 1,000 mg by mouth every 6 (six) hours as needed for mild pain or headache.  . albuterol (VENTOLIN HFA) 108 (90 Base) MCG/ACT inhaler TAKE 2 PUFFS BY MOUTH EVERY 6 HOURS AS NEEDED FOR WHEEZE OR SHORTNESS OF BREATH  . amLODipine (NORVASC) 10 MG tablet Take 1 tablet (10 mg total) by mouth daily.  . famotidine (PEPCID) 20 MG tablet Take 1 tablet (20 mg total) by mouth at bedtime.  . polyethylene glycol (MIRALAX / GLYCOLAX) 17 g packet Take 17 g by mouth every other day.  . psyllium (METAMUCIL) 58.6 % packet Take 1 packet by mouth daily.  . sertraline (ZOLOFT) 25 MG tablet Take 1 tablet (25 mg total) by mouth daily. Need appointment to establish with new primary care provider.  . sulfamethoxazole-trimethoprim (BACTRIM DS) 800-160 MG tablet Take 1 tablet by mouth 2 (two) times daily.  Marland Kitchen triamterene-hydrochlorothiazide (MAXZIDE-25) 37.5-25 MG tablet Take 0.5-1 tablets by mouth daily.     Allergies:   Pennsaid [diclofenac sodium], Oxycodone, and Shellfish allergy   Social History   Tobacco Use  . Smoking status: Former Smoker    Packs/day: 0.30    Years: 20.00    Pack years: 6.00    Types: Cigarettes  . Smokeless tobacco: Former Systems developer  Quit date: 10/05/1987  Substance Use Topics  . Alcohol use: No  . Drug use: No     Family Hx: The patient's family history includes Atrial fibrillation in her mother; Breast cancer (age of onset: 26) in her sister; Coronary artery disease in her mother; Diabetes in her mother; Hypertension in her mother; Lupus in her cousin; Seizures in her father.  ROS:   Please see the history of present illness.     All other systems reviewed and are negative.   Prior CV studies:   The following studies were reviewed today:    Labs/Other Tests and Data Reviewed:     EKG:  No ECG reviewed.  Recent Labs: 04/28/2018: ALT 19; BUN 13; Creatinine, Ser 0.86; Potassium 4.1; Pro B Natriuretic peptide (BNP) 100.0; Sodium 142; TSH 1.64 10/27/2018: Hemoglobin 12.6; Platelets 210.0   Recent Lipid Panel Lab Results  Component Value Date/Time   CHOL 172 11/06/2010 04:38 PM   TRIG 104.0 11/06/2010 04:38 PM   HDL 53.70 11/06/2010 04:38 PM   CHOLHDL 3 11/06/2010 04:38 PM   LDLCALC 98 11/06/2010 04:38 PM    Wt Readings from Last 3 Encounters:  04/23/19 235 lb (106.6 kg)  04/19/19 235 lb (106.6 kg)  01/30/19 232 lb (105.2 kg)     Objective:    Vital Signs:  BP 126/71   Pulse 69   Ht 5\' 2"  (1.575 m)   Wt 235 lb (106.6 kg)   BMI 42.98 kg/m    VITAL SIGNS:  reviewed GEN:  no acute distress EYES:  sclerae anicteric, EOMI - Extraocular Movements Intact RESPIRATORY:  normal respiratory effort, symmetric expansion CARDIOVASCULAR:  no peripheral edema SKIN:  no rash, lesions or ulcers. MUSCULOSKELETAL:  no obvious deformities. NEURO:  alert and oriented x 3, no obvious focal deficit PSYCH:  normal affect  ASSESSMENT & PLAN:    1. SOB (shortness of breath)   2. Pedal edema   3. Snoring   4. Essential hypertension    DOE - she continues to experience DOE despite optimization of BP. This maybe due to elevated LVEDP and diastolic dysfunction, we will trial lasix 20 mg daily to evaluate if she has any improvement in sx. BMET and mag in 1 week to ensure lytes and renal function remain intact.  HTN - well controlled, continue current regimen. Adding lasix may also help with this.   Snoring - follow up sleep results with pulmonary medicine.  Edema - not worsened by amlodipine, may be venous stasis, will evaluate for improvement with lasix.   Lifestyle - Try to exercise at home, suggested at home activities due to heat.   COVID-19 Education: The signs and symptoms of COVID-19 were discussed with the patient and how to seek care for testing (follow up with  PCP or arrange E-visit).  The importance of social distancing was discussed today.  Time:   Today, I have spent 25 minutes with the patient with telehealth technology discussing the above problems.     Medication Adjustments/Labs and Tests Ordered: Current medicines are reviewed at length with the patient today.  Concerns regarding medicines are outlined above.   Tests Ordered: No orders of the defined types were placed in this encounter.   Medication Changes: No orders of the defined types were placed in this encounter.   Follow Up:  Virtual Visit or In Person in 6 month(s)  Signed, Elouise Munroe, MD  04/23/2019 9:45 AM    [place]

## 2019-04-24 LAB — HM MAMMOGRAPHY

## 2019-04-24 NOTE — Telephone Encounter (Signed)
UNABLE TO CONTACT PT WILL AWAIT CB

## 2019-04-30 ENCOUNTER — Ambulatory Visit: Payer: BC Managed Care – PPO | Admitting: Nurse Practitioner

## 2019-05-17 ENCOUNTER — Encounter: Payer: Self-pay | Admitting: Nurse Practitioner

## 2019-05-17 NOTE — Progress Notes (Signed)
Outside notes received. Information abstracted. Notes sent to scan.  

## 2019-05-21 ENCOUNTER — Encounter: Payer: Self-pay | Admitting: Family

## 2019-05-21 ENCOUNTER — Other Ambulatory Visit (INDEPENDENT_AMBULATORY_CARE_PROVIDER_SITE_OTHER): Payer: BC Managed Care – PPO

## 2019-05-21 ENCOUNTER — Other Ambulatory Visit: Payer: Self-pay

## 2019-05-21 ENCOUNTER — Ambulatory Visit (INDEPENDENT_AMBULATORY_CARE_PROVIDER_SITE_OTHER): Payer: BC Managed Care – PPO | Admitting: Family

## 2019-05-21 VITALS — BP 126/78 | HR 77 | Temp 98.3°F | Ht 62.0 in | Wt 235.4 lb

## 2019-05-21 DIAGNOSIS — R0602 Shortness of breath: Secondary | ICD-10-CM | POA: Diagnosis not present

## 2019-05-21 DIAGNOSIS — F411 Generalized anxiety disorder: Secondary | ICD-10-CM | POA: Diagnosis not present

## 2019-05-21 DIAGNOSIS — I10 Essential (primary) hypertension: Secondary | ICD-10-CM | POA: Diagnosis not present

## 2019-05-21 DIAGNOSIS — L732 Hidradenitis suppurativa: Secondary | ICD-10-CM

## 2019-05-21 DIAGNOSIS — Z23 Encounter for immunization: Secondary | ICD-10-CM | POA: Diagnosis not present

## 2019-05-21 LAB — MAGNESIUM: Magnesium: 2 mg/dL (ref 1.5–2.5)

## 2019-05-21 LAB — COMPREHENSIVE METABOLIC PANEL
ALT: 14 U/L (ref 0–35)
AST: 13 U/L (ref 0–37)
Albumin: 4.2 g/dL (ref 3.5–5.2)
Alkaline Phosphatase: 76 U/L (ref 39–117)
BUN: 12 mg/dL (ref 6–23)
CO2: 29 mEq/L (ref 19–32)
Calcium: 9.4 mg/dL (ref 8.4–10.5)
Chloride: 104 mEq/L (ref 96–112)
Creatinine, Ser: 0.91 mg/dL (ref 0.40–1.20)
GFR: 76.29 mL/min (ref >60.00)
Glucose, Bld: 82 mg/dL (ref 70–99)
Potassium: 3.7 mEq/L (ref 3.5–5.1)
Sodium: 140 mEq/L (ref 135–145)
Total Bilirubin: 0.2 mg/dL (ref 0.2–1.2)
Total Protein: 7.3 g/dL (ref 6.0–8.3)

## 2019-05-21 NOTE — Patient Instructions (Addendum)
Please call your pulmonologist to follow-up on your sleep study results ( notes from April 2020); Call your cardiologist to verify if you are supposed to be on both Triamterene/HCTZ and Lasix;      Please try using Hibiclens OTC wash to help with the recurrent boils;      Hidradenitis Suppurativa Hidradenitis suppurativa is a long-term (chronic) skin disease. It is similar to a severe form of acne, but it affects areas of the body where acne would be unusual, especially areas of the body where skin rubs against skin and becomes moist. These include:  Underarms.  Groin.  Genital area.  Buttocks.  Upper thighs.  Breasts. Hidradenitis suppurativa may start out as small lumps or pimples caused by blocked sweat glands or hair follicles. Pimples may develop into deep sores that break open (rupture) and drain pus. Over time, affected areas of skin may thicken and become scarred. This condition is rare and does not spread from person to person (non-contagious). What are the causes? The exact cause of this condition is not known. It may be related to:  Female and female hormones.  An overactive disease-fighting system (immune system). The immune system may over-react to blocked hair follicles or sweat glands and cause swelling and pus-filled sores. What increases the risk? You are more likely to develop this condition if you:  Are female.  Are 67-65 years old.  Have a family history of hidradenitis suppurativa.  Have a personal history of acne.  Are overweight.  Smoke.  Take the medicine lithium. What are the signs or symptoms? The first symptoms are usually painful bumps in the skin, similar to pimples. The condition may get worse over time (progress), or it may only cause mild symptoms. If the disease progresses, symptoms may include:  Skin bumps getting bigger and growing deeper into the skin.  Bumps rupturing and draining pus.  Itchy, infected skin.  Skin getting  thicker and scarred.  Tunnels under the skin (fistulas) where pus drains from a bump.  Pain during daily activities, such as pain during walking if your groin area is affected.  Emotional problems, such as stress or depression. This condition may affect your appearance and your ability or willingness to wear certain clothes or do certain activities. How is this diagnosed? This condition is diagnosed by a health care provider who specializes in skin diseases (dermatologist). You may be diagnosed based on:  Your symptoms and medical history.  A physical exam.  Testing a pus sample for infection.  Blood tests. How is this treated? Your treatment will depend on how severe your symptoms are. The same treatment will not work for everybody with this condition. You may need to try several treatments to find what works best for you. Treatment may include:  Cleaning and bandaging (dressing) your wounds as needed.  Lifestyle changes, such as new skin care routines.  Taking medicines, such as: ? Antibiotics. ? Acne medicines. ? Medicines to reduce the activity of the immune system. ? A diabetes medicine (metformin). ? Birth control pills, for women. ? Steroids to reduce swelling and pain.  Working with a mental health care provider, if you experience emotional distress due to this condition. If you have severe symptoms that do not get better with medicine, you may need surgery. Surgery may involve:  Using a laser to clear the skin and remove hair follicles.  Opening and draining deep sores.  Removing the areas of skin that are diseased and scarred. Follow these instructions at  home: Medicines   Take over-the-counter and prescription medicines only as told by your health care provider.  If you were prescribed an antibiotic medicine, take it as told by your health care provider. Do not stop taking the antibiotic even if your condition improves. Skin care  If you have open wounds,  cover them with a clean dressing as told by your health care provider. Keep wounds clean by washing them gently with soap and water when you bathe.  Do not shave the areas where you get hidradenitis suppurativa.  Do not wear deodorant.  Wear loose-fitting clothes.  Try to avoid getting overheated or sweaty. If you get sweaty or wet, change into clean, dry clothes as soon as you can.  To help relieve pain and itchiness, cover sore areas with a warm, clean washcloth (warm compress) for 5-10 minutes as often as needed.  If told by your health care provider, take a bleach bath twice a week: ? Fill your bathtub halfway with water. ? Pour in  cup of unscented household bleach. ? Soak in the tub for 5-10 minutes. ? Only soak from the neck down. Avoid water on your face and hair. ? Shower to rinse off the bleach from your skin. General instructions  Learn as much as you can about your disease so that you have an active role in your treatment. Work closely with your health care provider to find treatments that work for you.  If you are overweight, work with your health care provider to lose weight as recommended.  Do not use any products that contain nicotine or tobacco, such as cigarettes and e-cigarettes. If you need help quitting, ask your health care provider.  If you struggle with living with this condition, talk with your health care provider or work with a mental health care provider as recommended.  Keep all follow-up visits as told by your health care provider. This is important. Where to find more information  Hidradenitis [place].: [email or web address]-foundation.org/ Contact a health care provider if you have:  A flare-up of hidradenitis suppurativa.  A fever or chills.  Trouble controlling your symptoms at home.  Trouble doing your daily activities because of your symptoms.  Trouble dealing with emotional problems related to your condition. Summary   Hidradenitis suppurativa is a long-term (chronic) skin disease. It is similar to a severe form of acne, but it affects areas of the body where acne would be unusual.  The first symptoms are usually painful bumps in the skin, similar to pimples. The condition may get worse over time (progress), or it may only cause mild symptoms.  If you have open wounds, cover them with a clean dressing as told by your health care provider. Keep wounds clean by washing them gently with soap and water when you bathe.  Besides skin care, treatment may include medicines, laser treatment, and surgery. This information is not intended to replace advice given to you by your health care provider. Make sure you discuss any questions you have with your health care provider. Document Released: 05/04/2004 Document Revised: 09/28/2017 Document Reviewed: 09/28/2017 Elsevier Patient Education  2020 [employer].

## 2019-05-21 NOTE — Progress Notes (Signed)
Molly Bean is a 58 y.o. female with the following history as recorded in Epic[employer]:  Patient Active Problem List   Diagnosis Date Noted  . Thumb pain, right 11/03/2018  . Essential hypertension 05/01/2018  . Cough 03/24/2018  . SOB (shortness of breath) 03/24/2018  . Asthma 03/14/2018  . Hidradenitis suppurativa 01/18/2018  . Generalized anxiety disorder 10/11/2016  . Chronic shoulder pain 06/09/2016  . MENOPAUSAL SYNDROME 04/23/2009  . OBESITY 02/15/2008  . Depression 04/12/2007  . Gastroesophageal reflux disease 04/12/2007  . UTI'S, CHRONIC 04/12/2007  . EDEMA 04/12/2007    Current Outpatient Medications  Medication Sig Dispense Refill  . acetaminophen (TYLENOL) 500 MG tablet Take 1,000 mg by mouth every 6 (six) hours as needed for mild pain or headache.    . albuterol (VENTOLIN HFA) 108 (90 Base) MCG/ACT inhaler TAKE 2 PUFFS BY MOUTH EVERY 6 HOURS AS NEEDED FOR WHEEZE OR SHORTNESS OF BREATH 8.5 g 5  . amLODipine (NORVASC) 10 MG tablet Take 1 tablet (10 mg total) by mouth daily. 90 tablet 3  . famotidine (PEPCID) 20 MG tablet Take 1 tablet (20 mg total) by mouth at bedtime. 90 tablet 1  . furosemide (LASIX) 20 MG tablet Take 1 tablet (20 mg total) by mouth daily. 90 tablet 3  . polyethylene glycol (MIRALAX / GLYCOLAX) 17 g packet Take 17 g by mouth every other day.    . psyllium (METAMUCIL) 58.6 % packet Take 1 packet by mouth daily.    . sertraline (ZOLOFT) 25 MG tablet Take 1 tablet (25 mg total) by mouth daily. Need appointment to establish with new primary care provider. 90 tablet 0  . triamterene-hydrochlorothiazide (MAXZIDE-25) 37.5-25 MG tablet Take 0.5-1 tablets by mouth daily. 90 tablet 1   No current facility-administered medications for this visit.     Allergies: Pennsaid [diclofenac sodium], Oxycodone, and Shellfish allergy  Past Medical History:  Diagnosis Date  . Anxiety   . DEPRESSION   . Edema   . GERD   . MENOPAUSAL SYNDROME   . OBESITY   . UTI'S,  CHRONIC     Past Surgical History:  Procedure Laterality Date  . ABDOMINAL HYSTERECTOMY     total fibroids  . CESAREAN SECTION      Family History  Problem Relation Age of Onset  . Coronary artery disease Mother   . Hypertension Mother   . Diabetes Mother   . Atrial fibrillation Mother   . Seizures Father   . Lupus Cousin   . Breast cancer Sister 44    Social History   Tobacco Use  . Smoking status: Former Smoker    Packs/day: 0.30    Years: 20.00    Pack years: 6.00    Types: Cigarettes  . Smokeless tobacco: Former Systems developer    Quit date: 10/05/1987  Substance Use Topics  . Alcohol use: No    Subjective:  Presents for TOC- previous PCP has recently left office; 1) History of hypertension- currently taking Amlodipine, Lasix and Triam/ HCTZ; states that her cardiologist wanted her on both diuretics; Denies any chest pain, shortness of breath, blurred vision or headache; not sure when she is supposed to see her cardiologist again; 2) Under care of pulmonology for chronic cough; per patient, she had a sleep study earlier this year and was never contacted about follow-up; 3) Hidradenitis suppurativa- needs name of soap that was discussed at last OV; 4) Anxiety- stable on Zoloft 25 mg daily;    Objective:  Vitals:   05/21/19  1343  BP: 126/78  Pulse: 77  Temp: 98.3 F (36.8 C)  TempSrc: Oral  SpO2: 96%  Weight: 235 lb 6.4 oz (106.8 kg)  Height: _0  (1.575 m)    General: Well developed, well nourished, in no acute distress  Skin : Warm and dry.  Head: Normocephalic and atraumatic  Eyes: Sclera and conjunctiva clear; pupils round and reactive to light; extraocular movements intact  Ears: External normal; canals clear; tympanic membranes normal  Oropharynx: Pink, supple. No suspicious lesions  Neck: Supple without thyromegaly, adenopathy  Lungs: Respirations unlabored; clear to auscultation bilaterally without wheeze, rales, rhonchi  CVS exam: normal rate and regular  rhythm.  Neurologic: Alert and oriented; speech intact; face symmetrical; moves all extremities well; CNII-XII intact without focal deficit   Assessment:  1. Hidradenitis suppurativa   2. Essential hypertension   3. SOB (shortness of breath)   4. Generalized anxiety disorder     Plan:  1. Recommended trial of Hibiclens; patient information provided; 2. Patient is encouraged to reach out to her cardiologist- ? If she is supposed to take both Lasix and Triam/ HCTZ; check CMP, magnesium today; 3. Patient was asked to follow-up with her pulmonologist in April but patient did not receive messages; she is given contact information to schedule follow-up about her sleep study; discussed that recommendations indicated a trial of oxygen at night; 4. Stable on Zoloft 25 mg daily;   Tdap given;   No follow-ups on file.  Orders Placed This Encounter  Procedures  . Tdap vaccine greater than or equal to 7yo IM  . Comp Met (CMET)    Standing Status:   Future    Number of Occurrences:   1    Standing Expiration Date:   05/20/2020  . Magnesium    Standing Status:   Future    Number of Occurrences:   1    Standing Expiration Date:   05/20/2020    Requested Prescriptions    No prescriptions requested or ordered in this encounter

## 2019-05-28 ENCOUNTER — Telehealth: Payer: Self-pay | Admitting: Internal Medicine

## 2019-05-28 NOTE — Telephone Encounter (Signed)
  Pt c/o medication issue:  1. Name of Medication:  furosemide (LASIX) 20 MG tablet amLODipine (NORVASC) 10 MG tablet  2. How are you currently taking this medication (dosage and times per day)? As directed  3. Are you having a reaction (difficulty breathing--STAT)? NO  4. What is your medication issue? Patient's PCP told her to call to make sure she was supposed to be taking both of these medications. PCP told her that since they are both blood pressure medications she should check to make sure.

## 2019-05-28 NOTE — Telephone Encounter (Signed)
Pt called back and she states that her PCP Dr Valere Dross was questioning that pt was taking her medication correctly. She told her to call and verify that she still needed to take the maxide along with the lasix. Informed pt that she was correct to take both and to continue. Pt states that BP is looking "great". She will continue medications as ordered and let PCP know.

## 2019-05-28 NOTE — Telephone Encounter (Signed)
[id number] 

## 2019-09-14 ENCOUNTER — Other Ambulatory Visit: Payer: Self-pay

## 2019-09-14 ENCOUNTER — Ambulatory Visit (INDEPENDENT_AMBULATORY_CARE_PROVIDER_SITE_OTHER): Payer: BC Managed Care – PPO | Admitting: Internal Medicine

## 2019-09-14 ENCOUNTER — Encounter: Payer: Self-pay | Admitting: Internal Medicine

## 2019-09-14 VITALS — BP 110/80 | HR 63 | Temp 98.4°F | Ht 62.0 in | Wt 241.0 lb

## 2019-09-14 DIAGNOSIS — L732 Hidradenitis suppurativa: Secondary | ICD-10-CM | POA: Diagnosis not present

## 2019-09-14 MED ORDER — CHLORHEXIDINE GLUCONATE 4 % EX LIQD: Freq: Every day | CUTANEOUS | 0 refills | Status: DC | PRN

## 2019-09-14 MED ORDER — SULFAMETHOXAZOLE-TRIMETHOPRIM 800-160 MG PO TABS: 1.0000 | ORAL_TABLET | Freq: Two times a day (BID) | ORAL | 0 refills | Status: AC

## 2019-09-14 NOTE — Assessment & Plan Note (Signed)
I and D performed on 2 lesions on the left armpit and 1 lesion right armpit. Given other areas not amenable to drainage rx 10 day bactrim. Rx hibiclens to use for 1 week after healed to help reduce colonization. Talked to her about permanent hair removal to help reduce outbreaks.

## 2019-09-14 NOTE — Patient Instructions (Signed)
We are sending in bactrim to clear the infections. Take 1 pill twice a day for 10 days.   We have sent in a rinse to use as soap in the shower (not on face) daily for 1 week. This will help to reduce the bacteria on the skin.   You can use ice on the areas after today and leave the bandage on until showering next. You may have bleeding within the next day or so.    Incision and Drainage, Care After This sheet gives you information about how to care for yourself after your procedure. Your health care provider may also give you more specific instructions. If you have problems or questions, contact your health care provider. What can I expect after the procedure? After the procedure, it is common to have:  Pain or discomfort around the incision site.  Blood, fluid, or pus (drainage) from the incision.  Redness and firm skin around the incision site. Follow these instructions at home: Medicines  Take over-the-counter and prescription medicines only as told by your health care provider.  If you were prescribed an antibiotic medicine, use or take it as told by your health care provider. Do not stop using the antibiotic even if you start to feel better. Wound care Follow instructions from your health care provider about how to take care of your wound. Make sure you:  Wash your hands with soap and water before and after you change your bandage (dressing). If soap and water are not available, use hand sanitizer.  Change your dressing and packing as told by your health care provider. ? If your dressing is dry or stuck when you try to remove it, moisten or wet the dressing with saline or water so that it can be removed without harming your skin or tissues. ? If your wound is packed, leave it in place until your health care provider tells you to remove it. To remove the packing, moisten or wet the packing with saline or water so that it can be removed without harming your skin or tissues.  Leave  stitches (sutures), skin glue, or adhesive strips in place. These skin closures may need to stay in place for 2 weeks or longer. If adhesive strip edges start to loosen and curl up, you may trim the loose edges. Do not remove adhesive strips completely unless your health care provider tells you to do that. Check your wound every day for signs of infection. Check for:  More redness, swelling, or pain.  More fluid or blood.  Warmth.  Pus or a bad smell. If you were sent home with a drain tube in place, follow instructions from your health care provider about:  How to empty it.  How to care for it at home.  General instructions  Rest the affected area.  Do not take baths, swim, or use a hot tub until your health care provider approves. Ask your health care provider if you may take showers. You may only be allowed to take sponge baths.  Return to your normal activities as told by your health care provider. Ask your health care provider what activities are safe for you. Your health care provider may put you on activity or lifting restrictions.  The incision will continue to drain. It is normal to have some clear or slightly bloody drainage. The amount of drainage should lessen each day.  Do not apply any creams, ointments, or liquids unless you have been told to by your health care  provider.  Keep all follow-up visits as told by your health care provider. This is important. Contact a health care provider if:  Your cyst or abscess returns.  You have a fever or chills.  You have more redness, swelling, or pain around your incision.  You have more fluid or blood coming from your incision.  Your incision feels warm to the touch.  You have pus or a bad smell coming from your incision.  You have red streaks above or below the incision site. Get help right away if:  You have severe pain or bleeding.  You cannot eat or drink without vomiting.  You have decreased urine  output.  You become short of breath.  You have chest pain.  You cough up blood.  The affected area becomes numb or starts to tingle. These symptoms may represent a serious problem that is an emergency. Do not wait to see if the symptoms will go away. Get medical help right away. Call your local emergency services (911 in the [place] Do not drive yourself to the hospital. Summary  After this procedure, it is common to have fluid, blood, or pus coming from the surgery site.  Follow all home care instructions. You will be told how to take care of your incision, how to check for infection, and how to take medicines.  If you were prescribed an antibiotic medicine, take it as told by your health care provider. Do not stop taking the antibiotic even if you start to feel better.  Contact a health care provider if you have increased redness, swelling, or pain around your incision. Get help right away if you have chest pain, you vomit, you cough up blood, or you have shortness of breath.  Keep all follow-up visits as told by your health care provider. This is important. This information is not intended to replace advice given to you by your health care provider. Make sure you discuss any questions you have with your health care provider. Document Released: 12/13/2011 Document Revised: 08/21/2018 Document Reviewed: 08/21/2018 Elsevier Patient Education  2020 [employer].

## 2019-09-14 NOTE — Progress Notes (Signed)
Patient ID: Molly Bean, female   DOB: 1961-09-20, 58 y.o.   MRN: [id number] Incision and Drainage Procedure Note  Pre-operative Diagnosis: left armpit abscesss  Post-operative Diagnosis: same  Indications: pain, swelling  Anesthesia: 1% plain lidocaine  Procedure Details  The procedure, risks and complications have been discussed in detail (including, but not limited to airway compromise, infection, bleeding) with the patient, and the patient has signed consent to the procedure.  The skin was sterilely prepped and draped over the affected area in the usual fashion. After adequate local anesthesia, I&D with a #15 blade was performed on the left axilla. Purulent drainage: present The patient was observed until stable.  Findings: pus  EBL: 1 cc's  Drains: none  Condition: Tolerated procedure well and Stable   Complications: none.

## 2019-09-14 NOTE — Progress Notes (Signed)
Patient ID: Molly Bean, female   DOB: [birth year], 59 y.o.   MRN: [id number] Incision and Drainage Procedure Note  Pre-operative Diagnosis: right armpit abscess  Post-operative Diagnosis: same  Indications: pain, swelling  Anesthesia: 1% plain lidocaine  Procedure Details  The procedure, risks and complications have been discussed in detail (including, but not limited to airway compromise, infection, bleeding) with the patient, and the patient has signed consent to the procedure.  The skin was sterilely prepped and draped over the affected area in the usual fashion. After adequate local anesthesia, I&D with a #15 blade was performed on the right axillary region. Purulent drainage: present The patient was observed until stable.  Findings: pus  EBL: 1 cc's  Drains: none  Condition: Tolerated procedure well and Stable   Complications: none.

## 2019-09-14 NOTE — Progress Notes (Signed)
   Subjective:   Patient ID: Molly Bean, female    DOB: 1961-01-08, 58 y.o.   MRN: [id number]  HPI The patient is a 58 YO female coming in for abscess in right and left ampit region. Started years ago with them coming and going. She normally tries to pop them herself to help them drain. She has several right now which are painful and swollen and red. She wants to get them improved. Cleans well. Denies fevers or chills. Denies nausea or vomiting. Denies rash on the skin. Hurts with moving arms due to the swelling.   Review of Systems  Constitutional: Negative.   HENT: Negative.   Eyes: Negative.   Respiratory: Negative for cough, chest tightness and shortness of breath.   Cardiovascular: Negative for chest pain, palpitations and leg swelling.  Gastrointestinal: Negative for abdominal distention, abdominal pain, constipation, diarrhea, nausea and vomiting.  Musculoskeletal: Positive for myalgias.  Skin: Positive for color change and wound.  Neurological: Negative.   Psychiatric/Behavioral: Negative.     Objective:  Physical Exam Constitutional:      Appearance: She is well-developed. She is obese.  HENT:     Head: Normocephalic and atraumatic.  Cardiovascular:     Rate and Rhythm: Normal rate and regular rhythm.  Pulmonary:     Effort: Pulmonary effort is normal. No respiratory distress.     Breath sounds: Normal breath sounds. No wheezing or rales.  Abdominal:     General: Bowel sounds are normal. There is no distension.     Palpations: Abdomen is soft.     Tenderness: There is no abdominal tenderness. There is no rebound.  Musculoskeletal:        General: Tenderness present.     Cervical back: Normal range of motion.  Skin:    General: Skin is warm and dry.     Comments: Right axilla abscess about 1-2 cm long 13mm wide oval with fluctuance, several smaller hard abscesses on the right not amenable to I and D. Left axilla with multiple abscess including 2 cm by 1 cm oval with  fluctuance, 1 cm by 7 mm oval with fluctuance, several other smaller not amenable to I and D.   Neurological:     Mental Status: She is alert and oriented to person, place, and time.     Coordination: Coordination normal.     Vitals:   09/14/19 1356  BP: 110/80  Pulse: 63  Temp: 98.4 F (36.9 C)  TempSrc: Oral  SpO2: 98%  Weight: 241 lb (109.3 kg)  Height: 5\' 2"  (1.575 m)    This visit occurred during the SARS-CoV-2 public health emergency.  Safety protocols were in place, including screening questions prior to the visit, additional usage of staff PPE, and extensive cleaning of exam room while observing appropriate contact time as indicated for disinfecting solutions.   Assessment & Plan:

## 2019-10-08 ENCOUNTER — Encounter: Payer: Self-pay | Admitting: Family

## 2019-10-08 ENCOUNTER — Ambulatory Visit (INDEPENDENT_AMBULATORY_CARE_PROVIDER_SITE_OTHER): Payer: BC Managed Care – PPO | Admitting: Family

## 2019-10-08 ENCOUNTER — Telehealth: Payer: Self-pay

## 2019-10-08 DIAGNOSIS — J209 Acute bronchitis, unspecified: Secondary | ICD-10-CM

## 2019-10-08 DIAGNOSIS — R6889 Other general symptoms and signs: Secondary | ICD-10-CM

## 2019-10-08 MED ORDER — AZITHROMYCIN 250 MG PO TABS: ORAL_TABLET | ORAL | 0 refills | Status: DC

## 2019-10-08 MED ORDER — HYDROCODONE-HOMATROPINE 5-1.5 MG/5ML PO SYRP: 5.0000 mL | ORAL_SOLUTION | Freq: Three times a day (TID) | ORAL | 0 refills | Status: DC | PRN

## 2019-10-08 NOTE — Progress Notes (Signed)
Molly Bean is a 59 y.o. female with the following history as recorded in [employer]:  Patient Active Problem List   Diagnosis Date Noted  . Thumb pain, right 11/03/2018  . Essential hypertension 05/01/2018  . Cough 03/24/2018  . SOB (shortness of breath) 03/24/2018  . Asthma 03/14/2018  . Hidradenitis suppurativa 01/18/2018  . Generalized anxiety disorder 10/11/2016  . Chronic shoulder pain 06/09/2016  . MENOPAUSAL SYNDROME 04/23/2009  . OBESITY 02/15/2008  . Depression 04/12/2007  . Gastroesophageal reflux disease 04/12/2007  . UTI'S, CHRONIC 04/12/2007  . EDEMA 04/12/2007    Current Outpatient Medications  Medication Sig Dispense Refill  . acetaminophen (TYLENOL) 500 MG tablet Take 1,000 mg by mouth every 6 (six) hours as needed for mild pain or headache.    . albuterol (VENTOLIN HFA) 108 (90 Base) MCG/ACT inhaler TAKE 2 PUFFS BY MOUTH EVERY 6 HOURS AS NEEDED FOR WHEEZE OR SHORTNESS OF BREATH 8.5 g 5  . amLODipine (NORVASC) 10 MG tablet Take 1 tablet (10 mg total) by mouth daily. 90 tablet 3  . azithromycin (ZITHROMAX) 250 MG tablet 2 tabs po qd x 1 day; 1 tablet per day x 4 days; 6 tablet 0  . chlorhexidine (HIBICLENS) 4 % external liquid Apply topically daily as needed. 120 mL 0  . famotidine (PEPCID) 20 MG tablet Take 1 tablet (20 mg total) by mouth at bedtime. 90 tablet 1  . furosemide (LASIX) 20 MG tablet Take 1 tablet (20 mg total) by mouth daily. 90 tablet 3  . HYDROcodone-homatropine (HYCODAN) 5-1.5 MG/5ML syrup Take 5 mLs by mouth every 8 (eight) hours as needed for cough. 120 mL 0  . polyethylene glycol (MIRALAX / GLYCOLAX) 17 g packet Take 17 g by mouth every other day.    . psyllium (METAMUCIL) 58.6 % packet Take 1 packet by mouth daily.    . sertraline (ZOLOFT) 25 MG tablet Take 1 tablet (25 mg total) by mouth daily. Need appointment to establish with new primary care provider. 90 tablet 0  . triamterene-hydrochlorothiazide (MAXZIDE-25) 37.5-25 MG tablet Take 0.5-1  tablets by mouth daily. 90 tablet 1   No current facility-administered medications for this visit.    Allergies: Pennsaid [diclofenac sodium], Oxycodone, and Shellfish allergy  Past Medical History:  Diagnosis Date  . Anxiety   . DEPRESSION   . Edema   . GERD   . MENOPAUSAL SYNDROME   . OBESITY   . UTI'S, CHRONIC     Past Surgical History:  Procedure Laterality Date  . ABDOMINAL HYSTERECTOMY     total fibroids  . CESAREAN SECTION      Family History  Problem Relation Age of Onset  . Coronary artery disease Mother   . Hypertension Mother   . Diabetes Mother   . Atrial fibrillation Mother   . Seizures Father   . Lupus Cousin   . Breast cancer Sister 53    Social History   Tobacco Use  . Smoking status: Former Smoker    Packs/day: 0.30    Years: 20.00    Pack years: 6.00    Types: Cigarettes  . Smokeless tobacco: Former Systems developer    Quit date: 10/05/1987  Substance Use Topics  . Alcohol use: No    Subjective:   I connected with Molly Bean on 10/08/19 at 10:40 AM EST by a video enabled telemedicine application and verified that I am speaking with the correct person using two identifiers.   I discussed the limitations of evaluation and management by telemedicine  and the availability of in person appointments. The patient expressed understanding and agreed to proceed. Provider in office/ patient is at home; provider and patient are only 2 people on video call.   Patient had exposure to COVID over a week ago; had a COVID test done on 12/28 and results were negative as of 4 days ago; still notes that she is "feeling very bad" and feel "like I have been hit by a truck." Temperature is averaging 99-100.4; notes that cough is better but still problematic; no chest pain or shortness of breath; does not take flu shot; husband has same symptoms.     Objective:  There were no vitals filed for this visit.  General: Well developed, well nourished, in no acute distress  Skin :  Warm and dry.  Lungs: Respirations unlabored;  Neurologic: Alert and oriented; speech intact;   Assessment:  1. Acute bronchitis, unspecified organism   2. Flu-like symptoms     Plan:  COVID test was negative but question if patient has influenza; she does not take the flu shot; will go ahead and treat with Z-pak and Hycodan for symptoms; increase fluids, rest and follow-up worse, no better.    No follow-ups on file.  No orders of the defined types were placed in this encounter.   Requested Prescriptions   Signed Prescriptions Disp Refills  . azithromycin (ZITHROMAX) 250 MG tablet 6 tablet 0    Sig: 2 tabs po qd x 1 day; 1 tablet per day x 4 days;  . HYDROcodone-homatropine (HYCODAN) 5-1.5 MG/5ML syrup 120 mL 0    Sig: Take 5 mLs by mouth every 8 (eight) hours as needed for cough.

## 2019-10-08 NOTE — Telephone Encounter (Signed)
Copied from [place] [id number]. Topic: Appointment Scheduling - Scheduling Inquiry for Clinic >> Oct 08, 2019  8:44 AM Alanda Slim E wrote: Reason for CRM: Pt would like to schedule a virtual visit with Jodi Mourning asap. Pt was exposed to covid and is not feeling well / Pt was exposed last Monday and took a covid test on Wednesday(2 days after exposure.) the results were negative but the Pt still has a fever of 100.4, chills, vomiting, weakness, headache and is not feeling well/ please advise   F/U   Virtual visit today with Jodi Mourning.

## 2019-11-09 ENCOUNTER — Ambulatory Visit (INDEPENDENT_AMBULATORY_CARE_PROVIDER_SITE_OTHER): Payer: BC Managed Care – PPO | Admitting: Family

## 2019-11-09 DIAGNOSIS — Z20822 Contact with and (suspected) exposure to covid-19: Secondary | ICD-10-CM

## 2019-11-09 DIAGNOSIS — R0602 Shortness of breath: Secondary | ICD-10-CM

## 2019-11-09 NOTE — Progress Notes (Signed)
Molly Bean is a 59 y.o. female with the following history as recorded in Epic[facility]:  Patient Active Problem List   Diagnosis Date Noted  . Thumb pain, right 11/03/2018  . Essential hypertension 05/01/2018  . Cough 03/24/2018  . SOB (shortness of breath) 03/24/2018  . Asthma 03/14/2018  . Hidradenitis suppurativa 01/18/2018  . Generalized anxiety disorder 10/11/2016  . Chronic shoulder pain 06/09/2016  . MENOPAUSAL SYNDROME 04/23/2009  . OBESITY 02/15/2008  . Depression 04/12/2007  . Gastroesophageal reflux disease 04/12/2007  . UTI'S, CHRONIC 04/12/2007  . EDEMA 04/12/2007    Current Outpatient Medications  Medication Sig Dispense Refill  . acetaminophen (TYLENOL) 500 MG tablet Take 1,000 mg by mouth every 6 (six) hours as needed for mild pain or headache.    . albuterol (VENTOLIN HFA) 108 (90 Base) MCG/ACT inhaler TAKE 2 PUFFS BY MOUTH EVERY 6 HOURS AS NEEDED FOR WHEEZE OR SHORTNESS OF BREATH 8.5 g 5  . amLODipine (NORVASC) 10 MG tablet Take 1 tablet (10 mg total) by mouth daily. 90 tablet 3  . azithromycin (ZITHROMAX) 250 MG tablet 2 tabs po qd x 1 day; 1 tablet per day x 4 days; 6 tablet 0  . chlorhexidine (HIBICLENS) 4 % external liquid Apply topically daily as needed. 120 mL 0  . famotidine (PEPCID) 20 MG tablet Take 1 tablet (20 mg total) by mouth at bedtime. 90 tablet 1  . furosemide (LASIX) 20 MG tablet Take 1 tablet (20 mg total) by mouth daily. 90 tablet 3  . HYDROcodone-homatropine (HYCODAN) 5-1.5 MG/5ML syrup Take 5 mLs by mouth every 8 (eight) hours as needed for cough. 120 mL 0  . polyethylene glycol (MIRALAX / GLYCOLAX) 17 g packet Take 17 g by mouth every other day.    . psyllium (METAMUCIL) 58.6 % packet Take 1 packet by mouth daily.    . sertraline (ZOLOFT) 25 MG tablet Take 1 tablet (25 mg total) by mouth daily. Need appointment to establish with new primary care provider. 90 tablet 0  . triamterene-hydrochlorothiazide (MAXZIDE-25) 37.5-25 MG tablet Take 0.5-1  tablets by mouth daily. 90 tablet 1   No current facility-administered medications for this visit.    Allergies: Pennsaid [diclofenac sodium], Oxycodone, and Shellfish allergy  Past Medical History:  Diagnosis Date  . Anxiety   . DEPRESSION   . Edema   . GERD   . MENOPAUSAL SYNDROME   . OBESITY   . UTI'S, CHRONIC     Past Surgical History:  Procedure Laterality Date  . ABDOMINAL HYSTERECTOMY     total fibroids  . CESAREAN SECTION      Family History  Problem Relation Age of Onset  . Coronary artery disease Mother   . Hypertension Mother   . Diabetes Mother   . Atrial fibrillation Mother   . Seizures Father   . Lupus Cousin   . Breast cancer Sister 35    Social History   Tobacco Use  . Smoking status: Former Smoker    Packs/day: 0.30    Years: 20.00    Pack years: 6.00    Types: Cigarettes  . Smokeless tobacco: Former Systems developer    Quit date: 10/05/1987  Substance Use Topics  . Alcohol use: No    Subjective:   I connected with Molly Bean on 11/09/19 at  3:20 PM EST by a video enabled telemedicine application and verified that I am speaking with the correct person using two identifiers.   I discussed the limitations of evaluation and management by  telemedicine and the availability of in person appointments. The patient expressed understanding and agreed to proceed. Provider in office/ patient is at home; provider and patient are only 2 people on video call.   Patient had a virtual visit on 1/4 and was treated for acute bronchitis because she had had a negative COVID test the week prior. Patient is now suspicious that she did in fact have COVID and her test results were a false negative. Her husband was sick at the same time and tested positive at the [place]- he was actually hospitalized at [facility] from 1/6-1/10. Patient's husband was then asked to quarantine at home from 1/10-1/27 and patient opted to follow the same schedule. She is concerned about "long term" after  effects of COVID due to persisting extreme fatigue. She feels that she just does not have her normal stamina. She gets easily winded with activity; she denies any chest pain on exertion however.     Objective:  There were no vitals filed for this visit.  General: Well developed, well nourished, in no acute distress  Head: Normocephalic and atraumatic  Lungs: Respirations unlabored;  Neurologic: Alert and oriented; speech intact; face symmetrical;   Assessment:  1. Shortness of breath   2. Contact with and (suspected) exposure to covid-19     Plan:  Patient will come to the office on Monday for labs and CXR; may also need to refer to cardiology- follow-up to be determined. She is more than 21 days out from date of her husband' diagnosis so she can safely come to the office for in person care.   No follow-ups on file.  Orders Placed This Encounter  Procedures  . DG Chest 2 View    Standing Status:   Future    Standing Expiration Date:   01/06/2021    Order Specific Question:   Reason for Exam (SYMPTOM  OR DIAGNOSIS REQUIRED)    Answer:   shortness of breath    Order Specific Question:   Is patient pregnant?    Answer:   No    Order Specific Question:   Preferred imaging location?    Answer:   [facility]    Order Specific Question:   Radiology Contrast Protocol - do NOT remove file path    Answer:   [email or web address]  . CBC w/Diff    Standing Status:   Future    Standing Expiration Date:   11/08/2020  . Comp Met (CMET)    Standing Status:   Future    Standing Expiration Date:   11/08/2020  . D-Dimer, Quantitative    Standing Status:   Future    Standing Expiration Date:   11/08/2020    Requested Prescriptions    No prescriptions requested or ordered in this encounter

## 2019-11-12 ENCOUNTER — Other Ambulatory Visit (INDEPENDENT_AMBULATORY_CARE_PROVIDER_SITE_OTHER): Payer: BC Managed Care – PPO

## 2019-11-12 ENCOUNTER — Ambulatory Visit (INDEPENDENT_AMBULATORY_CARE_PROVIDER_SITE_OTHER): Payer: BC Managed Care – PPO

## 2019-11-12 ENCOUNTER — Other Ambulatory Visit: Payer: Self-pay

## 2019-11-12 DIAGNOSIS — R0602 Shortness of breath: Secondary | ICD-10-CM

## 2019-11-12 LAB — COMPREHENSIVE METABOLIC PANEL
ALT: 19 U/L (ref 0–35)
AST: 17 U/L (ref 0–37)
Albumin: 3.9 g/dL (ref 3.5–5.2)
Alkaline Phosphatase: 68 U/L (ref 39–117)
BUN: 12 mg/dL (ref 6–23)
CO2: 28 mEq/L (ref 19–32)
Calcium: 9.1 mg/dL (ref 8.4–10.5)
Chloride: 106 mEq/L (ref 96–112)
Creatinine, Ser: 0.79 mg/dL (ref 0.40–1.20)
GFR: 89.67 mL/min (ref >60.00)
Glucose, Bld: 95 mg/dL (ref 70–99)
Potassium: 3.9 mEq/L (ref 3.5–5.1)
Sodium: 140 mEq/L (ref 135–145)
Total Bilirubin: 0.4 mg/dL (ref 0.2–1.2)
Total Protein: 6.9 g/dL (ref 6.0–8.3)

## 2019-11-12 LAB — CBC WITH DIFFERENTIAL/PLATELET
Basophils Absolute: 0 10*3/uL (ref 0.0–0.1)
Basophils Relative: 0.4 % (ref 0.0–3.0)
Eosinophils Absolute: 0.1 10*3/uL (ref 0.0–0.7)
Eosinophils Relative: 1.8 % (ref 0.0–5.0)
HCT: 38 % (ref 36.0–46.0)
Hemoglobin: 12 g/dL (ref 12.0–15.0)
Lymphocytes Relative: 39.1 % (ref 12.0–46.0)
Lymphs Abs: 2.2 10*3/uL (ref 0.7–4.0)
MCHC: 31.7 g/dL (ref 30.0–36.0)
MCV: 66.2 fl — ABNORMAL LOW (ref 78.0–100.0)
Monocytes Absolute: 0.4 10*3/uL (ref 0.1–1.0)
Monocytes Relative: 6.1 % (ref 3.0–12.0)
Neutro Abs: 3 10*3/uL (ref 1.4–7.7)
Neutrophils Relative %: 52.6 % (ref 43.0–77.0)
Platelets: 196 10*3/uL (ref 150.0–400.0)
RBC: 5.75 Mil/uL — ABNORMAL HIGH (ref 3.87–5.11)
RDW: 18.4 % — ABNORMAL HIGH (ref 11.5–15.5)
WBC: 5.7 10*3/uL (ref 4.0–10.5)

## 2019-11-13 ENCOUNTER — Other Ambulatory Visit: Payer: Self-pay | Admitting: Family

## 2019-11-13 DIAGNOSIS — R0602 Shortness of breath: Secondary | ICD-10-CM

## 2019-11-13 LAB — D-DIMER, QUANTITATIVE: D-Dimer, Quant: 0.6 mcg/mL FEU — ABNORMAL HIGH (ref <0.50)

## 2019-11-14 ENCOUNTER — Ambulatory Visit (INDEPENDENT_AMBULATORY_CARE_PROVIDER_SITE_OTHER)
Admission: RE | Admit: 2019-11-14 | Discharge: 2019-11-14 | Disposition: A | Discharge status: Home / Self Care | Payer: BC Managed Care – PPO | Source: Ambulatory Visit | Attending: Family | Admitting: Family

## 2019-11-14 ENCOUNTER — Other Ambulatory Visit: Payer: Self-pay

## 2019-11-14 DIAGNOSIS — R0602 Shortness of breath: Secondary | ICD-10-CM | POA: Diagnosis not present

## 2019-11-14 MED ORDER — IOHEXOL 350 MG/ML SOLN
80.0000 mL | Freq: Once | INTRAVENOUS | Status: AC | PRN
  Administered 2019-11-14: 15:00:00 80 mL via INTRAVENOUS

## 2019-11-15 ENCOUNTER — Encounter: Payer: Self-pay | Admitting: Family

## 2019-11-16 ENCOUNTER — Other Ambulatory Visit: Payer: Self-pay | Admitting: Family

## 2019-11-16 DIAGNOSIS — I517 Cardiomegaly: Secondary | ICD-10-CM

## 2019-11-16 DIAGNOSIS — R590 Localized enlarged lymph nodes: Secondary | ICD-10-CM

## 2019-11-16 DIAGNOSIS — R0609 Other forms of dyspnea: Secondary | ICD-10-CM

## 2019-11-16 DIAGNOSIS — R9389 Abnormal findings on diagnostic imaging of other specified body structures: Secondary | ICD-10-CM

## 2019-11-17 ENCOUNTER — Other Ambulatory Visit: Payer: Self-pay | Admitting: Pulmonary Disease

## 2019-11-17 DIAGNOSIS — G4734 Idiopathic sleep related nonobstructive alveolar hypoventilation: Secondary | ICD-10-CM

## 2019-11-21 ENCOUNTER — Other Ambulatory Visit: Payer: Self-pay

## 2019-11-21 ENCOUNTER — Ambulatory Visit (INDEPENDENT_AMBULATORY_CARE_PROVIDER_SITE_OTHER)
Admission: RE | Admit: 2019-11-21 | Discharge: 2019-11-21 | Disposition: A | Discharge status: Home / Self Care | Payer: BC Managed Care – PPO | Source: Ambulatory Visit | Attending: Family | Admitting: Family

## 2019-11-21 DIAGNOSIS — R590 Localized enlarged lymph nodes: Secondary | ICD-10-CM | POA: Diagnosis not present

## 2019-11-21 MED ORDER — IOHEXOL 300 MG/ML  SOLN
80.0000 mL | Freq: Once | INTRAMUSCULAR | Status: AC | PRN
  Administered 2019-11-21: 80 mL via INTRAVENOUS

## 2019-11-23 ENCOUNTER — Other Ambulatory Visit: Payer: Self-pay | Admitting: Family

## 2019-11-23 MED ORDER — AMOXICILLIN-POT CLAVULANATE 875-125 MG PO TABS: 1.0000 | ORAL_TABLET | Freq: Two times a day (BID) | ORAL | 0 refills | Status: AC

## 2019-11-26 ENCOUNTER — Ambulatory Visit (INDEPENDENT_AMBULATORY_CARE_PROVIDER_SITE_OTHER): Payer: BC Managed Care – PPO | Admitting: Pulmonary Disease

## 2019-11-26 ENCOUNTER — Other Ambulatory Visit: Payer: Self-pay

## 2019-11-26 ENCOUNTER — Encounter: Payer: Self-pay | Admitting: Pulmonary Disease

## 2019-11-26 VITALS — BP 126/78 | HR 62 | Temp 97.4°F | Ht 62.0 in | Wt 234.6 lb

## 2019-11-26 DIAGNOSIS — R0600 Dyspnea, unspecified: Secondary | ICD-10-CM

## 2019-11-26 DIAGNOSIS — G4733 Obstructive sleep apnea (adult) (pediatric): Secondary | ICD-10-CM

## 2019-11-26 NOTE — Patient Instructions (Signed)
We will check to see whether we can use the old sleep study as basis for prescription for CPAP  If not we will get a home sleep study  Echocardiogram to assess cardiac function  Regular exercises as we discussed today  I will see you in about 4 weeks  Call with any significant concerns  Attempt to stop Lasix if he can

## 2019-11-26 NOTE — Progress Notes (Signed)
Molly Bean    [id number]    April 13, 1961  Primary Care Physician:Murray, Marvis Repress, [place]  Referring Physician: Marrian Salvage, [place],  [place] [address]  Chief complaint:   Excessive daytime sleepiness, significant snoring Persistence of symptoms  HPI: Had overnight polysomnogram performed in 2020 February, this did reveal mild obstructive sleep apnea Was not started on CPAP at the time She continues to have symptoms of fatigue, nonrestorative sleep Snoring Coughing at night  Weight gain recently Usually goes to bed between 930 and 10:30 PM, falls asleep in 15 to 20 minutes Has been told on multiple occasions that she falls asleep faster than anyone  Wakes up with a dry mouth, frequent headaches, difficulty with concentration, memory issues  Never smoker   Outpatient Encounter Medications as of 11/26/2019  Medication Sig  . acetaminophen (TYLENOL) 500 MG tablet Take 1,000 mg by mouth every 6 (six) hours as needed for mild pain or headache.  . albuterol (VENTOLIN HFA) 108 (90 Base) MCG/ACT inhaler TAKE 2 PUFFS BY MOUTH EVERY 6 HOURS AS NEEDED FOR WHEEZE OR SHORTNESS OF BREATH  . amoxicillin-clavulanate (AUGMENTIN) 875-125 MG tablet Take 1 tablet by mouth 2 (two) times daily for 10 days.  . chlorhexidine (HIBICLENS) 4 % external liquid Apply topically daily as needed.  . famotidine (PEPCID) 20 MG tablet Take 1 tablet (20 mg total) by mouth at bedtime.  Marland Kitchen HYDROcodone-homatropine (HYCODAN) 5-1.5 MG/5ML syrup Take 5 mLs by mouth every 8 (eight) hours as needed for cough.  . polyethylene glycol (MIRALAX / GLYCOLAX) 17 g packet Take 17 g by mouth every other day.  . psyllium (METAMUCIL) 58.6 % packet Take 1 packet by mouth daily.  . sertraline (ZOLOFT) 25 MG tablet Take 1 tablet (25 mg total) by mouth daily. Need appointment to establish with new primary care provider.  . triamterene-hydrochlorothiazide (MAXZIDE-25) 37.5-25 MG tablet  Take 0.5-1 tablets by mouth daily.  Marland Kitchen amLODipine (NORVASC) 10 MG tablet Take 1 tablet (10 mg total) by mouth daily.  . furosemide (LASIX) 20 MG tablet Take 1 tablet (20 mg total) by mouth daily.   No facility-administered encounter medications on file as of 11/26/2019.    Allergies as of 11/26/2019 - Review Complete 11/26/2019  Allergen Reaction Noted  . Pennsaid [diclofenac sodium] Rash 06/21/2016  . Oxycodone  03/09/2014  . Shellfish allergy  09/23/2014    Past Medical History:  Diagnosis Date  . Anxiety   . DEPRESSION   . Edema   . GERD   . MENOPAUSAL SYNDROME   . OBESITY   . UTI'S, CHRONIC     Past Surgical History:  Procedure Laterality Date  . ABDOMINAL HYSTERECTOMY     total fibroids  . CESAREAN SECTION      Family History  Problem Relation Age of Onset  . Coronary artery disease Mother   . Hypertension Mother   . Diabetes Mother   . Atrial fibrillation Mother   . Seizures Father   . Lupus Cousin   . Breast cancer Sister 66    Social History   Socioeconomic History  . Marital status: Married    Spouse name: Not on file  . Number of children: Not on file  . Years of education: Not on file  . Highest education level: Not on file  Occupational History  . Not on file  Tobacco Use  . Smoking status: Former Smoker    Packs/day: 0.30  Years: 20.00    Pack years: 6.00    Types: Cigarettes    Quit date: 10/04/1977    Years since quitting: 42.1  . Smokeless tobacco: Former Systems developer    Quit date: 10/05/1987  Substance and Sexual Activity  . Alcohol use: No  . Drug use: No  . Sexual activity: Not on file  Other Topics Concern  . Not on file  Social History Narrative  . Not on file   Social Determinants of Health   Financial Resource Strain:   . Difficulty of Paying Living Expenses: Not on file  Food Insecurity:   . Worried About Charity fundraiser in the Last Year: Not on file  . Ran Out of Food in the Last Year: Not on file  Transportation Needs:     . Lack of Transportation (Medical): Not on file  . Lack of Transportation (Non-Medical): Not on file  Physical Activity:   . Days of Exercise per Week: Not on file  . Minutes of Exercise per Session: Not on file  Stress:   . Feeling of Stress : Not on file  Social Connections:   . Frequency of Communication with Friends and Family: Not on file  . Frequency of Social Gatherings with Friends and Family: Not on file  . Attends Religious Services: Not on file  . Active Member of Clubs or Organizations: Not on file  . Attends Archivist Meetings: Not on file  . Marital Status: Not on file  Intimate Partner Violence:   . Fear of Current or Ex-Partner: Not on file  . Emotionally Abused: Not on file  . Physically Abused: Not on file  . Sexually Abused: Not on file    Review of Systems  Constitutional: Negative.   HENT: Negative.   Eyes: Negative.   Respiratory: Positive for apnea.   Cardiovascular: Positive for leg swelling.  Endocrine: Negative.   Genitourinary: Negative.   Musculoskeletal: Negative.   Skin: Negative.   Psychiatric/Behavioral: Positive for sleep disturbance.    Vitals:   11/26/19 1630  BP: 126/78  Pulse: 62  Temp: (!) 97.4 F (36.3 C)  SpO2: 100%     Physical Exam  Constitutional: She appears well-developed and well-nourished.  HENT:  Head: Normocephalic and atraumatic.  Crowded oropharynx, Mallampati 4  Eyes: Pupils are equal, round, and reactive to light. Conjunctivae and EOM are normal.  Neck: No tracheal deviation present. No thyromegaly present.  Cardiovascular: Normal rate and regular rhythm. Exam reveals no friction rub.  No murmur heard. Pulmonary/Chest: Effort normal and breath sounds normal. No respiratory distress. She has no wheezes.  Musculoskeletal:     Cervical back: Normal range of motion and neck supple.   Assessment:   Mild obstructive sleep apnea  -Significant daytime sleepiness   History of depression -Treatment  optimized  Obesity -Recent weight gain  Dyspnea on exertion  Nonrestorative sleep  Plan/Recommendations:  Initiation of CPAP therapy if old study from 2020 can be used otherwise a home sleep study may be needed  Pathophysiology of sleep disordered breathing discussed  Options of treatment for sleep disordered breathing discussed  Importance of graded exercises/regular exercises discussed to try and keep her weight in check This will also help sleep quality  Coughing at night may be multifactorial including GERD, currently on Pepcid  Borderline heart size on recent CT with presence of shortness of breath on exertion-we will obtain an echocardiogram to assess right pulmonary pressures  Sherrilyn Rist MD [facility] 11/26/2019, 5:07 PM  CC: Marrian Salvage,*

## 2019-12-10 ENCOUNTER — Ambulatory Visit (HOSPITAL_COMMUNITY): Payer: BC Managed Care – PPO | Attending: Pulmonary Disease

## 2019-12-10 ENCOUNTER — Other Ambulatory Visit: Payer: Self-pay

## 2019-12-10 DIAGNOSIS — R0600 Dyspnea, unspecified: Secondary | ICD-10-CM | POA: Insufficient documentation

## 2019-12-17 ENCOUNTER — Encounter: Payer: Self-pay | Admitting: Internal Medicine

## 2019-12-17 ENCOUNTER — Ambulatory Visit (INDEPENDENT_AMBULATORY_CARE_PROVIDER_SITE_OTHER): Payer: BC Managed Care – PPO | Admitting: Internal Medicine

## 2019-12-17 ENCOUNTER — Other Ambulatory Visit: Payer: Self-pay

## 2019-12-17 VITALS — BP 110/80 | HR 52 | Temp 97.2°F | Ht 62.0 in | Wt 239.0 lb

## 2019-12-17 DIAGNOSIS — R0602 Shortness of breath: Secondary | ICD-10-CM | POA: Diagnosis not present

## 2019-12-17 DIAGNOSIS — I10 Essential (primary) hypertension: Secondary | ICD-10-CM

## 2019-12-17 DIAGNOSIS — R059 Cough, unspecified: Secondary | ICD-10-CM

## 2019-12-17 DIAGNOSIS — R600 Localized edema: Secondary | ICD-10-CM | POA: Diagnosis not present

## 2019-12-17 DIAGNOSIS — R05 Cough: Secondary | ICD-10-CM

## 2019-12-17 DIAGNOSIS — R002 Palpitations: Secondary | ICD-10-CM

## 2019-12-17 DIAGNOSIS — R0683 Snoring: Secondary | ICD-10-CM

## 2019-12-17 NOTE — Patient Instructions (Signed)
Medication Instructions:  No changes *If you need a refill on your cardiac medications before your next appointment, please call your pharmacy*   Lab Work: Not needed.   Testing/Procedures: Not needed   Follow-Up: At [facility], you and your health needs are our priority.  As part of our continuing mission to provide you with exceptional heart care, we have created designated Provider Care Teams.  These Care Teams include your primary Cardiologist (physician) and Advanced Practice Providers (APPs -  Physician Assistants and Nurse Practitioners) who all work together to provide you with the care you need, when you need it.  We recommend signing up for the patient portal called "MyChart".  Sign up information is provided on this After Visit Summary.  MyChart is used to connect with patients for Virtual Visits (Telemedicine).  Patients are able to view lab/test results, encounter notes, upcoming appointments, etc.  Non-urgent messages can be sent to your provider as well.   To learn more about what you can do with MyChart, go to [email or web address].    Your next appointment:   3 month(s)  The format for your next appointment:   In Person  Provider:   Cherlynn Kaiser, MD   Other Instructions  purchase some compression socks or hose  Mild to moderate  8- 20 mmHG . Wear during the day an take off at night. You may purchase at medial supply , local drug store, uniform store , department store

## 2019-12-17 NOTE — Progress Notes (Signed)
Cardiology Office Note:    Date:  12/17/2019   ID:  Molly Bean, DOB 01-Jan-1961, MRN [id number]  PCP:  Molly Bean, [place]  Cardiologist:  Molly Munroe, MD  Electrophysiologist:  None   Referring MD: Molly Bean,*   Chief Complaint: follow up shortness of breath  History of Present Illness:    Molly Bean is a 59 y.o. female with a history of hx of anxiety and GERD, with significant life stressors who presents for follow-up of shortness of breath and query pulmonary hypertension.  Her overall life stressors have improved, however she became ill with Covid in December 2020 as did her husband.  He has remained short of breath and feels she is having a protracted recovery.  She had Dr. Ander Bean in pulmonary medicine specifically for snoring and daytime sleepiness, and is being evaluated for sleep apnea.  Polysomnography in February 2020 revealed mild sleep apnea, however CPAP was not initiated at that time.  Recommendation for CPAP was made and this is being coordinated through their office.  They also discussed her coughing at night which may be related to GERD.  A CT scan was obtained due to acute onset shortness of breath and elevated D-dimer in February 2021, and no pulmonary embolism was detected.  She did have mild central bronchial wall thickening indicative of reactive airway disease, enlarged right submental lymph node, borderline to mild cardiomegaly with mild reflux of contrast into the intrahepatic IVC indicative of elevated right heart pressure or tricuspid regurgitation.  For these findings an echocardiogram was repeated and we reviewed results today.  This study demonstrated an ejection fraction of 60 to 65% with normal diastolic parameters, unable to measure pulmonary artery systolic pressure due to inadequate tricuspid regurgitation signal, normal-appearing right ventricle, and no significant valvular heart disease.  We reviewed these results and  comparison to CT scan.  She denies any chest discomfort  denies syncope.  She does describe lower extremity swelling.  Past Medical History:  Diagnosis Date  . Anxiety   . DEPRESSION   . Edema   . GERD   . MENOPAUSAL SYNDROME   . OBESITY   . UTI'S, CHRONIC     Past Surgical History:  Procedure Laterality Date  . ABDOMINAL HYSTERECTOMY     total fibroids  . CESAREAN SECTION      Current Medications: Current Meds  Medication Sig  . acetaminophen (TYLENOL) 500 MG tablet Take 1,000 mg by mouth every 6 (six) hours as needed for mild pain or headache.  . albuterol (VENTOLIN HFA) 108 (90 Base) MCG/ACT inhaler TAKE 2 PUFFS BY MOUTH EVERY 6 HOURS AS NEEDED FOR WHEEZE OR SHORTNESS OF BREATH  . chlorhexidine (HIBICLENS) 4 % external liquid Apply topically daily as needed.  . famotidine (PEPCID) 20 MG tablet Take 1 tablet (20 mg total) by mouth at bedtime.  Molly Bean HYDROcodone-homatropine (HYCODAN) 5-1.5 MG/5ML syrup Take 5 mLs by mouth every 8 (eight) hours as needed for cough.  . polyethylene glycol (MIRALAX / GLYCOLAX) 17 g packet Take 17 g by mouth every other day.  . psyllium (METAMUCIL) 58.6 % packet Take 1 packet by mouth daily.  . sertraline (ZOLOFT) 25 MG tablet Take 1 tablet (25 mg total) by mouth daily. Need appointment to establish with new primary care provider.  . triamterene-hydrochlorothiazide (MAXZIDE-25) 37.5-25 MG tablet Take 0.5-1 tablets by mouth daily.     Allergies:   Pennsaid [diclofenac sodium], Oxycodone, and Shellfish allergy   Social History   Socioeconomic History  .  Marital status: Married    Spouse name: Not on file  . Number of children: Not on file  . Years of education: Not on file  . Highest education level: Not on file  Occupational History  . Not on file  Tobacco Use  . Smoking status: Former Smoker    Packs/day: 0.30    Years: 20.00    Pack years: 6.00    Types: Cigarettes    Quit date: 10/04/1977    Years since quitting: 42.2  . Smokeless  tobacco: Former Systems developer    Quit date: 10/05/1987  Substance and Sexual Activity  . Alcohol use: No  . Drug use: No  . Sexual activity: Not on file  Other Topics Concern  . Not on file  Social History Narrative  . Not on file   Social Determinants of Health   Financial Resource Strain:   . Difficulty of Paying Living Expenses:   Food Insecurity:   . Worried About Charity fundraiser in the Last Year:   . Arboriculturist in the Last Year:   Transportation Needs:   . Film/video editor (Medical):   Molly Bean Lack of Transportation (Non-Medical):   Physical Activity:   . Days of Exercise per Week:   . Minutes of Exercise per Session:   Stress:   . Feeling of Stress :   Social Connections:   . Frequency of Communication with Friends and Family:   . Frequency of Social Gatherings with Friends and Family:   . Attends Religious Services:   . Active Member of Clubs or Organizations:   . Attends Archivist Meetings:   Molly Bean Marital Status:      Family History: The patient's family history includes Atrial fibrillation in her mother; Breast cancer (age of onset: 8) in her sister; Coronary artery disease in her mother; Diabetes in her mother; Hypertension in her mother; Lupus in her cousin; Seizures in her father.  ROS:   Please see the history of present illness.    All other systems reviewed and are negative.  EKGs/Labs/Other Studies Reviewed:    The following studies were reviewed today:  EKG:  Sinus bradycardia, rate 52  I have independently reviewed the images from CTA chest 11/14/2019.  Recent Labs: 05/21/2019: Magnesium 2.0 11/12/2019: ALT 19; BUN 12; Creatinine, Ser 0.79; Hemoglobin 12.0; Platelets 196.0; Potassium 3.9; Sodium 140  Recent Lipid Panel    Component Value Date/Time   CHOL 172 11/06/2010 1638   TRIG 104.0 11/06/2010 1638   HDL 53.70 11/06/2010 1638   CHOLHDL 3 11/06/2010 1638   VLDL 20.8 11/06/2010 1638   LDLCALC 98 11/06/2010 1638    Physical Exam:     VS:  BP 110/80   Pulse (!) 52   Temp (!) 97.2 F (36.2 C)   Ht 5\' 2"  (1.575 m)   Wt 239 lb (108.4 kg)   SpO2 98%   BMI 43.71 kg/m     Wt Readings from Last 5 Encounters:  11/26/19 234 lb 9.6 oz (106.4 kg)  09/14/19 241 lb (109.3 kg)  05/21/19 235 lb 6.4 oz (106.8 kg)  04/23/19 235 lb (106.6 kg)  04/19/19 235 lb (106.6 kg)     Constitutional: No acute distress Eyes: sclera non-icteric, normal conjunctiva and lids ENMT: normal dentition, moist mucous membranes Cardiovascular: regular rhythm, normal rate, no murmurs. S1 and S2 normal. Radial pulses normal bilaterally. No jugular venous distention.  Respiratory: clear to auscultation bilaterally GI : normal bowel sounds, soft  and nontender. No distention.   MSK: extremities warm, well perfused.  Diffuse lower extremity edema.  NEURO: grossly nonfocal exam, moves all extremities. PSYCH: alert and oriented x 3, normal mood and affect.   ASSESSMENT:    1. Pedal edema   2. Essential hypertension   3. Palpitations   4. SOB (shortness of breath)   5. Snoring   6. Cough    PLAN:    Pedal edema -she currently takes triamterene HCTZ.  I have offered alternate diuretic therapy given her well-controlled blood pressure, however she would like to observe at this time.  I have recommended compression stockings given that her echocardiogram does not show significant systolic or diastolic dysfunction, and there may be a component of venous insufficiency. Plan: Compression stockings  Essential hypertension -blood pressure well controlled, continue current therapy.  Palpitations -EKG obtained today shows sinus bradycardia and no concerning findings.Holter monitor from our visit in February 2020 demonstrated infrequent ectopy and no atrial fibrillation.  The patient and I have discussed this today and we will continue to observe per her preference.  We have discussed low-dose beta-blockade, however she is baseline bradycardic.  SOB  (shortness of breath)-likely contributed to by protracted recovery from COVID-19 infection.  Patient would like to continue to observe the symptoms.  Snoring Cough -Recommend CPAP and further evaluation with pulmonary medicine, this is being coordinated through their office.  I have offered her assistance through our sleep medicine program if needed.   Total time of encounter: 30 minutes total time of encounter, including 25 minutes spent in face-to-face patient care. This time includes coordination of care and counseling regarding above mentioned problem list. Remainder of non-face-to-face time involved reviewing chart documents/testing relevant to the patient encounter and documentation in the medical record. I have independently reviewed documentation from referring provider.   Cherlynn Kaiser, MD [place]  [facility]    Medication Adjustments/Labs and Tests Ordered: Current medicines are reviewed at length with the patient today.  Concerns regarding medicines are outlined above.  Orders Placed This Encounter  Procedures  . Compression stockings  . EKG 12-Lead   No orders of the defined types were placed in this encounter.   Patient Instructions  Medication Instructions:  No changes *If you need a refill on your cardiac medications before your next appointment, please call your pharmacy*   Lab Work: Not needed.   Testing/Procedures: Not needed   Follow-Up: At [facility], you and your health needs are our priority.  As part of our continuing mission to provide you with exceptional heart care, we have created designated Provider Care Teams.  These Care Teams include your primary Cardiologist (physician) and Advanced Practice Providers (APPs -  Physician Assistants and Nurse Practitioners) who all work together to provide you with the care you need, when you need it.  We recommend signing up for the patient portal called "MyChart".  Sign up information is provided  on this After Visit Summary.  MyChart is used to connect with patients for Virtual Visits (Telemedicine).  Patients are able to view lab/test results, encounter notes, upcoming appointments, etc.  Non-urgent messages can be sent to your provider as well.   To learn more about what you can do with MyChart, go to [email or web address].    Your next appointment:   3 month(s)  The format for your next appointment:   In Person  Provider:   Cherlynn Kaiser, MD   Other Instructions  purchase some compression socks or hose  Mild  to moderate  8- 20 mmHG . Wear during the day an take off at night. You may purchase at medial supply , local drug store, uniform store , department store

## 2019-12-24 ENCOUNTER — Encounter: Payer: Self-pay | Admitting: Family

## 2019-12-31 ENCOUNTER — Other Ambulatory Visit: Payer: Self-pay | Admitting: Internal Medicine

## 2019-12-31 DIAGNOSIS — I10 Essential (primary) hypertension: Secondary | ICD-10-CM

## 2020-03-04 ENCOUNTER — Other Ambulatory Visit: Payer: Self-pay | Admitting: Internal Medicine

## 2020-03-14 ENCOUNTER — Ambulatory Visit (INDEPENDENT_AMBULATORY_CARE_PROVIDER_SITE_OTHER): Payer: BC Managed Care – PPO | Admitting: Internal Medicine

## 2020-03-14 ENCOUNTER — Other Ambulatory Visit: Payer: Self-pay

## 2020-03-14 ENCOUNTER — Encounter: Payer: Self-pay | Admitting: Internal Medicine

## 2020-03-14 VITALS — BP 138/64 | HR 76 | Ht 62.0 in | Wt 242.2 lb

## 2020-03-14 DIAGNOSIS — Z6841 Body Mass Index (BMI) 40.0 and over, adult: Secondary | ICD-10-CM

## 2020-03-14 DIAGNOSIS — R600 Localized edema: Secondary | ICD-10-CM | POA: Diagnosis not present

## 2020-03-14 DIAGNOSIS — R0602 Shortness of breath: Secondary | ICD-10-CM | POA: Diagnosis not present

## 2020-03-14 DIAGNOSIS — I10 Essential (primary) hypertension: Secondary | ICD-10-CM

## 2020-03-14 DIAGNOSIS — F419 Anxiety disorder, unspecified: Secondary | ICD-10-CM

## 2020-03-14 DIAGNOSIS — R0683 Snoring: Secondary | ICD-10-CM

## 2020-03-14 NOTE — Progress Notes (Signed)
Cardiology Office Note:    Date:  03/14/2020   ID:  [name], DOB 04-29-1961, MRN [id number]  PCP:  Marrian Salvage, [place]  Cardiologist:  Elouise Munroe, MD  Electrophysiologist:  None   Referring MD: Marrian Salvage,*   Chief Complaint: f/u SOB  History of Present Illness:    Molly Bean is a 59 y.o. female with a history of anxiety and GERD, who presents for follow-up of shortness of breath.  She was diagnosed with sleep apnea and was initiated on CPAP. Her shortness of breath has resolved with treatment of OSA. She wakes up refreshed, gets 8 hours of sleep, and is in excellent spirits in light of this. We discussed mechanism of treated sleep apnea in overall cardiovascular health.  She is surprised that her weight is increasing and has tried dietary factors. We discussed diet and lifestyle modification as well as exercise recommendations. Discussed referral to healthy weight and wellness center.  She continues to have LE swelling, but notes it is mostly in her knee joint and ankles. She is already on a diuretic with triamterene HCTZ.  She denies chest pain, chest pressure, dyspnea at rest or with exertion, palpitations, PND, orthopnea. Denies cough, fever, chills. Denies nausea, vomiting. Denies syncope or presyncope. Denies dizziness or lightheadedness. Past Medical History:  Diagnosis Date  . Anxiety   . DEPRESSION   . Edema   . GERD   . MENOPAUSAL SYNDROME   . OBESITY   . UTI'S, CHRONIC     Past Surgical History:  Procedure Laterality Date  . ABDOMINAL HYSTERECTOMY     total fibroids  . CESAREAN SECTION      Current Medications: Current Meds  Medication Sig  . acetaminophen (TYLENOL) 500 MG tablet Take 1,000 mg by mouth every 6 (six) hours as needed for mild pain or headache.  . albuterol (VENTOLIN HFA) 108 (90 Base) MCG/ACT inhaler TAKE 2 PUFFS BY MOUTH EVERY 6 HOURS AS NEEDED FOR WHEEZE OR SHORTNESS OF BREATH  . amLODipine (NORVASC)  10 MG tablet Take 1 tablet (10 mg total) by mouth daily.  . chlorhexidine (HIBICLENS) 4 % external liquid Apply topically daily as needed.  . famotidine (PEPCID) 20 MG tablet TAKE 1 TABLET BY MOUTH EVERYDAY AT BEDTIME  . HYDROcodone-homatropine (HYCODAN) 5-1.5 MG/5ML syrup Take 5 mLs by mouth every 8 (eight) hours as needed for cough.  . polyethylene glycol (MIRALAX / GLYCOLAX) 17 g packet Take 17 g by mouth every other day.  . psyllium (METAMUCIL) 58.6 % packet Take 1 packet by mouth daily.  . sertraline (ZOLOFT) 25 MG tablet Take 1 tablet (25 mg total) by mouth daily. Need appointment to establish with new primary care provider.  . triamterene-hydrochlorothiazide (MAXZIDE-25) 37.5-25 MG tablet TAKE 0.5-1 TABLET BY MOUTH DAILY.     Allergies:   Pennsaid [diclofenac sodium], Oxycodone, and Shellfish allergy   Social History   Socioeconomic History  . Marital status: Married    Spouse name: Not on file  . Number of children: Not on file  . Years of education: Not on file  . Highest education level: Not on file  Occupational History  . Not on file  Tobacco Use  . Smoking status: Former Smoker    Packs/day: 0.30    Years: 20.00    Pack years: 6.00    Types: Cigarettes    Quit date: 10/04/1977    Years since quitting: 42.4  . Smokeless tobacco: Former Systems developer    Quit date: 10/05/1987  Vaping Use  . Vaping Use: Never used  Substance and Sexual Activity  . Alcohol use: No  . Drug use: No  . Sexual activity: Not on file  Other Topics Concern  . Not on file  Social History Narrative  . Not on file   Social Determinants of Health   Financial Resource Strain:   . Difficulty of Paying Living Expenses:   Food Insecurity:   . Worried About Charity fundraiser in the Last Year:   . Arboriculturist in the Last Year:   Transportation Needs:   . Film/video editor (Medical):   Marland Kitchen Lack of Transportation (Non-Medical):   Physical Activity:   . Days of Exercise per Week:   . Minutes of  Exercise per Session:   Stress:   . Feeling of Stress :   Social Connections:   . Frequency of Communication with Friends and Family:   . Frequency of Social Gatherings with Friends and Family:   . Attends Religious Services:   . Active Member of Clubs or Organizations:   . Attends Archivist Meetings:   Marland Kitchen Marital Status:      Family History: The patient's family history includes Atrial fibrillation in her mother; Breast cancer (age of onset: 78) in her sister; Coronary artery disease in her mother; Diabetes in her mother; Hypertension in her mother; Lupus in her cousin; Seizures in her father.  ROS:   Please see the history of present illness.    All other systems reviewed and are negative.  EKGs/Labs/Other Studies Reviewed:    The following studies were reviewed today:  EKG:  n/a  Recent Labs: 05/21/2019: Magnesium 2.0 11/12/2019: ALT 19; BUN 12; Creatinine, Ser 0.79; Hemoglobin 12.0; Platelets 196.0; Potassium 3.9; Sodium 140  Recent Lipid Panel    Component Value Date/Time   CHOL 172 11/06/2010 1638   TRIG 104.0 11/06/2010 1638   HDL 53.70 11/06/2010 1638   CHOLHDL 3 11/06/2010 1638   VLDL 20.8 11/06/2010 1638   LDLCALC 98 11/06/2010 1638    Physical Exam:    VS:  BP 138/64   Pulse 76   Ht 5\' 2"  (1.575 m)   Wt 242 lb 3.2 oz (109.9 kg)   SpO2 96%   BMI 44.30 kg/m     Wt Readings from Last 5 Encounters:  03/14/20 242 lb 3.2 oz (109.9 kg)  12/17/19 239 lb (108.4 kg)  11/26/19 234 lb 9.6 oz (106.4 kg)  09/14/19 241 lb (109.3 kg)  05/21/19 235 lb 6.4 oz (106.8 kg)    Constitutional: No acute distress Eyes: sclera non-icteric, normal conjunctiva and lids ENMT: normal dentition, moist mucous membranes Cardiovascular: regular rhythm, normal rate, no murmurs. S1 and S2 normal. Radial pulses normal bilaterally. No jugular venous distention.  Respiratory: clear to auscultation bilaterally GI : normal bowel sounds, soft and nontender. No distention.   MSK:  extremities warm, well perfused. Trace pedal edema.  NEURO: grossly nonfocal exam, moves all extremities. PSYCH: alert and oriented x 3, normal mood and affect.   ASSESSMENT:    1. Class 3 severe obesity due to excess calories with serious comorbidity and body mass index (BMI) of 40.0 to 44.9 in adult (HCC)   2. Pedal edema   3. Essential hypertension   4. SOB (shortness of breath)   5. Snoring   6. Anxiety    PLAN:    Class 3 severe obesity due to excess calories with serious comorbidity and body mass index (BMI)  of 40.0 to 44.9 in adult [facility]) - Plan: Amb Ref to Medical Weight Management  Patient is interested in structured weight loss program. Will refer to Healthy Weight and Wellness center for further guidance. We discussed Mediterranean diet and exercise recommendations noted below.  Pedal edema - she has been taking an additional 1/2 tab of Maxzide when she has LE swelling, self directed. This is reasonable and she can continue this plan for now.  Essential hypertension - continue current therapy.   SOB (shortness of breath) - resolved with treatment of OSA.   Snoring - CPAP, doing extremely well.  Anxiety - improved.  Total time of encounter: 30 minutes total time of encounter, including 21 minutes spent in face-to-face patient care on the date of this encounter. This time includes coordination of care and counseling regarding above mentioned problem list. Remainder of non-face-to-face time involved reviewing chart documents/testing relevant to the patient encounter and documentation in the medical record. I have independently reviewed documentation from referring provider.   Cherlynn Kaiser, MD [facility]    Medication Adjustments/Labs and Tests Ordered: Current medicines are reviewed at length with the patient today.  Concerns regarding medicines are outlined above.  Orders Placed This Encounter  Procedures  . Amb Ref to Medical Weight Management    No orders of the defined types were placed in this encounter.   Patient Instructions  Medication Instructions:  No changes  You may take an additional 1/2 tablet of trima/hctz ( Maxzide)   if need for  Increase swelling *If you need a refill on your cardiac medications before your next appointment, please call your pharmacy*   Lab Work: Not needed If you have labs (blood work) drawn today and your tests are completely normal, you will receive your results only by: Marland Kitchen MyChart Message (if you have MyChart) OR . A paper copy in the mail If you have any lab test that is abnormal or we need to change your treatment, we will call you to review the results.   Testing/Procedures: none  Follow-Up: At [facility], you and your health needs are our priority.  As part of our continuing mission to provide you with exceptional heart care, we have created designated Provider Care Teams.  These Care Teams include your primary Cardiologist (physician) and Advanced Practice Providers (APPs -  Physician Assistants and Nurse Practitioners) who all work together to provide you with the care you need, when you need it.  We recommend signing up for the patient portal called "MyChart".  Sign up information is provided on this After Visit Summary.  MyChart is used to connect with patients for Virtual Visits (Telemedicine).  Patients are able to view lab/test results, encounter notes, upcoming appointments, etc.  Non-urgent messages can be sent to your provider as well.   To learn more about what you can do with MyChart, go to [email or web address].    Your next appointment:   6 month(s)  The format for your next appointment:   In Person  Provider:   Cherlynn Kaiser, MD   Other Instructions You have been referred to  Health and wellness - for weight loss

## 2020-03-14 NOTE — Patient Instructions (Addendum)
Medication Instructions:  No changes  You may take an additional 1/2 tablet of trima/hctz ( Maxzide)   if need for  Increase swelling *If you need a refill on your cardiac medications before your next appointment, please call your pharmacy*   Lab Work: Not needed If you have labs (blood work) drawn today and your tests are completely normal, you will receive your results only by: Marland Kitchen MyChart Message (if you have MyChart) OR . A paper copy in the mail If you have any lab test that is abnormal or we need to change your treatment, we will call you to review the results.   Testing/Procedures: none  Follow-Up: At [facility], you and your health needs are our priority.  As part of our continuing mission to provide you with exceptional heart care, we have created designated Provider Care Teams.  These Care Teams include your primary Cardiologist (physician) and Advanced Practice Providers (APPs -  Physician Assistants and Nurse Practitioners) who all work together to provide you with the care you need, when you need it.  We recommend signing up for the patient portal called "MyChart".  Sign up information is provided on this After Visit Summary.  MyChart is used to connect with patients for Virtual Visits (Telemedicine).  Patients are able to view lab/test results, encounter notes, upcoming appointments, etc.  Non-urgent messages can be sent to your provider as well.   To learn more about what you can do with MyChart, go to [email or web address].    Your next appointment:   6 month(s)  The format for your next appointment:   In Person  Provider:   Cherlynn Kaiser, MD   Other Instructions You have been referred to  Health and wellness - for weight loss

## 2020-04-09 ENCOUNTER — Ambulatory Visit (INDEPENDENT_AMBULATORY_CARE_PROVIDER_SITE_OTHER): Payer: BC Managed Care – PPO | Admitting: Internal Medicine

## 2020-04-09 ENCOUNTER — Encounter: Payer: Self-pay | Admitting: Internal Medicine

## 2020-04-09 ENCOUNTER — Other Ambulatory Visit: Payer: Self-pay

## 2020-04-09 DIAGNOSIS — N760 Acute vaginitis: Secondary | ICD-10-CM | POA: Insufficient documentation

## 2020-04-09 DIAGNOSIS — I10 Essential (primary) hypertension: Secondary | ICD-10-CM | POA: Diagnosis not present

## 2020-04-09 DIAGNOSIS — R300 Dysuria: Secondary | ICD-10-CM | POA: Diagnosis not present

## 2020-04-09 MED ORDER — FLUCONAZOLE 150 MG PO TABS: ORAL_TABLET | ORAL | 1 refills | Status: DC

## 2020-04-09 NOTE — Assessment & Plan Note (Signed)
Very mild slight, for urine studies, but suspect more likely vaginitis related

## 2020-04-09 NOTE — Patient Instructions (Signed)
Please take all new medication as prescribed - the antibiotic (fluconozole) for yeast infection  Please continue all other medications as before, and refills have been done if requested.  Please have the pharmacy call with any other refills you may need.  Please keep your appointments with your specialists as you may have planned  Please go to the LAB at the blood drawing area for the tests to be done - just the urine testing today  You will be contacted by phone if any changes need to be made immediately.  Otherwise, you will receive a letter about your results with an explanation, but please check with MyChart first.  Please remember to sign up for MyChart if you have not done so, as this will be important to you in the future with finding out test results, communicating by private email, and scheduling acute appointments online when needed.

## 2020-04-09 NOTE — Assessment & Plan Note (Signed)
Overall stable overall by history and exam, recent data reviewed with pt, and pt to continue medical treatment as before,  to f/u any worsening symptoms or concerns  

## 2020-04-09 NOTE — Progress Notes (Signed)
Subjective:    Patient ID: Molly Bean, female    DOB: 1961-07-10, 59 y.o.   MRN: [id number]  HPI  Here to f/u with c/o 3 days onset mild vaginal erythema irritaiton and whitish d/c verified by husband, wondering if c/w yeast infection. seemed to start after used petroleum jelly instead of kY jellyl for intercourse recently.  D/C verified per husband.   Has some irriation and slight discomfort on urination, but o/w Denies urinary symptoms such as dysuria, frequency, urgency, flank pain, hematuria or n/v, fever, chills.  Pt s/p TAH BSO.  Pt denies chest pain, increased sob or doe, wheezing, orthopnea, PND, increased LE swelling, palpitations, dizziness or syncope.  No prior hx of HTN and on edge today seeing a different provider.  Pt denies new neurological symptoms such as new headache, or facial or extremity weakness or numbness   Pt denies polydipsia, polyuria,  BP Readings from Last 3 Encounters:  03/14/20 138/64  12/17/19 110/80  11/26/19 126/78   Past Medical History:  Diagnosis Date   Anxiety    DEPRESSION    Edema    GERD    MENOPAUSAL SYNDROME    OBESITY    UTI'S, CHRONIC    Past Surgical History:  Procedure Laterality Date   ABDOMINAL HYSTERECTOMY     total fibroids   CESAREAN SECTION      reports that she quit smoking about 42 years ago. Her smoking use included cigarettes. She has a 6.00 pack-year smoking history. She quit smokeless tobacco use about 32 years ago. She reports that she does not drink alcohol and does not use drugs. family history includes Atrial fibrillation in her mother; Breast cancer (age of onset: 1) in her sister; Coronary artery disease in her mother; Diabetes in her mother; Hypertension in her mother; Lupus in her cousin; Seizures in her father. Allergies  Allergen Reactions   Pennsaid [Diclofenac Sodium] Rash   Oxycodone     Hallucinations   Shellfish Allergy    Current Outpatient Medications on File Prior to Visit  Medication  Sig Dispense Refill   acetaminophen (TYLENOL) 500 MG tablet Take 1,000 mg by mouth every 6 (six) hours as needed for mild pain or headache.     albuterol (VENTOLIN HFA) 108 (90 Base) MCG/ACT inhaler TAKE 2 PUFFS BY MOUTH EVERY 6 HOURS AS NEEDED FOR WHEEZE OR SHORTNESS OF BREATH 8.5 g 5   amLODipine (NORVASC) 10 MG tablet Take 1 tablet (10 mg total) by mouth daily. 90 tablet 3   chlorhexidine (HIBICLENS) 4 % external liquid Apply topically daily as needed. 120 mL 0   famotidine (PEPCID) 20 MG tablet TAKE 1 TABLET BY MOUTH EVERYDAY AT BEDTIME 90 tablet 0   HYDROcodone-homatropine (HYCODAN) 5-1.5 MG/5ML syrup Take 5 mLs by mouth every 8 (eight) hours as needed for cough. 120 mL 0   polyethylene glycol (MIRALAX / GLYCOLAX) 17 g packet Take 17 g by mouth every other day.     psyllium (METAMUCIL) 58.6 % packet Take 1 packet by mouth daily.     sertraline (ZOLOFT) 25 MG tablet Take 1 tablet (25 mg total) by mouth daily. Need appointment to establish with new primary care provider. 90 tablet 0   triamterene-hydrochlorothiazide (MAXZIDE-25) 37.5-25 MG tablet TAKE 0.5-1 TABLET BY MOUTH DAILY. 90 tablet 1   No current facility-administered medications on file prior to visit.   Review of Systems All otherwise neg per pt     Objective:   Physical Exam There were no vitals  taken for this visit. VS noted,  Constitutional: Pt appears in NAD HENT: Head: NCAT.  Right Ear: External ear normal.  Left Ear: External ear normal.  Eyes: . Pupils are equal, round, and reactive to light. Conjunctivae and EOM are normal Nose: without d/c or deformity Neck: Neck supple. Gross normal ROM Cardiovascular: Normal rate and regular rhythm.   Pulmonary/Chest: Effort normal and breath sounds without rales or wheezing.  Abd:  Soft, NT, ND, + BS, no organomegaly Neurological: Pt is alert. At baseline orientation, motor grossly intact Skin: Skin is warm. No rashes, other new lesions, no LE edema Psychiatric: Pt  behavior is normal without agitation  All otherwise neg per pt Lab Results  Component Value Date   WBC 5.7 11/12/2019   HGB 12.0 11/12/2019   HCT 38.0 11/12/2019   PLT 196.0 11/12/2019   GLUCOSE 95 11/12/2019   CHOL 172 11/06/2010   TRIG 104.0 11/06/2010   HDL 53.70 11/06/2010   LDLCALC 98 11/06/2010   ALT 19 11/12/2019   AST 17 11/12/2019   NA 140 11/12/2019   K 3.9 11/12/2019   CL 106 11/12/2019   CREATININE 0.79 11/12/2019   BUN 12 11/12/2019   CO2 28 11/12/2019   TSH 1.64 04/28/2018   INR 1.00 09/25/2016      Assessment & Plan:

## 2020-04-09 NOTE — Assessment & Plan Note (Addendum)
Mild to mod, for diflucan asd, to f/u any worsening symptoms or concerns  I spent 31 minutes in preparing to see the patient by review of recent labs, imaging and procedures, obtaining and reviewing separately obtained history, communicating with the patient and family or caregiver, ordering medications, tests or procedures, and documenting clinical information in the EHR including the differential Dx, treatment, and any further evaluation and other management of vaginitis, dysuria, htn

## 2020-04-10 LAB — URINALYSIS, ROUTINE W REFLEX MICROSCOPIC
Bilirubin Urine: NEGATIVE
Glucose, UA: NEGATIVE
Hgb urine dipstick: NEGATIVE
Ketones, ur: NEGATIVE
Leukocytes,Ua: NEGATIVE
Nitrite: NEGATIVE
Protein, ur: NEGATIVE
Specific Gravity, Urine: 1.009 (ref 1.001–1.03)
pH: <=5 (ref 5.0–8.0)

## 2020-04-10 LAB — URINE CULTURE

## 2020-04-11 ENCOUNTER — Encounter: Payer: Self-pay | Admitting: Internal Medicine

## 2020-04-15 ENCOUNTER — Encounter (INDEPENDENT_AMBULATORY_CARE_PROVIDER_SITE_OTHER): Payer: Self-pay | Admitting: Family Medicine

## 2020-04-15 ENCOUNTER — Other Ambulatory Visit: Payer: Self-pay

## 2020-04-15 ENCOUNTER — Ambulatory Visit (INDEPENDENT_AMBULATORY_CARE_PROVIDER_SITE_OTHER): Payer: BC Managed Care – PPO | Admitting: Family Medicine

## 2020-04-15 VITALS — BP 127/61 | HR 54 | Temp 98.9°F | Ht 62.0 in | Wt 240.0 lb

## 2020-04-15 DIAGNOSIS — F3289 Other specified depressive episodes: Secondary | ICD-10-CM

## 2020-04-15 DIAGNOSIS — G4733 Obstructive sleep apnea (adult) (pediatric): Secondary | ICD-10-CM

## 2020-04-15 DIAGNOSIS — Z0289 Encounter for other administrative examinations: Secondary | ICD-10-CM

## 2020-04-15 DIAGNOSIS — I10 Essential (primary) hypertension: Secondary | ICD-10-CM

## 2020-04-15 DIAGNOSIS — R5383 Other fatigue: Secondary | ICD-10-CM

## 2020-04-15 DIAGNOSIS — R0602 Shortness of breath: Secondary | ICD-10-CM | POA: Diagnosis not present

## 2020-04-15 DIAGNOSIS — Z9189 Other specified personal risk factors, not elsewhere classified: Secondary | ICD-10-CM | POA: Diagnosis not present

## 2020-04-15 DIAGNOSIS — Z6841 Body Mass Index (BMI) 40.0 and over, adult: Secondary | ICD-10-CM

## 2020-04-15 DIAGNOSIS — Z9989 Dependence on other enabling machines and devices: Secondary | ICD-10-CM

## 2020-04-15 NOTE — Progress Notes (Signed)
Dear Dr. Nadean Corwin,   Thank you for referring Molly Bean to our clinic. The following note includes my evaluation and treatment recommendations.    Chief Complaint:   OBESITY Molly Bean (MR# [id number]) is a 59 y.o. female who presents for evaluation and treatment of obesity and related comorbidities. Current BMI is Body mass index is 43.9 kg/m. Molly Bean has been struggling with her weight for many years and has been unsuccessful in either losing weight, maintaining weight loss, or reaching her healthy weight goal.  Molly Bean is currently in the action stage of change and ready to dedicate time achieving and maintaining a healthier weight. Molly Bean is interested in becoming our patient and working on intensive lifestyle modifications including (but not limited to) diet and exercise for weight loss.  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  She is married.  Her husband, Molly Bean, is supportive.  Molly Bean's habits were reviewed today and are as follows: Her family eats meals together, she thinks her family will eat healthier with her, her desired weight loss is 52 pounds, she started gaining weight after she had a hysterectomy, her heaviest weight ever was 242 pounds, she skips meals frequently, she is frequently drinking liquids with calories, she frequently eats larger portions than normal and she struggles with emotional eating.  Depression Screen Molly Bean Food and Mood (modified PHQ-9) score was 19.  Depression screen [facility] 2/9 04/15/2020  Decreased Interest 3  Down, Depressed, Hopeless 3  PHQ - 2 Score 6  Altered sleeping 3  Tired, decreased energy 3  Change in appetite 2  Feeling bad or failure about yourself  2  Trouble concentrating 0  Moving slowly or fidgety/restless 3  Suicidal thoughts 0  PHQ-9 Score 19  Difficult doing work/chores Somewhat difficult   Subjective:   1. Other fatigue Molly Bean admits to daytime somnolence and  denies waking up still tired. Patent has a history of symptoms of daytime fatigue, morning headache and snoring. Molly Bean generally gets 6 hours of sleep per night, and states that she has generally restful sleep. Snoring is present. Apneic episodes are present.  She says she has been extremely tired/fatigued.  Symptoms have improved some with CPAP/OSA diagnosis.  Epworth Sleepiness Score is 14.  2. SOB (shortness of breath) on exertion Molly Bean notes increasing shortness of breath with exercising and seems to be worsening over time with weight gain. She notes getting out of breath sooner with activity than she used to. This has gotten worse recently. Molly Bean denies shortness of breath at rest or orthopnea.  3. Essential hypertension Review: taking medications as instructed, no medication side effects noted, no chest pain on exertion, no dyspnea on exertion, no swelling of ankles.  She is on Maxzide only (recently taken off amlodipine).  Blood pressure at home is 120s/70s.  BP Readings from Last 3 Encounters:  04/15/20 127/61  03/14/20 138/64  12/17/19 110/80   4. OSA on CPAP Molly Bean has a diagnosis of sleep apnea. She reports that she is using a CPAP regularly.  She has been on it for 2 months.  5. Other depression, emotional eating  Molly Bean is struggling with emotional eating and using food for comfort to the extent that it is negatively impacting her health. She has been working on behavior modification techniques to help reduce her emotional eating and has been unsuccessful. She shows no sign of suicidal or homicidal ideations.  She also has anxiety.  She has been on Zoloft for about  a year now.  PHQ-9 is 19.  6. At risk for complication associated with hypotension Molly Bean is at risk for hypotension.  Assessment/Plan:   1. Other fatigue Molly Bean does feel that her weight is causing her energy to be lower than it should be. Fatigue may be related to obesity, depression or many other  causes. Labs will be ordered, and in the meanwhile, Molly Bean will focus on self care including making healthy food choices, increasing physical activity and focusing on stress reduction.  - EKG 12-Lead - Hemoglobin A1c - Insulin, random - Lipid Panel With LDL/HDL Ratio - VITAMIN D 25 Hydroxy (Vit-D Deficiency, Fractures) - Vitamin B12 - Folate - T3 - T4, free - TSH  2. SOB (shortness of breath) on exertion Molly Bean does feel that she gets out of breath more easily that she used to when she exercises. Molly Bean's shortness of breath appears to be obesity related and exercise induced. She has agreed to work on weight loss and gradually increase exercise to treat her exercise induced shortness of breath. Will continue to monitor closely. - Hemoglobin A1c - Insulin, random - Lipid Panel With LDL/HDL Ratio - VITAMIN D 25 Hydroxy (Vit-D Deficiency, Fractures) - Vitamin B12 - Folate - T3 - T4, free - TSH  3. Essential hypertension Molly Bean is working on healthy weight loss and exercise to improve blood pressure control. We will watch for signs of hypotension as she continues her lifestyle modifications. - Comprehensive metabolic panel - CBC with Differential/Platelet - Hemoglobin A1c - Insulin, random - Lipid Panel With LDL/HDL Ratio  4. OSA on CPAP Intensive lifestyle modifications are the first line treatment for this issue. We discussed several lifestyle modifications today and she will continue to work on diet, exercise and weight loss efforts. We will continue to monitor. Orders and follow up as documented in patient record.   Counseling  Sleep apnea is a condition in which breathing pauses or becomes shallow during sleep. This happens over and over during the night. This disrupts your sleep and keeps your body from getting the rest that it needs, which can cause tiredness and lack of energy (fatigue) during the day.  Sleep apnea treatment: If you were given a device to open your  airway while you sleep, USE IT!  Sleep hygiene:   Limit or avoid alcohol, caffeinated beverages, and cigarettes, especially close to bedtime.   Do not eat a large meal or eat spicy foods right before bedtime. This can lead to digestive discomfort that can make it hard for you to sleep.  Keep a sleep diary to help you and your health care provider figure out what could be causing your insomnia.  . Make your bedroom a dark, comfortable place where it is easy to fall asleep. ? Put up shades or blackout curtains to block light from outside. ? Use a white noise machine to block noise. ? Keep the temperature cool. . Limit screen use before bedtime. This includes: ? Watching TV. ? Using your smartphone, tablet, or computer. . Stick to a routine that includes going to bed and waking up at the same times every day and night. This can help you fall asleep faster. Consider making a quiet activity, such as reading, part of your nighttime routine. . Try to avoid taking naps during the day so that you sleep better at night. . Get out of bed if you are still awake after 15 minutes of trying to sleep. Keep the lights down, but try reading or doing  a quiet activity. When you feel sleepy, go back to bed.  5. Other depression, emotional eating  Patient was referred to Dr. Mallie Mussel, our Bariatric Psychologist, for evaluation due to her elevated PHQ-9 score and significant struggles with emotional eating.  6. At risk for complication associated with hypotension Nakeya was given approximately 15 minutes of education and counseling today to help avoid hypotension. We discussed risks of hypotension with weight loss and signs of hypotension such as feeling lightheaded or unsteady.  Repetitive spaced learning was employed today to elicit superior memory formation and behavioral change.  7. Class 3 severe obesity with serious comorbidity and body mass index (BMI) of 40.0 to 44.9 in adult, unspecified obesity type  (HCC) Molly Bean is currently in the action stage of change and her goal is to continue with weight loss efforts. I recommend Molly Bean begin the structured treatment plan as follows:  She has agreed to the Category 1 Plan.  Exercise goals: As is.   Behavioral modification strategies: increasing lean protein intake, increasing water intake, travel eating strategies and planning for success.  She was informed of the importance of frequent follow-up visits to maximize her success with intensive lifestyle modifications for her multiple health conditions. She was informed we would discuss her lab results at her next visit unless there is a critical issue that needs to be addressed sooner. Molly Bean agreed to keep her next visit at the agreed upon time to discuss these results.  Objective:   Blood pressure 127/61, pulse (!) 54, temperature 98.9 F (37.2 C), temperature source Oral, height 5\' 2"  (1.575 m), weight 240 lb (108.9 kg), SpO2 98 %. Body mass index is 43.9 kg/m.  EKG: Normal sinus rhythm, rate 52 bpm.  Indirect Calorimeter completed today shows a VO2 of 173 and a REE of 1202.  Her calculated basal metabolic rate is 5009 thus her basal metabolic rate is worse than expected.  General: Cooperative, alert, well developed, in no acute distress. HEENT: Conjunctivae and lids unremarkable. Cardiovascular: Regular rhythm.  Lungs: Normal work of breathing. Neurologic: No focal deficits.   Lab Results  Component Value Date   CREATININE 0.72 04/15/2020   BUN 8 04/15/2020   NA WILL FOLLOW 04/15/2020   K WILL FOLLOW 04/15/2020   CL WILL FOLLOW 04/15/2020   CO2 24 04/15/2020   Lab Results  Component Value Date   ALT 14 04/15/2020   AST 14 04/15/2020   ALKPHOS 83 04/15/2020   BILITOT 0.3 04/15/2020   Lab Results  Component Value Date   TSH 1.100 04/15/2020   Lab Results  Component Value Date   CHOL 182 04/15/2020   HDL 58 04/15/2020   LDLCALC 107 (H) 04/15/2020   TRIG 91  04/15/2020   CHOLHDL 3 11/06/2010   Lab Results  Component Value Date   WBC 6.9 04/15/2020   HGB 12.1 04/15/2020   HCT 40.3 04/15/2020   MCV 70 (L) 04/15/2020   PLT 225 04/15/2020   Lab Results  Component Value Date   IRON 58 10/27/2018   Attestation Statements:   Reviewed by clinician on day of visit: allergies, medications, problem list, medical history, surgical history, family history, social history, and previous encounter notes.  I, Water quality scientist, CMA, am acting as Location manager for [employer], DO.  I have reviewed the above documentation for accuracy and completeness, and I agree with the above. Mellody Dance, DO

## 2020-04-16 LAB — LIPID PANEL WITH LDL/HDL RATIO
Cholesterol, Total: 182 mg/dL (ref 100–199)
HDL: 58 mg/dL (ref >39)
LDL Chol Calc (NIH): 107 mg/dL — ABNORMAL HIGH (ref 0–99)
LDL/HDL Ratio: 1.8 ratio (ref 0.0–3.2)
Triglycerides: 91 mg/dL (ref 0–149)
VLDL Cholesterol Cal: 17 mg/dL (ref 5–40)

## 2020-04-16 LAB — COMPREHENSIVE METABOLIC PANEL
ALT: 14 IU/L (ref 0–32)
AST: 14 IU/L (ref 0–40)
Albumin/Globulin Ratio: 1.7 (ref 1.2–2.2)
Albumin: 4.3 g/dL (ref 3.8–4.9)
Alkaline Phosphatase: 83 IU/L (ref 48–121)
BUN/Creatinine Ratio: 11 — ABNORMAL LOW (ref 12–28)
BUN: 8 mg/dL (ref 8–27)
Bilirubin Total: 0.3 mg/dL (ref 0.0–1.2)
CO2: 24 mmol/L (ref 20–29)
Calcium: 9.5 mg/dL (ref 8.7–10.3)
Chloride: 103 mmol/L (ref 96–106)
Creatinine, Ser: 0.72 mg/dL (ref 0.57–1.00)
GFR calc Af Amer: 105 mL/min/{1.73_m2} (ref >59)
GFR calc non Af Amer: 91 mL/min/{1.73_m2} (ref >59)
Globulin, Total: 2.5 g/dL (ref 1.5–4.5)
Glucose: 89 mg/dL (ref 65–99)
Potassium: 4.3 mmol/L (ref 3.5–5.2)
Sodium: 143 mmol/L (ref 134–144)
Total Protein: 6.8 g/dL (ref 6.0–8.5)

## 2020-04-16 LAB — CBC WITH DIFFERENTIAL/PLATELET
Basophils Absolute: 0 10*3/uL (ref 0.0–0.2)
Basos: 0 %
EOS (ABSOLUTE): 0.1 10*3/uL (ref 0.0–0.4)
Eos: 1 %
Hematocrit: 40.3 % (ref 34.0–46.6)
Hemoglobin: 12.1 g/dL (ref 11.1–15.9)
Immature Grans (Abs): 0 10*3/uL (ref 0.0–0.1)
Immature Granulocytes: 0 %
Lymphocytes Absolute: 2.5 10*3/uL (ref 0.7–3.1)
Lymphs: 36 %
MCH: 21.1 pg — ABNORMAL LOW (ref 26.6–33.0)
MCHC: 30 g/dL — ABNORMAL LOW (ref 31.5–35.7)
MCV: 70 fL — ABNORMAL LOW (ref 79–97)
Monocytes Absolute: 0.3 10*3/uL (ref 0.1–0.9)
Monocytes: 4 %
Neutrophils Absolute: 4 10*3/uL (ref 1.4–7.0)
Neutrophils: 59 %
Platelets: 225 10*3/uL (ref 150–450)
RBC: 5.74 x10E6/uL — ABNORMAL HIGH (ref 3.77–5.28)
RDW: 18.7 % — ABNORMAL HIGH (ref 11.7–15.4)
WBC: 6.9 10*3/uL (ref 3.4–10.8)

## 2020-04-16 LAB — T4, FREE: Free T4: 0.96 ng/dL (ref 0.82–1.77)

## 2020-04-16 LAB — TSH: TSH: 1.1 u[IU]/mL (ref 0.450–4.500)

## 2020-04-16 LAB — FOLATE: Folate: 13.1 ng/mL (ref >3.0)

## 2020-04-16 LAB — INSULIN, RANDOM: INSULIN: 21.9 u[IU]/mL (ref 2.6–24.9)

## 2020-04-16 LAB — HEMOGLOBIN A1C
Est. average glucose Bld gHb Est-mCnc: 128 mg/dL
Hgb A1c MFr Bld: 6.1 % — ABNORMAL HIGH (ref 4.8–5.6)

## 2020-04-16 LAB — VITAMIN B12: Vitamin B-12: 638 pg/mL (ref 232–1245)

## 2020-04-16 LAB — T3: T3, Total: 138 ng/dL (ref 71–180)

## 2020-04-16 LAB — VITAMIN D 25 HYDROXY (VIT D DEFICIENCY, FRACTURES): Vit D, 25-Hydroxy: 21.9 ng/mL — ABNORMAL LOW (ref 30.0–100.0)
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
## 2020-04-29 ENCOUNTER — Encounter (INDEPENDENT_AMBULATORY_CARE_PROVIDER_SITE_OTHER): Payer: Self-pay | Admitting: Family Medicine

## 2020-04-29 ENCOUNTER — Other Ambulatory Visit: Payer: Self-pay

## 2020-04-29 ENCOUNTER — Ambulatory Visit (INDEPENDENT_AMBULATORY_CARE_PROVIDER_SITE_OTHER): Payer: BC Managed Care – PPO | Admitting: Family Medicine

## 2020-04-29 VITALS — BP 112/57 | HR 64 | Temp 98.2°F | Ht 62.0 in | Wt 239.0 lb

## 2020-04-29 DIAGNOSIS — R7303 Prediabetes: Secondary | ICD-10-CM | POA: Diagnosis not present

## 2020-04-29 DIAGNOSIS — Z9189 Other specified personal risk factors, not elsewhere classified: Secondary | ICD-10-CM

## 2020-04-29 DIAGNOSIS — E7849 Other hyperlipidemia: Secondary | ICD-10-CM

## 2020-04-29 DIAGNOSIS — I10 Essential (primary) hypertension: Secondary | ICD-10-CM

## 2020-04-29 DIAGNOSIS — E559 Vitamin D deficiency, unspecified: Secondary | ICD-10-CM

## 2020-04-29 DIAGNOSIS — Z6841 Body Mass Index (BMI) 40.0 and over, adult: Secondary | ICD-10-CM

## 2020-04-29 DIAGNOSIS — F418 Other specified anxiety disorders: Secondary | ICD-10-CM

## 2020-04-29 DIAGNOSIS — K5909 Other constipation: Secondary | ICD-10-CM

## 2020-04-29 MED ORDER — VITAMIN D (ERGOCALCIFEROL) 1.25 MG (50000 UNIT) PO CAPS: 50000.0000 [IU] | ORAL_CAPSULE | ORAL | 0 refills | Status: DC

## 2020-04-29 NOTE — Patient Instructions (Signed)
The 10-year ASCVD risk score Mikey Bussing DC Brooke Bonito., et al., 2013) is: 4.1%   Values used to calculate the score:     Age: 59 years     Sex: Female     Is Non-Hispanic African American: Yes     Diabetic: No     Tobacco smoker: No     Systolic Blood Pressure: 295 mmHg     Is BP treated: Yes     HDL Cholesterol: 58 mg/dL     Total Cholesterol: 182 mg/dL

## 2020-04-30 LAB — HM MAMMOGRAPHY

## 2020-04-30 NOTE — Progress Notes (Signed)
Chief Complaint:   OBESITY Molly Bean is here to discuss her progress with her obesity treatment plan along with follow-up of her obesity related diagnoses. Molly Bean is on the Category 1 Plan and states she is following her eating plan approximately 90% of the time. Molly Bean states she is exercising for 0 minutes 0 times per week.  Today's visit was #: 2 Starting weight: 240 lbs Starting date: 04/15/2020 Today's weight: 239 lbs Today's date: 04/29/2020 Total lbs lost to date: 1 lb Total lbs lost since last in-office visit: 1 lb  Interim History:  This is my first follow-up with Molly Bean today.  She says she did much better than she thought.  She says her husband did well also.  She goes to the bathroom once weekly or every 10 days.  She says her hunger is controlled and cravings are well-controlled.  No concerns regarding the diet.  She is excited to lose 1 pound after 2 weeks.  Subjective:   1. Essential hypertension Review: taking medications as instructed, no medication side effects noted, no chest pain on exertion, no dyspnea on exertion, no swelling of ankles.  She is taking Norvasc and Maxzide.  Blood pressure is at goal.  BP Readings from Last 3 Encounters:  04/29/20 (!) 112/57  04/15/20 127/61  03/14/20 138/64   2. Prediabetes Molly Bean has a diagnosis of prediabetes based on her elevated HgA1c and was informed this puts her at greater risk of developing diabetes. She continues to work on diet and exercise to decrease her risk of diabetes. She denies nausea or hypoglycemia.  This is a new diagnosis for her.  She is shocked today and scared by this news.  Her mother has diabetes and she is "very heavy as well", she says.  Lab Results  Component Value Date   HGBA1C 6.1 (H) 04/15/2020   Lab Results  Component Value Date   INSULIN 21.9 04/15/2020   3. Other hyperlipidemia Molly Bean has hyperlipidemia and has been trying to improve her cholesterol levels with intensive  lifestyle modification including a low saturated fat diet, exercise and weight loss. She denies any chest pain, claudication or myalgias.  No need for medication at this time. ASCVD 10 year risk is less than 5%.  Lab Results  Component Value Date   ALT 14 04/15/2020   AST 14 04/15/2020   ALKPHOS 83 04/15/2020   BILITOT 0.3 04/15/2020   Lab Results  Component Value Date   CHOL 182 04/15/2020   HDL 58 04/15/2020   LDLCALC 107 (H) 04/15/2020   TRIG 91 04/15/2020   CHOLHDL 3 11/06/2010   4. Vitamin D deficiency Molly Bean's Vitamin D level was 21.9 on 04/15/2020. She is currently taking no vitamin D supplement.  She endorses fatigue.  5. Chronic constipation Molly Bean says she has had this all her life.  She has never gone to the bathroom regularly.  She says she has only gone once every 7-10 days.  She has been to GI for this also.  6. Depression with anxiety and emotional eating Molly Bean is struggling with emotional eating and using food for comfort to the extent that it is negatively impacting her health. She has been working on behavior modification techniques to help reduce her emotional eating and has been unsuccessful. She shows no sign of suicidal or homicidal ideations.  She was referred to Dr. Mallie Mussel at her last office visit.  She is on Zoloft per her PCP.  Mood is stable.    7. At  risk for diabetes mellitus Molly Bean is at higher than average risk for developing diabetes due to her obesity.   Assessment/Plan:   1. Essential hypertension Discussed labs with patient today.  Molly Bean is working on healthy weight loss and exercise to improve blood pressure control. We will watch for signs of hypotension as she continues her lifestyle modifications.  Continue medications.  Continue prudent nutritional plan, weight loss, low salt.  2. Prediabetes New.  Discussed labs with patient today.  Molly Bean will continue to work on weight loss, exercise, and decreasing simple carbohydrates to  help decrease the risk of diabetes.  Continue prudent nutritional plan, weight loss.  Handouts given after extensive education regarding the role of glucose, insulin, etc.  3. Other hyperlipidemia Discussed labs with patient today.  Cardiovascular risk and specific lipid/LDL goals reviewed.  We discussed several lifestyle modifications today and Molly Bean will continue to work on diet, exercise and weight loss efforts. Orders and follow up as documented in patient record.  Continue prudent nutritional plan, weight loss, decrease saturated and trans fats.   Counseling Intensive lifestyle modifications are the first line treatment for this issue.  Dietary changes: Increase soluble fiber. Decrease simple carbohydrates.  Exercise changes: Moderate to vigorous-intensity aerobic activity 150 minutes per week if tolerated.  Lipid-lowering medications: see documented in medical record.  4. Vitamin D deficiency Discussed labs with patient today.  Low Vitamin D level contributes to fatigue and are associated with obesity, breast, and colon cancer. She agrees to start to take prescription Vitamin D @50 ,000 IU every week and will follow-up for routine testing of Vitamin D, at least 2-3 times per year to avoid over-replacement.  Recheck vitamin D level in 3 months. - Vitamin D, Ergocalciferol, (DRISDOL) 1.25 MG (50000 UNIT) CAPS capsule; Take 1 capsule (50,000 Units total) by mouth every 7 (seven) days.  Dispense: 4 capsule; Refill: 0  5. Chronic constipation Discussed labs with patient today.  Molly Bean was informed that a decrease in bowel movement frequency is normal while losing weight, but stools should not be hard or painful. Orders and follow up as documented in patient record.  Adequate hydration.  Follow recommendations of GI doctor and take medications they recommend (if any).  Counseling Getting to Good Bowel Health: Your goal is to have one soft bowel movement each day. Drink at least 8 glasses of  water each day. Eat plenty of fiber (goal is over 25 grams each day). It is best to get most of your fiber from dietary sources which includes leafy green vegetables, fresh fruit, and whole grains. You may need to add fiber with the help of OTC fiber supplements. These include Metamucil, Citrucel, and Flaxseed. If you are still having trouble, try adding Miralax or Magnesium Citrate. If all of these changes do not work, Cab[employer].  6. Depression with anxiety and emotional eating Discussed labs with patient today.  Behavior modification techniques were discussed today to help Molly Bean deal with her emotional/non-hunger eating behaviors.  Orders and follow up as documented in patient record.  Follow-up with Dr. Mallie Mussel as scheduled.  Continue prudent nutritional plan, weight loss.  Gaining control of her nutrition could help improve her mood.  7. At risk for diabetes mellitus Molly Bean was given approximately 15 minutes of diabetes education and counseling today. We discussed intensive lifestyle modifications today with an emphasis on weight loss as well as increasing exercise and decreasing simple carbohydrates in her diet. We also reviewed medication options with an emphasis on risk versus  benefit of those discussed.   Repetitive spaced learning was employed today to elicit superior memory formation and behavioral change.  8. Class 3 severe obesity with serious comorbidity and body mass index (BMI) of 40.0 to 44.9 in adult, unspecified obesity type (HCC) Molly Bean is currently in the action stage of change. As such, her goal is to continue with weight loss efforts. She has agreed to the Category 1 Plan.   Exercise goals: As is.  Behavioral modification strategies: increasing lean protein intake, decreasing simple carbohydrates, increasing water intake, decreasing sodium intake, increasing high fiber foods, meal planning and cooking strategies, keeping healthy foods in the home and planning  for success.  Molly Bean has agreed to follow-up with our clinic in 2 weeks. She was informed of the importance of frequent follow-up visits to maximize her success with intensive lifestyle modifications for her multiple health conditions.   Objective:   Blood pressure (!) 112/57, pulse 64, temperature 98.2 F (36.8 C), height 5\' 2"  (1.575 m), weight (!) 239 lb (108.4 kg), SpO2 97 %. Body mass index is 43.71 kg/m.  General: Cooperative, alert, well developed, in no acute distress. HEENT: Conjunctivae and lids unremarkable. Cardiovascular: Regular rhythm.  Lungs: Normal work of breathing. Neurologic: No focal deficits.   Lab Results  Component Value Date   CREATININE 0.72 04/15/2020   BUN 8 04/15/2020   NA 143 04/15/2020   K 4.3 04/15/2020   CL 103 04/15/2020   CO2 24 04/15/2020   Lab Results  Component Value Date   ALT 14 04/15/2020   AST 14 04/15/2020   ALKPHOS 83 04/15/2020   BILITOT 0.3 04/15/2020   Lab Results  Component Value Date   HGBA1C 6.1 (H) 04/15/2020   Lab Results  Component Value Date   INSULIN 21.9 04/15/2020   Lab Results  Component Value Date   TSH 1.100 04/15/2020   Lab Results  Component Value Date   CHOL 182 04/15/2020   HDL 58 04/15/2020   LDLCALC 107 (H) 04/15/2020   TRIG 91 04/15/2020   CHOLHDL 3 11/06/2010   Lab Results  Component Value Date   WBC 6.9 04/15/2020   HGB 12.1 04/15/2020   HCT 40.3 04/15/2020   MCV 70 (L) 04/15/2020   PLT 225 04/15/2020   Lab Results  Component Value Date   IRON 58 10/27/2018   Attestation Statements:   Reviewed by clinician on day of visit: allergies, medications, problem list, medical history, surgical history, family history, social history, and previous encounter notes.  I, Water quality scientist, CMA, am acting as Location manager for [employer], DO.  I have reviewed the above documentation for accuracy and completeness, and I agree with the above. Mellody Dance, DO

## 2020-05-01 ENCOUNTER — Other Ambulatory Visit: Payer: Self-pay

## 2020-05-01 ENCOUNTER — Telehealth (INDEPENDENT_AMBULATORY_CARE_PROVIDER_SITE_OTHER): Payer: BC Managed Care – PPO | Admitting: Psychology

## 2020-05-01 DIAGNOSIS — F418 Other specified anxiety disorders: Secondary | ICD-10-CM | POA: Diagnosis not present

## 2020-05-02 ENCOUNTER — Encounter: Payer: Self-pay | Admitting: Family

## 2020-05-14 ENCOUNTER — Ambulatory Visit (INDEPENDENT_AMBULATORY_CARE_PROVIDER_SITE_OTHER): Payer: BC Managed Care – PPO | Admitting: Family Medicine

## 2020-05-14 ENCOUNTER — Other Ambulatory Visit: Payer: Self-pay

## 2020-05-14 VITALS — BP 104/67 | HR 74 | Temp 98.5°F | Ht 62.0 in | Wt 236.0 lb

## 2020-05-14 DIAGNOSIS — Z9189 Other specified personal risk factors, not elsewhere classified: Secondary | ICD-10-CM

## 2020-05-14 DIAGNOSIS — Z6841 Body Mass Index (BMI) 40.0 and over, adult: Secondary | ICD-10-CM

## 2020-05-14 DIAGNOSIS — R7303 Prediabetes: Secondary | ICD-10-CM

## 2020-05-14 DIAGNOSIS — E559 Vitamin D deficiency, unspecified: Secondary | ICD-10-CM

## 2020-05-14 DIAGNOSIS — I10 Essential (primary) hypertension: Secondary | ICD-10-CM | POA: Diagnosis not present

## 2020-05-14 MED ORDER — VITAMIN D (ERGOCALCIFEROL) 1.25 MG (50000 UNIT) PO CAPS: 50000.0000 [IU] | ORAL_CAPSULE | ORAL | 0 refills | Status: DC

## 2020-05-15 NOTE — Progress Notes (Signed)
Chief Complaint:   OBESITY Molly Bean is here to discuss her progress with her obesity treatment plan along with follow-up of her obesity related diagnoses. Molly Bean is on the Category 1 Plan and states she is following her eating plan approximately 100% of the time. [name] states she is exercising for 0 minutes 0 times per week.  Today's visit was #: 3 Starting weight: 240 lbs Starting date: 04/15/2020 Today's weight: 236 lbs Today's date: 05/14/2020 Total lbs lost to date: 4 lbs Total lbs lost since last in-office visit: 3 lbs  Interim History: Ger says her hunger is well-controlled.  She has no cravings.  She reports that it is not difficult to get in all of her proteins.  She is tolerating the plan well with no concerns.  Subjective:   1. Prediabetes Molly Bean has a diagnosis of prediabetes based on her elevated HgA1c and was informed this puts her at greater risk of developing diabetes. She continues to work on diet and exercise to decrease her risk of diabetes. She denies nausea or hypoglycemia.  Molly Bean says she just cut candy out of her diet.  Her husband is very supportive and happy with this.  Lab Results  Component Value Date   HGBA1C 6.1 (H) 04/15/2020   Lab Results  Component Value Date   INSULIN 21.9 04/15/2020   2. Vitamin D deficiency [name] Vitamin D level was 21.9 on 04/15/2020. She is currently taking prescription vitamin D 50,000 IU each week. She denies nausea, vomiting or muscle weakness.  3. Essential hypertension Review: taking medications as instructed, no medication side effects noted, no chest pain on exertion, no dyspnea on exertion, no swelling of ankles.  Denies symptoms or concerns.  BP Readings from Last 3 Encounters:  05/14/20 104/67  04/29/20 (!) 112/57  04/15/20 127/61   4. At risk for diabetes mellitus Molly Bean is at higher than average risk for developing diabetes due to her obesity.   Assessment/Plan:   1.  Prediabetes Molly Bean will continue to work on weight loss, exercise, and decreasing simple carbohydrates to help decrease the risk of diabetes.  Continue prudent nutritional plan, weight loss.  Continue to monitor.  She will continue to cut out simple carbs.  2. Vitamin D deficiency Low Vitamin D level contributes to fatigue and are associated with obesity, breast, and colon cancer. She agrees to continue to take prescription Vitamin D @50 ,000 IU every week and will follow-up for routine testing of Vitamin D, at least 2-3 times per year to avoid over-replacement. - Vitamin D, Ergocalciferol, (DRISDOL) 1.25 MG (50000 UNIT) CAPS capsule; Take 1 capsule (50,000 Units total) by mouth every 7 (seven) days.  Dispense: 4 capsule; Refill: 0  3. Essential hypertension Molly Bean is working on healthy weight loss and exercise to improve blood pressure control. We will watch for signs of hypotension as she continues her lifestyle modifications.  Blood pressure is at goal.  Continue medications for now.  Home blood pressure monitoring recommended.  Continue prudent nutritional plan and weight loss.  4. At risk for diabetes mellitus Molly Bean was given approximately 15 minutes of diabetes education and counseling today. We discussed intensive lifestyle modifications today with an emphasis on weight loss as well as increasing exercise and decreasing simple carbohydrates in her diet. We also reviewed medication options with an emphasis on risk versus benefit of those discussed.   Repetitive spaced learning was employed today to elicit superior memory formation and behavioral change.  5. Class 3 severe obesity with serious  comorbidity and body mass index (BMI) of 40.0 to 44.9 in adult, unspecified obesity type (HCC) Molly Bean is currently in the action stage of change. As such, her goal is to continue with weight loss efforts. She has agreed to the Category 1 Plan with breakfast options handout.   Exercise goals: As  is.  Behavioral modification strategies: increasing lean protein intake, increasing vegetables, meal planning and cooking strategies, keeping healthy foods in the home and planning for success.  Molly Bean has agreed to follow-up with our clinic in 2 weeks. She was informed of the importance of frequent follow-up visits to maximize her success with intensive lifestyle modifications for her multiple health conditions.   Objective:   Blood pressure 104/67, pulse 74, temperature 98.5 F (36.9 C), height 5\' 2"  (1.575 m), weight 236 lb (107 kg). Body mass index is 43.16 kg/m.  General: Cooperative, alert, well developed, in no acute distress. HEENT: Conjunctivae and lids unremarkable. Cardiovascular: Regular rhythm.  Lungs: Normal work of breathing. Neurologic: No focal deficits.   Lab Results  Component Value Date   CREATININE 0.72 04/15/2020   BUN 8 04/15/2020   NA 143 04/15/2020   K 4.3 04/15/2020   CL 103 04/15/2020   CO2 24 04/15/2020   Lab Results  Component Value Date   ALT 14 04/15/2020   AST 14 04/15/2020   ALKPHOS 83 04/15/2020   BILITOT 0.3 04/15/2020   Lab Results  Component Value Date   HGBA1C 6.1 (H) 04/15/2020   Lab Results  Component Value Date   INSULIN 21.9 04/15/2020   Lab Results  Component Value Date   TSH 1.100 04/15/2020   Lab Results  Component Value Date   CHOL 182 04/15/2020   HDL 58 04/15/2020   LDLCALC 107 (H) 04/15/2020   TRIG 91 04/15/2020   CHOLHDL 3 11/06/2010   Lab Results  Component Value Date   WBC 6.9 04/15/2020   HGB 12.1 04/15/2020   HCT 40.3 04/15/2020   MCV 70 (L) 04/15/2020   PLT 225 04/15/2020   Lab Results  Component Value Date   IRON 58 10/27/2018   Attestation Statements:   Reviewed by clinician on day of visit: allergies, medications, problem list, medical history, surgical history, family history, social history, and previous encounter notes.  I, Water quality scientist, CMA, am acting as Location manager for [employer], DO.  I have reviewed the above documentation for accuracy and completeness, and I agree with the above. Mellody Dance, DO

## 2020-05-28 ENCOUNTER — Encounter (INDEPENDENT_AMBULATORY_CARE_PROVIDER_SITE_OTHER): Payer: Self-pay | Admitting: Family Medicine

## 2020-05-28 ENCOUNTER — Other Ambulatory Visit: Payer: Self-pay

## 2020-05-28 ENCOUNTER — Ambulatory Visit (INDEPENDENT_AMBULATORY_CARE_PROVIDER_SITE_OTHER): Payer: BC Managed Care – PPO | Admitting: Family Medicine

## 2020-05-28 VITALS — BP 100/72 | HR 61 | Temp 97.8°F | Ht 62.0 in | Wt 237.0 lb

## 2020-05-28 DIAGNOSIS — Z6841 Body Mass Index (BMI) 40.0 and over, adult: Secondary | ICD-10-CM

## 2020-05-28 DIAGNOSIS — I10 Essential (primary) hypertension: Secondary | ICD-10-CM

## 2020-05-28 DIAGNOSIS — E559 Vitamin D deficiency, unspecified: Secondary | ICD-10-CM

## 2020-05-28 DIAGNOSIS — Z9189 Other specified personal risk factors, not elsewhere classified: Secondary | ICD-10-CM

## 2020-05-29 NOTE — Progress Notes (Signed)
Chief Complaint:   OBESITY Molly Bean is here to discuss her progress with her obesity treatment plan along with follow-up of her obesity related diagnoses. Molly Bean is on the Category 1 Plan and states she is following her eating plan approximately 50% of the time. Molly Bean states she is exercising for 0 minutes 0 times per week.  Today's visit was #: 4 Starting weight: 240 lbs Starting date: 04/15/2020 Today's weight: 237 lbs Today's date: 05/28/2020 Total lbs lost to date: 3 lbs Total lbs lost since last in-office visit: 0  Interim History: Molly Bean is eating everything on plan.  She says she is not weighing proteins yet because she just received her scale.  Water with lemon is the only fluid she is taking in.  For snacks, she has prunes (8-10 per day), pistachio nuts, walnuts, and is snacking on various nuts.  She snacks on dill pickles, cheese.  She does not look at labels and is not measuring.  She eats [employer] apple pecan salad.  Subjective:   1. Essential hypertension Review: taking medications as instructed, no medication side effects noted, no chest pain on exertion, no dyspnea on exertion, no swelling of ankles.  She is on Norvasc and Maxzide.   BP Readings from Last 3 Encounters:  05/28/20 100/72  05/14/20 104/67  04/29/20 (!) 112/57   2. Vitamin D deficiency Molly Bean's Vitamin D level was 21.9 on 04/15/2020. She is currently taking prescription vitamin D 50,000 IU each week. She denies nausea, vomiting or muscle weakness.  3. At high risk for fluid overload Molly Bean is at a higher than average risk for fluid retention due to obesity. Reviewed: no chest pain on exertion, no dyspnea at rest, and no swelling of ankles.  She has 5 pound weight gain from increased salt intake of 4-5+ grams per day.  Assessment/Plan:   1. Essential hypertension Molly Bean is working on healthy weight loss and exercise to improve blood pressure control. We will watch for signs of hypotension  as she continues her lifestyle modifications.  Decrease salt intake.  2. Vitamin D deficiency Low Vitamin D level contributes to fatigue and are associated with obesity, breast, and colon cancer. She agrees to continue to take prescription Vitamin D @50 ,000 IU every week and will follow-up for routine testing of Vitamin D, at least 2-3 times per year to avoid over-replacement.  -Refill Vitamin D, Ergocalciferol, (DRISDOL) 1.25 MG (50000 UNIT) CAPS capsule; Take 1 capsule (50,000 Units total) by mouth every 7 (seven) days.  Dispense: 4 capsule; Refill: 0  3. At high risk for fluid overload Molly Bean was given approximately 9+ minutes of fluid retention prevention counseling today. She is 59 y.o. female and has risk factors for fluid retention including obesity. We discussed intensive lifestyle modifications today with an emphasis on specific weight loss instructions, proper nutrition and exercise strategies.   Repetitive spaced learning was employed today to elicit superior memory formation and behavioral change.  4. Class 3 severe obesity with serious comorbidity and body mass index (BMI) of 40.0 to 44.9 in adult, unspecified obesity type (HCC) Molly Bean is currently in the action stage of change. As such, her goal is to continue with weight loss efforts. She has agreed to the Category 1 Plan with lunch options as she does not like bread.   Exercise goals: As is.  Behavioral modification strategies: increasing lean protein intake, decreasing simple carbohydrates, increasing water intake, decreasing sodium intake, meal planning and cooking strategies, better snacking choices and planning for success.  She is going to go over the plan and learn it and weight and measure foods and also she will decrease her salt intake and increase water intake.  Molly Bean has agreed to follow-up with our clinic in 2 weeks. She was informed of the importance of frequent follow-up visits to maximize her success with  intensive lifestyle modifications for her multiple health conditions.   Objective:   Blood pressure 100/72, pulse 61, temperature 97.8 F (36.6 C), height 5\' 2"  (1.575 m), weight 237 lb (107.5 kg), SpO2 98 %. Body mass index is 43.35 kg/m.  General: Cooperative, alert, well developed, in no acute distress. HEENT: Conjunctivae and lids unremarkable. Cardiovascular: Regular rhythm.  Lungs: Normal work of breathing. Neurologic: No focal deficits.   Lab Results  Component Value Date   CREATININE 0.72 04/15/2020   BUN 8 04/15/2020   NA 143 04/15/2020   K 4.3 04/15/2020   CL 103 04/15/2020   CO2 24 04/15/2020   Lab Results  Component Value Date   ALT 14 04/15/2020   AST 14 04/15/2020   ALKPHOS 83 04/15/2020   BILITOT 0.3 04/15/2020   Lab Results  Component Value Date   HGBA1C 6.1 (H) 04/15/2020   Lab Results  Component Value Date   INSULIN 21.9 04/15/2020   Lab Results  Component Value Date   TSH 1.100 04/15/2020   Lab Results  Component Value Date   CHOL 182 04/15/2020   HDL 58 04/15/2020   LDLCALC 107 (H) 04/15/2020   TRIG 91 04/15/2020   CHOLHDL 3 11/06/2010   Lab Results  Component Value Date   WBC 6.9 04/15/2020   HGB 12.1 04/15/2020   HCT 40.3 04/15/2020   MCV 70 (L) 04/15/2020   PLT 225 04/15/2020   Lab Results  Component Value Date   IRON 58 10/27/2018   Attestation Statements:   Reviewed by clinician on day of visit: allergies, medications, problem list, medical history, surgical history, family history, social history, and previous encounter notes.  I, Water quality scientist, CMA, am acting as Location manager for [employer], DO.  I have reviewed the above documentation for accuracy and completeness, and I agree with the above. -  Mellody Dance, DO

## 2020-06-04 MED ORDER — VITAMIN D (ERGOCALCIFEROL) 1.25 MG (50000 UNIT) PO CAPS: 50000.0000 [IU] | ORAL_CAPSULE | ORAL | 0 refills | Status: DC

## 2020-06-16 ENCOUNTER — Encounter (INDEPENDENT_AMBULATORY_CARE_PROVIDER_SITE_OTHER): Payer: Self-pay | Admitting: Family Medicine

## 2020-06-16 ENCOUNTER — Ambulatory Visit (INDEPENDENT_AMBULATORY_CARE_PROVIDER_SITE_OTHER): Payer: BC Managed Care – PPO | Admitting: Family Medicine

## 2020-06-16 ENCOUNTER — Other Ambulatory Visit: Payer: Self-pay

## 2020-06-16 VITALS — BP 121/68 | HR 59 | Temp 98.8°F | Ht 62.0 in | Wt 235.0 lb

## 2020-06-16 DIAGNOSIS — Z6841 Body Mass Index (BMI) 40.0 and over, adult: Secondary | ICD-10-CM

## 2020-06-16 DIAGNOSIS — E559 Vitamin D deficiency, unspecified: Secondary | ICD-10-CM | POA: Diagnosis not present

## 2020-06-16 DIAGNOSIS — Z9189 Other specified personal risk factors, not elsewhere classified: Secondary | ICD-10-CM

## 2020-06-16 DIAGNOSIS — K5909 Other constipation: Secondary | ICD-10-CM

## 2020-06-16 MED ORDER — POLYETHYLENE GLYCOL 3350 17 G PO PACK: PACK | ORAL | Status: DC

## 2020-06-16 MED ORDER — VITAMIN D (ERGOCALCIFEROL) 1.25 MG (50000 UNIT) PO CAPS: 50000.0000 [IU] | ORAL_CAPSULE | ORAL | 0 refills | Status: DC

## 2020-06-18 NOTE — Progress Notes (Signed)
Chief Complaint:   OBESITY Molly Bean is here to discuss her progress with her obesity treatment plan along with follow-up of her obesity related diagnoses. Molly Bean is on the Category 1 Plan and states she is following her eating plan approximately 80% of the time. Molly Bean states she is exercising for 0 minutes 0 times per week.  Today's visit was #: 5 Starting weight: 240 lbs Starting date: 04/15/2020 Today's weight: 235 lbs Today's date: 06/16/2020 Total lbs lost to date: 5 lbs Total lbs lost since last in-office visit: 2 lbs  Interim History: Molly Bean says she cut out salads that she buys at [facility], etc.  She is eating more vegetables/spinach.  She says she has been doing better with eating all the foods on the plan.  She is limiting "off plan foods".  Overall, she is very happy with her progress.  She likes the plan and has no concerns.  Hunger and cravings are controlled.   Subjective:   1. Vitamin D deficiency Molly Bean's Vitamin D level was 21.9 on 04/15/2020. She is currently taking prescription vitamin D 50,000 IU each week. She denies nausea, vomiting or muscle weakness.  2. Other constipation With eating better, she is having more constipation.  She increased water from 3 bottles per day to 4 per day.  Only occasional Metamucil and MiraLAX.  3. At risk for osteoporosis Molly Bean was given approximately 16 minutes of osteoporosis prevention counseling today.   Molly Bean is at risk for osteopenia and osteoporosis due to Vitamin D deficiency, as well as other risk factors.  We discussed the importance of prudent screenings through she PCP's office for prevention.  she was encouraged to take she Vitamin D and follow she calcium rich diet.  It is recommended that she eventually engage in weight bearing exercises and muscle strengthening exercises to help improve bone density and decrease she risk of osteopenia and osteoporosis.  Assessment/Plan:   1. Vitamin D deficiency Low  Vitamin D level contributes to fatigue and are associated with obesity, breast, and colon cancer. She agrees to continue to take prescription Vitamin D @50 ,000 IU every week and will follow-up for routine testing of Vitamin D, at least 2-3 times per year to avoid over-replacement.  -Refill Vitamin D, Ergocalciferol, (DRISDOL) 1.25 MG (50000 UNIT) CAPS capsule; Take 1 capsule (50,000 Units total) by mouth every 7 (seven) days.  Dispense: 4 capsule; Refill: 0  2. Other constipation Molly Bean was informed that a decrease in bowel movement frequency is normal while losing weight, but stools should not be hard or painful. Orders and follow up as documented in patient record.  Start MiraLAX everyday and as needed twice daily.  Increase water.  Start walking daily.  Counseling Getting to Good Bowel Health: Your goal is to have one soft bowel movement each day. Drink at least 8 glasses of water each day. Eat plenty of fiber (goal is over 25 grams each day). It is best to get most of your fiber from dietary sources which includes leafy green vegetables, fresh fruit, and whole grains. You may need to add fiber with the help of OTC fiber supplements. These include Metamucil, Citrucel, and Flaxseed. If you are still having trouble, try adding Miralax or Magnesium Citrate. If all of these changes do not work, Cabin crew.  3. At risk for osteoporosis Molly Bean was given approximately 16 minutes of osteoporosis prevention counseling today.   Molly Bean is at risk for osteopenia and osteoporosis due to Vitamin D deficiency, as well  as other risk factors.  We discussed the importance of prudent screenings through her PCP's office for prevention.     Molly Bean was encouraged to take her Vitamin D and follow her calcium rich diet.  We will continue to monitor vitamin D levels to ensure treatment is appropriate.   It is recommended that she eventually engage in weight bearing exercises and muscle strengthening  exercises to help improve bone density and decrease her risk of osteopenia and osteoporosis.   4. Class 3 severe obesity with serious comorbidity and body mass index (BMI) of 40.0 to 44.9 in adult, unspecified obesity type (HCC) Molly Bean is currently in the action stage of change. As such, her goal is to continue with weight loss efforts. She has agreed to the Category 1 Plan.   Exercise goals: Start 20 minutes of walking per day.  Behavioral modification strategies: increasing lean protein intake and increasing water intake.  Molly Bean has agreed to follow-up with our clinic in 2 weeks. She was informed of the importance of frequent follow-up visits to maximize her success with intensive lifestyle modifications for her multiple health conditions.   Objective:   Blood pressure 121/68, pulse (!) 59, temperature 98.8 F (37.1 C), height 5\' 2"  (1.575 m), weight 235 lb (106.6 kg), SpO2 98 %. Body mass index is 42.98 kg/m.  General: Cooperative, alert, well developed, in no acute distress. HEENT: Conjunctivae and lids unremarkable. Cardiovascular: Regular rhythm.  Lungs: Normal work of breathing. Neurologic: No focal deficits.   Lab Results  Component Value Date   CREATININE 0.72 04/15/2020   BUN 8 04/15/2020   NA 143 04/15/2020   K 4.3 04/15/2020   CL 103 04/15/2020   CO2 24 04/15/2020   Lab Results  Component Value Date   ALT 14 04/15/2020   AST 14 04/15/2020   ALKPHOS 83 04/15/2020   BILITOT 0.3 04/15/2020   Lab Results  Component Value Date   HGBA1C 6.1 (H) 04/15/2020   Lab Results  Component Value Date   INSULIN 21.9 04/15/2020   Lab Results  Component Value Date   TSH 1.100 04/15/2020   Lab Results  Component Value Date   CHOL 182 04/15/2020   HDL 58 04/15/2020   LDLCALC 107 (H) 04/15/2020   TRIG 91 04/15/2020   CHOLHDL 3 11/06/2010   Lab Results  Component Value Date   WBC 6.9 04/15/2020   HGB 12.1 04/15/2020   HCT 40.3 04/15/2020   MCV 70 (L)  04/15/2020   PLT 225 04/15/2020   Lab Results  Component Value Date   IRON 58 10/27/2018   Attestation Statements:   Reviewed by clinician on day of visit: allergies, medications, problem list, medical history, surgical history, family history, social history, and previous encounter notes.  I, Water quality scientist, CMA, am acting as Location manager for [employer], DO.  I have reviewed the above documentation for accuracy and completeness, and I agree with the above. -  Mellody Dance, DO

## 2020-06-19 ENCOUNTER — Encounter (INDEPENDENT_AMBULATORY_CARE_PROVIDER_SITE_OTHER): Payer: Self-pay

## 2020-06-30 ENCOUNTER — Ambulatory Visit (INDEPENDENT_AMBULATORY_CARE_PROVIDER_SITE_OTHER): Payer: BC Managed Care – PPO | Admitting: Family Medicine

## 2020-07-01 ENCOUNTER — Ambulatory Visit (INDEPENDENT_AMBULATORY_CARE_PROVIDER_SITE_OTHER): Payer: BC Managed Care – PPO | Admitting: Physician Assistant

## 2020-07-08 ENCOUNTER — Ambulatory Visit (INDEPENDENT_AMBULATORY_CARE_PROVIDER_SITE_OTHER): Payer: BC Managed Care – PPO | Admitting: Family Medicine

## 2020-07-21 ENCOUNTER — Ambulatory Visit (INDEPENDENT_AMBULATORY_CARE_PROVIDER_SITE_OTHER): Payer: BC Managed Care – PPO | Admitting: Family Medicine

## 2020-07-21 ENCOUNTER — Other Ambulatory Visit: Payer: Self-pay

## 2020-07-21 VITALS — BP 136/73 | HR 42 | Temp 98.3°F | Ht 62.0 in | Wt 239.0 lb

## 2020-07-21 DIAGNOSIS — Z9189 Other specified personal risk factors, not elsewhere classified: Secondary | ICD-10-CM

## 2020-07-21 DIAGNOSIS — I10 Essential (primary) hypertension: Secondary | ICD-10-CM

## 2020-07-21 DIAGNOSIS — E559 Vitamin D deficiency, unspecified: Secondary | ICD-10-CM

## 2020-07-21 DIAGNOSIS — K5909 Other constipation: Secondary | ICD-10-CM

## 2020-07-21 DIAGNOSIS — Z6841 Body Mass Index (BMI) 40.0 and over, adult: Secondary | ICD-10-CM

## 2020-07-21 MED ORDER — LINACLOTIDE 145 MCG PO CAPS: ORAL_CAPSULE | ORAL | 0 refills | Status: DC

## 2020-07-21 MED ORDER — VITAMIN D (ERGOCALCIFEROL) 1.25 MG (50000 UNIT) PO CAPS: 50000.0000 [IU] | ORAL_CAPSULE | ORAL | 0 refills | Status: DC

## 2020-07-23 ENCOUNTER — Other Ambulatory Visit: Payer: Self-pay

## 2020-07-23 ENCOUNTER — Ambulatory Visit (INDEPENDENT_AMBULATORY_CARE_PROVIDER_SITE_OTHER): Payer: BC Managed Care – PPO

## 2020-07-23 ENCOUNTER — Other Ambulatory Visit: Payer: Self-pay | Admitting: Family

## 2020-07-23 ENCOUNTER — Encounter: Payer: Self-pay | Admitting: Family

## 2020-07-23 ENCOUNTER — Ambulatory Visit (INDEPENDENT_AMBULATORY_CARE_PROVIDER_SITE_OTHER): Payer: BC Managed Care – PPO | Admitting: Family

## 2020-07-23 VITALS — BP 132/74 | HR 68 | Temp 98.6°F | Ht 62.0 in | Wt 243.0 lb

## 2020-07-23 DIAGNOSIS — L732 Hidradenitis suppurativa: Secondary | ICD-10-CM | POA: Diagnosis not present

## 2020-07-23 DIAGNOSIS — R059 Cough, unspecified: Secondary | ICD-10-CM

## 2020-07-23 DIAGNOSIS — M545 Low back pain, unspecified: Secondary | ICD-10-CM

## 2020-07-23 DIAGNOSIS — K5909 Other constipation: Secondary | ICD-10-CM

## 2020-07-23 DIAGNOSIS — I10 Essential (primary) hypertension: Secondary | ICD-10-CM

## 2020-07-23 DIAGNOSIS — Z23 Encounter for immunization: Secondary | ICD-10-CM | POA: Diagnosis not present

## 2020-07-23 MED ORDER — TRIAMTERENE-HCTZ 37.5-25 MG PO TABS: ORAL_TABLET | ORAL | 1 refills | Status: DC

## 2020-07-23 MED ORDER — MUPIROCIN 2 % EX OINT: TOPICAL_OINTMENT | CUTANEOUS | 0 refills | Status: DC

## 2020-07-23 MED ORDER — METHOCARBAMOL 500 MG PO TABS: 500.0000 mg | ORAL_TABLET | Freq: Three times a day (TID) | ORAL | 0 refills | Status: DC | PRN

## 2020-07-23 MED ORDER — ALBUTEROL SULFATE HFA 108 (90 BASE) MCG/ACT IN AERS: INHALATION_SPRAY | RESPIRATORY_TRACT | 5 refills | Status: DC

## 2020-07-23 NOTE — Progress Notes (Signed)
Molly Bean is a 59 y.o. female with the following history as recorded in EpicCare:  Patient Active Problem List   Diagnosis Date Noted  . Dysuria 04/09/2020  . Vaginitis 04/09/2020  . Thumb pain, right 11/03/2018  . Essential hypertension 05/01/2018  . Cough 03/24/2018  . SOB (shortness of breath) 03/24/2018  . Asthma 03/14/2018  . Hidradenitis suppurativa 01/18/2018  . Generalized anxiety disorder 10/11/2016  . Chronic shoulder pain 06/09/2016  . MENOPAUSAL SYNDROME 04/23/2009  . OBESITY 02/15/2008  . Depression 04/12/2007  . Gastroesophageal reflux disease 04/12/2007  . Edema 04/12/2007    Current Outpatient Medications  Medication Sig Dispense Refill  . acetaminophen (TYLENOL) 500 MG tablet Take 1,000 mg by mouth every 6 (six) hours as needed for mild pain or headache.    . albuterol (VENTOLIN HFA) 108 (90 Base) MCG/ACT inhaler TAKE 2 PUFFS BY MOUTH EVERY 6 HOURS AS NEEDED FOR WHEEZE OR SHORTNESS OF BREATH 8.5 g 5  . fluconazole (DIFLUCAN) 150 MG tablet 1 tab by mouth every 3 days as needed 2 tablet 1  . linaclotide (LINZESS) 145 MCG CAPS capsule 1-2 caps qd for constipation 60 capsule 0  . triamterene-hydrochlorothiazide (MAXZIDE-25) 37.5-25 MG tablet TAKE 0.5-1 TABLET BY MOUTH DAILY. 90 tablet 1  . Vitamin D, Ergocalciferol, (DRISDOL) 1.25 MG (50000 UNIT) CAPS capsule Take 1 capsule (50,000 Units total) by mouth every 7 (seven) days. 4 capsule 0  . amLODipine (NORVASC) 10 MG tablet Take 1 tablet (10 mg total) by mouth daily. 90 tablet 3  . methocarbamol (ROBAXIN) 500 MG tablet Take 1 tablet (500 mg total) by mouth every 8 (eight) hours as needed for muscle spasms. 30 tablet 0  . mupirocin ointment (BACTROBAN) 2 % Apply bid to nasal passages as directed x 5 days 22 g 0   No current facility-administered medications for this visit.    Allergies: Pennsaid [diclofenac sodium], Oxycodone, and Shellfish allergy  Past Medical History:  Diagnosis Date  . Anxiety   .  Constipation   . DEPRESSION   . edema lower extremeties   . GERD   . Joint pain   . MENOPAUSAL SYNDROME   . Multiple food allergies   . OBESITY   . Shortness of breath   . Sleep apnea   . UTI'S, CHRONIC     Past Surgical History:  Procedure Laterality Date  . ABDOMINAL HYSTERECTOMY  11/1998   total fibroids  . CESAREAN SECTION      Family History  Problem Relation Age of Onset  . Coronary artery disease Mother   . Hypertension Mother   . Diabetes Mother   . Atrial fibrillation Mother   . Sleep apnea Mother   . Obesity Mother   . Seizures Father   . Stroke Father   . Lupus Cousin   . Breast cancer Sister 57    Social History   Tobacco Use  . Smoking status: Former Smoker    Packs/day: 0.30    Years: 20.00    Pack years: 6.00    Types: Cigarettes    Quit date: 10/04/1977    Years since quitting: 42.8  . Smokeless tobacco: Former Systems developer    Quit date: 10/05/1987  Substance Use Topics  . Alcohol use: No    Subjective:  Presents with numerous concerns:  1) History of chronic constipation- has seen GI in the past and Linzess was discussed; her weight loss provider has prescribed but PA pending with insurance; asking for possible samples; 2) Recurrent boils under  armpits- has been seen here numerous times; 3) Low back "locked up" earlier this week- requesting work note 4) Considering flu shot- has questions; has never taken   Objective:  Vitals:   07/23/20 0952  BP: 132/74  Pulse: 68  Temp: 98.6 F (37 C)  TempSrc: Oral  SpO2: 99%  Weight: 243 lb (110.2 kg)  Height: 5\' 2"  (1.575 m)    General: Well developed, well nourished, in no acute distress  Skin : Warm and dry.  Head: Normocephalic and atraumatic  Eyes: Sclera and conjunctiva clear; pupils round and reactive to light; extraocular movements intact  Ears: External normal; canals clear; tympanic membranes normal  Oropharynx: Pink, supple. No suspicious lesions  Neck: Supple without thyromegaly, adenopathy   Lungs: Respirations unlabored; clear to auscultation bilaterally without wheeze, rales, rhonchi  CVS exam: normal rate and regular rhythm.  Abdomen: Soft; nontender; nondistended; normoactive bowel sounds; no masses or hepatosplenomegaly  Musculoskeletal: No deformities; no active joint inflammation  Extremities: No edema, cyanosis, clubbing  Vessels: Symmetric bilaterally  Neurologic: Alert and oriented; speech intact; face symmetrical; moves all extremities well; CNII-XII intact without focal deficit   Assessment:  1. Hidradenitis axillaris   2. Cough   3. Essential hypertension   4. Acute low back pain, unspecified back pain laterality, unspecified whether sciatica present   5. Flu vaccine need   6. Chronic constipation     Plan:  1. Refer to surgeon; trial of Bactroban for nasal colonization treatment; 2. Refill updated on albuterol; 3. Stable; refill updated; 4. Update lumbar X-ray; 5. Sample of Linzess 145 mg; follow-up with GI if symptoms persist;  This visit occurred during the SARS-CoV-2 public health emergency.  Safety protocols were in place, including screening questions prior to the visit, additional usage of staff PPE, and extensive cleaning of exam room while observing appropriate contact time as indicated for disinfecting solutions.     No follow-ups on file.  Orders Placed This Encounter  Procedures  . DG Lumbar Spine 2-3 Views    Standing Status:   Future    Standing Expiration Date:   07/23/2021    Order Specific Question:   Reason for Exam (SYMPTOM  OR DIAGNOSIS REQUIRED)    Answer:   low back pain    Order Specific Question:   Preferred imaging location?    Answer:   [facility]  . Flu Vaccine QUAD 6+ mos PF IM (Fluarix Quad PF)  . Ambulatory referral to General Surgery    Referral Priority:   Routine    Referral Type:   Surgical    Referral Reason:   Specialty Services Required    Requested Specialty:   General Surgery    Number of Visits  Requested:   1    Requested Prescriptions   Signed Prescriptions Disp Refills  . albuterol (VENTOLIN HFA) 108 (90 Base) MCG/ACT inhaler 8.5 g 5    Sig: TAKE 2 PUFFS BY MOUTH EVERY 6 HOURS AS NEEDED FOR WHEEZE OR SHORTNESS OF BREATH  . triamterene-hydrochlorothiazide (MAXZIDE-25) 37.5-25 MG tablet 90 tablet 1    Sig: TAKE 0.5-1 TABLET BY MOUTH DAILY.  . mupirocin ointment (BACTROBAN) 2 % 22 g 0    Sig: Apply bid to nasal passages as directed x 5 days  . methocarbamol (ROBAXIN) 500 MG tablet 30 tablet 0    Sig: Take 1 tablet (500 mg total) by mouth every 8 (eight) hours as needed for muscle spasms.

## 2020-07-23 NOTE — Progress Notes (Signed)
Chief Complaint:   OBESITY Molly Bean is here to discuss her progress with her obesity treatment plan along with follow-up of her obesity related diagnoses. Molly Bean is on the Category 1 Plan and states she is following her eating plan approximately 80% of the time. Molly Bean states she is treadmill for 15 minutes 7 times per week.  Today's visit was #: 6 Starting weight: 240 lbs Starting date: 04/15/2020 Today's weight: 239 lbs Today's date: 07/21/2020 Total lbs lost to date: 1 lb Total lbs lost since last in-office visit: +4 lbs  Interim History: Molly Bean has not followed the plan for 1 week due to pulling a muscle in her back.  She has been doing more snacking. She has had R[employer]les potato chips and drank 2 sodas.  Assessment/Plan:   1. Vitamin D deficiency Molly Bean has a history of Vitamin D deficiency with resultant generalized fatigue as her primary symptom.  she is taking vitamin D 50,000 IU weekly for this deficiency and tolerating it well without side-effect.   Most recent Vitamin D lab reviewed-  level: 21.9.  Plan:   - Discussed importance of vitamin D (as well as calcium) to their health and well-being.   - We reviewed possible symptoms of low Vitamin D including low energy, depressed mood, muscle aches, joint aches, osteoporosis etc.  - We discussed that low Vitamin D levels may be linked to an increased risk of cardiovascular events and even increased risk of cancers- such as colon and breast.   - Educated pt that weight loss will likely improve availability of vitamin D, thus encouraged Molly Bean to continue with meal plan and their weight loss efforts to further improve this condition  - I recommend pt take a weekly prescription vit D- see script below- which pt agrees to after discussion of risks and benefits of this medication.      - Informed patient this may be a lifelong thing, and she was encouraged to continue to take the medicine until pt told  otherwise.   We will need to monitor levels regularly ( q 3-4 mo on average )  to keep levels within normal limits.   - All pt's questions and concerns regarding this condition addressed  -Refill Vitamin D, Ergocalciferol, (DRISDOL) 1.25 MG (50000 UNIT) CAPS capsule; Take 1 capsule (50,000 Units total) by mouth every 7 (seven) days.  Dispense: 4 capsule; Refill: 0  2. Essential hypertension She is taking Maxzide-25 and Norvasc.  No concerns or complaints.  Plan:  Blood pressure is at goal.  Continue medications at current dose.  Continue prudent nutritional plan and weight loss.  Increase exercise as tolerated.  BP Readings from Last 3 Encounters:  07/23/20 132/74  07/21/20 136/73  06/16/20 121/68   3. Other constipation Metamucil, MiraLAX every other day.  Still with constipation.  Last bowel movement was 5 days ago or so.  Typically only goes once a week and has been like that for 5 years now.  She has been sitting more at work.  She is now driking four 20 ounce bottles of water per day.  She had a normal colonoscopy last year.    Plan:  She has an appointment with her PCP in 2 days.  -Start linaclotide (LINZESS) 145 MCG CAPS capsule; 1-2 caps qd for constipation  Dispense: 60 capsule; Refill: 0  4. At risk for diarrhea Blossom was given approximately 9 minutes of diarrhea prevention counseling today. She is 59 y.o. female and has risk factors for  diarrhea including use of Linzess and changes in diet. We discussed intensive lifestyle modifications today with an emphasis on specific weight loss instructions including dietary strategies.   5. Class 3 severe obesity with serious comorbidity and body mass index (BMI) of 40.0 to 44.9 in adult, unspecified obesity type (HCC)  Molly Bean is currently in the action stage of change. As such, her goal is to continue with weight loss efforts. She has agreed to the Category 1 Plan.   Exercise goals: For substantial health benefits, adults should  do at least 150 minutes (2 hours and 30 minutes) a week of moderate-intensity, or 75 minutes (1 hour and 15 minutes) a week of vigorous-intensity aerobic physical activity, or an equivalent combination of moderate- and vigorous-intensity aerobic activity. Aerobic activity should be performed in episodes of at least 10 minutes, and preferably, it should be spread throughout the week. Eventual goal.  Try to increase walking to 20-30 minutes daily.  Behavioral modification strategies: increasing water intake, meal planning and cooking strategies, keeping healthy foods in the home and planning for success.  Molly Bean has agreed to follow-up with our clinic in 2 weeks. She was informed of the importance of frequent follow-up visits to maximize her success with intensive lifestyle modifications for her multiple health conditions.   Objective:   Blood pressure 136/73, pulse (!) 42, temperature 98.3 F (36.8 C), height 5\' 2"  (1.575 m), weight 239 lb (108.4 kg), SpO2 99 %. Body mass index is 43.71 kg/m.  General: Cooperative, alert, well developed, in no acute distress. HEENT: Conjunctivae and lids unremarkable. Cardiovascular: Regular rhythm.  Lungs: Normal work of breathing. Neurologic: No focal deficits.   Lab Results  Component Value Date   CREATININE 0.72 04/15/2020   BUN 8 04/15/2020   NA 143 04/15/2020   K 4.3 04/15/2020   CL 103 04/15/2020   CO2 24 04/15/2020   Lab Results  Component Value Date   ALT 14 04/15/2020   AST 14 04/15/2020   ALKPHOS 83 04/15/2020   BILITOT 0.3 04/15/2020   Lab Results  Component Value Date   HGBA1C 6.1 (H) 04/15/2020   Lab Results  Component Value Date   INSULIN 21.9 04/15/2020   Lab Results  Component Value Date   TSH 1.100 04/15/2020   Lab Results  Component Value Date   CHOL 182 04/15/2020   HDL 58 04/15/2020   LDLCALC 107 (H) 04/15/2020   TRIG 91 04/15/2020   CHOLHDL 3 11/06/2010   Lab Results  Component Value Date   WBC 6.9  04/15/2020   HGB 12.1 04/15/2020   HCT 40.3 04/15/2020   MCV 70 (L) 04/15/2020   PLT 225 04/15/2020   Lab Results  Component Value Date   IRON 58 10/27/2018   Attestation Statements:   Reviewed by clinician on day of visit: allergies, medications, problem list, medical history, surgical history, family history, social history, and previous encounter notes.  I, Water quality scientist, CMA, am acting as Location manager for [employer], DO.  I have reviewed the above documentation for accuracy and completeness, and I agree with the above. Marjory Sneddon, D.O.  The [place] was signed into law in 2016 which includes the topic of electronic health records.  This provides immediate access to information in MyChart.  This includes consultation notes, operative notes, office notes, lab results and pathology reports.  If you have any questions about what you read please let us know at your next visit so we can discuss  your concerns and take corrective action if need be.  We are right here with you.

## 2020-08-06 ENCOUNTER — Other Ambulatory Visit: Payer: Self-pay

## 2020-08-06 ENCOUNTER — Ambulatory Visit (INDEPENDENT_AMBULATORY_CARE_PROVIDER_SITE_OTHER): Payer: BC Managed Care – PPO | Admitting: Family Medicine

## 2020-08-06 VITALS — BP 104/60 | HR 74 | Temp 98.1°F | Ht 62.0 in | Wt 238.0 lb

## 2020-08-06 DIAGNOSIS — E559 Vitamin D deficiency, unspecified: Secondary | ICD-10-CM | POA: Insufficient documentation

## 2020-08-06 DIAGNOSIS — E7849 Other hyperlipidemia: Secondary | ICD-10-CM | POA: Insufficient documentation

## 2020-08-06 DIAGNOSIS — I10 Essential (primary) hypertension: Secondary | ICD-10-CM | POA: Diagnosis not present

## 2020-08-06 DIAGNOSIS — R7303 Prediabetes: Secondary | ICD-10-CM | POA: Diagnosis not present

## 2020-08-06 DIAGNOSIS — K5909 Other constipation: Secondary | ICD-10-CM | POA: Diagnosis not present

## 2020-08-06 DIAGNOSIS — Z9189 Other specified personal risk factors, not elsewhere classified: Secondary | ICD-10-CM | POA: Insufficient documentation

## 2020-08-06 DIAGNOSIS — Z6841 Body Mass Index (BMI) 40.0 and over, adult: Secondary | ICD-10-CM

## 2020-08-07 NOTE — Progress Notes (Signed)
Chief Complaint:   OBESITY Molly Bean is here to discuss her progress with her obesity treatment plan along with follow-up of her obesity related diagnoses. Molly Bean is on the Category 1 Plan and states she is following her eating plan approximately 75% of the time. Molly Bean states she is exercising for 0 minutes 0 times per week.  Today's visit was #: 7 Starting weight: 240 lbs Starting date: 04/15/2020 Today's weight: 238 lbs Today's date: 08/06/2020 Total lbs lost to date: 2 lbs Total lbs lost since last in-office visit: 1 lb Total weight loss percentage to date: -0.83%  Interim History:  At last office visit, Molly Bean gained 4 pounds, but got back on her plan and she lost 1 pound.  There were 3-4 days where she did not follow the plan and ate cookies and chips.  Otherwise, she is following the plan and has no concerns.  Assessment/Plan:     1. Essential hypertension Review: taking medications as instructed, no medication side effects noted, no chest pain on exertion, no dyspnea on exertion, no swelling of ankles.  No symptoms or concerns.  Tolerating medication well.  Plan:  Check blood pressure at home every day or two.  Keep log and bring in to next office visit.  BP Readings from Last 3 Encounters:  08/06/20 104/60  07/23/20 132/74  07/21/20 136/73     2. Prediabetes Molly Bean has a diagnosis of prediabetes based on her elevated HgA1c and was informed this puts her at greater risk of developing diabetes. She continues to work on diet and exercise to decrease her risk of diabetes. She denies nausea or hypoglycemia.  She has lost 1 pound since last visit.  Plan:  Check A1c at next visit.  Continue weight loss via prudent nutritional plan.  If she does not lose weight at next office visit, will consider starting GLP-1.  Lab Results  Component Value Date   HGBA1C 6.1 (H) 04/15/2020   Lab Results  Component Value Date   INSULIN 21.9 04/15/2020     3. Chronic  constipation She says that Linzess really helped, which we started her on at last office visit.  She takes it every other day and has a bowel movement every other day.  Tolerating well without side effects.  Plan:  Continue Linzess.  Increase liquids and walk daily.     4. Other hyperlipidemia Molly Bean has hyperlipidemia and has been trying to improve her cholesterol levels with intensive lifestyle modification including a low saturated fat diet, exercise and weight loss. She denies any chest pain, claudication or myalgias.  LDL was elevated around 3 months ago.  Reminded her of need to reduce saturated/trans fats.  Plan:  Continue eating prudent nutritional plan, increase exercise.  Recheck labs after 5-10% of weight loss.  Lab Results  Component Value Date   ALT 14 04/15/2020   AST 14 04/15/2020   ALKPHOS 83 04/15/2020   BILITOT 0.3 04/15/2020   Lab Results  Component Value Date   CHOL 182 04/15/2020   HDL 58 04/15/2020   LDLCALC 107 (H) 04/15/2020   TRIG 91 04/15/2020   CHOLHDL 3 11/06/2010     5. Vitamin D deficiency Molly Bean's Vitamin D level was 21.9 on 04/15/2020. She is currently taking prescription vitamin D 50,000 IU each week. She denies nausea, vomiting or muscle weakness.  Tolerating well.  Plan:  Continue vitamin D 50,000 IU weekly.  Check vitamin D level at next office visit.   6. At risk for  heart disease Due to Molly Bean's current state of health and medical condition(s), she is at a higher risk for heart disease.   This puts the patient at much greater risk to subsequently develop cardiopulmonary conditions that can significantly affect patient's quality of life in a negative manner as well.    At least 10 minutes was spent on counseling Molly Bean about these concerns today and I stressed the importance of reversing risks factors of obesity, esp truncal and visceral fat, hypertension, hyperlipidemia, pre-diabetes.   Initial goal is to lose at least 5-10% of  starting weight to help reduce these risk factors.   Counseling: Intensive lifestyle modifications discussed with Molly Bean as most appropriate first line treatment.  she will continue to work on diet, exercise and weight loss efforts.  We will continue to reassess these conditions on a fairly regular basis in an attempt to decrease patient's overall morbidity and mortality.  Evidence-based interventions for health behavior change were utilized today including the discussion of self monitoring techniques, problem-solving barriers and SMART goal setting techniques.  Specifically regarding patient's less desirable eating habits and patterns, we employed the technique of small changes when Molly Bean has not been able to fully commit to her prudent nutritional plan.   7. Class 3 severe obesity with serious comorbidity and body mass index (BMI) of 40.0 to 44.9 in adult, unspecified obesity type (HCC)  Molly Bean is currently in the action stage of change. As such, her goal is to continue with weight loss efforts. She has agreed to the Category 1 Plan.   Exercise goals: Start walking for 15 minutes everyday.  Behavioral modification strategies: increasing water intake, meal planning and cooking strategies, keeping healthy foods in the home and planning for success.  Molly Bean has agreed to follow-up with our clinic in 2 weeks, and will check A1c and vitamin D. She was informed of the importance of frequent follow-up visits to maximize her success with intensive lifestyle modifications for her multiple health conditions.   Molly Bean was informed we would discuss her lab results at her next visit unless there is a critical issue that needs to be addressed sooner. Molly Bean agreed to keep her next visit at the agreed upon time to discuss these results.    Objective:   Blood pressure 104/60, pulse 74, temperature 98.1 F (36.7 C), height 5\' 2"  (1.575 m), weight 238 lb (108 kg), SpO2 98 %. Body mass index is  43.53 kg/m.  General: Cooperative, alert, well developed, in no acute distress. HEENT: Conjunctivae and lids unremarkable. Cardiovascular: Regular rhythm.  Lungs: Normal work of breathing. Neurologic: No focal deficits.   Lab Results  Component Value Date   CREATININE 0.72 04/15/2020   BUN 8 04/15/2020   NA 143 04/15/2020   K 4.3 04/15/2020   CL 103 04/15/2020   CO2 24 04/15/2020   Lab Results  Component Value Date   ALT 14 04/15/2020   AST 14 04/15/2020   ALKPHOS 83 04/15/2020   BILITOT 0.3 04/15/2020   Lab Results  Component Value Date   HGBA1C 6.1 (H) 04/15/2020   Lab Results  Component Value Date   INSULIN 21.9 04/15/2020   Lab Results  Component Value Date   TSH 1.100 04/15/2020   Lab Results  Component Value Date   CHOL 182 04/15/2020   HDL 58 04/15/2020   LDLCALC 107 (H) 04/15/2020   TRIG 91 04/15/2020   CHOLHDL 3 11/06/2010   Lab Results  Component Value Date   WBC 6.9 04/15/2020  HGB 12.1 04/15/2020   HCT 40.3 04/15/2020   MCV 70 (L) 04/15/2020   PLT 225 04/15/2020   Lab Results  Component Value Date   IRON 58 10/27/2018   Attestation Statements:   Reviewed by clinician on day of visit: allergies, medications, problem list, medical history, surgical history, family history, social history, and previous encounter notes.  I, Water quality scientist, CMA, am acting as Location manager for [employer], DO.  I have reviewed the above documentation for accuracy and completeness, and I agree with the above. -  *Marjory Sneddon, D.O.  The [place] was signed into law in 2016 which includes the topic of electronic health records.  This provides immediate access to information in MyChart.  This includes consultation notes, operative notes, office notes, lab results and pathology reports.  If you have any questions about what you read please let us know at your next visit so we can discuss your concerns and take corrective action if need be.  We  are right here with you.

## 2020-08-19 ENCOUNTER — Ambulatory Visit (INDEPENDENT_AMBULATORY_CARE_PROVIDER_SITE_OTHER): Payer: BC Managed Care – PPO | Admitting: Family Medicine

## 2020-08-19 ENCOUNTER — Encounter (INDEPENDENT_AMBULATORY_CARE_PROVIDER_SITE_OTHER): Payer: Self-pay | Admitting: Family Medicine

## 2020-08-19 ENCOUNTER — Other Ambulatory Visit: Payer: Self-pay

## 2020-08-19 VITALS — BP 124/75 | HR 59 | Temp 98.3°F | Ht 62.0 in | Wt 242.0 lb

## 2020-08-19 DIAGNOSIS — R7303 Prediabetes: Secondary | ICD-10-CM

## 2020-08-19 DIAGNOSIS — E559 Vitamin D deficiency, unspecified: Secondary | ICD-10-CM

## 2020-08-19 DIAGNOSIS — I10 Essential (primary) hypertension: Secondary | ICD-10-CM

## 2020-08-19 DIAGNOSIS — Z9189 Other specified personal risk factors, not elsewhere classified: Secondary | ICD-10-CM

## 2020-08-19 DIAGNOSIS — Z6841 Body Mass Index (BMI) 40.0 and over, adult: Secondary | ICD-10-CM

## 2020-08-19 DIAGNOSIS — K5909 Other constipation: Secondary | ICD-10-CM

## 2020-08-19 DIAGNOSIS — E66813 Obesity, class 3: Secondary | ICD-10-CM

## 2020-08-19 MED ORDER — VITAMIN D (ERGOCALCIFEROL) 1.25 MG (50000 UNIT) PO CAPS: 50000.0000 [IU] | ORAL_CAPSULE | ORAL | 0 refills | Status: DC

## 2020-08-20 LAB — VITAMIN D 25 HYDROXY (VIT D DEFICIENCY, FRACTURES): Vit D, 25-Hydroxy: 24.3 ng/mL — ABNORMAL LOW (ref 30.0–100.0)

## 2020-08-20 LAB — HEMOGLOBIN A1C
Est. average glucose Bld gHb Est-mCnc: 128 mg/dL
Hgb A1c MFr Bld: 6.1 % — ABNORMAL HIGH (ref 4.8–5.6)

## 2020-08-25 NOTE — Progress Notes (Signed)
Chief Complaint:   OBESITY Molly Bean is here to discuss her progress with her obesity treatment plan along with follow-up of her obesity related diagnoses. Molly Bean is on the Category 1 Plan and states she is following her eating plan approximately 85% of the time. Molly Bean states she is walking for 30 minutes 7 times per week.  Today's visit was #: 8 Starting weight: 240 lbs Starting date: 04/15/2020 Today's weight: 242 lbs Today's date: 08/19/2020 Total lbs lost to date: +2 lbs Total lbs lost since last in-office visit: +4 lbs  Interim History: Molly Bean is now using the treadmill for 30 minutes 7 days per week and eating right.  She says she is not eating after 6 pm.  She has stopped using salt.  She says she is not skipping meals, measuring proteins, and not having any snacks.  She is drinking 5 bottles of water per day (20 ounce bottles).  She has gained 5 pounds of water weight today, but recently added pickles to her diet.  Assessment/Plan:   1. Prediabetes Molly Bean has a diagnosis of prediabetes based on her elevated HgA1c and was informed this puts her at greater risk of developing diabetes. She continues to work on diet and exercise to decrease her risk of diabetes. She denies nausea or hypoglycemia.  A1c 6.1, fasting insulin 21.9 on 04/15/2020.  Plan:   - I reiterated and again counseled patient on pathophysiology of the disease process of Pre-DM.     - Stressed importance of dietary and lifestyle modifications resulting in weight loss as first line.  - continue to decrease simple carbs; increase fiber and proteins  - Handouts provided at pt's request after education provided.  All concerns/questions addressed.    - Anticipatory guidance given.  Recheck A1c and fasting insulin level in approximately 3 months from last check or as deemed fit.   Will check A1c today.  Consider adding GLP-1 in the future.  Lab Results  Component Value Date   HGBA1C 6.1 (H) 04/15/2020    Lab Results  Component Value Date   INSULIN 21.9 04/15/2020   - Hemoglobin A1c    2. Essential hypertension Review: taking medications as instructed, no medication side effects noted, no chest pain on exertion, no dyspnea on exertion, no swelling of ankles.  Flood pressure is running 120s/60s at home.   - She is taking Norvasc and Maxzide.  Molly Bean is working on healthy weight loss and exercise to improve blood pressure control. We will watch for signs of hypotension as she continues her lifestyle modifications.  BP Readings from Last 3 Encounters:  08/19/20 124/75  08/06/20 104/60  07/23/20 132/74   - Hemoglobin A1c  3. Vitamin D deficiency Molly Bean's Vitamin D level was 21.9 on 04/15/2020. She is currently taking prescription vitamin D 50,000 IU each week. She denies nausea, vomiting or muscle weakness.  Plan:  Check vitamin D level today.  Refill vitamin D, as per below.  -Refill Vitamin D, Ergocalciferol, (DRISDOL) 1.25 MG (50000 UNIT) CAPS capsule; Take 1 capsule (50,000 Units total) by mouth every 7 (seven) days.  Dispense: 4 capsule; Refill: 0 - VITAMIN D 25 Hydroxy (Vit-D Deficiency, Fractures)  4. Other constipation Well controlled with taking Linzess every other day.  Doing well.   5. At risk for dehydration Molly Bean is at higher than average risk of dehydration.  Molly Bean was given more than 9 minutes of proper hydration counseling today.  We discussed the signs and symptoms of dehydration, some of which  may include muscle cramping, constipation, or even orthostatic symptoms.  Counseling on the prevention of dehydration was also provided today.  Molly Bean is at risk for dehydration due to weight loss, lifestyle and behavorial habits, and possibly due to taking certain medication(s).  She was encouraged to adequately hydrate and monitor fluid status to avoid dehydration as well as weight loss plateaus.  Unless pre-existing renal or cardiopulmonary conditions exist, which  pt was told to limit their fluid intake.  I recommended roughly one half of their weight in pounds to be the approximate ounces of non-caloric, non-caffeinated beverages they should drink per day; including more if they are engaging in exercise.  6. Class 3 severe obesity with serious comorbidity and body mass index (BMI) of 40.0 to 44.9 in adult, unspecified obesity type (HCC)  Molly Bean is currently in the action stage of change. As such, her goal is to continue with weight loss efforts. She has agreed to the Category 1 Plan.   Exercise goals: As is.  Behavioral modification strategies:  Increase water intake to 1/2 her weight in ounces of water per day.  Molly Bean has agreed to follow-up with our clinic in 2-3 weeks.  Will review A1c and vitamin D results at that time. She was informed of the importance of frequent follow-up visits to maximize her success with intensive lifestyle modifications for her multiple health conditions.   Molly Bean was informed we would discuss her lab results at her next visit unless there is a critical issue that needs to be addressed sooner. Molly Bean agreed to keep her next visit at the agreed upon time to discuss these results.  Objective:   Blood pressure 124/75, pulse (!) 59, temperature 98.3 F (36.8 C), height 5\' 2"  (1.575 m), weight 242 lb (109.8 kg), SpO2 99 %. Body mass index is 44.26 kg/m.  General: Cooperative, alert, well developed, in no acute distress. HEENT: Conjunctivae and lids unremarkable. Cardiovascular: Regular rhythm.  Lungs: Normal work of breathing. Neurologic: No focal deficits.   Lab Results  Component Value Date   CREATININE 0.72 04/15/2020   BUN 8 04/15/2020   NA 143 04/15/2020   K 4.3 04/15/2020   CL 103 04/15/2020   CO2 24 04/15/2020   Lab Results  Component Value Date   ALT 14 04/15/2020   AST 14 04/15/2020   ALKPHOS 83 04/15/2020   BILITOT 0.3 04/15/2020   Lab Results  Component Value Date   HGBA1C 6.1 (H)  08/19/2020   HGBA1C 6.1 (H) 04/15/2020   Lab Results  Component Value Date   INSULIN 21.9 04/15/2020   Lab Results  Component Value Date   TSH 1.100 04/15/2020   Lab Results  Component Value Date   CHOL 182 04/15/2020   HDL 58 04/15/2020   LDLCALC 107 (H) 04/15/2020   TRIG 91 04/15/2020   CHOLHDL 3 11/06/2010   Lab Results  Component Value Date   WBC 6.9 04/15/2020   HGB 12.1 04/15/2020   HCT 40.3 04/15/2020   MCV 70 (L) 04/15/2020   PLT 225 04/15/2020   Lab Results  Component Value Date   IRON 58 10/27/2018   Attestation Statements:   Reviewed by clinician on day of visit: allergies, medications, problem list, medical history, surgical history, family history, social history, and previous encounter notes.  I, Water quality scientist, CMA, am acting as Location manager for [employer], DO.  I have reviewed the above documentation for accuracy and completeness, and I agree with the above. Marjory Sneddon, D.O.  The [place] was signed into law in 2016 which includes the topic of electronic health records.  This provides immediate access to information in MyChart.  This includes consultation notes, operative notes, office notes, lab results and pathology reports.  If you have any questions about what you read please let us know at your next visit so we can discuss your concerns and take corrective action if need be.  We are right here with you.

## 2020-09-06 IMAGING — DX DG FINGER THUMB 2+V*R*
3 series · 3 of 3 positions shown · non-contrast
Comparison: None.

CLINICAL DATA: Right thumb pain for 2 weeks without injury.

EXAM:
RIGHT THUMB 2+V

[finger ap]
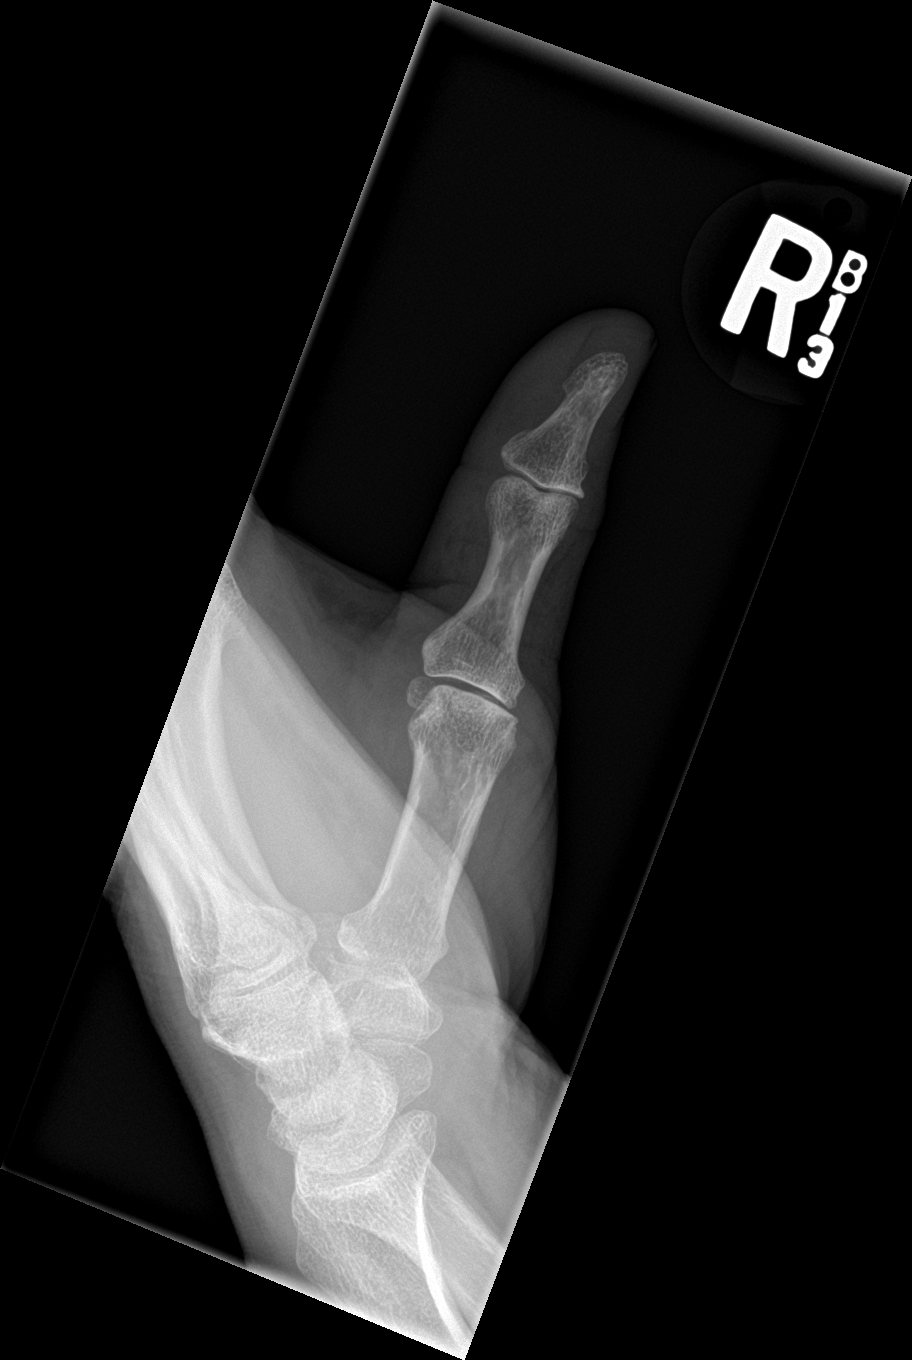

[finger obl]
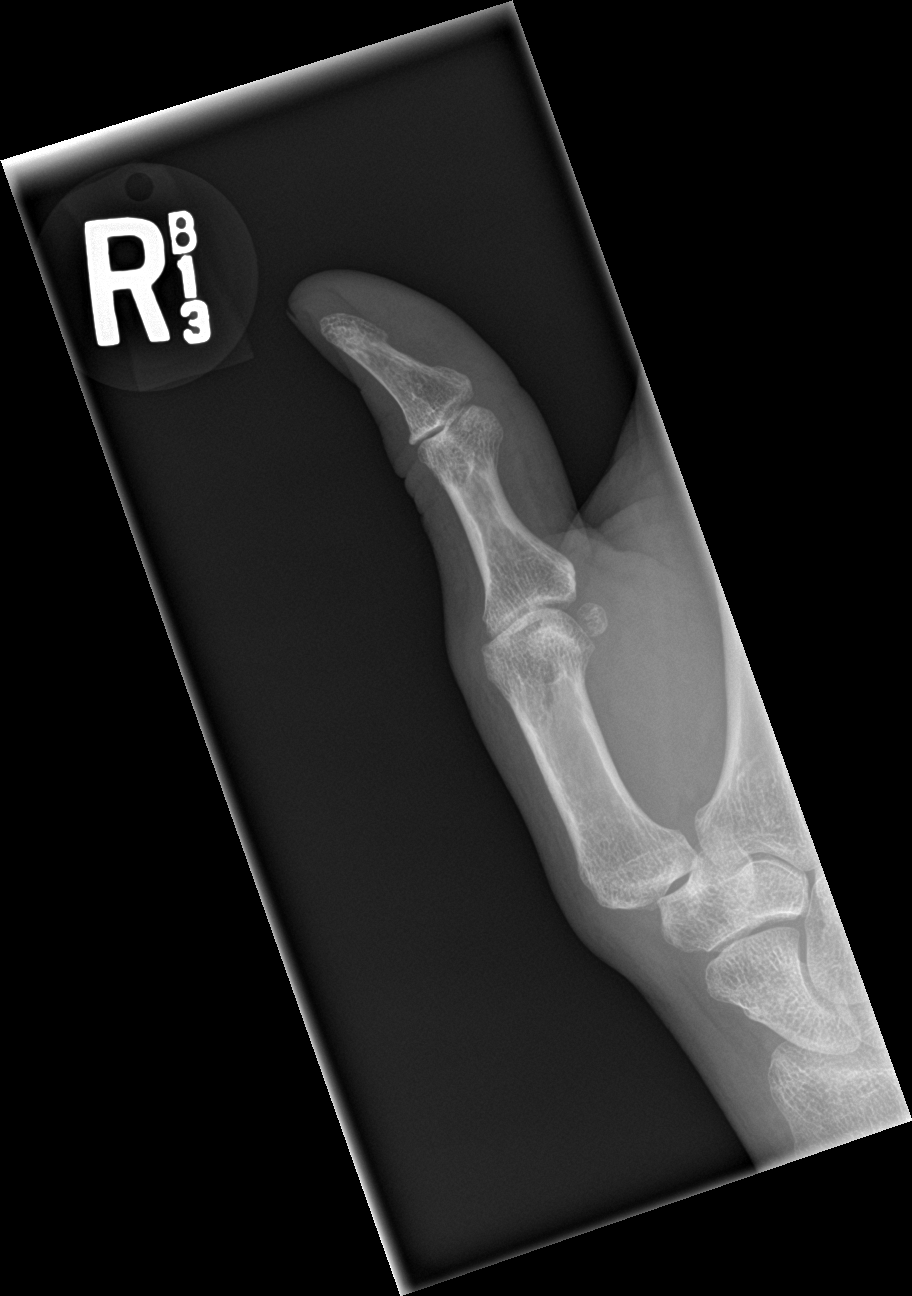

[finger lat]
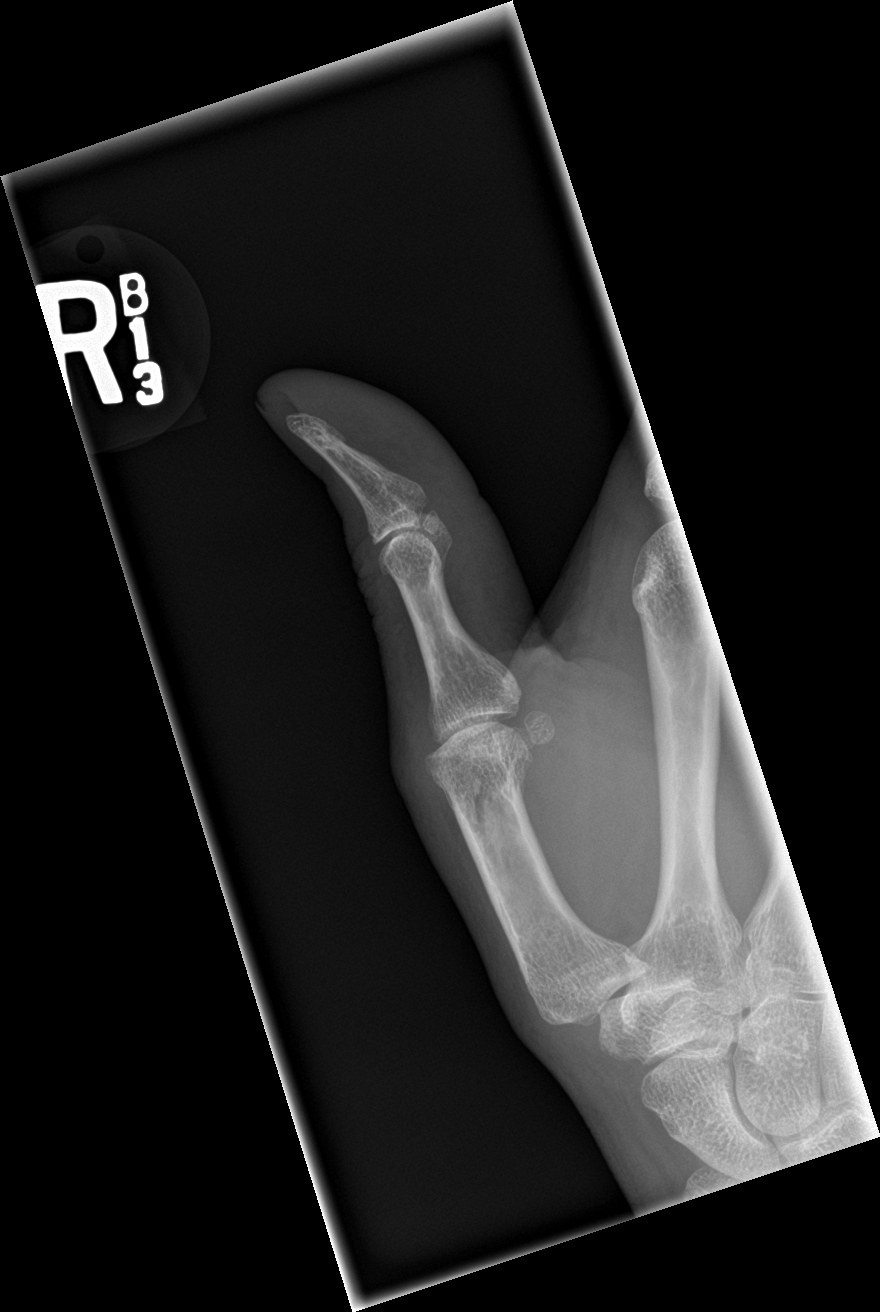

[3 of 3 positions shown; findings below may reference images not displayed]

FINDINGS: Mild degenerative changes in the first distal interphalangeal joint
with a tiny osteophyte. No fracture, dislocation, or bony lesion.
Soft tissues are normal.
IMPRESSION: Mild degenerative changes in the distal interphalangeal joint.

## 2020-09-08 ENCOUNTER — Ambulatory Visit (INDEPENDENT_AMBULATORY_CARE_PROVIDER_SITE_OTHER): Payer: BC Managed Care – PPO | Admitting: Family Medicine

## 2020-09-15 ENCOUNTER — Other Ambulatory Visit: Payer: Self-pay

## 2020-09-15 ENCOUNTER — Encounter: Payer: Self-pay | Admitting: Internal Medicine

## 2020-09-15 ENCOUNTER — Ambulatory Visit (INDEPENDENT_AMBULATORY_CARE_PROVIDER_SITE_OTHER): Payer: BC Managed Care – PPO | Admitting: Internal Medicine

## 2020-09-15 VITALS — BP 138/80 | HR 59 | Ht 62.0 in | Wt 243.4 lb

## 2020-09-15 DIAGNOSIS — Z6841 Body Mass Index (BMI) 40.0 and over, adult: Secondary | ICD-10-CM

## 2020-09-15 DIAGNOSIS — I10 Essential (primary) hypertension: Secondary | ICD-10-CM | POA: Diagnosis not present

## 2020-09-15 DIAGNOSIS — R0602 Shortness of breath: Secondary | ICD-10-CM | POA: Diagnosis not present

## 2020-09-15 DIAGNOSIS — R0683 Snoring: Secondary | ICD-10-CM | POA: Diagnosis not present

## 2020-09-15 NOTE — Patient Instructions (Signed)

## 2020-09-16 NOTE — Progress Notes (Signed)
Cardiology Office Note:    Date:  09/15/2020   ID:  Molly Bean, DOB 09-02-1961, MRN [id number]  PCP:  Marrian Salvage, [place]  Cardiologist:  Elouise Munroe, MD  Electrophysiologist:  None   Referring MD: Marrian Salvage,*   Chief Complaint/Reason for Referral: DOE  History of Present Illness:    Molly Bean is a 59 y.o. female with a history of anxiety and GERD, who presents for follow-up of shortness of breath.   She was diagnosed with sleep apnea and was initiated on CPAP. Her shortness of breath has resolved with treatment of OSA. She wakes up refreshed and feels much better. We reviewed again mechanism of treated sleep apnea in overall cardiovascular health.  She has enjoyed working with the healthy weight [facility] wellness center but is not sure why her weight has increased.  She has had significant constipation on a high-protein diet and was started on Linzess which has helped.  Would recommend increasing her soluble fiber intake.  Continues to have mild shortness of breath and we discussed the role of deconditioning in this symptom.  We discussed that if she uses her treadmill at home she should work at a moderate exertion and try to increase the incline of her treadmill for better cardiovascular exercise.   Past Medical History:  Diagnosis Date   Anxiety    Constipation    DEPRESSION    edema lower extremeties    GERD    Joint pain    MENOPAUSAL SYNDROME    Multiple food allergies    OBESITY    Shortness of breath    Sleep apnea    UTI'S, CHRONIC     Past Surgical History:  Procedure Laterality Date   ABDOMINAL HYSTERECTOMY  11/1998   total fibroids   CESAREAN SECTION      Current Medications: Current Meds  Medication Sig   acetaminophen (TYLENOL) 500 MG tablet Take 1,000 mg by mouth every 6 (six) hours as needed for mild pain or headache.   albuterol (VENTOLIN HFA) 108 (90 Base) MCG/ACT inhaler TAKE 2 PUFFS BY MOUTH  EVERY 6 HOURS AS NEEDED FOR WHEEZE OR SHORTNESS OF BREATH   amLODipine (NORVASC) 10 MG tablet Take 1 tablet (10 mg total) by mouth daily.   clindamycin (CLEOCIN T) 1 % lotion Apply 1 application topically 2 (two) times daily.   linaclotide (LINZESS) 145 MCG CAPS capsule 1-2 caps qd for constipation   methocarbamol (ROBAXIN) 500 MG tablet Take 1 tablet (500 mg total) by mouth every 8 (eight) hours as needed for muscle spasms.   mupirocin ointment (BACTROBAN) 2 % Apply bid to nasal passages as directed x 5 days   triamterene-hydrochlorothiazide (MAXZIDE-25) 37.5-25 MG tablet TAKE 0.5-1 TABLET BY MOUTH DAILY.   Vitamin D, Ergocalciferol, (DRISDOL) 1.25 MG (50000 UNIT) CAPS capsule Take 1 capsule (50,000 Units total) by mouth every 7 (seven) days.     Allergies:   Pennsaid [diclofenac sodium], Oxycodone, and Shellfish allergy   Social History   Tobacco Use   Smoking status: Former Smoker    Packs/day: 0.30    Years: 20.00    Pack years: 6.00    Types: Cigarettes    Quit date: 10/04/1977    Years since quitting: 42.9   Smokeless tobacco: Former Systems developer    Quit date: 10/05/1987  Vaping Use   Vaping Use: Never used  Substance Use Topics   Alcohol use: No   Drug use: No     Family History: The patient's  family history includes Atrial fibrillation in her mother; Breast cancer (age of onset: 88) in her sister; Coronary artery disease in her mother; Diabetes in her mother; Hypertension in her mother; Lupus in her cousin; Obesity in her mother; Seizures in her father; Sleep apnea in her mother; Stroke in her father.  ROS:   Please see the history of present illness.    All other systems reviewed and are negative.  EKGs/Labs/Other Studies Reviewed:    The following studies were reviewed today:  EKG:  Sinus brady rate 59  Recent Labs: 04/15/2020: ALT 14; BUN 8; Creatinine, Ser 0.72; Hemoglobin 12.1; Platelets 225; Potassium 4.3; Sodium 143; TSH 1.100  Recent Lipid Panel     Component Value Date/Time   CHOL 182 04/15/2020 1654   TRIG 91 04/15/2020 1654   HDL 58 04/15/2020 1654   CHOLHDL 3 11/06/2010 1638   VLDL 20.8 11/06/2010 1638   LDLCALC 107 (H) 04/15/2020 1654    Physical Exam:    VS:  BP 138/80    Pulse (!) 59    Ht 5\' 2"  (1.575 m)    Wt 243 lb 6.4 oz (110.4 kg)    SpO2 96%    BMI 44.52 kg/m     Wt Readings from Last 5 Encounters:  09/15/20 243 lb 6.4 oz (110.4 kg)  08/19/20 242 lb (109.8 kg)  08/06/20 238 lb (108 kg)  07/23/20 243 lb (110.2 kg)  07/21/20 239 lb (108.4 kg)    Constitutional: No acute distress Eyes: sclera non-icteric, normal conjunctiva and lids ENMT: normal dentition, moist mucous membranes Cardiovascular: regular rhythm, normal rate, no murmurs. S1 and S2 normal. Radial pulses normal bilaterally. No jugular venous distention.  Respiratory: clear to auscultation bilaterally GI : normal bowel sounds, soft and nontender. No distention.   MSK: extremities warm, well perfused. No edema.  NEURO: grossly nonfocal exam, moves all extremities. PSYCH: alert and oriented x 3, normal mood and affect.   ASSESSMENT:    1. Essential hypertension   2. SOB (shortness of breath)   3. Class 3 severe obesity due to excess calories with serious comorbidity and body mass index (BMI) of 40.0 to 44.9 in adult ([place])   4. Snoring    PLAN:    Essential hypertension - Plan: EKG 12-Lead -She continues on amlodipine 10 mg daily and triamterene HCTZ.  She has acceptable blood pressure control today and with weight loss this should continue to improve.  Continue current therapy.  SOB (shortness of breath) -She has had normal ischemic testing with a stress echo in 2019, no worrisome findings on a cardiac monitor with rare PVCs and PACs, recently normal echocardiogram, and improvement in shortness of breath with treatment of sleep apnea.  No further cardiac testing is required at this time, we discussed that this may be contributed to by  deconditioning and have discussed exercise strategies.  Class 3 severe obesity due to excess calories with serious comorbidity and body mass index (BMI) of 40.0 to 44.9 in adult [facility]) -Following with healthy [facility] wellness center.  She inquired if she can use a weight loss supplement.  I have cautioned her that this should be reviewed in detail with her obesity specialist given her history of palpitations.  If weight loss can be achieved without stimulant supplement, that would be my preference, however I defer to the healthy weight and wellness center for further discussion.  Snoring -Uses CPAP, has significant benefit.  Cardiovascular risk factors include prediabetes with hemoglobin A1c of  6.1% and LDL above goal at 107.  She should have her lipids rechecked at her next PCP follow-up, otherwise we will perform this in conjunction with our follow-up.  With weight loss and exercise if this has not reached close to the goal of LDL less than 70, we will institute low-dose statin therapy.  Total time of encounter: 30 minutes total time of encounter, including 20 minutes spent in face-to-face patient care on the date of this encounter. This time includes coordination of care and counseling regarding above mentioned problem list. Remainder of non-face-to-face time involved reviewing chart documents/testing relevant to the patient encounter and documentation in the medical record. I have independently reviewed documentation from referring provider.   Cherlynn Kaiser, MD [place]   [facility]    Medication Adjustments/Labs and Tests Ordered: Current medicines are reviewed at length with the patient today.  Concerns regarding medicines are outlined above.   Orders Placed This Encounter  Procedures   EKG 12-Lead    No orders of the defined types were placed in this encounter.   Patient Instructions  Medication Instructions:  No Changes In Medications at this time.  *If you need a  refill on your cardiac medications before your next appointment, please call your pharmacy*  Follow-Up: At [facility], you and your health needs are our priority.  As part of our continuing mission to provide you with exceptional heart care, we have created designated Provider Care Teams.  These Care Teams include your primary Cardiologist (physician) and Advanced Practice Providers (APPs -  Physician Assistants and Nurse Practitioners) who all work together to provide you with the care you need, when you need it.  Your next appointment:   6 month(s)  The format for your next appointment:   In Person  Provider:   Cherlynn Kaiser, MD

## 2020-09-17 ENCOUNTER — Ambulatory Visit (INDEPENDENT_AMBULATORY_CARE_PROVIDER_SITE_OTHER): Payer: BC Managed Care – PPO | Admitting: Family Medicine

## 2020-09-17 ENCOUNTER — Other Ambulatory Visit: Payer: Self-pay

## 2020-09-17 ENCOUNTER — Encounter (INDEPENDENT_AMBULATORY_CARE_PROVIDER_SITE_OTHER): Payer: Self-pay | Admitting: Family Medicine

## 2020-09-17 VITALS — BP 101/64 | HR 93 | Temp 98.2°F | Ht 62.0 in | Wt 239.0 lb

## 2020-09-17 DIAGNOSIS — K5909 Other constipation: Secondary | ICD-10-CM

## 2020-09-17 DIAGNOSIS — E559 Vitamin D deficiency, unspecified: Secondary | ICD-10-CM

## 2020-09-17 DIAGNOSIS — R7303 Prediabetes: Secondary | ICD-10-CM | POA: Diagnosis not present

## 2020-09-17 DIAGNOSIS — Z9189 Other specified personal risk factors, not elsewhere classified: Secondary | ICD-10-CM

## 2020-09-17 DIAGNOSIS — Z6841 Body Mass Index (BMI) 40.0 and over, adult: Secondary | ICD-10-CM

## 2020-09-17 MED ORDER — LINACLOTIDE 145 MCG PO CAPS: 145.0000 ug | ORAL_CAPSULE | Freq: Every day | ORAL | 0 refills | Status: DC

## 2020-09-17 MED ORDER — VITAMIN D (ERGOCALCIFEROL) 1.25 MG (50000 UNIT) PO CAPS: ORAL_CAPSULE | ORAL | 0 refills | Status: DC

## 2020-09-22 NOTE — Progress Notes (Signed)
Chief Complaint:   OBESITY Molly Bean is here to discuss her progress with her obesity treatment plan along with follow-up of her obesity related diagnoses. Molly Bean is on the Category 1 Plan and states she is following her eating plan approximately 90% of the time. Molly Bean states she is walking 30 minutes 5 times per week.  Today's visit was #: 9 Starting weight: 240 lbs Starting date: 04/15/2020 Today's weight: 239 lbs Today's date: 09/17/2020 Total lbs lost to date: 1 lb Total lbs lost since last in-office visit: 3 lbs Total weight loss percentage to date: -0.42%  Interim History: Molly Bean reports she is following the meal plan and walking 30 minutes 5 days a week and it's made all the difference. She notes more energy and is feeling great. She continues to drink 5 bottles of water a day.  Plan: Labs obtained last OV and discussed with patient today.  Assessment/Plan:   1. Other constipation Molly Bean takes Linzess daily and is now having bowel movements everyday.  Plan: Refill Linzess for 1 month, as per below.  Refill- linaclotide (LINZESS) 145 MCG CAPS capsule; Take 1 capsule (145 mcg total) by mouth daily before breakfast.  Dispense: 30 capsule; Refill: 0  2. Pre-diabetes Discussed labs with patient today.  Molly Bean has a diagnosis of prediabetes based on her elevated HgA1c and was informed this puts her at greater risk of developing diabetes. She continues to work on diet and exercise to decrease her risk of diabetes. She denies nausea or hypoglycemia.  Lab Results  Component Value Date   HGBA1C 6.1 (H) 08/19/2020   Lab Results  Component Value Date   INSULIN 21.9 04/15/2020   Plan: I discussed the disease process with Tavon. She declines GLP-1 medication today. Handout was given on Metformin as an alternative treatment and well discussed with patient.  3. Vitamin D deficiency Discussed labs with patient today. Molly Bean's Vitamin D level was 24.3 on  08/19/2020. She is currently taking prescription vitamin D 50,000 IU each week. She denies nausea, vomiting or muscle weakness.  Ref. Range 08/19/2020 09:43  Vitamin D, 25-Hydroxy Latest Ref Range: 30.0 - 100.0 ng/mL 24.3 (L)    Plan: Molly Bean's Vit D is not at goal. Increase medication to twice a week. New prescription sent in for increased dose   Refill with dose increase- Vitamin D, Ergocalciferol, (DRISDOL) 1.25 MG (50000 UNIT) CAPS capsule; 1 po q wed, and 1 po q sun  Dispense: 8 capsule; Refill: 0  4. At risk for diarrhea Molly Bean was given approximately 15 minutes of diarrhea prevention counseling today. She is 59 y.o. female and has risk factors for diarrhea including medications and changes in diet. We discussed intensive lifestyle modifications today with an emphasis on specific weight loss instructions including dietary strategies.   5. Class 3 severe obesity with serious comorbidity and body mass index (BMI) of 40.0 to 44.9 in adult, unspecified obesity type (HCC) Channon is currently in the action stage of change. As such, her goal is to continue with weight loss efforts. She has agreed to the Category 1 Plan.   Exercise goals: As is  Behavioral modification strategies: increasing lean protein intake, decreasing simple carbohydrates and planning for success.  Molly Bean has agreed to follow-up with our clinic in 3 weeks. She was informed of the importance of frequent follow-up visits to maximize her success with intensive lifestyle modifications for her multiple health conditions.   Objective:   Blood pressure 101/64, pulse 93, temperature 98.2 F (36.8 C),  height 5\' 2"  (1.575 m), weight 239 lb (108.4 kg), SpO2 96 %. Body mass index is 43.71 kg/m.  General: Cooperative, alert, well developed, in no acute distress. HEENT: Conjunctivae and lids unremarkable. Cardiovascular: Regular rhythm.  Lungs: Normal work of breathing. Neurologic: No focal deficits.   Lab Results   Component Value Date   CREATININE 0.72 04/15/2020   BUN 8 04/15/2020   NA 143 04/15/2020   K 4.3 04/15/2020   CL 103 04/15/2020   CO2 24 04/15/2020   Lab Results  Component Value Date   ALT 14 04/15/2020   AST 14 04/15/2020   ALKPHOS 83 04/15/2020   BILITOT 0.3 04/15/2020   Lab Results  Component Value Date   HGBA1C 6.1 (H) 08/19/2020   HGBA1C 6.1 (H) 04/15/2020   Lab Results  Component Value Date   INSULIN 21.9 04/15/2020   Lab Results  Component Value Date   TSH 1.100 04/15/2020   Lab Results  Component Value Date   CHOL 182 04/15/2020   HDL 58 04/15/2020   LDLCALC 107 (H) 04/15/2020   TRIG 91 04/15/2020   CHOLHDL 3 11/06/2010   Lab Results  Component Value Date   WBC 6.9 04/15/2020   HGB 12.1 04/15/2020   HCT 40.3 04/15/2020   MCV 70 (L) 04/15/2020   PLT 225 04/15/2020   Lab Results  Component Value Date   IRON 58 10/27/2018    Attestation Statements:   Reviewed by clinician on day of visit: allergies, medications, problem list, medical history, surgical history, family history, social history, and previous encounter notes.  Coral Ceo, am acting as Location manager for [employer], DO.  I have reviewed the above documentation for accuracy and completeness, and I agree with the above. Marjory Sneddon, D.O.  The [place] was signed into law in 2016 which includes the topic of electronic health records.  This provides immediate access to information in MyChart.  This includes consultation notes, operative notes, office notes, lab results and pathology reports.  If you have any questions about what you read please let us know at your next visit so we can discuss your concerns and take corrective action if need be.  We are right here with you.

## 2020-10-08 ENCOUNTER — Encounter: Payer: Self-pay | Admitting: Family

## 2020-10-09 ENCOUNTER — Other Ambulatory Visit: Payer: Self-pay | Admitting: Family

## 2020-10-09 DIAGNOSIS — R32 Unspecified urinary incontinence: Secondary | ICD-10-CM

## 2020-10-14 ENCOUNTER — Encounter (INDEPENDENT_AMBULATORY_CARE_PROVIDER_SITE_OTHER): Payer: Self-pay | Admitting: Family Medicine

## 2020-10-14 ENCOUNTER — Ambulatory Visit (INDEPENDENT_AMBULATORY_CARE_PROVIDER_SITE_OTHER): Payer: BC Managed Care – PPO | Admitting: Family Medicine

## 2020-10-14 ENCOUNTER — Other Ambulatory Visit: Payer: Self-pay

## 2020-10-14 VITALS — BP 123/76 | HR 67 | Temp 98.0°F | Ht 62.0 in | Wt 241.0 lb

## 2020-10-14 DIAGNOSIS — K5909 Other constipation: Secondary | ICD-10-CM

## 2020-10-14 DIAGNOSIS — Z9189 Other specified personal risk factors, not elsewhere classified: Secondary | ICD-10-CM | POA: Diagnosis not present

## 2020-10-14 DIAGNOSIS — E559 Vitamin D deficiency, unspecified: Secondary | ICD-10-CM | POA: Diagnosis not present

## 2020-10-14 DIAGNOSIS — R7303 Prediabetes: Secondary | ICD-10-CM

## 2020-10-14 DIAGNOSIS — Z6841 Body Mass Index (BMI) 40.0 and over, adult: Secondary | ICD-10-CM

## 2020-10-14 MED ORDER — VITAMIN D (ERGOCALCIFEROL) 1.25 MG (50000 UNIT) PO CAPS: ORAL_CAPSULE | ORAL | 0 refills | Status: DC

## 2020-10-14 MED ORDER — METFORMIN HCL 500 MG PO TABS: 500.0000 mg | ORAL_TABLET | Freq: Every day | ORAL | 0 refills | Status: DC

## 2020-10-16 NOTE — Progress Notes (Signed)
Chief Complaint:   OBESITY Molly Bean is here to discuss her progress with her obesity treatment plan along with follow-up of her obesity related diagnoses. Molly Bean is on the Category 1 Plan and states she is following her eating plan approximately 60% of the time. Molly Bean states she is walking 15 minutes 5 times per week.  Today's visit was #: 10 Starting weight: 240 lbs Starting date: 04/15/2020 Today's weight: 241 lbs Today's date: 10/14/2020 Total lbs lost to date: +1 lb Total lbs lost since last in-office visit: +2 lbs Total weight loss percentage to date: 0.42%  Interim History: Molly Bean reports the only thing she has changed was adding oatmeal in the morning and she didn't eat all her proteins.  She also decreased length of walking from 30 minutes to 15 minutes.  Assessment/Plan:   1. Pre-diabetes Molly Bean has a diagnosis of prediabetes based on her elevated HgA1c and was informed this puts her at greater risk of developing diabetes. She continues to work on diet and exercise to decrease her risk of diabetes. She denies nausea or hypoglycemia.  Lab Results  Component Value Date   HGBA1C 6.1 (H) 08/19/2020   Lab Results  Component Value Date   INSULIN 21.9 04/15/2020   Plan:  -Various medications for weight loss and also prediabetes was discussed with the patient today.  Due to concerns for cost patient declined a GLP-1 today Start Metformin 500 mg. Begin with 1/2 tab in the morning after food.  - Check A1c, magnesium, B12, and BMP after 3 months. - Molly Bean will continue to work on weight loss, exercise, and decreasing simple carbohydrates to help decrease the risk of diabetes.   Start- metFORMIN (GLUCOPHAGE) 500 MG tablet; Take 1 tablet (500 mg total) by mouth daily with breakfast.  Dispense: 30 tablet; Refill: 0    2. Other constipation At Zylah's last OV, we started patient on Linzess. She is tolerating it well without side effects. She loves it.  Plan:   Molly Bean was informed that a decrease in bowel movement frequency is normal while losing weight, but stools should not be hard or painful.   - Continue Linzess.  - Continue drinking 5 bottles or more of water daily.  - Adequate daily water intake encouraged along with activity/ movement  - Bowel habits and good bowel hygiene discussed extensively with patient  - F/up with your primary care provider if W or NI  Counseling-  Good Bowel Health: Your goal is to have one soft bowel movement each day. Drink at least 8 glasses of water each day or half of your weight in ounces of water per day unless otherwise noted by one of your doctors that you have resrictions.  Eat plenty of fiber (goal is over 25 grams each day).  It is best to get most of your fiber from dietary sources which includes leafy green vegetables, fresh fruit, and whole grains.  You may need to add fiber with the help of OTC fiber supplements.  These include the likes of Metamucil, Citrucel, Psyllium husks and Flaxseed.  If you are still having trouble, try adding Fleet's enemas or Magnesium Citrate. If all of these changes do not work, Cabin crew.   3. Vitamin D deficiency Molly Bean's Vitamin D level was 24.3 on 08/19/2020. She is currently taking prescription vitamin D 50,000 IU twice a week. She denies nausea, vomiting or muscle weakness.  Ref. Range 08/19/2020 09:43  Vitamin D, 25-Hydroxy Latest Ref Range: 30.0 - 100.0 ng/mL  24.3 (L)   Plan: Refill Vit D for 1 month, as per below. Low Vitamin D level contributes to fatigue and are associated with obesity, breast, and colon cancer. She agrees to continue to take prescription Vitamin D @50 ,000 IU every week and will follow-up for routine testing of Vitamin D, at least 2-3 times per year to avoid over-replacement. Check Vit D level with next blood draw.  Refill- Vitamin D, Ergocalciferol, (DRISDOL) 1.25 MG (50000 UNIT) CAPS capsule; 1 po q wed, and 1 po q sun  Dispense: 8  capsule; Refill: 0   4. At risk for diabetes mellitus Molly Bean was given approximately 10 minutes of diabetes education and counseling today. We discussed intensive lifestyle modifications today with an emphasis on weight loss as well as increasing exercise and decreasing simple carbohydrates in her diet. We also reviewed medication options with an emphasis on risk versus benefit of those discussed.    5. Class 3 severe obesity with serious comorbidity and body mass index (BMI) of 40.0 to 44.9 in adult, unspecified obesity type (HCC) Molly Bean is currently in the action stage of change. As such, her goal is to continue with weight loss efforts. She has agreed to keeping a food journal and adhering to recommended goals of 1000 calories and 80 g protein.   Exercise goals: Increase back to 30 minutes 5 days a week.  Behavioral modification strategies: increasing lean protein intake, decreasing simple carbohydrates, meal planning and cooking strategies, keeping healthy foods in the home and planning for success.  Molly Bean has agreed to follow-up with our clinic in 2 weeks. She was informed of the importance of frequent follow-up visits to maximize her success with intensive lifestyle modifications for her multiple health conditions.    Objective:   Blood pressure 123/76, pulse 67, temperature 98 F (36.7 C), height 5\' 2"  (1.575 m), weight 241 lb (109.3 kg), SpO2 99 %. Body mass index is 44.08 kg/m.  General: Cooperative, alert, well developed, in no acute distress. HEENT: Conjunctivae and lids unremarkable. Cardiovascular: Regular rhythm.  Lungs: Normal work of breathing. Neurologic: No focal deficits.   Lab Results  Component Value Date   CREATININE 0.72 04/15/2020   BUN 8 04/15/2020   NA 143 04/15/2020   K 4.3 04/15/2020   CL 103 04/15/2020   CO2 24 04/15/2020   Lab Results  Component Value Date   ALT 14 04/15/2020   AST 14 04/15/2020   ALKPHOS 83 04/15/2020   BILITOT 0.3  04/15/2020   Lab Results  Component Value Date   HGBA1C 6.1 (H) 08/19/2020   HGBA1C 6.1 (H) 04/15/2020   Lab Results  Component Value Date   INSULIN 21.9 04/15/2020   Lab Results  Component Value Date   TSH 1.100 04/15/2020   Lab Results  Component Value Date   CHOL 182 04/15/2020   HDL 58 04/15/2020   LDLCALC 107 (H) 04/15/2020   TRIG 91 04/15/2020   CHOLHDL 3 11/06/2010   Lab Results  Component Value Date   WBC 6.9 04/15/2020   HGB 12.1 04/15/2020   HCT 40.3 04/15/2020   MCV 70 (L) 04/15/2020   PLT 225 04/15/2020   Lab Results  Component Value Date   IRON 58 10/27/2018    Attestation Statements:   Reviewed by clinician on day of visit: allergies, medications, problem list, medical history, surgical history, family history, social history, and previous encounter notes.  Coral Ceo, am acting as Location manager for [employer], DO.  I have reviewed the  above documentation for accuracy and completeness, and I agree with the above. Marjory Sneddon, D.O.  The [place] was signed into law in 2016 which includes the topic of electronic health records.  This provides immediate access to information in MyChart.  This includes consultation notes, operative notes, office notes, lab results and pathology reports.  If you have any questions about what you read please let us know at your next visit so we can discuss your concerns and take corrective action if need be.  We are right here with you.

## 2020-10-22 NOTE — Progress Notes (Signed)
[facility] New Patient Evaluation and Consultation  Referring Provider: Marrian Salvage,* PCP: Marrian Salvage, [place] Date of Service: 10/24/2020  SUBJECTIVE Chief Complaint: New Patient (Initial Visit) (Dr Jasmine December referral urinary incontinence)  History of Present Illness: Molly Bean is a 60 y.o. Black or African-American female seen in consultation at the request of [facility] for evaluation of bladder leakage.    Review of records from [facility] significant for: History of bladder leakage since she had her hysterectomy in the year 2000.   Urinary Symptoms: Leaks urine with cough/ sneeze, going from sitting to standing, with a full bladder, with movement to the bathroom and with urgency. Has mostly on the way to the restroom. Leaks "often" Pad use: 3 liners/ mini-pads per day.   She is bothered by her UI symptoms.  Day time voids 5+.  Nocturia: 2 times per night to void. Voiding dysfunction: she empties her bladder well.  does not use a catheter to empty bladder.  When urinating, she feels she has no difficulties Drinks: 5 bottles Water with crystal light or other sweetener per day, green tea 3-4 times per week.   UTIs: 0 UTI's in the last year.   Denies history of blood in urine and kidney or bladder stones    Reports that during her hysterectomy ( in 2000) she had bladder injury and had to use a catheter.  Pelvic Organ Prolapse Symptoms:                  She Denies a feeling of a bulge the vaginal area.   Bowel Symptom: Bowel movements: 1 time(s) per day Stool consistency: soft  Straining: no.  Splinting: no.  Incomplete evacuation: no.  She Denies accidental bowel leakage / fecal incontinence Bowel regimen: miralax and Linzess Last colonoscopy: Date 2018, Results negative  Sexual Function Sexually active: yes.  Pain with sex: Yes, at the vaginal opening  Pelvic Pain Denies pelvic pain  Past Medical History:  Past Medical  History:  Diagnosis Date   Anxiety    Constipation    DEPRESSION    edema lower extremeties    GERD    Joint pain    MENOPAUSAL SYNDROME    Multiple food allergies    OBESITY    Shortness of breath    Sleep apnea      Past Surgical History:   Past Surgical History:  Procedure Laterality Date   ABDOMINAL HYSTERECTOMY  11/1998   total fibroids   CESAREAN SECTION       Past OB/GYN History: G1 P1  OB History  Gravida Para Term Preterm AB Living  1 1          SAB IAB Ectopic Multiple Live Births               # Outcome Date GA Lbr Len/2nd Weight Sex Delivery Anes PTL Lv  1 Para      CS-LTranv      Menopausal: Yes Contraception: n/a. Last pap smear was prior to hysterectomy.  Any history of abnormal pap smears: no.   Medications: She has a current medication list which includes the following prescription(s): acetaminophen, albuterol, clindamycin, linaclotide, metformin, methocarbamol, triamterene-hydrochlorothiazide, vitamin d (ergocalciferol), and amlodipine.   Allergies: Patient is allergic to pennsaid [diclofenac sodium], oxycodone, and shellfish allergy.   Social History:  Social History   Tobacco Use   Smoking status: Former Smoker    Packs/day: 0.30    Years: 20.00    Pack  years: 6.00    Types: Cigarettes    Quit date: 10/04/1977    Years since quitting: 43.0   Smokeless tobacco: Former Systems developer    Quit date: 10/05/1987  Vaping Use   Vaping Use: Never used  Substance Use Topics   Alcohol use: No   Drug use: No    Relationship status: married She lives with husband.   She is employed- [employer]. Regular exercise: No, but is starting with walking and weight and wellness   Family History:   Family History  Problem Relation Age of Onset   Coronary artery disease Mother    Hypertension Mother    Diabetes Mother    Atrial fibrillation Mother    Sleep apnea Mother    Obesity Mother    Seizures Father     Stroke Father    Lupus Cousin    Breast cancer Sister 48     Review of Systems: Review of Systems  Constitutional: Negative for fever, malaise/fatigue and weight loss.  Respiratory: Positive for shortness of breath. Negative for cough and wheezing.   Cardiovascular: Positive for leg swelling. Negative for chest pain and palpitations.  Gastrointestinal: Negative for abdominal pain and blood in stool.  Genitourinary: Negative for dysuria.       +hot flashes  Musculoskeletal: Negative for myalgias.  Skin: Negative for rash.  Neurological: Negative for dizziness and headaches.  Endo/Heme/Allergies: Does not bruise/bleed easily.  Psychiatric/Behavioral: Positive for depression. The patient is nervous/anxious.      OBJECTIVE Physical Exam: Vitals:   10/24/20 1105  BP: (!) 142/84  Pulse: 74  Weight: 241 lb (109.3 kg)  Height: 5\' 2"  (1.575 m)    Physical Exam Constitutional:      General: She is not in acute distress. Pulmonary:     Effort: Pulmonary effort is normal.  Abdominal:     General: There is no distension.     Palpations: Abdomen is soft.     Tenderness: There is no abdominal tenderness. There is no rebound.  Musculoskeletal:        General: No swelling. Normal range of motion.  Skin:    General: Skin is warm and dry.     Findings: No rash.  Neurological:     Mental Status: She is alert and oriented to person, place, and time.  Psychiatric:        Mood and Affect: Mood normal.        Behavior: Behavior normal.     GU / Detailed Urogynecologic Evaluation:  Pelvic Exam: Normal external female genitalia; Bartholin's and Skene's glands normal in appearance; urethral meatus normal in appearance, no urethral masses or discharge.   CST: negative    s/p hysterectomy: Speculum exam reveals normal vaginal mucosa without  atrophy and normal vaginal cuff.  Adnexa normal adnexa.    Pelvic floor strength III/V  Pelvic floor musculature: Tenderness of pubrectalis,  Right levator tender, Right obturator tender, Left levator tender, Left obturator tender  POP-Q:   POP-Q  -3                                            Aa   -3  Ba  -6                                              C   3                                            Gh  3                                            Pb  7                                            tvl   -2                                            Ap  -2                                            Bp                                                 D     Rectal Exam:  Normal external rectum  Post-Void Residual (PVR) by Bladder Scan: In order to evaluate bladder emptying, we discussed obtaining a postvoid residual and she agreed to this procedure.  Procedure: The ultrasound unit was placed on the patient's abdomen in the suprapubic region after the patient had voided. A PVR of 14 ml was obtained by bladder scan.  Laboratory Results: POC urine: + nitrites  I visualized the urine specimen, noting the specimen to be dark yellow  ASSESSMENT AND PLAN Ms. Hoffmeier is a 60 y.o. with:  1. Overactive bladder   2. Urinary frequency   3. SUI (stress urinary incontinence, female)   4. Levator spasm    1. OAB -We discussed the symptoms of overactive bladder (OAB), which include urinary urgency, urinary frequency, nocturia, with or without urge incontinence.  While we do not know the exact etiology of OAB, several treatment options exist. We discussed management including behavioral therapy (decreasing bladder irritants, urge suppression strategies, timed voids, bladder retraining), physical therapy, medication; for refractory cases posterior tibial nerve stimulation, sacral neuromodulation, and intravesical botulinum toxin injection.  - She would like to start with conservative therapies and pelvic floor PT. She will work on decreasing bladder irritants (list provided) and bladder  retraining. PT referral placed.   2. SUI For treatment of stress urinary incontinence,  non-surgical options include expectant management, weight loss, physical therapy, as well as a pessary.  Surgical options are also available.  - She will start with pelvic floor PT.   3. Urinary frequency - nitrites in urine  noted today, will send for culture to confirm UTI and prescribe antibiotic if necessary  4. Levator spasm The origin of pelvic floor muscle spasm can be multifactorial, including primary, reactive to a different pain source, trauma, or even part of a centralized pain syndrome. Treatment options include pelvic floor physical therapy, local (vaginal) or oral  muscle relaxants, pelvic muscle trigger point injections or centrally acting pain medications.   - She would like to proceed with pelvic PT to help relax the pelvic floor muscles.  Return 3 months to review progress.    Jaquita Folds, MD   Medical Decision Making:  - Reviewed/ ordered a clinical laboratory test - Review and summation of prior records

## 2020-10-24 ENCOUNTER — Ambulatory Visit (INDEPENDENT_AMBULATORY_CARE_PROVIDER_SITE_OTHER): Payer: BC Managed Care – PPO | Admitting: Obstetrics and Gynecology

## 2020-10-24 ENCOUNTER — Telehealth: Payer: Self-pay | Admitting: Licensed Clinical Social Worker

## 2020-10-24 ENCOUNTER — Encounter: Payer: Self-pay | Admitting: Obstetrics and Gynecology

## 2020-10-24 ENCOUNTER — Other Ambulatory Visit: Payer: Self-pay

## 2020-10-24 VITALS — BP 142/84 | HR 74 | Ht 62.0 in | Wt 241.0 lb

## 2020-10-24 DIAGNOSIS — R350 Frequency of micturition: Secondary | ICD-10-CM | POA: Diagnosis not present

## 2020-10-24 DIAGNOSIS — M62838 Other muscle spasm: Secondary | ICD-10-CM | POA: Diagnosis not present

## 2020-10-24 DIAGNOSIS — N3281 Overactive bladder: Secondary | ICD-10-CM | POA: Diagnosis not present

## 2020-10-24 DIAGNOSIS — N393 Stress incontinence (female) (male): Secondary | ICD-10-CM | POA: Diagnosis not present

## 2020-10-24 LAB — POCT URINALYSIS DIPSTICK
Appearance: NORMAL
Bilirubin, UA: NEGATIVE
Blood, UA: NEGATIVE
Glucose, UA: NEGATIVE
Ketones, UA: NEGATIVE
Leukocytes, UA: NEGATIVE
Nitrite, UA: POSITIVE
Protein, UA: NEGATIVE
Spec Grav, UA: 1.025 (ref 1.010–1.025)
Urobilinogen, UA: 0.2 E.U./dL
pH, UA: 6 (ref 5.0–8.0)

## 2020-10-24 NOTE — Telephone Encounter (Signed)
Pt presented to [facility] on 1/20, no appointment scheduled with cardiology on that date. At the office she expressed concerns to Bernardo Heater, clinic supervisor, and Hetty Ely, practice manager, regarding her husband Chrissie Noa who was currently meeting w/ Care Guide Amy. Pt concerns were then directed to LCSW (this Probation officer). I met with pt and she expressed concerns with pt behaviors and mood swings, she made this writer aware of pt husband previous legal hx and that she had concerns regarding care guide sessions. I confirmed that pt in no immediate fear for her safety at home. She shares her adult son is aware of her concerns and is a good support. LCSW provided information about [facility] and their services. She was accepting of resources and stated she would utilize them if needed.   Since pt concerns raised to LCSW and clinic staff involved safety, and another Care Navigation team member, I discussed them with clinic leadership and Care Navigation leadership.   LCSW received a call yesterday afternoon requesting call back to pt after I had left the office. This morning, 1/21, LCSW called pt back and re-introduced self, role, reason for call. Pt shared that she is aware Care Guide will not be seeing her husband anymore and was fired. I shared that Care Guide was not fired but due to safety concerns raised that there was a decision made regarding further appointments between Care Guide and team lead. I shared that since pt does not have a DPR stating that I can speak with her about his care that I cannot share any additional information about her husbands care here.   Pt states that she had told her husband she was seeing her doctor (no appointment noted on 1/20) and that her information that she had shared with me should not have been further discussed. This Probation officer shared that pt had made other clinic Merchant navy officer and clinic supervisor aware of concerns regarding  her husband, and that when there is safety concerns for a team member we have to share that with our team lead to determine how best to proceed. I let her know that our team lead will reach out to her husband to discuss any additional questions/concerns. Pt states understanding. Team Lead and LCSW, Raquel Sarna aware of this call.   Westley Hummer, MSW, [place]  [phone]

## 2020-10-24 NOTE — Patient Instructions (Signed)

## 2020-10-27 ENCOUNTER — Encounter (INDEPENDENT_AMBULATORY_CARE_PROVIDER_SITE_OTHER): Payer: Self-pay | Admitting: Family Medicine

## 2020-10-27 ENCOUNTER — Other Ambulatory Visit: Payer: Self-pay

## 2020-10-27 ENCOUNTER — Ambulatory Visit (INDEPENDENT_AMBULATORY_CARE_PROVIDER_SITE_OTHER): Payer: BC Managed Care – PPO | Admitting: Family Medicine

## 2020-10-27 VITALS — BP 120/80 | HR 87 | Temp 98.5°F | Ht 62.0 in | Wt 238.0 lb

## 2020-10-27 DIAGNOSIS — I10 Essential (primary) hypertension: Secondary | ICD-10-CM

## 2020-10-27 DIAGNOSIS — K5909 Other constipation: Secondary | ICD-10-CM | POA: Diagnosis not present

## 2020-10-27 DIAGNOSIS — Z9189 Other specified personal risk factors, not elsewhere classified: Secondary | ICD-10-CM

## 2020-10-27 DIAGNOSIS — E559 Vitamin D deficiency, unspecified: Secondary | ICD-10-CM

## 2020-10-27 DIAGNOSIS — R7303 Prediabetes: Secondary | ICD-10-CM | POA: Diagnosis not present

## 2020-10-27 DIAGNOSIS — Z6841 Body Mass Index (BMI) 40.0 and over, adult: Secondary | ICD-10-CM

## 2020-10-27 DIAGNOSIS — E66813 Obesity, class 3: Secondary | ICD-10-CM

## 2020-10-27 MED ORDER — LINACLOTIDE 145 MCG PO CAPS: 145.0000 ug | ORAL_CAPSULE | Freq: Every day | ORAL | 0 refills | Status: DC

## 2020-10-28 NOTE — Progress Notes (Signed)
Chief Complaint:   OBESITY Molly Bean is here to discuss her progress with her obesity treatment plan along with follow-up of her obesity related diagnoses. Molly Bean is on keeping a food journal and adhering to recommended goals of 1000 calories and 80 g protein but is following Category 1 and states she is following her eating plan approximately 90% of the time. Molly Bean states she is walking 30 minutes 5 times per week.  Today's visit was #: 11 Starting weight: 240 lbs Starting date: 04/15/2020 Today's weight: 238 lbs Today's date: 10/27/2020 Total lbs lost to date: 2 lbs Total lbs lost since last in-office visit: 3 lbs Total weight loss percentage to date: -0.83%  Interim History: Pt is walking more and thinks that has helped her lose weight this last time. She says she feels more energetic when she eats healthy and exercises. She wants to continue. She denies issues with hunger or cravings or the meal plan.  Assessment/Plan:   1. Pre-diabetes At last OV, we started pt on Metformin. She takes it every morning and denies side effects. She thinks it helps with hunger and helps her stay on the plan.   Lab Results  Component Value Date   HGBA1C 6.1 (H) 08/19/2020   Lab Results  Component Value Date   INSULIN 21.9 04/15/2020   Plan: Check fasting insulin and A1c in mid-Feb. Continue Metformin as written. Continue to increase protein and follow meal plan with increasing exercise.  2. Essential hypertension Pt is taking Norvasc and Maxzide. Her BP at home runs around 120/80 as well. She denies symptoms or new concerns.  BP Readings from Last 3 Encounters:  10/27/20 120/80  10/24/20 (!) 142/84  10/14/20 123/76   Plan: Continue current treatment plan. BP at goal. Continue to increase water intake, exercise, and decrease salt intake.  3. Other constipation Pt is taking Linzess. It's good, per pt. Constipation is well controlled and pt is making an effort to drink more water.  She feels it is helping with her GERD as well. She denies side effects and notes meds are working great. Continue prudent nutritional plan and adequate water intake. Refill Linzess for 1 month, as per below.  Refill- linaclotide (LINZESS) 145 MCG CAPS capsule; Take 1 capsule (145 mcg total) by mouth daily before breakfast.  Dispense: 30 capsule; Refill: 0  4. Vitamin D deficiency Molly Bean's Vitamin D level was 24.3 on 08/19/2020. She is currently taking prescription vitamin D 50,000 IU twice a week and tolerating it well. She denies nausea, vomiting or muscle weakness.   Ref. Range 08/19/2020 09:43  Vitamin D, 25-Hydroxy Latest Ref Range: 30.0 - 100.0 ng/mL 24.3 (L)   Plan: Pt denies need for refill today. Continue weight loss. Low Vitamin D level contributes to fatigue and are associated with obesity, breast, and colon cancer. She agrees to continue to take prescription Vitamin D @50 ,000 IU twice a week and will follow-up for routine testing of Vitamin D, at least 2-3 times per year to avoid over-replacement.  5. At risk for diarrhea Molly Bean was given approximately 9 minutes of diarrhea prevention counseling today. She is 60 y.o. female and has risk factors for diarrhea including medications and changes in diet. Also, once hydration status improves and activity increases, taking Linzess may cause diarrhea. We discussed intensive lifestyle modifications today with an emphasis on specific weight loss instructions including dietary strategies.   6. Class 3 severe obesity with serious comorbidity and body mass index (BMI) of 40.0 to 44.9  in adult, unspecified obesity type ([place]) Molly Bean is currently in the action stage of change. As such, her goal is to continue with weight loss efforts. She has agreed to the Category 1 Plan.   Exercise goals: As is  Behavioral modification strategies: increasing lean protein intake, no skipping meals, meal planning and cooking strategies, keeping healthy foods in  the home and planning for success.  Molly Bean has agreed to follow-up with our clinic in 3 weeks. She was informed of the importance of frequent follow-up visits to maximize her success with intensive lifestyle modifications for her multiple health conditions.   Objective:   Blood pressure 120/80, pulse 87, temperature 98.5 F (36.9 C), height 5\' 2"  (1.575 m), weight 238 lb (108 kg), SpO2 98 %. Body mass index is 43.53 kg/m.  General: Cooperative, alert, well developed, in no acute distress. HEENT: Conjunctivae and lids unremarkable. Cardiovascular: Regular rhythm.  Lungs: Normal work of breathing. Neurologic: No focal deficits.   Lab Results  Component Value Date   CREATININE 0.72 04/15/2020   BUN 8 04/15/2020   NA 143 04/15/2020   K 4.3 04/15/2020   CL 103 04/15/2020   CO2 24 04/15/2020   Lab Results  Component Value Date   ALT 14 04/15/2020   AST 14 04/15/2020   ALKPHOS 83 04/15/2020   BILITOT 0.3 04/15/2020   Lab Results  Component Value Date   HGBA1C 6.1 (H) 08/19/2020   HGBA1C 6.1 (H) 04/15/2020   Lab Results  Component Value Date   INSULIN 21.9 04/15/2020   Lab Results  Component Value Date   TSH 1.100 04/15/2020   Lab Results  Component Value Date   CHOL 182 04/15/2020   HDL 58 04/15/2020   LDLCALC 107 (H) 04/15/2020   TRIG 91 04/15/2020   CHOLHDL 3 11/06/2010   Lab Results  Component Value Date   WBC 6.9 04/15/2020   HGB 12.1 04/15/2020   HCT 40.3 04/15/2020   MCV 70 (L) 04/15/2020   PLT 225 04/15/2020   Lab Results  Component Value Date   IRON 58 10/27/2018    Attestation Statements:   Reviewed by clinician on day of visit: allergies, medications, problem list, medical history, surgical history, family history, social history, and previous encounter notes.  Coral Ceo, am acting as Location manager for [employer], DO.  I have reviewed the above documentation for accuracy and completeness, and I agree with the above. Marjory Sneddon, D.O.  The [place] was signed into law in 2016 which includes the topic of electronic health records.  This provides immediate access to information in MyChart.  This includes consultation notes, operative notes, office notes, lab results and pathology reports.  If you have any questions about what you read please let us know at your next visit so we can discuss your concerns and take corrective action if need be.  We are right here with you.

## 2020-10-30 ENCOUNTER — Telehealth: Payer: Self-pay | Admitting: Obstetrics and Gynecology

## 2020-10-30 DIAGNOSIS — N3000 Acute cystitis without hematuria: Secondary | ICD-10-CM

## 2020-10-30 LAB — URINE CULTURE

## 2020-10-30 MED ORDER — NITROFURANTOIN MONOHYD MACRO 100 MG PO CAPS: 100.0000 mg | ORAL_CAPSULE | Freq: Two times a day (BID) | ORAL | 0 refills | Status: DC

## 2020-10-30 NOTE — Telephone Encounter (Signed)
Left VM letting patient know I am sending her an antibiotic- macrobid 100mg  BID x 5days. Will send her a mychart message with more details.   Jaquita Folds, MD

## 2020-10-31 ENCOUNTER — Other Ambulatory Visit: Payer: Self-pay | Admitting: Obstetrics and Gynecology

## 2020-10-31 DIAGNOSIS — N3000 Acute cystitis without hematuria: Secondary | ICD-10-CM

## 2020-10-31 NOTE — Telephone Encounter (Signed)
Macrobid on backorder. Changed to bactrim DS BID x3 days.

## 2020-11-17 ENCOUNTER — Other Ambulatory Visit: Payer: Self-pay | Admitting: Occupational Medicine

## 2020-11-17 ENCOUNTER — Other Ambulatory Visit: Payer: Self-pay

## 2020-11-17 ENCOUNTER — Ambulatory Visit: Payer: Self-pay

## 2020-11-17 DIAGNOSIS — M25562 Pain in left knee: Secondary | ICD-10-CM

## 2020-11-19 ENCOUNTER — Ambulatory Visit (INDEPENDENT_AMBULATORY_CARE_PROVIDER_SITE_OTHER): Payer: BC Managed Care – PPO | Admitting: Family Medicine

## 2020-11-27 ENCOUNTER — Encounter (INDEPENDENT_AMBULATORY_CARE_PROVIDER_SITE_OTHER): Payer: Self-pay | Admitting: Bariatrics

## 2020-11-27 ENCOUNTER — Ambulatory Visit (INDEPENDENT_AMBULATORY_CARE_PROVIDER_SITE_OTHER): Payer: BC Managed Care – PPO | Admitting: Bariatrics

## 2020-11-27 ENCOUNTER — Other Ambulatory Visit: Payer: Self-pay

## 2020-11-27 VITALS — BP 125/82 | HR 85 | Temp 98.3°F | Ht 62.0 in | Wt 240.0 lb

## 2020-11-27 DIAGNOSIS — Z6841 Body Mass Index (BMI) 40.0 and over, adult: Secondary | ICD-10-CM | POA: Diagnosis not present

## 2020-11-27 DIAGNOSIS — E559 Vitamin D deficiency, unspecified: Secondary | ICD-10-CM | POA: Diagnosis not present

## 2020-11-27 DIAGNOSIS — Z9189 Other specified personal risk factors, not elsewhere classified: Secondary | ICD-10-CM | POA: Diagnosis not present

## 2020-11-27 DIAGNOSIS — K5909 Other constipation: Secondary | ICD-10-CM | POA: Diagnosis not present

## 2020-11-27 MED ORDER — VITAMIN D (ERGOCALCIFEROL) 1.25 MG (50000 UNIT) PO CAPS: ORAL_CAPSULE | ORAL | 0 refills | Status: DC

## 2020-11-27 MED ORDER — LINACLOTIDE 145 MCG PO CAPS: 145.0000 ug | ORAL_CAPSULE | Freq: Every day | ORAL | 0 refills | Status: DC

## 2020-12-01 ENCOUNTER — Encounter (INDEPENDENT_AMBULATORY_CARE_PROVIDER_SITE_OTHER): Payer: Self-pay | Admitting: Bariatrics

## 2020-12-01 NOTE — Progress Notes (Signed)
Chief Complaint:   OBESITY Molly Bean is here to discuss her progress with her obesity treatment plan along with follow-up of her obesity related diagnoses. Molly Bean is on the Category 1 Plan and states she is following her eating plan approximately 90% of the time. Molly Bean states she is doing 0 minutes 0 times per week.  Today's visit was #: 12 Starting weight: 240 lbs Starting date:04/15/2020 Today's weight: 240 lbs Today's date: 11/27/2020 Total lbs lost to date: 0 Total lbs lost since last in-office visit: 0  Interim History: Molly Bean is up 2 lbs since her last visit. She is still doing well with her food. She is up about 1 1/2 lbs of water weight.  Subjective:   1. Vitamin D deficiency Molly Bean's Vitamin D level was 24.3 on 08/19/2020. She is currently taking prescription vitamin D 50,000 IU each week. She denies nausea, vomiting or muscle weakness.   Ref. Range 08/19/2020 09:43  Vitamin D, 25-Hydroxy Latest Ref Range: 30.0 - 100.0 ng/mL 24.3 (L)   2. Other constipation Molly Bean is taking Linzess and notes that it is helpful.  3. At risk for osteoporosis Molly Bean is at higher risk of osteopenia and osteoporosis due to Vitamin D deficiency.   Assessment/Plan:   1. Vitamin D deficiency Low Vitamin D level contributes to fatigue and are associated with obesity, breast, and colon cancer. She agrees to continue to take prescription Vitamin D @50 ,000 IU every week and will follow-up for routine testing of Vitamin D, at least 2-3 times per year to avoid over-replacement.  - Vitamin D, Ergocalciferol, (DRISDOL) 1.25 MG (50000 UNIT) CAPS capsule; 1 po q wed, and 1 po q sun  Dispense: 8 capsule; Refill: 0  2. Other constipation Molly Bean was informed that Bean decrease in bowel movement frequency is normal while losing weight, but stools should not be hard or painful. Orders and follow up as documented in patient record.   Counseling Getting to Good Bowel Health: Your goal is to  have one soft bowel movement each day. Drink at least 8 glasses of water each day. Eat plenty of fiber (goal is over 25 grams each day). It is best to get most of your fiber from dietary sources which includes leafy green vegetables, fresh fruit, and whole grains. You may need to add fiber with the help of OTC fiber supplements. These include Metamucil, Citrucel, and Flaxseed. If you are still having trouble, try adding Miralax or Magnesium Citrate. If all of these changes do not work, Cabin [employer].  - linaclotide (LINZESS) 145 MCG CAPS capsule; Take 1 capsule (145 mcg total) by mouth daily before breakfast.  Dispense: 30 capsule; Refill: 0  3. At risk for osteoporosis Molly Bean was given approximately 15 minutes of osteoporosis prevention counseling today. Molly Bean is at risk for osteopenia and osteoporosis due to her Vitamin D deficiency. She was encouraged to take her Vitamin D and follow her higher calcium diet and increase strengthening exercise to help strengthen her bones and decrease her risk of osteopenia and osteoporosis.  Repetitive spaced learning was employed today to elicit superior memory formation and behavioral change.  4. Class 3 severe obesity due to excess calories with serious comorbidity and body mass index (BMI) of 40.0 to 44.9 in adult [facility]) Molly Bean is currently in the action stage of change. As such, her goal is to continue with weight loss efforts. She has agreed to the Category 1 Plan.   Meal planning Intentional eating Increase water and protein intake  Exercise goals: [place] fell at work and will see orthopedics next week (landed on left knee, more sedentary)  Behavioral modification strategies: increasing lean protein intake, decreasing simple carbohydrates, increasing vegetables, increasing water intake, decreasing eating out, no skipping meals, meal planning and cooking strategies, keeping healthy foods in the home and planning for success.  Molly Bean  has agreed to follow-up with our clinic in 2-3 weeks with Dr. Raliegh Scarlet. She was informed of the importance of frequent follow-up visits to maximize her success with intensive lifestyle modifications for her multiple health conditions.   Objective:   Blood pressure 125/82, pulse 85, temperature 98.3 F (36.8 C), height 5\' 2"  (1.575 m), weight 240 lb (108.9 kg), SpO2 100 %. Body mass index is 43.9 kg/m.  General: Cooperative, alert, well developed, in no acute distress. HEENT: Conjunctivae and lids unremarkable. Cardiovascular: Regular rhythm.  Lungs: Normal work of breathing. Neurologic: No focal deficits.   Lab Results  Component Value Date   CREATININE 0.72 04/15/2020   BUN 8 04/15/2020   NA 143 04/15/2020   K 4.3 04/15/2020   CL 103 04/15/2020   CO2 24 04/15/2020   Lab Results  Component Value Date   ALT 14 04/15/2020   AST 14 04/15/2020   ALKPHOS 83 04/15/2020   BILITOT 0.3 04/15/2020   Lab Results  Component Value Date   HGBA1C 6.1 (H) 08/19/2020   HGBA1C 6.1 (H) 04/15/2020   Lab Results  Component Value Date   INSULIN 21.9 04/15/2020   Lab Results  Component Value Date   TSH 1.100 04/15/2020   Lab Results  Component Value Date   CHOL 182 04/15/2020   HDL 58 04/15/2020   LDLCALC 107 (H) 04/15/2020   TRIG 91 04/15/2020   CHOLHDL 3 11/06/2010   Lab Results  Component Value Date   WBC 6.9 04/15/2020   HGB 12.1 04/15/2020   HCT 40.3 04/15/2020   MCV 70 (L) 04/15/2020   PLT 225 04/15/2020   Lab Results  Component Value Date   IRON 58 10/27/2018    Attestation Statements:   Reviewed by clinician on day of visit: allergies, medications, problem list, medical history, surgical history, family history, social history, and previous encounter notes.  Coral Ceo, am acting as Location manager for [employer], DO.  I have reviewed the above documentation for accuracy and completeness, and I agree with the above. Jearld Lesch, DO

## 2020-12-10 ENCOUNTER — Other Ambulatory Visit: Payer: Self-pay

## 2020-12-10 ENCOUNTER — Ambulatory Visit (INDEPENDENT_AMBULATORY_CARE_PROVIDER_SITE_OTHER): Payer: BC Managed Care – PPO | Admitting: Family Medicine

## 2020-12-10 ENCOUNTER — Encounter (INDEPENDENT_AMBULATORY_CARE_PROVIDER_SITE_OTHER): Payer: Self-pay | Admitting: Family Medicine

## 2020-12-10 VITALS — BP 117/75 | HR 81 | Temp 98.9°F | Ht 62.0 in | Wt 240.0 lb

## 2020-12-10 DIAGNOSIS — E7849 Other hyperlipidemia: Secondary | ICD-10-CM | POA: Diagnosis not present

## 2020-12-10 DIAGNOSIS — Z6841 Body Mass Index (BMI) 40.0 and over, adult: Secondary | ICD-10-CM

## 2020-12-10 DIAGNOSIS — D509 Iron deficiency anemia, unspecified: Secondary | ICD-10-CM

## 2020-12-10 DIAGNOSIS — Z9189 Other specified personal risk factors, not elsewhere classified: Secondary | ICD-10-CM | POA: Insufficient documentation

## 2020-12-10 DIAGNOSIS — R7303 Prediabetes: Secondary | ICD-10-CM | POA: Diagnosis not present

## 2020-12-10 DIAGNOSIS — E559 Vitamin D deficiency, unspecified: Secondary | ICD-10-CM

## 2020-12-10 DIAGNOSIS — I10 Essential (primary) hypertension: Secondary | ICD-10-CM

## 2020-12-10 MED ORDER — METFORMIN HCL 500 MG PO TABS: 500.0000 mg | ORAL_TABLET | Freq: Two times a day (BID) | ORAL | 0 refills | Status: DC

## 2020-12-19 ENCOUNTER — Other Ambulatory Visit (INDEPENDENT_AMBULATORY_CARE_PROVIDER_SITE_OTHER): Payer: Self-pay | Admitting: Bariatrics

## 2020-12-19 DIAGNOSIS — E559 Vitamin D deficiency, unspecified: Secondary | ICD-10-CM

## 2020-12-22 NOTE — Progress Notes (Signed)
Chief Complaint:   OBESITY Molly Bean is here to discuss her progress with her obesity treatment plan along with follow-up of her obesity related diagnoses.   Today's visit was #: 13 Starting weight: 240 lbs Starting date: 04/15/2020 Today's weight: 240 lbs Today's date: 12/10/2020 Total lbs lost to date: 0 Body mass index is 43.9 kg/m.   Interim History:  Molly Bean says her clothes feel looser and she feels like she is building muscle.  She is happy with her progress.  Molly Bean is here for a follow up office visit and she is following the meal plan without concerns or issues.  Patient's meal and food recall appears to be accurate and consistent with what is on the plan.  When on plan, her hunger and cravings are well controlled.    Current Meal Plan: the Category 1 Plan for 95% of the time.  Current Exercise Plan: None.  Assessment/Plan:   1. Prediabetes Not at goal. Goal is HgbA1c < 5.7.  Medication: metformin 500 mg daily.    Plan:  She will continue to focus on protein-rich, low simple carbohydrate foods. We reviewed the importance of hydration, regular exercise for stress reduction, and restorative sleep.  Increase metformin to 500 mg twice daily.  Lab Results  Component Value Date   HGBA1C 6.1 (H) 08/19/2020   Lab Results  Component Value Date   INSULIN 21.9 04/15/2020   - Increase and refill metFORMIN (GLUCOPHAGE) 500 MG tablet; Take 1 tablet (500 mg total) by mouth 2 (two) times daily with a meal.  Dispense: 60 tablet; Refill: 0 - Hemoglobin A1c - Insulin, random  2. Other hyperlipidemia Course: Improving, but not optimized. Lipid-lowering medications: None.  LDL is elevated.  Plan: Dietary changes: Increase soluble fiber, decrease simple carbohydrates, decrease saturated fat. Exercise changes: Moderate to vigorous-intensity aerobic activity 150 minutes per week or as tolerated. We will continue to monitor along with PCP/specialists as it pertains to her  weight loss journey.  Will check lipid panel at next office visit.  Lab Results  Component Value Date   CHOL 182 04/15/2020   HDL 58 04/15/2020   LDLCALC 107 (H) 04/15/2020   TRIG 91 04/15/2020   CHOLHDL 3 11/06/2010   Lab Results  Component Value Date   ALT 14 04/15/2020   AST 14 04/15/2020   ALKPHOS 83 04/15/2020   BILITOT 0.3 04/15/2020   The 10-year ASCVD risk score Molly Bussing DC Jr., et al., 2013) is: 5%   Values used to calculate the score:     Age: 57 years     Sex: Female     Is Non-Hispanic African American: Yes     Diabetic: No     Tobacco smoker: No     Systolic Blood Pressure: 818 mmHg     Is BP treated: Yes     HDL Cholesterol: 58 mg/dL     Total Cholesterol: 182 mg/dL  - Lipid panel  3. Vitamin D deficiency Not at goal. Current vitamin D is 24.3, tested on 08/19/2020. Optimal goal > 50 ng/dL.  She is taking vitamin D 50,000 IU twice weekly.  Plan: Continue to take prescription Vitamin D @50 ,000 IU twice a week as prescribed.  Will check vitamin D level at next visit.  - VITAMIN D 25 Hydroxy (Vit-D Deficiency, Fractures)  4. Iron deficiency anemia, unspecified iron deficiency anemia type Molly Bean is taking ferrous sulfate 325 mg daily.  No constipation.  Well controlled on Linzess.  Plan: Nutrition: Iron-rich foods  include dark leafy greens, red and white meats, eggs, seafood, and beans.  Certain foods and drinks prevent your body from absorbing iron properly. Avoid eating these foods in the same meal as iron-rich foods or with iron supplements. These foods include: coffee, black tea, and red wine; milk, dairy products, and foods that are high in calcium; beans and soybeans; whole grains. Constipation can be a side effect of iron supplementation. Increased water and fiber intake are helpful. Water goal: > 2 liters/day. Fiber goal: > 25 grams/day.  Will check labs at next visit.    CBC Latest Ref Rng & Units 04/15/2020 11/12/2019 10/27/2018  WBC 3.4 - 10.8 x10E3/uL 6.9  5.7 6.6  Hemoglobin 11.1 - 15.9 g/dL 12.1 12.0 12.6  Hematocrit 34.0 - 46.6 % 40.3 38.0 39.6  Platelets 150 - 450 x10E3/uL 225 196.0 210.0   Lab Results  Component Value Date   IRON 58 10/27/2018   Lab Results  Component Value Date   VITAMINB12 638 04/15/2020   - CBC with Differential/Platelet - Vitamin B12 - Folate - Iron and TIBC - Ferritin  5. Essential hypertension At goal. Medications: Norvasc 10 mg daily, Maxzide 37.5-25 mg 0.5-1 tablet daily.   Plan: Avoid buying foods that are: processed, frozen, or prepackaged to avoid excess salt. We will continue to monitor closely alongside her PCP and/or Specialist.  Regular follow up with PCP and specialists was also encouraged.   BP Readings from Last 3 Encounters:  12/10/20 117/75  11/27/20 125/82  10/27/20 120/80   Lab Results  Component Value Date   CREATININE 0.72 04/15/2020   6. At risk for side effect of medication Due to Molly Bean's current conditions and medications, she is at a higher risk for drug side effect.  At least 10 minutes was spent on counseling her about these concerns today.  We discussed the benefits and potential risks of these medications, and all of patient's concerns were addressed and questions were answered.  she will call us, or their PCP or other specialists who treat their conditions with medications, with any questions or concerns that may develop.    7. Class 3 severe obesity with serious comorbidity and body mass index (BMI) of 40.0 to 44.9 in adult, unspecified obesity type [facility])  Course: Molly Bean is currently in the action stage of change. As such, her goal is to continue with weight loss efforts.   Nutrition goals: She has agreed to following a lower carbohydrate, vegetable and lean protein rich diet plan.   Exercise goals: All adults should avoid inactivity. Some physical activity is better than none, and adults who participate in any amount of physical activity gain some health  benefits.  Behavioral modification strategies: increasing lean protein intake, decreasing simple carbohydrates, increasing water intake, no skipping meals, meal planning and cooking strategies and planning for success.  Margareth has agreed to follow-up with our clinic on 3/23, but will come in 2 days prior for fasting blood work. She was informed of the importance of frequent follow-up visits to maximize her success with intensive lifestyle modifications for her multiple health conditions.   Objective:   Blood pressure 117/75, pulse 81, temperature 98.9 F (37.2 C), height 5\' 2"  (1.575 m), weight 240 lb (108.9 kg), SpO2 97 %. Body mass index is 43.9 kg/m.  General: Cooperative, alert, well developed, in no acute distress. HEENT: Conjunctivae and lids unremarkable. Cardiovascular: Regular rhythm.  Lungs: Normal work of breathing. Neurologic: No focal deficits.   Lab Results  Component Value Date  CREATININE 0.72 04/15/2020   BUN 8 04/15/2020   NA 143 04/15/2020   K 4.3 04/15/2020   CL 103 04/15/2020   CO2 24 04/15/2020   Lab Results  Component Value Date   ALT 14 04/15/2020   AST 14 04/15/2020   ALKPHOS 83 04/15/2020   BILITOT 0.3 04/15/2020   Lab Results  Component Value Date   HGBA1C 6.1 (H) 08/19/2020   HGBA1C 6.1 (H) 04/15/2020   Lab Results  Component Value Date   INSULIN 21.9 04/15/2020   Lab Results  Component Value Date   TSH 1.100 04/15/2020   Lab Results  Component Value Date   CHOL 182 04/15/2020   HDL 58 04/15/2020   LDLCALC 107 (H) 04/15/2020   TRIG 91 04/15/2020   CHOLHDL 3 11/06/2010   Lab Results  Component Value Date   WBC 6.9 04/15/2020   HGB 12.1 04/15/2020   HCT 40.3 04/15/2020   MCV 70 (L) 04/15/2020   PLT 225 04/15/2020   Lab Results  Component Value Date   IRON 58 10/27/2018   Attestation Statements:   Reviewed by clinician on day of visit: allergies, medications, problem list, medical history, surgical history, family  history, social history, and previous encounter notes.  I, Water quality scientist, CMA, am acting as Location manager for [employer], DO.  I have reviewed the above documentation for accuracy and completeness, and I agree with the above. Marjory Sneddon, D.O.  The [place] was signed into law in 2016 which includes the topic of electronic health records.  This provides immediate access to information in MyChart.  This includes consultation notes, operative notes, office notes, lab results and pathology reports.  If you have any questions about what you read please let us know at your next visit so we can discuss your concerns and take corrective action if need be.  We are right here with you.   Orders Placed This Encounter  Procedures  . CBC with Differential/Platelet  . Hemoglobin A1c  . Insulin, random  . Lipid panel  . VITAMIN D 25 Hydroxy (Vit-D Deficiency, Fractures)  . Vitamin B12  . Folate  . Iron and TIBC  . Ferritin    Medications Discontinued During This Encounter  Medication Reason  . metFORMIN (GLUCOPHAGE) 500 MG tablet Reorder     Meds ordered this encounter  Medications  . metFORMIN (GLUCOPHAGE) 500 MG tablet    Sig: Take 1 tablet (500 mg total) by mouth 2 (two) times daily with a meal.    Dispense:  60 tablet    Refill:  0

## 2020-12-22 NOTE — Telephone Encounter (Signed)
Dr.Opalski ?

## 2020-12-24 ENCOUNTER — Ambulatory Visit (INDEPENDENT_AMBULATORY_CARE_PROVIDER_SITE_OTHER): Payer: BC Managed Care – PPO | Admitting: Family Medicine

## 2020-12-30 ENCOUNTER — Encounter (INDEPENDENT_AMBULATORY_CARE_PROVIDER_SITE_OTHER): Payer: Self-pay | Admitting: Family Medicine

## 2020-12-30 ENCOUNTER — Ambulatory Visit (INDEPENDENT_AMBULATORY_CARE_PROVIDER_SITE_OTHER): Payer: BC Managed Care – PPO | Admitting: Family Medicine

## 2020-12-30 ENCOUNTER — Other Ambulatory Visit: Payer: Self-pay

## 2020-12-30 VITALS — BP 138/79 | HR 64 | Temp 98.9°F | Ht 62.0 in | Wt 238.0 lb

## 2020-12-30 DIAGNOSIS — E559 Vitamin D deficiency, unspecified: Secondary | ICD-10-CM

## 2020-12-30 DIAGNOSIS — E7849 Other hyperlipidemia: Secondary | ICD-10-CM | POA: Diagnosis not present

## 2020-12-30 DIAGNOSIS — R7303 Prediabetes: Secondary | ICD-10-CM

## 2020-12-30 DIAGNOSIS — K5909 Other constipation: Secondary | ICD-10-CM | POA: Diagnosis not present

## 2020-12-30 DIAGNOSIS — Z9189 Other specified personal risk factors, not elsewhere classified: Secondary | ICD-10-CM | POA: Diagnosis not present

## 2020-12-30 DIAGNOSIS — Z6841 Body Mass Index (BMI) 40.0 and over, adult: Secondary | ICD-10-CM

## 2020-12-30 MED ORDER — LINACLOTIDE 145 MCG PO CAPS: 145.0000 ug | ORAL_CAPSULE | Freq: Every day | ORAL | 0 refills | Status: DC

## 2020-12-30 MED ORDER — VITAMIN D (ERGOCALCIFEROL) 1.25 MG (50000 UNIT) PO CAPS: ORAL_CAPSULE | ORAL | 0 refills | Status: DC

## 2020-12-31 LAB — CBC WITH DIFFERENTIAL/PLATELET
Basophils Absolute: 0 10*3/uL (ref 0.0–0.2)
Basos: 0 %
EOS (ABSOLUTE): 0.1 10*3/uL (ref 0.0–0.4)
Eos: 1 %
Hematocrit: 40.6 % (ref 34.0–46.6)
Hemoglobin: 12.6 g/dL (ref 11.1–15.9)
Immature Grans (Abs): 0 10*3/uL (ref 0.0–0.1)
Immature Granulocytes: 0 %
Lymphocytes Absolute: 3.4 10*3/uL — ABNORMAL HIGH (ref 0.7–3.1)
Lymphs: 36 %
MCH: 21.2 pg — ABNORMAL LOW (ref 26.6–33.0)
MCHC: 31 g/dL — ABNORMAL LOW (ref 31.5–35.7)
MCV: 69 fL — ABNORMAL LOW (ref 79–97)
Monocytes Absolute: 0.6 10*3/uL (ref 0.1–0.9)
Monocytes: 7 %
Neutrophils Absolute: 5.1 10*3/uL (ref 1.4–7.0)
Neutrophils: 56 %
Platelets: 220 10*3/uL (ref 150–450)
RBC: 5.93 x10E6/uL — ABNORMAL HIGH (ref 3.77–5.28)
RDW: 18.6 % — ABNORMAL HIGH (ref 11.7–15.4)
WBC: 9.2 10*3/uL (ref 3.4–10.8)

## 2020-12-31 LAB — VITAMIN B12: Vitamin B-12: 565 pg/mL (ref 232–1245)

## 2020-12-31 LAB — HEMOGLOBIN A1C
Est. average glucose Bld gHb Est-mCnc: 131 mg/dL
Hgb A1c MFr Bld: 6.2 % — ABNORMAL HIGH (ref 4.8–5.6)

## 2020-12-31 LAB — LIPID PANEL
Chol/HDL Ratio: 2.7 ratio (ref 0.0–4.4)
Cholesterol, Total: 174 mg/dL (ref 100–199)
HDL: 65 mg/dL (ref >39)
LDL Chol Calc (NIH): 97 mg/dL (ref 0–99)
Triglycerides: 62 mg/dL (ref 0–149)
VLDL Cholesterol Cal: 12 mg/dL (ref 5–40)

## 2020-12-31 LAB — IRON AND TIBC
Iron Saturation: 17 % (ref 15–55)
Iron: 51 ug/dL (ref 27–139)
Total Iron Binding Capacity: 300 ug/dL (ref 250–450)
UIBC: 249 ug/dL (ref 118–369)

## 2020-12-31 LAB — LDL CHOLESTEROL, DIRECT: LDL Direct: 95 mg/dL (ref 0–99)

## 2020-12-31 LAB — FERRITIN: Ferritin: 55 ng/mL (ref 15–150)

## 2020-12-31 LAB — VITAMIN D 25 HYDROXY (VIT D DEFICIENCY, FRACTURES): Vit D, 25-Hydroxy: 46.9 ng/mL (ref 30.0–100.0)

## 2020-12-31 LAB — FOLATE: Folate: 9.6 ng/mL (ref >3.0)

## 2021-01-03 ENCOUNTER — Other Ambulatory Visit (INDEPENDENT_AMBULATORY_CARE_PROVIDER_SITE_OTHER): Payer: Self-pay | Admitting: Family Medicine

## 2021-01-03 DIAGNOSIS — R7303 Prediabetes: Secondary | ICD-10-CM

## 2021-01-08 NOTE — Progress Notes (Signed)
Chief Complaint:   OBESITY Molly Bean is here to discuss her progress with her obesity treatment plan along with follow-up of her obesity related diagnoses.   Today's visit was #: 14 Starting weight: 240 lbs Starting date: 04/15/2020 Today's weight: 238 lbs Today's date: 12/30/2020 Total lbs lost to date: 2 lbs Body mass index is 43.53 kg/m.  Total weight loss percentage to date: -0.83%  Interim History:  Molly Bean is able to follow the low carb plan much better.  She says she loves it and she has been "doing great".  She will start back with her exercise regimen in the near future as she was recently cleared to do so.    Plan:  Increase water intake to an ultimate goal of 1/2 her weight in pounds in ounces of water per day.  Current Meal Plan: following a lower carbohydrate, vegetable and lean protein rich diet plan for 90% of the time.  Current Exercise Plan: None.  Assessment/Plan:   Orders Placed This Encounter  Procedures  . LDL cholesterol, direct    Medications Discontinued During This Encounter  Medication Reason  . Vitamin D, Ergocalciferol, (DRISDOL) 1.25 MG (50000 UNIT) CAPS capsule Reorder  . linaclotide (LINZESS) 145 MCG CAPS capsule Reorder     Meds ordered this encounter  Medications  . Vitamin D, Ergocalciferol, (DRISDOL) 1.25 MG (50000 UNIT) CAPS capsule    Sig: 1 po q wed, and 1 po q sun    Dispense:  8 capsule    Refill:  0  . linaclotide (LINZESS) 145 MCG CAPS capsule    Sig: Take 1 capsule (145 mcg total) by mouth daily before breakfast.    Dispense:  30 capsule    Refill:  0     1. Vitamin D deficiency Not at goal. Current vitamin D is 24.3, tested on 08/19/2020. Optimal goal > 50 ng/dL.  She is taking vitamin D 50,000 IU twice weekly.  Plan: Continue to take prescription Vitamin D @50 ,000 IU twice weekly as prescribed.  Follow-up for routine testing of Vitamin D, at least 2-3 times per year to avoid over-replacement.  - Refill Vitamin D,  Ergocalciferol, (DRISDOL) 1.25 MG (50000 UNIT) CAPS capsule; 1 po q wed, and 1 po q sun  Dispense: 8 capsule; Refill: 0  2. Other constipation Symptoms are much better with Linzess.  No issues.  Plan:  Refill Linzess.  Increase water intake.  Increase activity.   - Refill linaclotide (LINZESS) 145 MCG CAPS capsule; Take 1 capsule (145 mcg total) by mouth daily before breakfast.  Dispense: 30 capsule; Refill: 0  3. Other hyperlipidemia Course: Controlled. Lipid-lowering medications: None.   Plan: Dietary changes: Increase soluble fiber, decrease simple carbohydrates, decrease saturated fat. Exercise changes: Moderate to vigorous-intensity aerobic activity 150 minutes per week or as tolerated. We will continue to monitor along with PCP/specialists as it pertains to her weight loss journey.  Check labs.  Follow meal plan.  Lab Results  Component Value Date   CHOL 174 12/30/2020   HDL 65 12/30/2020   LDLCALC 97 12/30/2020   LDLDIRECT 95 12/30/2020   TRIG 62 12/30/2020   CHOLHDL 2.7 12/30/2020   Lab Results  Component Value Date   ALT 14 04/15/2020   AST 14 04/15/2020   ALKPHOS 83 04/15/2020   BILITOT 0.3 04/15/2020   The 10-year ASCVD risk score [name], et al., 2013) is: 5.9%   Values used to calculate the score:     Age: 60 years  Sex: Female     Is Non-Hispanic African American: Yes     Diabetic: No     Tobacco smoker: No     Systolic Blood Pressure: 500 mmHg     Is BP treated: Yes     HDL Cholesterol: 65 mg/dL     Total Cholesterol: 174 mg/dL  - LDL cholesterol, direct  4. At risk for dehydration Molly Bean is at higher than average risk of dehydration.  Molly Bean was given more than 8 minutes of proper hydration counseling today.  We discussed the signs and symptoms of dehydration, some of which may include muscle cramping, constipation or even orthostatic symptoms.  Counseling on the prevention of dehydration was also provided today.  Molly Bean is at risk for  dehydration due to weight loss, lifestyle and behavorial habits and possibly due to taking certain medication(s).  She was encouraged to adequately hydrate and monitor fluid status to avoid dehydration as well as weight loss plateaus.  Unless pre-existing renal or cardiopulmonary conditions exist, in which patient was told to limit their fluid intake, I recommended roughly one half of their weight in pounds to be the approximate ounces of non-caloric, non-caffeinated beverages they should drink per day; including more if they are engaging in exercise.  5. Obesity, current BMI 43.6  Course: Molly Bean is currently in the action stage of change. As such, her goal is to continue with weight loss efforts.   Nutrition goals: She has agreed to following a lower carbohydrate, vegetable and lean protein rich diet plan.   Exercise goals: Start with 30 minutes of cardio 3 days per week.  Behavioral modification strategies: increasing water intake, keeping healthy foods in the home, avoiding temptations and planning for success.  Molly Bean has agreed to follow-up with our clinic in 2-3 weeks. She was informed of the importance of frequent follow-up visits to maximize her success with intensive lifestyle modifications for her multiple health conditions.   Objective:   Blood pressure 138/79, pulse 64, temperature 98.9 F (37.2 C), height 5\' 2"  (1.575 m), weight 238 lb (108 kg), SpO2 97 %. Body mass index is 43.53 kg/m.  General: Cooperative, alert, well developed, in no acute distress. HEENT: Conjunctivae and lids unremarkable. Cardiovascular: Regular rhythm.  Lungs: Normal work of breathing. Neurologic: No focal deficits.   Lab Results  Component Value Date   CREATININE 0.72 04/15/2020   BUN 8 04/15/2020   NA 143 04/15/2020   K 4.3 04/15/2020   CL 103 04/15/2020   CO2 24 04/15/2020   Lab Results  Component Value Date   ALT 14 04/15/2020   AST 14 04/15/2020   ALKPHOS 83 04/15/2020   BILITOT  0.3 04/15/2020   Lab Results  Component Value Date   HGBA1C 6.2 (H) 12/30/2020   HGBA1C 6.1 (H) 08/19/2020   HGBA1C 6.1 (H) 04/15/2020   Lab Results  Component Value Date   INSULIN 21.9 04/15/2020   Lab Results  Component Value Date   TSH 1.100 04/15/2020   Lab Results  Component Value Date   CHOL 174 12/30/2020   HDL 65 12/30/2020   LDLCALC 97 12/30/2020   LDLDIRECT 95 12/30/2020   TRIG 62 12/30/2020   CHOLHDL 2.7 12/30/2020   Lab Results  Component Value Date   WBC 9.2 12/30/2020   HGB 12.6 12/30/2020   HCT 40.6 12/30/2020   MCV 69 (L) 12/30/2020   PLT 220 12/30/2020   Lab Results  Component Value Date   IRON 51 12/30/2020   TIBC 300  12/30/2020   FERRITIN 55 12/30/2020   Attestation Statements:   Reviewed by clinician on day of visit: allergies, medications, problem list, medical history, surgical history, family history, social history, and previous encounter notes.  I, Water quality scientist, CMA, am acting as Location manager for [employer], DO.  I have reviewed the above documentation for accuracy and completeness, and I agree with the above. Marjory Sneddon, D.O.  The [place] was signed into law in 2016 which includes the topic of electronic health records.  This provides immediate access to information in MyChart.  This includes consultation notes, operative notes, office notes, lab results and pathology reports.  If you have any questions about what you read please let us know at your next visit so we can discuss your concerns and take corrective action if need be.  We are right here with you.

## 2021-01-12 ENCOUNTER — Other Ambulatory Visit: Payer: Self-pay

## 2021-01-13 ENCOUNTER — Other Ambulatory Visit (INDEPENDENT_AMBULATORY_CARE_PROVIDER_SITE_OTHER): Payer: Self-pay | Admitting: Family Medicine

## 2021-01-13 ENCOUNTER — Encounter: Payer: Self-pay | Admitting: Internal Medicine

## 2021-01-13 ENCOUNTER — Ambulatory Visit (INDEPENDENT_AMBULATORY_CARE_PROVIDER_SITE_OTHER): Payer: BC Managed Care – PPO | Admitting: Internal Medicine

## 2021-01-13 VITALS — BP 128/82 | HR 73 | Temp 98.5°F | Ht 62.0 in | Wt 243.0 lb

## 2021-01-13 DIAGNOSIS — L02411 Cutaneous abscess of right axilla: Secondary | ICD-10-CM | POA: Diagnosis not present

## 2021-01-13 DIAGNOSIS — E559 Vitamin D deficiency, unspecified: Secondary | ICD-10-CM

## 2021-01-13 DIAGNOSIS — A498 Other bacterial infections of unspecified site: Secondary | ICD-10-CM

## 2021-01-13 MED ORDER — DOXYCYCLINE HYCLATE 100 MG PO CAPS: 100.0000 mg | ORAL_CAPSULE | Freq: Two times a day (BID) | ORAL | 0 refills | Status: DC

## 2021-01-13 NOTE — Telephone Encounter (Signed)
Pt last seen by Dr. Opalski.  

## 2021-01-13 NOTE — Patient Instructions (Signed)
Incision and Drainage Incision and drainage is a surgical procedure to open and drain a fluid-filled sac. The sac may be filled with pus, mucus, or blood. Examples of fluid-filled sacs that may need surgical drainage include cysts, skin infections (abscesses), and red lumps that develop from a ruptured cyst or a small abscess (boils). You may need this procedure if the affected area is large, painful, infected, or not healing well. Tell a health care provider about:  Any allergies you have.  All medicines you are taking, including vitamins, herbs, eye drops, creams, and over-the-counter medicines.  Any problems you or family members have had with anesthetic medicines.  Any blood disorders you have or have had.  Any surgeries you have had.  Any medical conditions you have or have had.  Whether you are pregnant or may be pregnant. What are the risks? Generally, this is a safe procedure. However, problems may occur, including:  Infection.  Bleeding.  Allergic reactions to medicines.  Scarring.  The cyst or abscess returns.  Damage to nerves or vessels. What happens before the procedure? Medicine Ask your health care provider about:  Changing or stopping your regular medicines. This is especially important if you are taking diabetes medicines or blood thinners.  Taking medicines such as aspirin and ibuprofen. These medicines can thin your blood. Do not take these medicines unless your health care provider tells you to take them.  Taking over-the-counter medicines, vitamins, herbs, and supplements. Tests You may have an exam or testing. These may include:  Ultrasound or other imaging tests to see how large or deep the fluid-filled sac is.  Blood tests to check for infection. General instructions  Follow instructions from your health care provider about eating or drinking restrictions.  Plan to have someone take you home from the hospital or clinic.  Ask your health care  provider whether a responsible adult should care for you for at least 24 hours after you leave the hospital or clinic. This is important.  You may get a tetanus shot.  Ask your health care provider: ? How your surgery site will be marked or identified. ? What steps will be taken to help prevent infection. These may include:  Removing hair at the surgery site.  Washing skin with a germ-killing soap.  Receiving antibiotic medicine. What happens during the procedure?  An IV may be inserted into one of your veins.  You will be given one or more of the following: ? A medicine to help you relax (sedative). ? A medicine to numb the area (local anesthetic). ? A medicine to make you fall asleep (general anesthetic).  An incision will be made in the top of the fluid-filled sac.  Pus, blood, and mucus will be squeezed out, and a syringe or tube (drain) may be used to empty more fluid from the sac.  Your health care provider will do one of the following. He or she may: ? Leave the drain in place for several weeks to drain more fluid. ? Stitch open the edges of the incision to make a long-term opening for drainage (marsupialization).  The inside of the sac may be washed out (irrigated) with a sterile solution and packed with gauze before it is covered with a bandage (dressing).  Your health care provider do a culture test of the drainage fluid. The procedure may vary among health care providers and hospitals.   What happens after the procedure?  Your blood pressure, heart rate, breathing rate, and blood oxygen   level will be monitored often until you leave the hospital or clinic.  Do not drive for 24 hours if you were given a sedative during your procedure. Summary  Incision and drainage is a surgical procedure to open and drain a fluid-filled sac. The sac may be filled with pus, mucus, or blood.  Before the procedure, you may be given antibiotic medicine to treat or help prevent  infection.  During the procedure, an incision will be made in the top of the fluid-filled sac. Pus, blood, and mucus is squeezed out, and a syringe or tube (drain) may be used to empty more fluid from the sac.  The inside of the sac may be washed out (irrigated) with a sterile solution and packed with gauze before it is covered with a bandage (dressing). This information is not intended to replace advice given to you by your health care provider. Make sure you discuss any questions you have with your health care provider. Document Revised: 08/21/2018 Document Reviewed: 08/21/2018 Elsevier Patient Education  2021 Elsevier Inc.  

## 2021-01-13 NOTE — Progress Notes (Signed)
Subjective:  Patient ID: Molly Bean, female    DOB: 05/03/1961  Age: 60 y.o. MRN: [id number]  CC: Abscess  This visit occurred during the SARS-CoV-2 public health emergency.  Safety protocols were in place, including screening questions prior to the visit, additional usage of staff PPE, and extensive cleaning of exam room while observing appropriate contact time as indicated for disinfecting solutions.    HPI Molly Bean presents for concerns about an area of pain and swelling in her right axilla for one week. She has gotten some pus out of the area. She has not seen any improvement with topical clindamycin which a derm gave her for HS.  Outpatient Medications Prior to Visit  Medication Sig Dispense Refill  . acetaminophen (TYLENOL) 500 MG tablet Take 1,000 mg by mouth every 6 (six) hours as needed for mild pain or headache.    . albuterol (VENTOLIN HFA) 108 (90 Base) MCG/ACT inhaler TAKE 2 PUFFS BY MOUTH EVERY 6 HOURS AS NEEDED FOR WHEEZE OR SHORTNESS OF BREATH 8.5 g 5  . Ferrous Sulfate (IRON) 325 (65 Fe) MG TABS Take 1 tablet by mouth daily at 2 PM.    . linaclotide (LINZESS) 145 MCG CAPS capsule Take 1 capsule (145 mcg total) by mouth daily before breakfast. 30 capsule 0  . methocarbamol (ROBAXIN) 500 MG tablet Take 1 tablet (500 mg total) by mouth every 8 (eight) hours as needed for muscle spasms. 30 tablet 0  . triamterene-hydrochlorothiazide (MAXZIDE-25) 37.5-25 MG tablet TAKE 0.5-1 TABLET BY MOUTH DAILY. 90 tablet 1  . Vitamin D, Ergocalciferol, (DRISDOL) 1.25 MG (50000 UNIT) CAPS capsule 1 po q wed, and 1 po q sun 8 capsule 0  . clindamycin (CLEOCIN T) 1 % lotion Apply 1 application topically 2 (two) times daily.    . metFORMIN (GLUCOPHAGE) 500 MG tablet Take 1 tablet (500 mg total) by mouth 2 (two) times daily with a meal. 60 tablet 0  . amLODipine (NORVASC) 10 MG tablet Take 1 tablet (10 mg total) by mouth daily. 90 tablet 3   No facility-administered medications prior  to visit.    ROS Review of Systems  Constitutional: Negative for chills and fever.  Gastrointestinal: Negative for abdominal pain.  Skin: Negative for color change and rash.  Hematological: Negative for adenopathy. Does not bruise/bleed easily.  All other systems reviewed and are negative.   Objective:  BP 128/82   Pulse 73   Temp 98.5 F (36.9 C) (Oral)   Ht 5\' 2"  (1.575 m)   Wt 243 lb (110.2 kg)   SpO2 98%   BMI 44.45 kg/m   BP Readings from Last 3 Encounters:  01/13/21 128/82  12/30/20 138/79  12/10/20 117/75    Wt Readings from Last 3 Encounters:  01/13/21 243 lb (110.2 kg)  12/30/20 238 lb (108 kg)  12/10/20 240 lb (108.9 kg)    Physical Exam Vitals reviewed.  Constitutional:      General: She is not in acute distress.    Appearance: She is not toxic-appearing or diaphoretic.  Skin:    Comments: Right axilla - there is a 3 x 1 cm area or ttp, fluctuance, swelling, and erythema over the distal aspect.  Neurological:     Mental Status: She is alert.     Lab Results  Component Value Date   WBC 9.2 12/30/2020   HGB 12.6 12/30/2020   HCT 40.6 12/30/2020   PLT 220 12/30/2020   GLUCOSE 89 04/15/2020   CHOL 174 12/30/2020  TRIG 62 12/30/2020   HDL 65 12/30/2020   LDLDIRECT 95 12/30/2020   LDLCALC 97 12/30/2020   ALT 14 04/15/2020   AST 14 04/15/2020   NA 143 04/15/2020   K 4.3 04/15/2020   CL 103 04/15/2020   CREATININE 0.72 04/15/2020   BUN 8 04/15/2020   CO2 24 04/15/2020   TSH 1.100 04/15/2020   INR 1.00 09/25/2016   HGBA1C 6.2 (H) 12/30/2020    CT Soft Tissue Neck W Contrast  Result Date: 11/22/2019 CLINICAL DATA:  Cervical lymphadenopathy on chest CT. EXAM: CT NECK WITH CONTRAST TECHNIQUE: Multidetector CT imaging of the neck was performed using the standard protocol following the bolus administration of intravenous contrast. CONTRAST:  17mL OMNIPAQUE IOHEXOL 300 MG/ML  SOLN COMPARISON:  Chest CT from 1 week ago FINDINGS: Pharynx and  larynx: No evidence of mass or swelling. Salivary glands: No inflammation, mass, or stone. Thyroid: Normal. Lymph nodes: None enlarged or abnormal density. The CT finding on prior was partially covered anterior belly of the right digastric. Vascular: Negative Limited intracranial: Negative Visualized orbits: Negative Mastoids and visualized paranasal sinuses:Mucosal thickening in the maxillary sinuses, most notable on the left where there is a periapical erosion affecting the remaining left upper molar with tooth root projecting into the inferior sinus. Skeleton: Lower cervical disc degeneration. No acute or aggressive finding. Upper chest: Negative IMPRESSION: 1. Negative for lymphadenopathy. The chest CT finding was partial coverage of the anterior belly digastric. 2. Mild left-sided maxillary sinusitis which may be odontogenic Electronically Signed   By: Monte Fantasia M.D.   On: 11/22/2019 04:52  Source: WOUND (SITE NOT SPECIFIED)   Status: FINAL   Gram Stain: Rare White blood cells seen Rare epithelial cells Rare Gram positive cocci   Isolate 1: Proteus mirabilisAbnormal   Comment: Light growth of Proteus mirabilis  Resulting Agency [facility]      Susceptibility   Proteus mirabilis    AEROBIC CULT, GRAM STAIN NEGATIVE 1    AMOX/CLAVULANIC <=2  Sensitive    AMPICILLIN <=2  Sensitive    AMPICILLIN/SULBACTAM <=2  Sensitive    CEFAZOLIN <=4  Not Reportable 1    CEFEPIME <=1  Sensitive    CEFTRIAXONE <=1  Sensitive    CIPROFLOXACIN <=0.25  Sensitive    ERTAPENEM <=0.5  Sensitive    GENTAMICIN <=1  Sensitive    LEVOFLOXACIN <=0.12  Sensitive    PIP/TAZO <=4  Sensitive    TOBRAMYCIN <=1  Sensitive    TRIMETH/SULFA <=20  Sensitive 2                 After informed verbal consent was obtained. Using Betadine for cleansing and 2% Lidocaine with epinephrine for anesthetic (2 cc's used), with sterile technique a 5 mm punch incision was made and a small cavity was found with scant amount of  exudate. The cavity was cultured and irrigated with H2O2 and Qtips. A few loculations were disrupted. No deep tracking was found. The cavity was packed with iodoform. Hemostasis was obtained by pressure. The specimen is labeled and sent. The procedure was well tolerated without complications.   Assessment & Plan:   Oluwatobi was seen today for abscess.  Diagnoses and all orders for this visit:  Cutaneous abscess of right axilla- Successful incision and drainage was performed.  She will remove the iodoform packing in 48 hours.  The initial antibiotic choice was doxycycline but now the culture returns with Proteus mirabilis.  I therefore changed her antibiotic to Bactrim DS. -  WOUND CULTURE; Future -     Discontinue: doxycycline (VIBRAMYCIN) 100 MG capsule; Take 1 capsule (100 mg total) by mouth 2 (two) times daily for 7 days. -     WOUND CULTURE -     sulfamethoxazole-trimethoprim (BACTRIM DS) 800-160 MG tablet; Take 1 tablet by mouth 2 (two) times daily for 7 days.  Proteus mirabilis infection -     sulfamethoxazole-trimethoprim (BACTRIM DS) 800-160 MG tablet; Take 1 tablet by mouth 2 (two) times daily for 7 days.   I have discontinued Meyah Gills's clindamycin, metFORMIN, and doxycycline. I am also having her start on sulfamethoxazole-trimethoprim. Additionally, I am having her maintain her acetaminophen, amLODipine, albuterol, triamterene-hydrochlorothiazide, methocarbamol, Iron, Vitamin D (Ergocalciferol), and linaclotide.  Meds ordered this encounter  Medications  . DISCONTD: doxycycline (VIBRAMYCIN) 100 MG capsule    Sig: Take 1 capsule (100 mg total) by mouth 2 (two) times daily for 7 days.    Dispense:  14 capsule    Refill:  0  . sulfamethoxazole-trimethoprim (BACTRIM DS) 800-160 MG tablet    Sig: Take 1 tablet by mouth 2 (two) times daily for 7 days.    Dispense:  14 tablet    Refill:  0     Follow-up: Return if symptoms worsen or fail to improve.  Scarlette Calico,  MD

## 2021-01-15 ENCOUNTER — Ambulatory Visit: Payer: BC Managed Care – PPO | Attending: Obstetrics and Gynecology | Admitting: Physical Therapy

## 2021-01-15 ENCOUNTER — Other Ambulatory Visit: Payer: Self-pay

## 2021-01-15 ENCOUNTER — Encounter: Payer: Self-pay | Admitting: Physical Therapy

## 2021-01-15 DIAGNOSIS — R278 Other lack of coordination: Secondary | ICD-10-CM | POA: Diagnosis not present

## 2021-01-15 DIAGNOSIS — R2689 Other abnormalities of gait and mobility: Secondary | ICD-10-CM | POA: Diagnosis present

## 2021-01-15 DIAGNOSIS — N393 Stress incontinence (female) (male): Secondary | ICD-10-CM

## 2021-01-15 NOTE — Patient Instructions (Addendum)
   __   Proper body mechanics with getting out of a chair to decrease strain  on back &pelvic floor   Avoid holding your breath when Getting out of the chair:  Scoot to front part of chair chair Heels behind knees, feet are hip width apart, nose over toes  Inhale like you are smelling roses Exhale to stand    __  Avoid straining pelvic floor, abdominal muscles , spine  Use log rolling technique instead of getting out of bed with your neck or the sit-up     Log rolling into and out of bed   Log rolling into and out of bed If getting out of bed on R side, Bent knees, scoot hips/ shoulder to L  Raise R arm completely overhead, rolling onto armpit  Then lower bent knees to bed to get into complete side lying position  Then drop legs off bed, and push up onto R elbow/forearm, and use L hand to push onto the bed   __  Clam Shell 45 Degrees  Lying with hips and knees bent 45, one pillow between knees and ankles. Heel together, toes apart like ballerina,  Lift knee with exhale while pressing heels together. Be sure pelvis does not roll backward. Do not arch back. Do 20 times, each leg, 2 times per day.     Complimentary stretch: Figure-4  ( turn body 45 deg to couch/ chair)  3 breaths

## 2021-01-16 DIAGNOSIS — A498 Other bacterial infections of unspecified site: Secondary | ICD-10-CM | POA: Insufficient documentation

## 2021-01-16 LAB — WOUND CULTURE

## 2021-01-16 MED ORDER — SULFAMETHOXAZOLE-TRIMETHOPRIM 800-160 MG PO TABS: 1.0000 | ORAL_TABLET | Freq: Two times a day (BID) | ORAL | 0 refills | Status: AC

## 2021-01-16 NOTE — Therapy (Signed)
[place] MAIN [facility] SERVICES [address] [place], [place], [address] Phone: [phone]   Fax:  [phone]  Physical Therapy Evaluation  Patient Details  Name: Molly Bean MRN: [id number] Date of Birth: [birth year] Referring Provider (PT): Wannetta Sender MD   Encounter Date: 01/15/2021   PT End of Session - 01/15/21 1717    Visit Number 1    Number of Visits 10    Date for PT Re-Evaluation 03/26/21    PT Start Time 4163    PT Stop Time 1800    PT Time Calculation (min) 55 min           Past Medical History:  Diagnosis Date  . Anxiety   . Constipation   . DEPRESSION   . edema lower extremeties   . GERD   . Joint pain   . MENOPAUSAL SYNDROME   . Multiple food allergies   . OBESITY   . Shortness of breath   . Sleep apnea    wears CPAP    Past Surgical History:  Procedure Laterality Date  . ABDOMINAL HYSTERECTOMY  11/1998   total fibroids  . CESAREAN SECTION      There were no vitals filed for this visit.    Subjective Assessment - 01/15/21 1718    Subjective 1) urinay leakage: Pt notices her underwear was already wet when she arrived to the toilet and did not notice it was wet. Pt wears 2 pads a day. Denied leakage with coughing, sneezing, lifting. Pt has leakage with urgency. Pt goes 3 x in 8 hours. Daily fluid intake: 5 ( 16 fl oz) water with sweeteners, no teas, sodas. Pt used not drink water.    2) bowel movements occured once every two weeks prior to taking Linzess. Pt did not get the urge to go to the bathroom and strained to have bowel movements. Pt had always had sluggish bowels all her life. Pt started Linzess in Oct 2021.   3) pain with intercourse and pelvic exams with speculum 6/10.  Physical activity: walk 30 min /day with permission to return to walking with PT for her R knee. Typically she and husband walks 1 mile.Pt had a fall down on steps 10/13/20 at work and landed on knees. Currently completing PT on her  L knee under workers compensation.    Pertinent History C-section, abdominal hysterectomy.    Patient Stated Goals decreaase leakage, have sex and get through pelvic exams without pain,              [facility] PT Assessment - 01/16/21 1037      Assessment   Medical Diagnosis SUI    Referring Provider (PT) Wannetta Sender MD      Precautions   Precautions None      Restrictions   Weight Bearing Restrictions No      Balance Screen   Has the patient fallen in the past 6 months No      Observation/Other Assessments   Observations ankles crossed.      Coordination   Gross Motor Movements are Fluid and Coordinated --   chest breathing     Sit to Stand   Comments poor form      Strength   Overall Strength Comments hip abduction 3/5 B, hip flexion , knee flexion/ ext 4/5 B      Palpation   Spinal mobility hypomobile thoracic  Objective measurements completed on examination: See above findings.     Pelvic Floor Special Questions - 01/16/21 1037    Diastasis Recti neg    External Perineal Exam suprapubic area , fascial restrictions over C-sections            [facility] Adult PT Treatment/Exercise - 01/16/21 1038      Therapeutic Activites    Other Therapeutic Activities explained importance of plain water intake, decrease sweeteners in water, explaiend role of IAP system for Sx      Neuro Re-ed    Neuro Re-ed Details  cued for clamshells, body mechanics                       PT Long Term Goals - 01/16/21 1038      PT LONG TERM GOAL #1   Title Pt will demo increased hip abduction strength from 3/5 to >4/5 in order to maintain walking routine and pelvic stability    Time 8    Period Weeks    Status New    Target Date 03/13/21      PT LONG TERM GOAL #2   Title Pt will demo proper deepcore coordination and pelvic floor movement with no cues to progress to kegel exercises    Time 4    Period Weeks    Status New    Target Date  02/13/21      PT LONG TERM GOAL #3   Title Pt will demo decreased abdominal scar restrictions to advance to deep core exercises and promote IAP for continence and bowel movement elimination with less dependence on Linzess    Time 4    Period Weeks    Status New    Target Date 02/13/21      PT LONG TERM GOAL #4   Title Pt will increase FOTO score for constipation from 41 pts and Urinary 47 pts to > 55 pts in order to demo improved pelvic floor function    Time 10    Period Weeks    Status New    Target Date 02/13/21      PT LONG TERM GOAL #5   Title Pt will demo decreased pelvic floor mm tightness and proper relaxation of mm to have less pain with intercourcse and gynecological exams    Time 10    Period Weeks    Status New    Target Date 03/27/21                  Plan - 01/15/21 1718    Clinical Impression Statement  Pt is a 60 yo who presents with urinary leakage, constipation, and pelvic pain which impacts her QOL and ADLs.   Pt's musculoskeletal assessment revealed abdominal scar restrictions, hip weakness,  limited spinal /pelvic mobility, dyscoordination and strength of pelvic floor mm, poor body mechanics which places strain on the abdominal/pelvic floor mm.  Pt has a Hx of hysterectomy and C-section.   These are deficits that indicate an ineffective intraabdominal pressure system associated with increased risk for pt's Sx.   Pt was provided education on etiology of Sx with anatomy, physiology explanation with images along with the benefits of customized pelvic PT Tx based on pt's medical conditions and musculoskeletal deficits.  Explained the physiology of deep core mm coordination and roles of pelvic floor function in urination, defecation, sexual function, and postural control with deep core mm system.   Following Tx today which pt tolerated without complaints, pt  demo'd proper body mechanics to minimize straining pelvic area. Initiated hip strengthening. Plan to  advance to deep core coordination and strengthening.  Pt benefit from skilled PT.     Examination-Activity Limitations Toileting;Continence;Other    Stability/Clinical Decision Making Evolving/Moderate complexity    Clinical Decision Making Moderate    Rehab Potential Good    PT Frequency 1x / week    PT Duration Other (comment)   10   PT Treatment/Interventions Neuromuscular re-education;Cryotherapy;ADLs/Self Care Home Management;Therapeutic activities;Functional mobility training;Traction;Moist Heat;Stair training;Gait training;Patient/family education;Taping;Manual techniques;Scar mobilization;Balance training;Therapeutic exercise;Spinal Manipulations;Joint Manipulations;Energy conservation    Consulted and Agree with Plan of Care Patient           Patient will benefit from skilled therapeutic intervention in order to improve the following deficits and impairments:  Decreased endurance,Decreased safety awareness,Difficulty walking,Decreased coordination,Abnormal gait,Improper body mechanics,Pain,Increased muscle spasms,Hypermobility,Hypomobility,Decreased scar mobility,Decreased mobility,Decreased strength,Postural dysfunction  Visit Diagnosis: Other lack of coordination  Other abnormalities of gait and mobility     Problem List Patient Active Problem List   Diagnosis Date Noted  . Proteus mirabilis infection 01/16/2021  . Cutaneous abscess of right axilla 01/13/2021  . Iron deficiency anemia 12/10/2020  . At risk for side effect of medication 12/10/2020  . At risk for diarrhea 10/27/2020  . At risk for diabetes mellitus 10/14/2020  . Other constipation 08/19/2020  . At risk for dehydration 08/19/2020  . Prediabetes 08/06/2020  . Chronic constipation 08/06/2020  . Other hyperlipidemia 08/06/2020  . Vitamin D deficiency 08/06/2020  . At risk for heart disease 08/06/2020  . Dysuria 04/09/2020  . Vaginitis 04/09/2020  . Thumb pain, right 11/03/2018  . Essential  hypertension 05/01/2018  . Cough 03/24/2018  . SOB (shortness of breath) 03/24/2018  . Asthma 03/14/2018  . Hidradenitis suppurativa 01/18/2018  . Generalized anxiety disorder 10/11/2016  . Chronic shoulder pain 06/09/2016  . MENOPAUSAL SYNDROME 04/23/2009  . OBESITY 02/15/2008  . Depression 04/12/2007  . Gastroesophageal reflux disease 04/12/2007  . Edema 04/12/2007    Jerl Mina ,PT, DPT, E-RYT  01/16/2021, 11:45 AM  [place] MAIN [facility] SERVICES [address] [place], [place], [address] Phone: [phone]   Fax:  [phone]  Name: Molly Bean MRN: [id number] Date of Birth: [birth year]

## 2021-01-22 ENCOUNTER — Ambulatory Visit: Payer: BC Managed Care – PPO | Admitting: Physical Therapy

## 2021-01-22 ENCOUNTER — Other Ambulatory Visit: Payer: Self-pay

## 2021-01-22 DIAGNOSIS — R2689 Other abnormalities of gait and mobility: Secondary | ICD-10-CM

## 2021-01-22 DIAGNOSIS — R278 Other lack of coordination: Secondary | ICD-10-CM | POA: Diagnosis not present

## 2021-01-22 DIAGNOSIS — N393 Stress incontinence (female) (male): Secondary | ICD-10-CM

## 2021-01-22 NOTE — Patient Instructions (Signed)
Angel wings with arms   10 reps   __  Rocking knees side to side 45 deg With palms pulling skin at low abdomen up 10 reps   __  Abdominal massage upward from L,  R , center to belly button 3 stroke x 3 , pressure is gentle and light with all fingers flat, not using finger tips   ___   Clam Shell 45 Degrees  Lying with hips and knees bent 45, one pillow between knees and ankles. Heel together, toes apart like ballerina,  Lift knee with exhale while pressing heels together. Be sure pelvis does not roll backward. Do not arch back. Do 20 times, each leg, 2 times per day.     Complimentary stretch: [employer] _ foot over _ thigh, opposite knee straight  3 breaths

## 2021-01-22 NOTE — Therapy (Signed)
[place] MAIN [facility] SERVICES [address] [place], [place], [address] Phone: [phone]   Fax:  [phone]  Physical Therapy Treatment  Patient Details  Name: Molly Bean MRN: [id number] Date of Birth: Jan 20, 1961 Referring Provider (PT): Wannetta Sender MD   Encounter Date: 01/22/2021   PT End of Session - 01/22/21 1754    Visit Number 2    Number of Visits 10    Date for PT Re-Evaluation 03/26/21    PT Start Time 5053    PT Stop Time 1756    PT Time Calculation (min) 51 min           Past Medical History:  Diagnosis Date  . Anxiety   . Constipation   . DEPRESSION   . edema lower extremeties   . GERD   . Joint pain   . MENOPAUSAL SYNDROME   . Multiple food allergies   . OBESITY   . Shortness of breath   . Sleep apnea    wears CPAP    Past Surgical History:  Procedure Laterality Date  . ABDOMINAL HYSTERECTOMY  11/1998   total fibroids  . CESAREAN SECTION      There were no vitals filed for this visit.   Subjective Assessment - 01/22/21 1708    Subjective Pt had no problem with the exercises    Pertinent History C-section, abdominal hysterectomy.    Patient Stated Goals decreaase leakage, have sex and get through pelvic exams without pain,              [facility] PT Assessment - 01/22/21 1748      Palpation   Spinal mobility hypomobilty at cervical, interspinal mm at C/T junction    Palpation comment increased C section scar restrictions                         [facility] Adult PT Treatment/Exercise - 01/22/21 1749      Neuro Re-ed    Neuro Re-ed Details  cued for hip abduction strengthening, cervical scapulo mobility      Manual Therapy   Manual therapy comments fascial releases with MWM , distraction at occiput to minimize forward head                       PT Long Term Goals - 01/16/21 1038      PT LONG TERM GOAL #1   Title Pt will demo increased hip abduction strength from 3/5  to >4/5 in order to maintain walking routine and pelvic stability    Time 8    Period Weeks    Status New    Target Date 03/13/21      PT LONG TERM GOAL #2   Title Pt will demo proper deepcore coordination and pelvic floor movement with no cues to progress to kegel exercises    Time 4    Period Weeks    Status New    Target Date 02/13/21      PT LONG TERM GOAL #3   Title Pt will demo decreased abdominal scar restrictions to advance to deep core exercises and promote IAP for continence and bowel movement elimination with less dependence on Linzess    Time 4    Period Weeks    Status New    Target Date 02/13/21      PT LONG TERM GOAL #4   Title Pt will increase FOTO score for constipation from 41 pts  and Urinary 47 pts to > 55 pts in order to demo improved pelvic floor function    Time 10    Period Weeks    Status New    Target Date 02/13/21      PT LONG TERM GOAL #5   Title Pt will demo decreased pelvic floor mm tightness and proper relaxation of mm to have less pain with intercourcse and gynecological exams    Time 10    Period Weeks    Status New    Target Date 03/27/21                 Plan - 01/22/21 1752    Clinical Impression Statement Pt demo'd less thoracic mm tightness today. Focused on decreasing C section scar after which pt demo'd improved expansion of deep core mm and she will be ready for Deep core level 1 and 2 next session. Pt also required manual Tx to minimize forward head posture. Pt will be ready for scapulothoracic strengthening at next session. Initiated hip abduction strengtehening which pt required cues for technique. Pt continues to benefit from skilled PT   Examination-Activity Limitations Toileting;Continence;Other    Stability/Clinical Decision Making Evolving/Moderate complexity    Rehab Potential Good    PT Frequency 1x / week    PT Duration Other (comment)   10   PT Treatment/Interventions Neuromuscular re-education;Cryotherapy;ADLs/Self  Care Home Management;Therapeutic activities;Functional mobility training;Traction;Moist Heat;Stair training;Gait training;Patient/family education;Taping;Manual techniques;Scar mobilization;Balance training;Therapeutic exercise;Spinal Manipulations;Joint Manipulations;Energy conservation    Consulted and Agree with Plan of Care Patient           Patient will benefit from skilled therapeutic intervention in order to improve the following deficits and impairments:  Decreased endurance,Decreased safety awareness,Difficulty walking,Decreased coordination,Abnormal gait,Improper body mechanics,Pain,Increased muscle spasms,Hypermobility,Hypomobility,Decreased scar mobility,Decreased mobility,Decreased strength,Postural dysfunction  Visit Diagnosis: Other lack of coordination  Other abnormalities of gait and mobility  Stress incontinence of urine     Problem List Patient Active Problem List   Diagnosis Date Noted  . Proteus mirabilis infection 01/16/2021  . Cutaneous abscess of right axilla 01/13/2021  . Iron deficiency anemia 12/10/2020  . At risk for side effect of medication 12/10/2020  . At risk for diarrhea 10/27/2020  . At risk for diabetes mellitus 10/14/2020  . Other constipation 08/19/2020  . At risk for dehydration 08/19/2020  . Prediabetes 08/06/2020  . Chronic constipation 08/06/2020  . Other hyperlipidemia 08/06/2020  . Vitamin D deficiency 08/06/2020  . At risk for heart disease 08/06/2020  . Dysuria 04/09/2020  . Vaginitis 04/09/2020  . Thumb pain, right 11/03/2018  . Essential hypertension 05/01/2018  . Cough 03/24/2018  . SOB (shortness of breath) 03/24/2018  . Asthma 03/14/2018  . Hidradenitis suppurativa 01/18/2018  . Generalized anxiety disorder 10/11/2016  . Chronic shoulder pain 06/09/2016  . MENOPAUSAL SYNDROME 04/23/2009  . OBESITY 02/15/2008  . Depression 04/12/2007  . Gastroesophageal reflux disease 04/12/2007  . Edema 04/12/2007    Jerl Mina ,PT, DPT, E-RYT  01/22/2021, 6:01 PM  [place] MAIN [facility] SERVICES [address] [place], [place], [address] Phone: [phone]   Fax:  [phone]  Name: Tymber Stallings MRN: [id number] Date of Birth: 1961-03-21

## 2021-01-27 ENCOUNTER — Other Ambulatory Visit: Payer: Self-pay

## 2021-01-27 ENCOUNTER — Ambulatory Visit (INDEPENDENT_AMBULATORY_CARE_PROVIDER_SITE_OTHER): Payer: BC Managed Care – PPO | Admitting: Obstetrics and Gynecology

## 2021-01-27 ENCOUNTER — Encounter: Payer: Self-pay | Admitting: Obstetrics and Gynecology

## 2021-01-27 VITALS — BP 132/68 | HR 57 | Wt 243.0 lb

## 2021-01-27 DIAGNOSIS — N393 Stress incontinence (female) (male): Secondary | ICD-10-CM | POA: Diagnosis not present

## 2021-01-27 DIAGNOSIS — N3281 Overactive bladder: Secondary | ICD-10-CM | POA: Diagnosis not present

## 2021-01-27 DIAGNOSIS — M62838 Other muscle spasm: Secondary | ICD-10-CM | POA: Diagnosis not present

## 2021-01-27 MED ORDER — CYCLOBENZAPRINE HCL 10 MG PO TABS
10.0000 mg | ORAL_TABLET | Freq: Three times a day (TID) | ORAL | 2 refills | Status: DC | PRN
Start: 2021-01-27 — End: 2021-04-27

## 2021-01-27 NOTE — Progress Notes (Signed)
[facility] Return Visit  SUBJECTIVE  History of Present Illness: Khalessi Blough is a 60 y.o. female seen in follow-up for mixed incotinence and levator spasm. Was treated for urinary tract infection after culture was positive at last visit. She has been attending pelvic floor physical therapy.  Didn't feel any different after urinary tract infection. Still has pain with intercourse. Feels that she has already seen some improvement after two pelvic PT sessions and is very happy for the treatment.   Past Medical History: Patient  has a past medical history of Anxiety, Constipation, DEPRESSION, edema lower extremeties, GERD, Joint pain, MENOPAUSAL SYNDROME, Multiple food allergies, OBESITY, Shortness of breath, and Sleep apnea.   Past Surgical History: She  has a past surgical history that includes Cesarean section and Abdominal hysterectomy (11/1998).   Medications: She has a current medication list which includes the following prescription(s): acetaminophen, albuterol, cyclobenzaprine, iron, linaclotide, triamterene-hydrochlorothiazide, vitamin d (ergocalciferol), and amlodipine.   Allergies: Patient is allergic to pennsaid [diclofenac sodium], oxycodone, and shellfish allergy.   Social History: Patient  reports that she quit smoking about 43 years ago. Her smoking use included cigarettes. She has a 6.00 pack-year smoking history. She quit smokeless tobacco use about 33 years ago. She reports that she does not drink alcohol and does not use drugs.      OBJECTIVE     Physical Exam: Vitals:   01/27/21 1506  BP: 132/68  Pulse: (!) 57  Weight: 243 lb (110.2 kg)   Gen: No apparent distress, A&O x 3.  Detailed Urogynecologic Evaluation:  Deferred.    ASSESSMENT AND PLAN    Ms. Yearwood is a 60 y.o. with:  1. Levator spasm   2. Overactive bladder   3. SUI (stress urinary incontinence, female)    - Continue with physical therapy. For her muscle spasm, we  discussed that a muscle relaxer can be helpful in addition to PT. Prescribed flexeril 10mg  TID prn.  - She would like to wait on additional treatment for incontinence until after PT has concluded.   Follow up 4 months  Jaquita Folds, MD   Time spent: I spent 20 minutes dedicated to the care of this patient on the date of this encounter to include pre-visit review of records, face-to-face time with the patient and post visit documentation and ordering medication/ testing.

## 2021-01-28 ENCOUNTER — Ambulatory Visit (INDEPENDENT_AMBULATORY_CARE_PROVIDER_SITE_OTHER): Payer: BC Managed Care – PPO | Admitting: Family Medicine

## 2021-01-29 ENCOUNTER — Ambulatory Visit: Payer: BC Managed Care – PPO | Admitting: Physical Therapy

## 2021-01-29 ENCOUNTER — Other Ambulatory Visit: Payer: Self-pay

## 2021-01-29 VITALS — BP 112/64 | HR 69

## 2021-01-29 DIAGNOSIS — R278 Other lack of coordination: Secondary | ICD-10-CM

## 2021-01-29 DIAGNOSIS — N393 Stress incontinence (female) (male): Secondary | ICD-10-CM

## 2021-01-29 DIAGNOSIS — R2689 Other abnormalities of gait and mobility: Secondary | ICD-10-CM

## 2021-01-29 NOTE — Therapy (Signed)
[place] MAIN [facility] SERVICES [address] [place], [place], [address] Phone: [phone]   Fax:  [phone]  Physical Therapy Treatment  Patient Details  Name: Molly Bean MRN: [id number] Date of Birth: Jan 09, 1961 Referring Provider (PT): Wannetta Sender MD   Encounter Date: 01/29/2021   PT End of Session - 01/29/21 1841    Visit Number 3    Number of Visits 10    Date for PT Re-Evaluation 03/26/21    PT Start Time 6967    PT Stop Time 1800    PT Time Calculation (min) 55 min           Past Medical History:  Diagnosis Date  . Anxiety   . Constipation   . DEPRESSION   . edema lower extremeties   . GERD   . Joint pain   . MENOPAUSAL SYNDROME   . Multiple food allergies   . OBESITY   . Shortness of breath   . Sleep apnea    wears CPAP    Past Surgical History:  Procedure Laterality Date  . ABDOMINAL HYSTERECTOMY  11/1998   total fibroids  . CESAREAN SECTION      Vitals:   01/29/21 1845  BP: 112/64  Pulse: 69     Subjective Assessment - 01/29/21 1845    Subjective Pt reported she had a good week . She practiced the exercises. Pt reported she has a HA today. R temproal area. Denied nausea/ vomitting and other red flag signs    Pertinent History C-section, abdominal hysterectomy.    Patient Stated Goals decreaase leakage, have sex and get through pelvic exams without pain,              [facility] PT Assessment - 01/29/21 1843      Observation/Other Assessments   Observations poor scapular stabilization propioception      Palpation   Palpation comment supinationn/ poor WBing across tranverse arches in deep core                         [facility] Adult PT Treatment/Exercise - 01/29/21 1842      Neuro Re-ed    Neuro Re-ed Details  cued for new HEP for outer core and scapulothoracic strengthening, cued for more stability of pelvis in deep core      Manual Therapy   Manual therapy comments Distraction/  AP/PA mob Grade III , STM at B feet o promote teoe abduction/ transverse arch                       PT Long Term Goals - 01/16/21 1038      PT LONG TERM GOAL #1   Title Pt will demo increased hip abduction strength from 3/5 to >4/5 in order to maintain walking routine and pelvic stability    Time 8    Period Weeks    Status New    Target Date 03/13/21      PT LONG TERM GOAL #2   Title Pt will demo proper deepcore coordination and pelvic floor movement with no cues to progress to kegel exercises    Time 4    Period Weeks    Status New    Target Date 02/13/21      PT LONG TERM GOAL #3   Title Pt will demo decreased abdominal scar restrictions to advance to deep core exercises and promote IAP for continence and bowel movement elimination with  less dependence on Linzess    Time 4    Period Weeks    Status New    Target Date 02/13/21      PT LONG TERM GOAL #4   Title Pt will increase FOTO score for constipation from 41 pts and Urinary 47 pts to > 55 pts in order to demo improved pelvic floor function    Time 10    Period Weeks    Status New    Target Date 02/13/21      PT LONG TERM GOAL #5   Title Pt will demo decreased pelvic floor mm tightness and proper relaxation of mm to have less pain with intercourcse and gynecological exams    Time 10    Period Weeks    Status New    Target Date 03/27/21                 Plan - 01/29/21 1844    Clinical Impression Statement Pt progressed to scapulothoracic strengthening exercises to promote more upright posture, less forward head posture. Pt required cues for proper technique. Pt demo'd poor propioception of feet in deep core exercises . Post Tx, pt demo'd improved toe abduction and less supination. Plan to address gait with co-activation of deep core to help pt gain a walking routine for health and wellness and to walk with her husband. PT continues to benefit from skilled PT      Examination-Activity Limitations  Toileting;Continence;Other    Stability/Clinical Decision Making Evolving/Moderate complexity    Rehab Potential Good    PT Frequency 1x / week    PT Duration Other (comment)   10   PT Treatment/Interventions Neuromuscular re-education;Cryotherapy;ADLs/Self Care Home Management;Therapeutic activities;Functional mobility training;Traction;Moist Heat;Stair training;Gait training;Patient/family education;Taping;Manual techniques;Scar mobilization;Balance training;Therapeutic exercise;Spinal Manipulations;Joint Manipulations;Energy conservation    Consulted and Agree with Plan of Care Patient           Patient will benefit from skilled therapeutic intervention in order to improve the following deficits and impairments:  Decreased endurance,Decreased safety awareness,Difficulty walking,Decreased coordination,Abnormal gait,Improper body mechanics,Pain,Increased muscle spasms,Hypermobility,Hypomobility,Decreased scar mobility,Decreased mobility,Decreased strength,Postural dysfunction  Visit Diagnosis: Other lack of coordination  Other abnormalities of gait and mobility  Stress incontinence of urine     Problem List Patient Active Problem List   Diagnosis Date Noted  . Proteus mirabilis infection 01/16/2021  . Cutaneous abscess of right axilla 01/13/2021  . Iron deficiency anemia 12/10/2020  . At risk for side effect of medication 12/10/2020  . At risk for diarrhea 10/27/2020  . At risk for diabetes mellitus 10/14/2020  . Other constipation 08/19/2020  . At risk for dehydration 08/19/2020  . Prediabetes 08/06/2020  . Chronic constipation 08/06/2020  . Other hyperlipidemia 08/06/2020  . Vitamin D deficiency 08/06/2020  . At risk for heart disease 08/06/2020  . Dysuria 04/09/2020  . Vaginitis 04/09/2020  . Thumb pain, right 11/03/2018  . Essential hypertension 05/01/2018  . Cough 03/24/2018  . SOB (shortness of breath) 03/24/2018  . Asthma 03/14/2018  . Hidradenitis suppurativa  01/18/2018  . Generalized anxiety disorder 10/11/2016  . Chronic shoulder pain 06/09/2016  . MENOPAUSAL SYNDROME 04/23/2009  . OBESITY 02/15/2008  . Depression 04/12/2007  . Gastroesophageal reflux disease 04/12/2007  . Edema 04/12/2007    Jerl Mina ,PT, DPT, E-RYT  01/29/2021, 6:46 PM  [place] MAIN [facility] SERVICES [address] [place], [place], [address] Phone: [phone]   Fax:  [phone]  Name: Molly Bean MRN: [id number] Date of Birth: 1961-01-22

## 2021-01-29 NOTE — Patient Instructions (Signed)
Pay attention to feet with deep core   __ Place band in "U"    band under ballmounds  while laying on back w/ knees bent      Lying on back, knees bent    band under ballmounds  while laying on back w/ knees bent  "W" exercise  10 reps x 2 sets   Band is placed under feet, knees bent, feet are hip width apart Hold band with thumbs point out, keep upper arm and elbow touching the bed the whole time  - inhale and then exhale pull bands by bending elbows hands move in a "w"  (feel shoulder blades squeezing)     __________________________  Oblique/ scapula stabilization   Opposite arm   Place band in "U"    band under ballmounds  while laying on back w/ knees bent     20 reps  on each side  Holding band from opposite thigh,  Inhale,    exhale then pull band across body while keeping elbow , shoulders, back of the head pressed down    ______________

## 2021-02-04 ENCOUNTER — Encounter (INDEPENDENT_AMBULATORY_CARE_PROVIDER_SITE_OTHER): Payer: Self-pay | Admitting: Family Medicine

## 2021-02-04 ENCOUNTER — Other Ambulatory Visit: Payer: Self-pay

## 2021-02-04 ENCOUNTER — Ambulatory Visit (INDEPENDENT_AMBULATORY_CARE_PROVIDER_SITE_OTHER): Payer: BC Managed Care – PPO | Admitting: Family Medicine

## 2021-02-04 VITALS — BP 122/72 | HR 67 | Temp 98.0°F | Ht 62.0 in | Wt 241.0 lb

## 2021-02-04 DIAGNOSIS — Z9189 Other specified personal risk factors, not elsewhere classified: Secondary | ICD-10-CM

## 2021-02-04 DIAGNOSIS — F419 Anxiety disorder, unspecified: Secondary | ICD-10-CM | POA: Diagnosis not present

## 2021-02-04 DIAGNOSIS — E559 Vitamin D deficiency, unspecified: Secondary | ICD-10-CM

## 2021-02-04 DIAGNOSIS — K5909 Other constipation: Secondary | ICD-10-CM | POA: Diagnosis not present

## 2021-02-04 DIAGNOSIS — Z6841 Body Mass Index (BMI) 40.0 and over, adult: Secondary | ICD-10-CM

## 2021-02-04 DIAGNOSIS — R7303 Prediabetes: Secondary | ICD-10-CM | POA: Diagnosis not present

## 2021-02-04 MED ORDER — VITAMIN D (ERGOCALCIFEROL) 1.25 MG (50000 UNIT) PO CAPS: ORAL_CAPSULE | ORAL | 0 refills | Status: DC

## 2021-02-04 MED ORDER — LINACLOTIDE 145 MCG PO CAPS: 145.0000 ug | ORAL_CAPSULE | Freq: Every day | ORAL | 0 refills | Status: DC

## 2021-02-04 MED ORDER — METFORMIN HCL 500 MG PO TABS: 500.0000 mg | ORAL_TABLET | Freq: Two times a day (BID) | ORAL | 0 refills | Status: DC

## 2021-02-04 MED ORDER — BUPROPION HCL ER (SR) 150 MG PO TB12: 150.0000 mg | ORAL_TABLET | Freq: Every morning | ORAL | 0 refills | Status: DC

## 2021-02-05 ENCOUNTER — Ambulatory Visit: Payer: BC Managed Care – PPO | Attending: Family | Admitting: Physical Therapy

## 2021-02-05 DIAGNOSIS — R278 Other lack of coordination: Secondary | ICD-10-CM | POA: Diagnosis present

## 2021-02-05 DIAGNOSIS — R2689 Other abnormalities of gait and mobility: Secondary | ICD-10-CM | POA: Diagnosis present

## 2021-02-05 DIAGNOSIS — N393 Stress incontinence (female) (male): Secondary | ICD-10-CM | POA: Insufficient documentation

## 2021-02-05 NOTE — Therapy (Signed)
[place] MAIN [facility] SERVICES [address] [place], [place], [address] Phone: [phone]   Fax:  [phone]  Physical Therapy Treatment  Patient Details  Name: Molly Bean MRN: [id number] Date of Birth: Oct 15, 1960 Referring Provider (PT): Wannetta Sender MD   Encounter Date: 02/05/2021   PT End of Session - 02/05/21 1722    Visit Number 4    Number of Visits 10    Date for PT Re-Evaluation 03/26/21    PT Start Time 1710    PT Stop Time 1800    PT Time Calculation (min) 50 min           Past Medical History:  Diagnosis Date  . Anxiety   . Constipation   . DEPRESSION   . edema lower extremeties   . GERD   . Joint pain   . MENOPAUSAL SYNDROME   . Multiple food allergies   . OBESITY   . Shortness of breath   . Sleep apnea    wears CPAP    Past Surgical History:  Procedure Laterality Date  . ABDOMINAL HYSTERECTOMY  11/1998   total fibroids  . CESAREAN SECTION      There were no vitals filed for this visit.   Subjective Assessment - 02/05/21 1722    Subjective Pt reported no difficulties with the HEP. Pt has decreased her Molly Bean packets in water and is drinking more plain water    Pertinent History C-section, abdominal hysterectomy.    Patient Stated Goals decreaase leakage, have sex and get through pelvic exams without pain,              [facility] PT Assessment - 02/05/21 1741      Palpation   Palpation comment more flexibilty in spine and feet ( good carry over)                      Pelvic Floor Special Questions - 02/05/21 1740    External Perineal Exam through clothing, tight coccygeus B,             [facility] Adult PT Treatment/Exercise - 02/05/21 1741      Therapeutic Activites    Other Therapeutic Activities post walking stretches, assessed with FOTO for LBP, standing marches for cardio with feet/ knee  propioception      Neuro Re-ed    Neuro Re-ed Details  cued for more anterior COM,  less hyperextension of knees      Manual Therapy   Manual therapy comments STM/MWM coccygeus B                       PT Long Term Goals - 01/16/21 1038      PT LONG TERM GOAL #1   Title Pt will demo increased hip abduction strength from 3/5 to >4/5 in order to maintain walking routine and pelvic stability    Time 8    Period Weeks    Status New    Target Date 03/13/21      PT LONG TERM GOAL #2   Title Pt will demo proper deepcore coordination and pelvic floor movement with no cues to progress to kegel exercises    Time 4    Period Weeks    Status New    Target Date 02/13/21      PT LONG TERM GOAL #3   Title Pt will demo decreased abdominal scar restrictions to advance to deep core exercises and promote  IAP for continence and bowel movement elimination with less dependence on Linzess    Time 4    Period Weeks    Status New    Target Date 02/13/21      PT LONG TERM GOAL #4   Title Pt will increase FOTO score for constipation from 41 pts and Urinary 47 pts to > 55 pts in order to demo improved pelvic floor function    Time 10    Period Weeks    Status New    Target Date 02/13/21      PT LONG TERM GOAL #5   Title Pt will demo decreased pelvic floor mm tightness and proper relaxation of mm to have less pain with intercourcse and gynecological exams    Time 10    Period Weeks    Status New    Target Date 03/27/21                 Plan - 02/05/21 1755    Clinical Impression Statement Pt shows good carry over with improved flexiblity in her spine, pelvis, and hips. Focused on minimizing posterior pelvic floor mm tightness and provided stretches for walking as pt has started regular walking routine with husband for 30 min ( 1 miles) daily this past week. Pt requried cues for less hyperextension of knees which will help with less pelvic floor tightness. Stretches were modified into positions that pt was able to get into based on her body composition. Plan to  assess pelvic floor internally a next session as discussed with patient and pt preferred to wait till next session. Pt continues to benefit from skilled PT    Examination-Activity Limitations Toileting;Continence;Other    Stability/Clinical Decision Making Evolving/Moderate complexity    Rehab Potential Good    PT Frequency 1x / week    PT Duration Other (comment)   10   PT Treatment/Interventions Neuromuscular re-education;Cryotherapy;ADLs/Self Care Home Management;Therapeutic activities;Functional mobility training;Traction;Moist Heat;Stair training;Gait training;Patient/family education;Taping;Manual techniques;Scar mobilization;Balance training;Therapeutic exercise;Spinal Manipulations;Joint Manipulations;Energy conservation    Consulted and Agree with Plan of Care Patient           Patient will benefit from skilled therapeutic intervention in order to improve the following deficits and impairments:  Decreased endurance,Decreased safety awareness,Difficulty walking,Decreased coordination,Abnormal gait,Improper body mechanics,Pain,Increased muscle spasms,Hypermobility,Hypomobility,Decreased scar mobility,Decreased mobility,Decreased strength,Postural dysfunction  Visit Diagnosis: Other lack of coordination  Stress incontinence of urine  Other abnormalities of gait and mobility     Problem List Patient Active Problem List   Diagnosis Date Noted  . Proteus mirabilis infection 01/16/2021  . Cutaneous abscess of right axilla 01/13/2021  . Iron deficiency anemia 12/10/2020  . At risk for side effect of medication 12/10/2020  . At risk for diarrhea 10/27/2020  . At risk for diabetes mellitus 10/14/2020  . Other constipation 08/19/2020  . At risk for dehydration 08/19/2020  . Prediabetes 08/06/2020  . Chronic constipation 08/06/2020  . Other hyperlipidemia 08/06/2020  . Vitamin D deficiency 08/06/2020  . At risk for heart disease 08/06/2020  . Dysuria 04/09/2020  . Vaginitis  04/09/2020  . Thumb pain, right 11/03/2018  . Essential hypertension 05/01/2018  . Cough 03/24/2018  . SOB (shortness of breath) 03/24/2018  . Asthma 03/14/2018  . Hidradenitis suppurativa 01/18/2018  . GAD (generalized anxiety disorder) 10/11/2016  . Chronic shoulder pain 06/09/2016  . MENOPAUSAL SYNDROME 04/23/2009  . OBESITY 02/15/2008  . Depression 04/12/2007  . Gastroesophageal reflux disease 04/12/2007  . Edema 04/12/2007    Jerl Mina ,PT, DPT,  E-RYT  02/05/2021, 6:02 PM  [place] MAIN [facility] SERVICES [address] [place], [place], [address] Phone: [phone]   Fax:  [phone]  Name: Molly Bean MRN: [id number] Date of Birth: [birth year]

## 2021-02-05 NOTE — Patient Instructions (Addendum)
  After walking stretches  _seated: figure -4 , half on the chair/ bed/  _hamstring ( straighten knee but not locked) with heel propped on stool with towel pulling  _inner thigh stretch, foot on stool, leaning towards that leg, opposite arm up   _calf stretch standing  ( lunge position, back knee bends, lower heel)      ___       Standing posture without locking knees   Marching 3 min, lower ballmounds first     __    kitchen counter stretches  Hands on kitchen counter,   Palms shoulder width apart  Minisquat postion Trunk is parallel to floor  A) Pull buttocks back to lengthen spine, knees bent  3 breaths   B) Bring R hand to the L, and stretch the R side trunk  3 breaths   Brings hands to center again Do the same to the L side stretch by placing L hand on top of R   D) Modified thread the needle R hand on L thigh, L  thigh pushing out slightly as the R hands pull in,  elbow bent and pulls to theR,  Look under L armpit   Do the same to other side  ____    childs poses rocking   Toes tucked, shoulders down and back, on forearms , hands shoulder width apart  10 reps

## 2021-02-12 ENCOUNTER — Ambulatory Visit: Payer: BC Managed Care – PPO | Admitting: Physical Therapy

## 2021-02-12 NOTE — Progress Notes (Signed)
Chief Complaint:   OBESITY Molly Bean is here to discuss her progress with her obesity treatment plan along with follow-up of her obesity related diagnoses.   Today's visit was #: 15 Starting weight: 240 lbs Starting date: 04/15/2020 Today's weight: 241 lbs Today's date: 02/04/2021 Weight change since last visit: +3 lbs Total lbs lost to date: +1 lb Body mass index is 44.08 kg/m.   Interim History:  Molly Bean's last office visit was on 3/29.  She has had increased stress in her life and has been doing more emotional eating and had a "chip binge" recently.  Excess worry.  Unable to stay on plan due to emotional eating.  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  Worried about her future.  Current Meal Plan: following a lower carbohydrate, vegetable and lean protein rich diet plan for 90% of the time.  Current Exercise Plan: Walking for 30 minutes 5 times per week.  Assessment/Plan:   No orders of the defined types were placed in this encounter.   Medications Discontinued During This Encounter  Medication Reason  . Vitamin D, Ergocalciferol, (DRISDOL) 1.25 MG (50000 UNIT) CAPS capsule Reorder  . linaclotide (LINZESS) 145 MCG CAPS capsule Reorder     Meds ordered this encounter  Medications  . linaclotide (LINZESS) 145 MCG CAPS capsule    Sig: Take 1 capsule (145 mcg total) by mouth daily before breakfast.    Dispense:  30 capsule    Refill:  0  . Vitamin D, Ergocalciferol, (DRISDOL) 1.25 MG (50000 UNIT) CAPS capsule    Sig: 1 po q wed, and 1 po q sun    Dispense:  8 capsule    Refill:  0  . metFORMIN (GLUCOPHAGE) 500 MG tablet    Sig: Take 1 tablet (500 mg total) by mouth 2 (two) times daily with a meal.    Dispense:  60 tablet    Refill:  0  . buPROPion (WELLBUTRIN SR) 150 MG 12 hr tablet    Sig: Take 1 tablet (150 mg total) by mouth in the morning.    Dispense:  30 tablet    Refill:  0    1. Vitamin D deficiency Improving, but not optimized. Current vitamin D is  46.9, tested on 12/30/2020. Optimal goal > 50 ng/dL.  She is taking vitamin D 50,000 IU twice weekly.  Tolerating well without issues with medication.   Plan: Continue to take prescription Vitamin D @50 ,000 IU twice weekly as prescribed.  Follow-up for routine testing of Vitamin D, at least 2-3 times per year to avoid over-replacement.  - Refill Vitamin D, Ergocalciferol, (DRISDOL) 1.25 MG (50000 UNIT) CAPS capsule; 1 po q wed, and 1 po q sun  Dispense: 8 capsule; Refill: 0  2. Other constipation This problem is under good control. Molly Bean was informed that a decrease in bowel movement frequency is normal while losing weight, but stools should not be hard or painful.  She is tolerating Linzess well.  Plan:  Continue Linzess.  Will refill today, as per below.  Counseling: Getting to Good Bowel Health: Your goal is to have one soft bowel movement each day. Drink at least 8 glasses of water each day. Eat plenty of fiber (goal is over 30 grams each day). It is best to get most of your fiber from dietary sources which includes leafy green vegetables, fresh fruit, and whole grains. You may need to add fiber with the help of OTC fiber supplements. These include Metamucil, Citrucel,  and Benefiber. If you are still having trouble, try adding an osmotic laxative such as Miralax. If all of these changes do not work, Cabin crew.   - Refill linaclotide (LINZESS) 145 MCG CAPS capsule; Take 1 capsule (145 mcg total) by mouth daily before breakfast.  Dispense: 30 capsule; Refill: 0  3. Prediabetes Not at goal. Goal is HgbA1c < 5.7.  Medication: metformin 500 mg twice daily.  She is unsure why her PCP's office removed metformin from her list.  She is still taking it.  She says it helps with sweet/carb cravings.  Plan:  Continue metformin 500 mg twice daily  She will continue to focus on protein-rich, low simple carbohydrate foods. We reviewed the importance of hydration, regular exercise for stress  reduction, and restorative sleep.   Lab Results  Component Value Date   HGBA1C 6.2 (H) 12/30/2020   Lab Results  Component Value Date   INSULIN 21.9 04/15/2020   - Refill metFORMIN (GLUCOPHAGE) 500 MG tablet; Take 1 tablet (500 mg total) by mouth 2 (two) times daily with a meal.  Dispense: 60 tablet; Refill: 0  4. Anxiety, with emotional eating Not at goal. Medication: None.  Uncertain about future and retirement.  Two colleagues recently passed, one right after retirement.  Her emotions are causing emotional eating.  Plan:  Start Wellbutrin (she was on it years ago and it worked well for her).  Try daily exercise.  Consider a gratefulness journal.  Gave handouts on emotional eating and mindful eating after counseling performed. Behavior modification techniques were discussed today to help deal with emotional/non-hunger eating behaviors.  - Start buPROPion (WELLBUTRIN SR) 150 MG 12 hr tablet; Take 1 tablet (150 mg total) by mouth in the morning.  Dispense: 30 tablet; Refill: 0  5. At increased risk of exposure to COVID-19 virus Recommended that because she is a Pharmacist, hospital and has increased exposure to [place], she obtain a booster when appropriate.  Also, this is causing her to have increased stress.  We discussed stress management techniques.  She is also at high risk for depression.  She was counseled for 26 minutes today.  6. Class 3 severe obesity with serious comorbidity and body mass index (BMI) of 40.0 to 44.9 in adult, unspecified obesity type [facility])  Course: [place] is currently in the action stage of change. As such, her goal is to continue with weight loss efforts.   Nutrition goals: She has agreed to following a lower carbohydrate, vegetable and lean protein rich diet plan.   Exercise goals: As is.  Behavioral modification strategies: emotional eating strategies, avoiding temptations and planning for success.  Chrisha has agreed to follow-up with our clinic in 3 weeks. She was  informed of the importance of frequent follow-up visits to maximize her success with intensive lifestyle modifications for her multiple health conditions.   Objective:   Blood pressure 122/72, pulse 67, temperature 98 F (36.7 C), height 5\' 2"  (1.575 m), weight 241 lb (109.3 kg), SpO2 98 %. Body mass index is 44.08 kg/m.  General: Cooperative, alert, well developed, in no acute distress. HEENT: Conjunctivae and lids unremarkable. Cardiovascular: Regular rhythm.  Lungs: Normal work of breathing. Neurologic: No focal deficits.   Lab Results  Component Value Date   CREATININE 0.72 04/15/2020   BUN 8 04/15/2020   NA 143 04/15/2020   K 4.3 04/15/2020   CL 103 04/15/2020   CO2 24 04/15/2020   Lab Results  Component Value Date   ALT 14 04/15/2020  AST 14 04/15/2020   ALKPHOS 83 04/15/2020   BILITOT 0.3 04/15/2020   Lab Results  Component Value Date   HGBA1C 6.2 (H) 12/30/2020   HGBA1C 6.1 (H) 08/19/2020   HGBA1C 6.1 (H) 04/15/2020   Lab Results  Component Value Date   INSULIN 21.9 04/15/2020   Lab Results  Component Value Date   TSH 1.100 04/15/2020   Lab Results  Component Value Date   CHOL 174 12/30/2020   HDL 65 12/30/2020   LDLCALC 97 12/30/2020   LDLDIRECT 95 12/30/2020   TRIG 62 12/30/2020   CHOLHDL 2.7 12/30/2020   Lab Results  Component Value Date   WBC 9.2 12/30/2020   HGB 12.6 12/30/2020   HCT 40.6 12/30/2020   MCV 69 (L) 12/30/2020   PLT 220 12/30/2020   Lab Results  Component Value Date   IRON 51 12/30/2020   TIBC 300 12/30/2020   FERRITIN 55 12/30/2020   Attestation Statements:   Reviewed by clinician on day of visit: allergies, medications, problem list, medical history, surgical history, family history, social history, and previous encounter notes.  I, Water quality scientist, CMA, am acting as Location manager for [employer], DO.  I have reviewed the above documentation for accuracy and completeness, and I agree with the above. Marjory Sneddon, D.O.  The [place] was signed into law in 2016 which includes the topic of electronic health records.  This provides immediate access to information in MyChart.  This includes consultation notes, operative notes, office notes, lab results and pathology reports.  If you have any questions about what you read please let us know at your next visit so we can discuss your concerns and take corrective action if need be.  We are right here with you.

## 2021-02-19 ENCOUNTER — Ambulatory Visit: Payer: BC Managed Care – PPO | Admitting: Physical Therapy

## 2021-02-19 ENCOUNTER — Other Ambulatory Visit: Payer: Self-pay

## 2021-02-19 DIAGNOSIS — N393 Stress incontinence (female) (male): Secondary | ICD-10-CM

## 2021-02-19 DIAGNOSIS — R2689 Other abnormalities of gait and mobility: Secondary | ICD-10-CM

## 2021-02-19 DIAGNOSIS — R278 Other lack of coordination: Secondary | ICD-10-CM

## 2021-02-19 NOTE — Patient Instructions (Signed)
Stretch for pelvic floor   V- slides  "v heels slide away and then back toward buttocks and then rock knee to slight ,  slide heel along at 11 o clock away from buttocks   10 reps      On belly: Riding horse edge of mattress  knee bent like riding a horse, move knee towards armpit and out  10 reps   Mermaid stretch  Rocking while seated on the floor with heels to one side of the hip Heels to one side of the hip  Rock forward towards the knee that is bent , rock beck towards the opposite sitting bones  ___  Walking backward stepping  lunges  opp arm up,  Toes tucked fron knee above ankle  3 min

## 2021-02-20 NOTE — Therapy (Signed)
[place] MAIN [facility] SERVICES [address] [place], [place], [address] Phone: [phone]   Fax:  [phone]  Physical Therapy Treatment  Patient Details  Name: Molly Bean MRN: [id number] Date of Birth: 15-Jan-1961 Referring Provider (PT): Wannetta Sender MD   Encounter Date: 02/19/2021   PT End of Session - 02/19/21 1708    Visit Number 5    Number of Visits 10    Date for PT Re-Evaluation 03/26/21    PT Start Time 1703    PT Stop Time 1800    PT Time Calculation (min) 57 min           Past Medical History:  Diagnosis Date  . Anxiety   . Constipation   . DEPRESSION   . edema lower extremeties   . GERD   . Joint pain   . MENOPAUSAL SYNDROME   . Multiple food allergies   . OBESITY   . Shortness of breath   . Sleep apnea    wears CPAP    Past Surgical History:  Procedure Laterality Date  . ABDOMINAL HYSTERECTOMY  11/1998   total fibroids  . CESAREAN SECTION      There were no vitals filed for this visit.   Subjective Assessment - 02/19/21 1706    Subjective Pt reported walked alot at the [place] and she continues to walk at night with her husband.    Pertinent History C-section, abdominal hysterectomy.    Patient Stated Goals decreaase leakage, have sex and get through pelvic exams without pain,                          Pelvic Floor Special Questions - 02/20/21 1155    Pelvic Floor Internal Exam pt consented verbally and had no contraindications    Exam Type Vaginal    Palpation pain at 7-9 oclock, tightness at posterior mm , limited lengthened phase             [facility] Adult PT Treatment/Exercise - 02/20/21 1155      Neuro Re-ed    Neuro Re-ed Details  cued for stretches for pelvic floor  see pt instructions      Exercises   Exercises --   see pt instructions     Moist Heat Therapy   Number Minutes Moist Heat 5 Minutes    Moist Heat Location --   not billed     Manual Therapy   Manual  therapy comments STM/MWM at mm noted in assessment ( internally and externally)                       PT Long Term Goals - 01/16/21 1038      PT LONG TERM GOAL #1   Title Pt will demo increased hip abduction strength from 3/5 to >4/5 in order to maintain walking routine and pelvic stability    Time 8    Period Weeks    Status New    Target Date 03/13/21      PT LONG TERM GOAL #2   Title Pt will demo proper deepcore coordination and pelvic floor movement with no cues to progress to kegel exercises    Time 4    Period Weeks    Status New    Target Date 02/13/21      PT LONG TERM GOAL #3   Title Pt will demo decreased abdominal scar restrictions to advance to deep core  exercises and promote IAP for continence and bowel movement elimination with less dependence on Linzess    Time 4    Period Weeks    Status New    Target Date 02/13/21      PT LONG TERM GOAL #4   Title Pt will increase FOTO score for constipation from 41 pts and Urinary 47 pts to > 55 pts in order to demo improved pelvic floor function    Time 10    Period Weeks    Status New    Target Date 02/13/21      PT LONG TERM GOAL #5   Title Pt will demo decreased pelvic floor mm tightness and proper relaxation of mm to have less pain with intercourcse and gynecological exams    Time 10    Period Weeks    Status New    Target Date 03/27/21                 Plan - 02/20/21 1155    Clinical Impression Statement Pt is making progress with less spinal mm tightness and good carry over with upright posture which renders pt more fit for pelvic floor assessment and Tx today.   Pt demo'd increased pelvic floor mm tightness with internal assessment. Manual Tx was modified with less pressure and mobilization technique to minimize pt's pain. Pt demo'd increased lengthened phase of pelvic floor post Tx. Pt required cues for pelvic floor stretches.  Pt continues to benefit from skilled PT    Examination-Activity  Limitations Toileting;Continence;Other    Stability/Clinical Decision Making Evolving/Moderate complexity    Rehab Potential Good    PT Frequency 1x / week    PT Duration Other (comment)   10   PT Treatment/Interventions Neuromuscular re-education;Cryotherapy;ADLs/Self Care Home Management;Therapeutic activities;Functional mobility training;Traction;Moist Heat;Stair training;Gait training;Patient/family education;Taping;Manual techniques;Scar mobilization;Balance training;Therapeutic exercise;Spinal Manipulations;Joint Manipulations;Energy conservation    Consulted and Agree with Plan of Care Patient           Patient will benefit from skilled therapeutic intervention in order to improve the following deficits and impairments:  Decreased endurance,Decreased safety awareness,Difficulty walking,Decreased coordination,Abnormal gait,Improper body mechanics,Pain,Increased muscle spasms,Hypermobility,Hypomobility,Decreased scar mobility,Decreased mobility,Decreased strength,Postural dysfunction  Visit Diagnosis: No diagnosis found.     Problem List Patient Active Problem List   Diagnosis Date Noted  . Proteus mirabilis infection 01/16/2021  . Cutaneous abscess of right axilla 01/13/2021  . Iron deficiency anemia 12/10/2020  . At risk for side effect of medication 12/10/2020  . At risk for diarrhea 10/27/2020  . At risk for diabetes mellitus 10/14/2020  . Other constipation 08/19/2020  . At risk for dehydration 08/19/2020  . Prediabetes 08/06/2020  . Chronic constipation 08/06/2020  . Other hyperlipidemia 08/06/2020  . Vitamin D deficiency 08/06/2020  . At risk for heart disease 08/06/2020  . Dysuria 04/09/2020  . Vaginitis 04/09/2020  . Thumb pain, right 11/03/2018  . Essential hypertension 05/01/2018  . Cough 03/24/2018  . SOB (shortness of breath) 03/24/2018  . Asthma 03/14/2018  . Hidradenitis suppurativa 01/18/2018  . GAD (generalized anxiety disorder) 10/11/2016  . Chronic  shoulder pain 06/09/2016  . MENOPAUSAL SYNDROME 04/23/2009  . OBESITY 02/15/2008  . Depression 04/12/2007  . Gastroesophageal reflux disease 04/12/2007  . Edema 04/12/2007    Jerl Mina ,PT, DPT, E-RYT  02/20/2021, 11:57 AM  [place] MAIN [facility] SERVICES [address] [place], [place], [address] Phone: [phone]   Fax:  [phone]  Name: Molly Bean MRN: [id number] Date of Birth:  07/18/1961   

## 2021-02-26 ENCOUNTER — Ambulatory Visit: Payer: BC Managed Care – PPO | Admitting: Physical Therapy

## 2021-03-05 ENCOUNTER — Other Ambulatory Visit (INDEPENDENT_AMBULATORY_CARE_PROVIDER_SITE_OTHER): Payer: Self-pay | Admitting: Family Medicine

## 2021-03-05 ENCOUNTER — Ambulatory Visit: Payer: BC Managed Care – PPO | Admitting: Physical Therapy

## 2021-03-05 DIAGNOSIS — F419 Anxiety disorder, unspecified: Secondary | ICD-10-CM

## 2021-03-05 DIAGNOSIS — R7303 Prediabetes: Secondary | ICD-10-CM

## 2021-03-09 ENCOUNTER — Ambulatory Visit (INDEPENDENT_AMBULATORY_CARE_PROVIDER_SITE_OTHER): Payer: BC Managed Care – PPO | Admitting: Family Medicine

## 2021-03-12 ENCOUNTER — Ambulatory Visit: Payer: BC Managed Care – PPO | Attending: Family | Admitting: Physical Therapy

## 2021-03-12 ENCOUNTER — Other Ambulatory Visit: Payer: Self-pay

## 2021-03-12 DIAGNOSIS — R278 Other lack of coordination: Secondary | ICD-10-CM | POA: Insufficient documentation

## 2021-03-12 DIAGNOSIS — R2689 Other abnormalities of gait and mobility: Secondary | ICD-10-CM | POA: Insufficient documentation

## 2021-03-12 DIAGNOSIS — N393 Stress incontinence (female) (male): Secondary | ICD-10-CM | POA: Diagnosis present

## 2021-03-13 NOTE — Patient Instructions (Signed)
Practice proper pelvic floor coordination  Inhale: expand pelvic floor muscles Exhale"

## 2021-03-13 NOTE — Therapy (Signed)
[place] MAIN [facility] SERVICES [address] [place], [place], [address] Phone: [phone]   Fax:  [phone]  Physical Therapy Treatment  Patient Details  Name: Molly Bean MRN: [id number] Date of Birth: 1960/10/17 Referring Provider (PT): Wannetta Sender MD   Encounter Date: 03/12/2021   PT End of Session - 03/13/21 0842     Visit Number 6    Number of Visits 10    Date for PT Re-Evaluation 03/26/21    PT Start Time 1655    PT Stop Time 1755    PT Time Calculation (min) 60 min             Past Medical History:  Diagnosis Date   Anxiety    Constipation    DEPRESSION    edema lower extremeties    GERD    Joint pain    MENOPAUSAL SYNDROME    Multiple food allergies    OBESITY    Shortness of breath    Sleep apnea    wears CPAP    Past Surgical History:  Procedure Laterality Date   ABDOMINAL HYSTERECTOMY  11/1998   total fibroids   CESAREAN SECTION      There were no vitals filed for this visit.   Subjective Assessment - 03/12/21 1658     Subjective Pt reported she is doing her exercises. Pt is still no happy because she is still experiencing leakage    Pertinent History C-section, abdominal hysterectomy.    Patient Stated Goals decreaase leakage, have sex and get through pelvic exams without pain,                            Pelvic Floor Special Questions - 03/13/21 0843     External Palpation tightness and pain at ischiorami R , tightness at coccygeus R,    Pelvic Floor Internal Exam pt consented verbally and had no contraindications    Exam Type Vaginal    Palpation pain at 7-9 oclock, pain at 6 oclock deepest layer               [facility] Adult PT Treatment/Exercise - 03/13/21 0842       Therapeutic Activites    Other Therapeutic Activities simulated sitting with squatty potty for bowel movements: breathing and lengthening pelvic floor      Moist Heat Therapy   Moist Heat Location --    perineum, SIJ     Manual Therapy   Internal Pelvic Floor STM/MWM at 7-9 o'clock, deferred Tx at deeper layer due to pain, resorted external treatment,    Kinesiotex Facilitate Muscle      Kinesiotix   Facilitate Muscle  promote nutation on L                         PT Long Term Goals - 01/16/21 1038       PT LONG TERM GOAL #1   Title Pt will demo increased hip abduction strength from 3/5 to >4/5 in order to maintain walking routine and pelvic stability    Time 8    Period Weeks    Status New    Target Date 03/13/21      PT LONG TERM GOAL #2   Title Pt will demo proper deepcore coordination and pelvic floor movement with no cues to progress to kegel exercises    Time 4    Period Weeks  Status New    Target Date 02/13/21      PT LONG TERM GOAL #3   Title Pt will demo decreased abdominal scar restrictions to advance to deep core exercises and promote IAP for continence and bowel movement elimination with less dependence on Linzess    Time 4    Period Weeks    Status New    Target Date 02/13/21      PT LONG TERM GOAL #4   Title Pt will increase FOTO score for constipation from 41 pts and Urinary 47 pts to > 55 pts in order to demo improved pelvic floor function    Time 10    Period Weeks    Status New    Target Date 02/13/21      PT LONG TERM GOAL #5   Title Pt will demo decreased pelvic floor mm tightness and proper relaxation of mm to have less pain with intercourcse and gynecological exams    Time 10    Period Weeks    Status New    Target Date 03/27/21                   Plan - 03/13/21 0847     Clinical Impression Statement Pt demo'd tightness and tenderness at posterior pelvic floor mm. Post Tx, improvements with mobility at SIJ and pelvic floor mm were achieved. Pt requried cues for coordination.  Pt will continue to benefit from skilled PT to work on lengthening pelvic floor mm and improve pelvic floor function.     Examination-Activity Limitations Toileting;Continence;Other    Stability/Clinical Decision Making Evolving/Moderate complexity    Rehab Potential Good    PT Frequency 1x / week    PT Duration Other (comment)   10   PT Treatment/Interventions Neuromuscular re-education;Cryotherapy;ADLs/Self Care Home Management;Therapeutic activities;Functional mobility training;Traction;Moist Heat;Stair training;Gait training;Patient/family education;Taping;Manual techniques;Scar mobilization;Balance training;Therapeutic exercise;Spinal Manipulations;Joint Manipulations;Energy conservation    Consulted and Agree with Plan of Care Patient             Patient will benefit from skilled therapeutic intervention in order to improve the following deficits and impairments:  Decreased endurance, Decreased safety awareness, Difficulty walking, Decreased coordination, Abnormal gait, Improper body mechanics, Pain, Increased muscle spasms, Hypermobility, Hypomobility, Decreased scar mobility, Decreased mobility, Decreased strength, Postural dysfunction  Visit Diagnosis: No diagnosis found.     Problem List Patient Active Problem List   Diagnosis Date Noted   Proteus mirabilis infection 01/16/2021   Cutaneous abscess of right axilla 01/13/2021   Iron deficiency anemia 12/10/2020   At risk for side effect of medication 12/10/2020   At risk for diarrhea 10/27/2020   At risk for diabetes mellitus 10/14/2020   Other constipation 08/19/2020   At risk for dehydration 08/19/2020   Prediabetes 08/06/2020   Chronic constipation 08/06/2020   Other hyperlipidemia 08/06/2020   Vitamin D deficiency 08/06/2020   At risk for heart disease 08/06/2020   Dysuria 04/09/2020   Vaginitis 04/09/2020   Thumb pain, right 11/03/2018   Essential hypertension 05/01/2018   Cough 03/24/2018   SOB (shortness of breath) 03/24/2018   Asthma 03/14/2018   Hidradenitis suppurativa 01/18/2018   GAD (generalized anxiety disorder)  10/11/2016   Chronic shoulder pain 06/09/2016   MENOPAUSAL SYNDROME 04/23/2009   OBESITY 02/15/2008   Depression 04/12/2007   Gastroesophageal reflux disease 04/12/2007   Edema 04/12/2007    Jerl Mina ,PT, DPT, E-RYT  03/13/2021, 8:48 AM  [place] MAIN [facility] SERVICES [place]  [place], [place], [address] Phone: [phone]   Fax:  [phone]  Name: Molly Bean MRN: [id number] Date of Birth: 09/08/1961

## 2021-03-16 ENCOUNTER — Ambulatory Visit (INDEPENDENT_AMBULATORY_CARE_PROVIDER_SITE_OTHER): Payer: BC Managed Care – PPO | Admitting: Family Medicine

## 2021-03-16 ENCOUNTER — Other Ambulatory Visit: Payer: Self-pay

## 2021-03-16 ENCOUNTER — Encounter (INDEPENDENT_AMBULATORY_CARE_PROVIDER_SITE_OTHER): Payer: Self-pay | Admitting: Family Medicine

## 2021-03-16 VITALS — BP 122/73 | HR 70 | Temp 98.2°F | Ht 62.0 in | Wt 240.0 lb

## 2021-03-16 DIAGNOSIS — E559 Vitamin D deficiency, unspecified: Secondary | ICD-10-CM | POA: Diagnosis not present

## 2021-03-16 DIAGNOSIS — I10 Essential (primary) hypertension: Secondary | ICD-10-CM

## 2021-03-16 DIAGNOSIS — Z6841 Body Mass Index (BMI) 40.0 and over, adult: Secondary | ICD-10-CM

## 2021-03-16 DIAGNOSIS — K5909 Other constipation: Secondary | ICD-10-CM

## 2021-03-16 DIAGNOSIS — R7303 Prediabetes: Secondary | ICD-10-CM | POA: Diagnosis not present

## 2021-03-16 DIAGNOSIS — Z9189 Other specified personal risk factors, not elsewhere classified: Secondary | ICD-10-CM | POA: Diagnosis not present

## 2021-03-16 MED ORDER — LINACLOTIDE 145 MCG PO CAPS: 145.0000 ug | ORAL_CAPSULE | Freq: Every day | ORAL | 0 refills | Status: DC

## 2021-03-16 MED ORDER — OZEMPIC (0.25 OR 0.5 MG/DOSE) 2 MG/1.5ML ~~LOC~~ SOPN: 0.5000 mg | PEN_INJECTOR | SUBCUTANEOUS | 0 refills | Status: DC

## 2021-03-16 MED ORDER — VITAMIN D (ERGOCALCIFEROL) 1.25 MG (50000 UNIT) PO CAPS: ORAL_CAPSULE | ORAL | 0 refills | Status: DC

## 2021-03-16 MED ORDER — METFORMIN HCL 500 MG PO TABS: 500.0000 mg | ORAL_TABLET | Freq: Two times a day (BID) | ORAL | 0 refills | Status: DC

## 2021-03-23 NOTE — Progress Notes (Signed)
Chief Complaint:   OBESITY Molly Bean is here to discuss her progress with her obesity treatment plan along with follow-up of her obesity related diagnoses.   Today's visit was #: 1 Starting weight: 240 lbs Starting date: 04/15/2020 Today's weight: 240 lbs Today's date: 03/16/2021 Weight change since last visit: -1 lbs Total lbs lost to date: 0 lbs Body mass index is 43.9 kg/m.  Total weight loss percentage to date: 0%  Interim History: Molly Bean reports that her meal plan is going well, and she is not having any issues with it right now. She is currently only drinking a maximum of 4 bottles of water each day. She does not have any issues she wishes to discuss right now other than the possibility of trying Ozempic. She reports that she had a friend that had some success with this medication, and she wanted to discuss changing her regimen to include Ozempic.   Current Meal Plan: following a lower carbohydrate, vegetable and lean protein rich diet plan for 90% of the time.  Current Exercise Plan: walking for 1 mile, which takes her approximately 20-30 minutes 5 times per week.  Assessment/Plan:   Orders Placed This Encounter  Procedures   Basic metabolic panel   Insulin, random   Hemoglobin A1c    Medications Discontinued During This Encounter  Medication Reason   linaclotide (LINZESS) 145 MCG CAPS capsule Reorder   Vitamin D, Ergocalciferol, (DRISDOL) 1.25 MG (50000 UNIT) CAPS capsule Reorder   metFORMIN (GLUCOPHAGE) 500 MG tablet Reorder     Meds ordered this encounter  Medications   Vitamin D, Ergocalciferol, (DRISDOL) 1.25 MG (50000 UNIT) CAPS capsule    Sig: 1 po q wed, and 1 po q sun    Dispense:  8 capsule    Refill:  0   linaclotide (LINZESS) 145 MCG CAPS capsule    Sig: Take 1 capsule (145 mcg total) by mouth daily before breakfast.    Dispense:  30 capsule    Refill:  0   metFORMIN (GLUCOPHAGE) 500 MG tablet    Sig: Take 1 tablet (500 mg total) by mouth 2  (two) times daily with a meal.    Dispense:  60 tablet    Refill:  0   Semaglutide,0.25 or 0.5MG /DOS, (OZEMPIC, 0.25 OR 0.5 MG/DOSE,) 2 MG/1.5ML SOPN    Sig: Inject 0.5 mg into the skin once a week.    Dispense:  1.5 mL    Refill:  0      1. Vitamin D deficiency Not at goal. Current vitamin D is 46.9, tested on 12/30/20. Optimal goal > 50 ng/dL.   Plan: Continue to take prescription Vitamin D @50 ,000 IU every week as prescribed.  Follow-up for routine testing of Vitamin D, at least 2-3 times per year to avoid over-replacement.  Refill medication as per below:  - Refill: Vitamin D, Ergocalciferol, (DRISDOL) 1.25 MG (50000 UNIT) CAPS capsule; 1 po q wed, and 1 po q sun  Dispense: 8 capsule; Refill: 0  2. Other constipation Molly Bean has not had any issues recently. She reports that Linzess has been working great. She is only having 4 bottles of water each day still.  Plan: Increase water intake and increase activity, and refill Linzess. Refill medication as per below:  - linaclotide (LINZESS) 145 MCG CAPS capsule; Take 1 capsule (145 mcg total) by mouth daily before breakfast.  Dispense: 30 capsule; Refill: 0  - Basic metabolic panel  3. Prediabetes Not at goal. Goal is HgbA1c <  5.7.  Medication: Metformin 500mg  BID.    Plan:  She will continue to focus on protein-rich, low simple carbohydrate foods. We reviewed the importance of hydration, regular exercise for stress reduction, and restorative sleep, and start Ozempic 0.5mg  subcutaneously once weekly. She was instructed that she may start the first week with an injection of 0.25mg  subcutaneously. We will check her HbA1c, fasting insulin, and BMP at her next visit.  Lab Results  Component Value Date   HGBA1C 6.2 (H) 12/30/2020   Lab Results  Component Value Date   INSULIN 21.9 04/15/2020   Refill medication as per below:  - Refill: metFORMIN (GLUCOPHAGE) 500 MG tablet; Take 1 tablet (500 mg total) by mouth 2 (two) times daily  with a meal.  Dispense: 60 tablet; Refill: 0  - Start: Ozempic (Semuglutide) 0.25/0.5mg /1.15mL (2mg /1.97mL) once weekly injection. Inject 0.5mg  under the skin once weekly. Disp: 1.82mL Refills: 0  - Basic metabolic panel - Insulin, random - Hemoglobin A1c  4. Essential hypertension At goal. Medications: Amlodipine 10mg  daily and Maxzide 37.5-25mg  0.5-1 tablet daily.  Plan: Avoid buying foods that are: processed, frozen, or prepackaged to avoid excess salt. We will continue to monitor closely alongside her PCP and/or Specialist.  Regular follow up with PCP and specialists was also encouraged.   BP Readings from Last 3 Encounters:  03/16/21 122/73  02/04/21 122/72  01/29/21 112/64   Lab Results  Component Value Date   CREATININE 0.72 04/15/2020    5. At risk for diabetes mellitus - Molly Bean was given diabetes prevention education and counseling today of more than 22 minutes.  - Counseled patient on pathophysiology of disease and meaning/ implication of lab results.  - Reviewed how certain foods can either stimulate or inhibit insulin release, and subsequently affect hunger pathways  - Importance of following a healthy meal plan with limiting amounts of simple carbohydrates discussed with patient - Effects of regular aerobic exercise on blood sugar regulation reviewed and encouraged an eventual goal of 30 min 5d/week or more as a minimum.  - Briefly discussed treatment options, which always include dietary and lifestyle modification as first line.   - Handouts provided at patient's desire and/or told to go online to the American Diabetes Association website for further information.  6. Class 3 severe obesity due to excess calories with serious comorbidity and body mass index (BMI) of 40.0 to 44.9 in adult [facility])  Course: Molly Bean is currently in the action stage of change. As such, her goal is to continue with weight loss efforts. We discussed the risks versus the benefits of Ozempic, and  she has agreed to start this.    Nutrition goals: She has agreed to following a lower carbohydrate, vegetable and lean protein rich diet plan.   Exercise goals:  As is, and increase as tolerated  Behavioral modification strategies: increasing lean protein intake, decreasing simple carbohydrates, and meal planning and cooking strategies.  Molly Bean has agreed to follow-up with our clinic in 3 weeks. She was informed of the importance of frequent follow-up visits to maximize her success with intensive lifestyle modifications for her multiple health conditions.   Molly Bean was informed we would discuss her lab results at her next visit unless there is a critical issue that needs to be addressed sooner. Molly Bean agreed to keep her next visit at the agreed upon time to discuss these results.  Objective:   Blood pressure 122/73, pulse 70, temperature 98.2 F (36.8 C), height 5\' 2"  (1.575 m), weight 240 lb (108.9  kg), SpO2 97 %. Body mass index is 43.9 kg/m.  General: Cooperative, alert, well developed, in no acute distress. HEENT: Conjunctivae and lids unremarkable. Cardiovascular: Regular rhythm.  Lungs: Normal work of breathing. Neurologic: No focal deficits.   Lab Results  Component Value Date   CREATININE 0.72 04/15/2020   BUN 8 04/15/2020   NA 143 04/15/2020   K 4.3 04/15/2020   CL 103 04/15/2020   CO2 24 04/15/2020   Lab Results  Component Value Date   ALT 14 04/15/2020   AST 14 04/15/2020   ALKPHOS 83 04/15/2020   BILITOT 0.3 04/15/2020   Lab Results  Component Value Date   HGBA1C 6.2 (H) 12/30/2020   HGBA1C 6.1 (H) 08/19/2020   HGBA1C 6.1 (H) 04/15/2020   Lab Results  Component Value Date   INSULIN 21.9 04/15/2020   Lab Results  Component Value Date   TSH 1.100 04/15/2020   Lab Results  Component Value Date   CHOL 174 12/30/2020   HDL 65 12/30/2020   LDLCALC 97 12/30/2020   LDLDIRECT 95 12/30/2020   TRIG 62 12/30/2020   CHOLHDL 2.7 12/30/2020   Lab  Results  Component Value Date   WBC 9.2 12/30/2020   HGB 12.6 12/30/2020   HCT 40.6 12/30/2020   MCV 69 (L) 12/30/2020   PLT 220 12/30/2020   Lab Results  Component Value Date   IRON 51 12/30/2020   TIBC 300 12/30/2020   FERRITIN 55 12/30/2020   Attestation Statements:   Reviewed by clinician on day of visit: allergies, medications, problem list, medical history, surgical history, family history, social history, and previous encounter notes.  I, Wyatt Haste, LPN am acting as Location manager for [employer], DO.  I have reviewed the above documentation for accuracy and completeness, and I agree with the above. -Marjory Sneddon, D.O.  The [place] was signed into law in 2016 which includes the topic of electronic health records.  This provides immediate access to information in MyChart.  This includes consultation notes, operative notes, office notes, lab results and pathology reports.  If you have any questions about what you read please let us know at your next visit so we can discuss your concerns and take corrective action if need be.  We are right here with you.

## 2021-03-24 LAB — HEMOGLOBIN A1C
Est. average glucose Bld gHb Est-mCnc: 126 mg/dL
Hgb A1c MFr Bld: 6 % — ABNORMAL HIGH (ref 4.8–5.6)

## 2021-03-24 LAB — BASIC METABOLIC PANEL
BUN/Creatinine Ratio: 16 (ref 12–28)
BUN: 13 mg/dL (ref 8–27)
CO2: 25 mmol/L (ref 20–29)
Calcium: 9.5 mg/dL (ref 8.7–10.3)
Chloride: 104 mmol/L (ref 96–106)
Creatinine, Ser: 0.81 mg/dL (ref 0.57–1.00)
Glucose: 92 mg/dL (ref 65–99)
Potassium: 4.3 mmol/L (ref 3.5–5.2)
Sodium: 141 mmol/L (ref 134–144)
eGFR: 83 mL/min/{1.73_m2} (ref >59)

## 2021-03-24 LAB — INSULIN, RANDOM: INSULIN: 21.1 u[IU]/mL (ref 2.6–24.9)

## 2021-04-08 ENCOUNTER — Other Ambulatory Visit (INDEPENDENT_AMBULATORY_CARE_PROVIDER_SITE_OTHER): Payer: Self-pay | Admitting: Family Medicine

## 2021-04-08 DIAGNOSIS — E559 Vitamin D deficiency, unspecified: Secondary | ICD-10-CM

## 2021-04-08 NOTE — Telephone Encounter (Signed)
Dr.Opalski ?

## 2021-04-09 ENCOUNTER — Other Ambulatory Visit: Payer: Self-pay

## 2021-04-09 ENCOUNTER — Ambulatory Visit: Payer: BC Managed Care – PPO | Attending: Family | Admitting: Physical Therapy

## 2021-04-09 DIAGNOSIS — R278 Other lack of coordination: Secondary | ICD-10-CM | POA: Insufficient documentation

## 2021-04-09 DIAGNOSIS — R2689 Other abnormalities of gait and mobility: Secondary | ICD-10-CM | POA: Diagnosis present

## 2021-04-09 DIAGNOSIS — N393 Stress incontinence (female) (male): Secondary | ICD-10-CM

## 2021-04-10 ENCOUNTER — Other Ambulatory Visit (INDEPENDENT_AMBULATORY_CARE_PROVIDER_SITE_OTHER): Payer: Self-pay | Admitting: Family Medicine

## 2021-04-10 DIAGNOSIS — R7303 Prediabetes: Secondary | ICD-10-CM

## 2021-04-10 NOTE — Therapy (Signed)
[place] MAIN [facility] SERVICES [address] [place], [place], [address] Phone: [phone]   Fax:  [phone]  Physical Therapy Treatment  Patient Details  Name: Molly Bean MRN: [id number] Date of Birth: 1961/07/11 Referring Provider (PT): Wannetta Sender MD   Encounter Date: 04/09/2021   PT End of Session - 04/09/21 1753     Visit Number 7    Date for PT Re-Evaluation 06/18/21    PT Start Time 1700    PT Stop Time 1800    PT Time Calculation (min) 60 min             Past Medical History:  Diagnosis Date   Anxiety    Constipation    DEPRESSION    edema lower extremeties    GERD    Joint pain    MENOPAUSAL SYNDROME    Multiple food allergies    OBESITY    Shortness of breath    Sleep apnea    wears CPAP    Past Surgical History:  Procedure Laterality Date   ABDOMINAL HYSTERECTOMY  11/1998   total fibroids   CESAREAN SECTION      There were no vitals filed for this visit.   Subjective Assessment - 04/09/21 1707     Subjective Pt reported she is having less leakage. Pt has been walking    Pertinent History C-section, abdominal hysterectomy.    Patient Stated Goals decreaase leakage, have sex and get through pelvic exams without pain,                [facility] PT Assessment - 04/09/21 1749       Palpation   Palpation comment increased mm tightness at R adductors                        Pelvic Floor Special Questions - 04/09/21 1751     Pelvic Floor Internal Exam pt consented verbally and had no contraindications    Exam Type Vaginal    Palpation pain/ tightness at 6-7 o'clock deepest layer               [facility] Adult PT Treatment/Exercise - 04/09/21 1751       Therapeutic Activites    Other Therapeutic Activities guided relaxation      Moist Heat Therapy   Number Minutes Moist Heat 5 Minutes    Moist Heat Location --   perineum, adductors     Manual Therapy   Internal Pelvic Floor  STM/MWM at 6-7 o'clock with decreased pressure distally and lighter pressure                         PT Long Term Goals - 04/09/21 1754       PT LONG TERM GOAL #1   Title Pt will demo increased hip abduction strength from 3/5 to >4/5 in order to maintain walking routine and pelvic stability    Time 8    Period Weeks    Status On-going      PT LONG TERM GOAL #2   Title Pt will demo proper deepcore coordination and pelvic floor movement with no cues to progress to kegel exercises    Time 4    Period Weeks    Status Achieved      PT LONG TERM GOAL #3   Title Pt will demo decreased abdominal scar restrictions to advance to deep core exercises and promote IAP for  continence and bowel movement elimination with less dependence on Linzess    Time 4    Period Weeks    Status Achieved      PT LONG TERM GOAL #4   Title Pt will increase FOTO score for constipation from 41 pts and Urinary 47 pts to > 55 pts in order to demo improved pelvic floor function    Time 10    Period Weeks    Status On-going      PT LONG TERM GOAL #5   Title Pt will demo decreased pelvic floor mm tightness and proper relaxation of mm to have less pain with intercourcse and gynecological exams    Time 10    Period Weeks    Status On-going                   Plan - 04/09/21 1753     Clinical Impression Statement Pt has achieved 2/5  goals and progressing well to remaining goals. Pt reports she is noticing some improvement with leakage and is having better bowel movements. Pt 's posture abdominal scars have improved. Pelvic floor mm tightness and tenderness remain areas of focus to improve on.Pt is able to coordinate deep core system which will help with IAP for pelvic floor function.  Anticipate with more skilled PT, pt will achieve her goals and regain pelvic floor function with urinary . Bowel, and sexual function. Pt remains compliant with HEP. Pt benefits from skilled PT.     Examination-Activity Limitations Toileting;Continence;Other    Stability/Clinical Decision Making Evolving/Moderate complexity    Clinical Decision Making Moderate    Rehab Potential Good    PT Frequency 1x / week    PT Duration Other (comment)   10   PT Treatment/Interventions Neuromuscular re-education;Cryotherapy;ADLs/Self Care Home Management;Therapeutic activities;Functional mobility training;Traction;Moist Heat;Stair training;Gait training;Patient/family education;Taping;Manual techniques;Scar mobilization;Balance training;Therapeutic exercise;Spinal Manipulations;Joint Manipulations;Energy conservation    Consulted and Agree with Plan of Care Patient             Patient will benefit from skilled therapeutic intervention in order to improve the following deficits and impairments:  Decreased endurance, Decreased safety awareness, Difficulty walking, Decreased coordination, Abnormal gait, Improper body mechanics, Pain, Increased muscle spasms, Hypermobility, Hypomobility, Decreased scar mobility, Decreased mobility, Decreased strength, Postural dysfunction  Visit Diagnosis: Other lack of coordination  Stress incontinence of urine  Other abnormalities of gait and mobility     Problem List Patient Active Problem List   Diagnosis Date Noted   Proteus mirabilis infection 01/16/2021   Cutaneous abscess of right axilla 01/13/2021   Iron deficiency anemia 12/10/2020   At risk for side effect of medication 12/10/2020   At risk for diarrhea 10/27/2020   At risk for diabetes mellitus 10/14/2020   Other constipation 08/19/2020   At risk for dehydration 08/19/2020   Prediabetes 08/06/2020   Chronic constipation 08/06/2020   Other hyperlipidemia 08/06/2020   Vitamin D deficiency 08/06/2020   At risk for heart disease 08/06/2020   Dysuria 04/09/2020   Vaginitis 04/09/2020   Thumb pain, right 11/03/2018   Essential hypertension 05/01/2018   Cough 03/24/2018   SOB (shortness of  breath) 03/24/2018   Asthma 03/14/2018   Hidradenitis suppurativa 01/18/2018   GAD (generalized anxiety disorder) 10/11/2016   Chronic shoulder pain 06/09/2016   MENOPAUSAL SYNDROME 04/23/2009   OBESITY 02/15/2008   Depression 04/12/2007   Gastroesophageal reflux disease 04/12/2007   Edema 04/12/2007    Jerl Mina ,PT, DPT, E-RYT  04/10/2021, 9:59 AM  [place] MAIN [facility] SERVICES [address] [place], [place], [address] Phone: [phone]   Fax:  [phone]  Name: Molly Bean MRN: [id number] Date of Birth: 04/13/1961

## 2021-04-13 ENCOUNTER — Ambulatory Visit (INDEPENDENT_AMBULATORY_CARE_PROVIDER_SITE_OTHER): Payer: BC Managed Care – PPO | Admitting: Family Medicine

## 2021-04-13 ENCOUNTER — Other Ambulatory Visit: Payer: Self-pay

## 2021-04-13 VITALS — BP 121/80 | HR 74 | Temp 98.1°F | Ht 62.0 in | Wt 240.0 lb

## 2021-04-13 DIAGNOSIS — R7303 Prediabetes: Secondary | ICD-10-CM

## 2021-04-13 DIAGNOSIS — F419 Anxiety disorder, unspecified: Secondary | ICD-10-CM | POA: Diagnosis not present

## 2021-04-13 DIAGNOSIS — Z6841 Body Mass Index (BMI) 40.0 and over, adult: Secondary | ICD-10-CM

## 2021-04-13 DIAGNOSIS — Z9189 Other specified personal risk factors, not elsewhere classified: Secondary | ICD-10-CM | POA: Diagnosis not present

## 2021-04-13 DIAGNOSIS — E559 Vitamin D deficiency, unspecified: Secondary | ICD-10-CM

## 2021-04-13 DIAGNOSIS — K5909 Other constipation: Secondary | ICD-10-CM

## 2021-04-13 MED ORDER — METFORMIN HCL 500 MG PO TABS: 500.0000 mg | ORAL_TABLET | Freq: Every day | ORAL | 0 refills | Status: DC

## 2021-04-13 MED ORDER — OZEMPIC (0.25 OR 0.5 MG/DOSE) 2 MG/1.5ML ~~LOC~~ SOPN: 0.5000 mg | PEN_INJECTOR | SUBCUTANEOUS | 0 refills | Status: DC

## 2021-04-13 MED ORDER — BUPROPION HCL ER (SR) 150 MG PO TB12: 150.0000 mg | ORAL_TABLET | Freq: Every morning | ORAL | 0 refills | Status: DC

## 2021-04-13 MED ORDER — VITAMIN D (ERGOCALCIFEROL) 1.25 MG (50000 UNIT) PO CAPS: ORAL_CAPSULE | ORAL | 0 refills | Status: DC

## 2021-04-13 NOTE — Telephone Encounter (Signed)
Last OV with Dr Opalski 

## 2021-04-16 ENCOUNTER — Ambulatory Visit: Payer: BC Managed Care – PPO | Admitting: Physical Therapy

## 2021-04-23 ENCOUNTER — Ambulatory Visit: Payer: BC Managed Care – PPO | Admitting: Physical Therapy

## 2021-04-23 ENCOUNTER — Other Ambulatory Visit: Payer: Self-pay

## 2021-04-23 DIAGNOSIS — R2689 Other abnormalities of gait and mobility: Secondary | ICD-10-CM

## 2021-04-23 DIAGNOSIS — N393 Stress incontinence (female) (male): Secondary | ICD-10-CM

## 2021-04-23 DIAGNOSIS — R278 Other lack of coordination: Secondary | ICD-10-CM

## 2021-04-23 NOTE — Patient Instructions (Signed)
childs poses rocking with pillow under belly if need more support/ relaxation      childs poses rocking   Toes tucked, shoulders down and back, on forearms , hands shoulder width apart  10 reps    __   Swimmers legs ( 15 deg off ttable)  Pillow under belly  Exhale to left leg  20 reps ( L/R =1 rep)    ___  Sitting at work:  Feet on the ground, anterior tilt of pelvis , not propped on slanted stepper   Take breaks every 90 min

## 2021-04-23 NOTE — Therapy (Signed)
[place] MAIN [facility] SERVICES [address] [place], [place], [address] Phone: [phone]   Fax:  [phone]  Physical Therapy Treatment  Patient Details  Name: Molly Bean MRN: [id number] Date of Birth: 08/06/1961 Referring Provider (PT): Wannetta Sender MD   Encounter Date: 04/23/2021   PT End of Session - 04/23/21 1814     Visit Number 8    Date for PT Re-Evaluation 06/18/21    PT Start Time 6962    PT Stop Time 1803    PT Time Calculation (min) 58 min    Activity Tolerance Patient tolerated treatment well    Behavior During Therapy [facility] for tasks assessed/performed             Past Medical History:  Diagnosis Date   Anxiety    Constipation    DEPRESSION    edema lower extremeties    GERD    Joint pain    MENOPAUSAL SYNDROME    Multiple food allergies    OBESITY    Shortness of breath    Sleep apnea    wears CPAP    Past Surgical History:  Procedure Laterality Date   ABDOMINAL HYSTERECTOMY  11/1998   total fibroids   CESAREAN SECTION      There were no vitals filed for this visit.   Subjective Assessment - 04/23/21 1708     Subjective Pt reported she noticed some days are worse than others. It is better compared to when she began PT. Pt's back bothered her last Monday and by Thursday, it was hurting still.    Pertinent History C-section, abdominal hysterectomy.    Patient Stated Goals decreaase leakage, have sex and get through pelvic exams without pain,                [facility] PT Assessment - 04/23/21 1833       Palpation   SI assessment  tightness at coccygeus, coccyx                        Pelvic Floor Special Questions - 04/23/21 1710     Pelvic Floor Internal Exam pt consented verbally and had no contraindications    Exam Type Vaginal    Palpation tightness/ tenderness at obt int B               [facility] Adult PT Treatment/Exercise - 04/23/21 1836       Therapeutic Activites     Other Therapeutic Activities proper sitting posture at work, not propping foot on stool to avoid sacral siting, taking breaks      Neuro Re-ed    Neuro Re-ed Details  cued for hip ext to promote sacral nutation      Moist Heat Therapy   Number Minutes Moist Heat 5 Minutes    Moist Heat Location --   sacrum     Manual Therapy   Internal Pelvic Floor STM/MWM at B obt int                         PT Long Term Goals - 04/09/21 1754       PT LONG TERM GOAL #1   Title Pt will demo increased hip abduction strength from 3/5 to >4/5 in order to maintain walking routine and pelvic stability    Time 8    Period Weeks    Status On-going      PT LONG TERM GOAL #  2   Title Pt will demo proper deepcore coordination and pelvic floor movement with no cues to progress to kegel exercises    Time 4    Period Weeks    Status Achieved      PT LONG TERM GOAL #3   Title Pt will demo decreased abdominal scar restrictions to advance to deep core exercises and promote IAP for continence and bowel movement elimination with less dependence on Linzess    Time 4    Period Weeks    Status Achieved      PT LONG TERM GOAL #4   Title Pt will increase FOTO score for constipation from 41 pts and Urinary 47 pts to > 55 pts in order to demo improved pelvic floor function    Time 10    Period Weeks    Status On-going      PT LONG TERM GOAL #5   Title Pt will demo decreased pelvic floor mm tightness and proper relaxation of mm to have less pain with intercourcse and gynecological exams    Time 10    Period Weeks    Status On-going                   Plan - 04/23/21 1829     Clinical Impression Statement Pt demo'd decreased tightness of pelvic floor and showed remainder tightness at B obturator internus. Pt  also showed increased tightness at coccyx and limited nutation of sacrum due to improper sitting at work. Pt demo'd IND with new HEP to promote sacral nutation. Anticipate back  pain, pelvic pain, bowelmovements will improve with these improvements and her natural lordotic curves are restored.   Pt continues to benefit from skilled PT   Examination-Activity Limitations Toileting;Continence;Other    Stability/Clinical Decision Making Evolving/Moderate complexity    Rehab Potential Good    PT Frequency 1x / week    PT Duration Other (comment)   10   PT Treatment/Interventions Neuromuscular re-education;Cryotherapy;ADLs/Self Care Home Management;Therapeutic activities;Functional mobility training;Traction;Moist Heat;Stair training;Gait training;Patient/family education;Taping;Manual techniques;Scar mobilization;Balance training;Therapeutic exercise;Spinal Manipulations;Joint Manipulations;Energy conservation    Consulted and Agree with Plan of Care Patient             Patient will benefit from skilled therapeutic intervention in order to improve the following deficits and impairments:  Decreased endurance, Decreased safety awareness, Difficulty walking, Decreased coordination, Abnormal gait, Improper body mechanics, Pain, Increased muscle spasms, Hypermobility, Hypomobility, Decreased scar mobility, Decreased mobility, Decreased strength, Postural dysfunction  Visit Diagnosis: Other lack of coordination  Stress incontinence of urine  Other abnormalities of gait and mobility     Problem List Patient Active Problem List   Diagnosis Date Noted   Proteus mirabilis infection 01/16/2021   Cutaneous abscess of right axilla 01/13/2021   Iron deficiency anemia 12/10/2020   At risk for side effect of medication 12/10/2020   At risk for diarrhea 10/27/2020   At risk for diabetes mellitus 10/14/2020   Other constipation 08/19/2020   At risk for dehydration 08/19/2020   Prediabetes 08/06/2020   Chronic constipation 08/06/2020   Other hyperlipidemia 08/06/2020   Vitamin D deficiency 08/06/2020   At risk for heart disease 08/06/2020   Dysuria 04/09/2020   Vaginitis  04/09/2020   Thumb pain, right 11/03/2018   Essential hypertension 05/01/2018   Cough 03/24/2018   SOB (shortness of breath) 03/24/2018   Asthma 03/14/2018   Hidradenitis suppurativa 01/18/2018   GAD (generalized anxiety disorder) 10/11/2016   Chronic shoulder pain 06/09/2016  MENOPAUSAL SYNDROME 04/23/2009   OBESITY 02/15/2008   Depression 04/12/2007   Gastroesophageal reflux disease 04/12/2007   Edema 04/12/2007    Jerl Mina ,PT, DPT, E-RYT  04/23/2021, 6:38 PM  [place] MAIN [facility] SERVICES [address] [place], [place], [address] Phone: [phone]   Fax:  [phone]  Name: Molly Bean MRN: [id number] Date of Birth: 12-Mar-1961

## 2021-04-27 ENCOUNTER — Encounter: Payer: Self-pay | Admitting: Internal Medicine

## 2021-04-27 ENCOUNTER — Other Ambulatory Visit: Payer: Self-pay

## 2021-04-27 ENCOUNTER — Ambulatory Visit (INDEPENDENT_AMBULATORY_CARE_PROVIDER_SITE_OTHER): Payer: BC Managed Care – PPO | Admitting: Internal Medicine

## 2021-04-27 VITALS — BP 130/82 | HR 64 | Ht 62.0 in | Wt 237.6 lb

## 2021-04-27 DIAGNOSIS — R0602 Shortness of breath: Secondary | ICD-10-CM

## 2021-04-27 DIAGNOSIS — R0683 Snoring: Secondary | ICD-10-CM | POA: Diagnosis not present

## 2021-04-27 DIAGNOSIS — I10 Essential (primary) hypertension: Secondary | ICD-10-CM | POA: Diagnosis not present

## 2021-04-27 DIAGNOSIS — E7800 Pure hypercholesterolemia, unspecified: Secondary | ICD-10-CM

## 2021-04-27 DIAGNOSIS — G4733 Obstructive sleep apnea (adult) (pediatric): Secondary | ICD-10-CM

## 2021-04-27 DIAGNOSIS — Z6841 Body Mass Index (BMI) 40.0 and over, adult: Secondary | ICD-10-CM

## 2021-04-27 DIAGNOSIS — Z79899 Other long term (current) drug therapy: Secondary | ICD-10-CM

## 2021-04-27 MED ORDER — ROSUVASTATIN CALCIUM 5 MG PO TABS: 5.0000 mg | ORAL_TABLET | Freq: Every day | ORAL | 3 refills | Status: DC

## 2021-04-27 NOTE — Patient Instructions (Signed)
Medication Instructions:  START: CRESTOR (ROSUVASTATIN) '5mg'$  DAILY  *If you need a refill on your cardiac medications before your next appointment, please call your pharmacy*  Lab Work: [place] 3 MONTHS FOR REPEAT LIPID/CMP LABS If you have labs (blood work) drawn today and your tests are completely normal, you will receive your results only by: [place] (if you have MyChart) OR A paper copy in the mail If you have any lab test that is abnormal or we need to change your treatment, we will call you to review the results.  Follow-Up: At [facility], you and your health needs are our priority.  As part of our continuing mission to provide you with exceptional heart care, we have created designated Provider Care Teams.  These Care Teams include your primary Cardiologist (physician) and Advanced Practice Providers (APPs -  Physician Assistants and Nurse Practitioners) who all work together to provide you with the care you need, when you need it.  Your next appointment:   October 12th at 8:00AM  The format for your next appointment:   In Person  Provider:   Cherlynn Kaiser, MD

## 2021-04-27 NOTE — Progress Notes (Signed)
Chief Complaint:   OBESITY Molly Bean is here to discuss her progress with her obesity treatment plan along with follow-up of her obesity related diagnoses.   Today's visit was #: 39 Starting weight: 240 lbs Starting date: 04/15/2020 Today's weight: 240 lbs Today's date: 04/13/2021 Weight change since last visit: 0 Total lbs lost to date: 0 Body mass index is 43.9 kg/m.   Interim History:  Molly Bean has increased to 5 bottles of water per day and has been walking 1 mile daily consistently now.  She feels good, sleeping better.  Loves her meal plan.  Current Meal Plan: following a lower carbohydrate, vegetable and lean protein rich diet plan for 90% of the time.  Current Exercise Plan: Walking for 1 mile daily. Current Anti-Obesity Medications: Ozempic 0.25 mg subcutaneously weekly. Side effects: None.  Assessment/Plan:   Medications Discontinued During This Encounter  Medication Reason   buPROPion (WELLBUTRIN SR) 150 MG 12 hr tablet Reorder   Vitamin D, Ergocalciferol, (DRISDOL) 1.25 MG (50000 UNIT) CAPS capsule Reorder   metFORMIN (GLUCOPHAGE) 500 MG tablet Reorder   Semaglutide,0.25 or 0.'5MG'$ /DOS, (OZEMPIC, 0.25 OR 0.5 MG/DOSE,) 2 MG/1.5ML SOPN Reorder   Meds ordered this encounter  Medications   buPROPion (WELLBUTRIN SR) 150 MG 12 hr tablet    Sig: Take 1 tablet (150 mg total) by mouth in the morning.    Dispense:  30 tablet    Refill:  0    Ov for rf   metFORMIN (GLUCOPHAGE) 500 MG tablet    Sig: Take 1 tablet (500 mg total) by mouth daily with breakfast.    Dispense:  30 tablet    Refill:  0    Ov for rf   Semaglutide,0.25 or 0.'5MG'$ /DOS, (OZEMPIC, 0.25 OR 0.5 MG/DOSE,) 2 MG/1.5ML SOPN    Sig: Inject 0.5 mg into the skin once a week. Ov for rf    Dispense:  1.5 mL    Refill:  0   Vitamin D, Ergocalciferol, (DRISDOL) 1.25 MG (50000 UNIT) CAPS capsule    Sig: 1 po q wed, and 1 po q sun    Dispense:  8 capsule    Refill:  0    Ov for rf    1. Prediabetes Not at  goal. Goal is HgbA1c < 5.7.  Medication: metformin 500 mg daily and Ozempic 0.25 mg subcutaneously weekly.    Plan:  Discussed labs with patient today.  She will continue to focus on protein-rich, low simple carbohydrate foods. We reviewed the importance of hydration, regular exercise for stress reduction, and restorative sleep.  Labs improving.  Refill Ozempic, but she will increase dose from 0.25 mg weekly to 0.5 mg weekly.  Refill metformin at same dose.  Lab Results  Component Value Date   HGBA1C 6.0 (H) 03/23/2021   Lab Results  Component Value Date   INSULIN 21.1 03/23/2021   INSULIN 21.9 04/15/2020   - Refill metFORMIN (GLUCOPHAGE) 500 MG tablet; Take 1 tablet (500 mg total) by mouth daily with breakfast.  Dispense: 30 tablet; Refill: 0 - Refill Semaglutide,0.25 or 0.'5MG'$ /DOS, (OZEMPIC, 0.25 OR 0.5 MG/DOSE,) 2 MG/1.5ML SOPN; Inject 0.5 mg into the skin once a week. Ov for rf  Dispense: 1.5 mL; Refill: 0  2. Vitamin D deficiency Improving, but not optimized.  She is taking vitamin D 50,000 IU twice weekly.  Plan:  Discussed labs with patient today.  Continue to take prescription Vitamin D '@50'$ ,000 IU twice weekly as prescribed.  Follow-up for routine testing of  Vitamin D, at least 2-3 times per year to avoid over-replacement.  Lab Results  Component Value Date   VD25OH 46.9 12/30/2020   VD25OH 24.3 (L) 08/19/2020   VD25OH 21.9 (L) 04/15/2020   - Refill Vitamin D, Ergocalciferol, (DRISDOL) 1.25 MG (50000 UNIT) CAPS capsule; 1 po q wed, and 1 po q sun  Dispense: 8 capsule; Refill: 0  3. Anxiety, with emotional eating At goal. Medication: Wellbutrin 150 mg daily.  Feels great emotionally and physically.    Plan: Behavior modification techniques were discussed today to help deal with emotional/non-hunger eating behaviors.  Refill Wellbutrin.   - Refill buPROPion (WELLBUTRIN SR) 150 MG 12 hr tablet; Take 1 tablet (150 mg total) by mouth in the morning.  Dispense: 30 tablet; Refill:  0  4. At risk for dehydration Molly Bean is at higher than average risk of dehydration.  Molly Bean was given more than 9 minutes of proper hydration counseling today.  We discussed the signs and symptoms of dehydration, some of which may include muscle cramping, constipation or even orthostatic symptoms.  Counseling on the prevention of dehydration was also provided today.  Molly Bean is at risk for dehydration due to weight loss, lifestyle and behavorial habits and possibly due to taking certain medication(s).  She was encouraged to adequately hydrate and monitor fluid status to avoid dehydration as well as weight loss plateaus.  Unless pre-existing renal or cardiopulmonary conditions exist, in which patient was told to limit their fluid intake, I recommended roughly one half of their weight in pounds to be the approximate ounces of non-caloric, non-caffeinated beverages they should drink per day; including more if they are engaging in exercise.  5. Class 3 severe obesity with serious comorbidity and body mass index (BMI) of 40.0 to 44.9 in adult, unspecified obesity type [name])  Course: Molly Bean is currently in the action stage of change. As such, her goal is to continue with weight loss efforts.   Nutrition goals: She has agreed to following a lower carbohydrate, vegetable and lean protein rich diet plan.   Exercise goals:  As is.  Behavioral modification strategies: increasing water intake, keeping healthy foods in the home, and planning for success.  Molly Bean has agreed to follow-up with our clinic in 2-3 weeks. She was informed of the importance of frequent follow-up visits to maximize her success with intensive lifestyle modifications for her multiple health conditions.   Objective:   Blood pressure 121/80, pulse 74, temperature 98.1 F (36.7 C), height '5\' 2"'$  (1.575 m), weight 240 lb (108.9 kg), SpO2 98 %. Body mass index is 43.9 kg/m.  General: Cooperative, alert, well developed, in no  acute distress. HEENT: Conjunctivae and lids unremarkable. Cardiovascular: Regular rhythm.  Lungs: Normal work of breathing. Neurologic: No focal deficits.   Lab Results  Component Value Date   CREATININE 0.81 03/23/2021   BUN 13 03/23/2021   NA 141 03/23/2021   K 4.3 03/23/2021   CL 104 03/23/2021   CO2 25 03/23/2021   Lab Results  Component Value Date   ALT 14 04/15/2020   AST 14 04/15/2020   ALKPHOS 83 04/15/2020   BILITOT 0.3 04/15/2020   Lab Results  Component Value Date   HGBA1C 6.0 (H) 03/23/2021   HGBA1C 6.2 (H) 12/30/2020   HGBA1C 6.1 (H) 08/19/2020   HGBA1C 6.1 (H) 04/15/2020   Lab Results  Component Value Date   INSULIN 21.1 03/23/2021   INSULIN 21.9 04/15/2020   Lab Results  Component Value Date   TSH 1.100  04/15/2020   Lab Results  Component Value Date   CHOL 174 12/30/2020   HDL 65 12/30/2020   LDLCALC 97 12/30/2020   LDLDIRECT 95 12/30/2020   TRIG 62 12/30/2020   CHOLHDL 2.7 12/30/2020   Lab Results  Component Value Date   VD25OH 46.9 12/30/2020   VD25OH 24.3 (L) 08/19/2020   VD25OH 21.9 (L) 04/15/2020   Lab Results  Component Value Date   WBC 9.2 12/30/2020   HGB 12.6 12/30/2020   HCT 40.6 12/30/2020   MCV 69 (L) 12/30/2020   PLT 220 12/30/2020   Lab Results  Component Value Date   IRON 51 12/30/2020   TIBC 300 12/30/2020   FERRITIN 55 12/30/2020   Attestation Statements:   Reviewed by clinician on day of visit: allergies, medications, problem list, medical history, surgical history, family history, social history, and previous encounter notes.  I, Water quality scientist, CMA, am acting as Location manager for [employer], DO.  I have reviewed the above documentation for accuracy and completeness, and I agree with the above. Marjory Sneddon, D.O.  The [place] was signed into law in 2016 which includes the topic of electronic health records.  This provides immediate access to information in MyChart.  This includes  consultation notes, operative notes, office notes, lab results and pathology reports.  If you have any questions about what you read please let us know at your next visit so we can discuss your concerns and take corrective action if need be.  We are right here with you.

## 2021-04-27 NOTE — Progress Notes (Signed)
Cardiology Office Note:    Date:  04/27/21  ID:  Molly Bean, DOB 02-25-1961, MRN [id number]  PCP:  Janith Lima, MD  Cardiologist:  Elouise Munroe, MD  Electrophysiologist:  None   Referring MD: Marrian Salvage,*   Chief Complaint/Reason for Referral: DOE  History of Present Illness:    Molly Bean is a 60 y.o. female with a history of anxiety and GERD, who presents for follow-up of shortness of breath.   For she is doing well overall and anticipates retiring from her job of 27-1/2 years on September 1.  She has mixed feelings about retirement but is looking forward to getting some of her time back.  Shortness of breath has improved with treatment of OSA.  She continues to follow [facility] for [facility] and has recently started Ozempic with good weight loss results and suppression of appetite which she is in favor of.  She stopped amlodipine due to concerns over polypharmacy, fortunately she has had serial normal blood pressures at her [facility] visits and remains on triamterene HCTZ for blood pressure therapy.  Not currently on a statin we discussed most recent lipid panel from June.  The patient denies chest pain, chest pressure, dyspnea at rest or with exertion, palpitations, PND, orthopnea, or leg swelling. Denies cough, fever, chills. Denies nausea, vomiting. Denies syncope or presyncope. Denies dizziness or lightheadedness. Denies snoring.   Past Medical History:  Diagnosis Date   Anxiety    Constipation    DEPRESSION    edema lower extremeties    GERD    Joint pain    MENOPAUSAL SYNDROME    Multiple food allergies    OBESITY    Shortness of breath    Sleep apnea    wears CPAP    Past Surgical History:  Procedure Laterality Date   ABDOMINAL HYSTERECTOMY  11/1998   total fibroids   CESAREAN SECTION      Current Medications: Current Meds  Medication Sig   acetaminophen (TYLENOL) 500  MG tablet Take 1,000 mg by mouth every 6 (six) hours as needed for mild pain or headache.   albuterol (VENTOLIN HFA) 108 (90 Base) MCG/ACT inhaler TAKE 2 PUFFS BY MOUTH EVERY 6 HOURS AS NEEDED FOR WHEEZE OR SHORTNESS OF BREATH   buPROPion (WELLBUTRIN SR) 150 MG 12 hr tablet Take 1 tablet (150 mg total) by mouth in the morning.   doxycycline (VIBRAMYCIN) 100 MG capsule doxycycline hyclate 100 mg capsule  TAKE 1 CAPSULE BY MOUTH TWICE A DAY FOR 7 DAYS   Ferrous Sulfate (IRON) 325 (65 Fe) MG TABS Take 1 tablet by mouth daily at 2 PM.   linaclotide (LINZESS) 145 MCG CAPS capsule Take 1 capsule (145 mcg total) by mouth daily before breakfast.   metFORMIN (GLUCOPHAGE) 500 MG tablet Take 1 tablet (500 mg total) by mouth daily with breakfast.   rosuvastatin (CRESTOR) 5 MG tablet Take 1 tablet (5 mg total) by mouth daily.   Semaglutide,0.25 or 0.'5MG'$ /DOS, (OZEMPIC, 0.25 OR 0.5 MG/DOSE,) 2 MG/1.5ML SOPN Inject 0.5 mg into the skin once a week. Ov for rf   triamterene-hydrochlorothiazide (MAXZIDE-25) 37.5-25 MG tablet TAKE 0.5-1 TABLET BY MOUTH DAILY.   Vitamin D, Ergocalciferol, (DRISDOL) 1.25 MG (50000 UNIT) CAPS capsule 1 po q wed, and 1 po q sun     Allergies:   Pennsaid [diclofenac sodium], Oxycodone, and Shellfish allergy   Social History   Tobacco Use   Smoking status:  Former    Packs/day: 0.30    Years: 20.00    Pack years: 6.00    Types: Cigarettes    Quit date: 10/04/1977    Years since quitting: 43.5   Smokeless tobacco: Former    Quit date: 10/05/1987  Vaping Use   Vaping Use: Never used  Substance Use Topics   Alcohol use: No   Drug use: No     Family History: The patient's family history includes Atrial fibrillation in her mother; Breast cancer (age of onset: 35) in her sister; Coronary artery disease in her mother; Diabetes in her mother; Hypertension in her mother; Lupus in her cousin; Obesity in her mother; Seizures in her father; Sleep apnea in her mother; Stroke in her  father.  ROS:   Please see the history of present illness.    All other systems reviewed and are negative.  EKGs/Labs/Other Studies Reviewed:    The following studies were reviewed today:  EKG: Sinus rhythm heart rate 64, nonspecific T wave abnormality  I have independently reviewed the images from a CT angio chest pulmonary embolism study 11/14/2019, no definite coronary artery calcifications are seen.  Recent Labs: 12/30/2020: Hemoglobin 12.6; Platelets 220 03/23/2021: BUN 13; Creatinine, Ser 0.81; Potassium 4.3; Sodium 141  Recent Lipid Panel    Component Value Date/Time   CHOL 174 12/30/2020 1641   TRIG 62 12/30/2020 1641   HDL 65 12/30/2020 1641   CHOLHDL 2.7 12/30/2020 1641   CHOLHDL 3 11/06/2010 1638   VLDL 20.8 11/06/2010 1638   LDLCALC 97 12/30/2020 1641   LDLDIRECT 95 12/30/2020 1641    Physical Exam:    VS:  BP 130/82 (BP Location: Left Arm, Patient Position: Sitting, Cuff Size: Large)   Pulse 64   Ht '5\' 2"'$  (1.575 m)   Wt 237 lb 9.6 oz (107.8 kg)   SpO2 98%   BMI 43.46 kg/m     Wt Readings from Last 5 Encounters:  04/27/21 237 lb 9.6 oz (107.8 kg)  04/13/21 240 lb (108.9 kg)  03/16/21 240 lb (108.9 kg)  02/04/21 241 lb (109.3 kg)  01/27/21 243 lb (110.2 kg)    Constitutional: No acute distress Eyes: sclera non-icteric, normal conjunctiva and lids ENMT: normal dentition, moist mucous membranes Cardiovascular: regular rhythm, normal rate, no murmurs. S1 and S2 normal. Radial pulses normal bilaterally. No jugular venous distention.  Respiratory: clear to auscultation bilaterally GI : normal bowel sounds, soft and nontender. No distention.   MSK: extremities warm, well perfused. No edema.  NEURO: grossly nonfocal exam, moves all extremities. PSYCH: alert and oriented x 3, normal mood and affect.    ASSESSMENT:    1. Essential hypertension   2. SOB (shortness of breath)   3. Class 3 severe obesity due to excess calories with serious comorbidity and  body mass index (BMI) of 40.0 to 44.9 in adult ([place])   4. Snoring   5. OSA (obstructive sleep apnea)   6. Medication management   7. Pure hypercholesterolemia     PLAN:    Essential hypertension - Plan: EKG 12-Lead -She stopped amlodipine due to concerns of polypharmacy and medication interactions but had no adverse side effects.  Blood pressure has been normal on triamterene HCTZ.  If blood pressure goes above goal again would resume amlodipine at 5 mg daily.  Blood pressure may continue to improve with weight loss attempts.  SOB (shortness of breath) -She has had normal ischemic testing with a stress echo in 2019, no worrisome findings  on a cardiac monitor with rare PVCs and PACs, recently normal echocardiogram, and improvement in shortness of breath with treatment of sleep apnea.  Shortness of breath has improved with treatment of OSA.  Class 3 severe obesity due to excess calories with serious comorbidity and body mass index (BMI) of 40.0 to 44.9 in adult [facility]) -Following with [facility] wellness center.  She has started Ozempic with good results.  No increase in palpitations endorsed, would avoid stimulant medications given history of palpitations.  Snoring -Uses CPAP, has significant benefit.  Continue.  Hyperlipidemia- LDL is above goal at 97 and we have discussed the recent lipid guidelines which suggest LDL goal should be less than 70 for primary prevention.  We participated in shared decision making regarding continued lifestyle modification and recheck of lipids versus starting low-dose statin therapy at this time given her history of hypertension and prediabetes.  She would prefer to start low-dose statin therapy today.  We will initiate rosuvastatin 5 mg daily with lipids and CMP in 3 months and a follow-up visit afterwards.  I have described for her concerning side effects and when to stop the medication and call our office.  Total time of encounter: 30 minutes total  time of encounter, including 25 minutes spent in face-to-face patient care on the date of this encounter. This time includes coordination of care and counseling regarding above mentioned problem list. Remainder of non-face-to-face time involved reviewing chart documents/testing relevant to the patient encounter and documentation in the medical record. I have independently reviewed documentation from referring provider.   Cherlynn Kaiser, MD, [facility]     Medication Adjustments/Labs and Tests Ordered: Current medicines are reviewed at length with the patient today.  Concerns regarding medicines are outlined above.   Orders Placed This Encounter  Procedures   Lipid panel   Comprehensive metabolic panel   EKG [id number]     Meds ordered this encounter  Medications   rosuvastatin (CRESTOR) 5 MG tablet    Sig: Take 1 tablet (5 mg total) by mouth daily.    Dispense:  90 tablet    Refill:  3     Patient Instructions  Medication Instructions:  START: CRESTOR (ROSUVASTATIN) '5mg'$  DAILY  *If you need a refill on your cardiac medications before your next appointment, please call your pharmacy*  Lab Work: [place] 3 MONTHS FOR REPEAT LIPID/CMP LABS If you have labs (blood work) drawn today and your tests are completely normal, you will receive your results only by: [place] (if you have MyChart) OR A paper copy in the mail If you have any lab test that is abnormal or we need to change your treatment, we will call you to review the results.  Follow-Up: At [facility], you and your health needs are our priority.  As part of our continuing mission to provide you with exceptional heart care, we have created designated Provider Care Teams.  These Care Teams include your primary Cardiologist (physician) and Advanced Practice Providers (APPs -  Physician Assistants and Nurse Practitioners) who all work together to provide you with the care you need, when you  need it.  Your next appointment:   October 12th at 8:00AM  The format for your next appointment:   In Person  Provider:   Cherlynn Kaiser, MD

## 2021-04-30 ENCOUNTER — Ambulatory Visit: Payer: BC Managed Care – PPO | Admitting: Physical Therapy

## 2021-04-30 ENCOUNTER — Ambulatory Visit (INDEPENDENT_AMBULATORY_CARE_PROVIDER_SITE_OTHER): Payer: BC Managed Care – PPO | Admitting: Family Medicine

## 2021-05-01 LAB — HM MAMMOGRAPHY

## 2021-05-08 ENCOUNTER — Other Ambulatory Visit (INDEPENDENT_AMBULATORY_CARE_PROVIDER_SITE_OTHER): Payer: Self-pay | Admitting: Family Medicine

## 2021-05-08 DIAGNOSIS — E559 Vitamin D deficiency, unspecified: Secondary | ICD-10-CM

## 2021-05-08 DIAGNOSIS — R7303 Prediabetes: Secondary | ICD-10-CM

## 2021-05-10 ENCOUNTER — Other Ambulatory Visit (INDEPENDENT_AMBULATORY_CARE_PROVIDER_SITE_OTHER): Payer: Self-pay | Admitting: Family Medicine

## 2021-05-10 DIAGNOSIS — R7303 Prediabetes: Secondary | ICD-10-CM

## 2021-05-10 DIAGNOSIS — F419 Anxiety disorder, unspecified: Secondary | ICD-10-CM

## 2021-05-11 ENCOUNTER — Encounter (INDEPENDENT_AMBULATORY_CARE_PROVIDER_SITE_OTHER): Payer: Self-pay | Admitting: Emergency Medicine

## 2021-05-11 NOTE — Telephone Encounter (Signed)
Msg sent Via Mychart

## 2021-05-11 NOTE — Telephone Encounter (Signed)
Last OV with Dr Opalski 

## 2021-05-11 NOTE — Telephone Encounter (Signed)
Dr.Opalski ?

## 2021-05-11 NOTE — Telephone Encounter (Signed)
Mychart msg has been sent.

## 2021-05-11 NOTE — Telephone Encounter (Signed)
Mychart msg has been sent to patient

## 2021-05-12 NOTE — Telephone Encounter (Signed)
Patient states she did not need a refill on her meds at this time

## 2021-05-20 ENCOUNTER — Other Ambulatory Visit: Payer: Self-pay

## 2021-05-20 ENCOUNTER — Encounter (INDEPENDENT_AMBULATORY_CARE_PROVIDER_SITE_OTHER): Payer: Self-pay | Admitting: Family Medicine

## 2021-05-20 ENCOUNTER — Ambulatory Visit (INDEPENDENT_AMBULATORY_CARE_PROVIDER_SITE_OTHER): Payer: BC Managed Care – PPO | Admitting: Family Medicine

## 2021-05-20 VITALS — BP 121/76 | HR 62 | Temp 98.2°F | Ht 62.0 in | Wt 231.0 lb

## 2021-05-20 DIAGNOSIS — E559 Vitamin D deficiency, unspecified: Secondary | ICD-10-CM | POA: Diagnosis not present

## 2021-05-20 DIAGNOSIS — R7303 Prediabetes: Secondary | ICD-10-CM

## 2021-05-20 DIAGNOSIS — F411 Generalized anxiety disorder: Secondary | ICD-10-CM

## 2021-05-20 DIAGNOSIS — J4520 Mild intermittent asthma, uncomplicated: Secondary | ICD-10-CM

## 2021-05-20 DIAGNOSIS — E7849 Other hyperlipidemia: Secondary | ICD-10-CM | POA: Diagnosis not present

## 2021-05-20 DIAGNOSIS — K5909 Other constipation: Secondary | ICD-10-CM

## 2021-05-20 DIAGNOSIS — Z9189 Other specified personal risk factors, not elsewhere classified: Secondary | ICD-10-CM | POA: Diagnosis not present

## 2021-05-20 DIAGNOSIS — Z6841 Body Mass Index (BMI) 40.0 and over, adult: Secondary | ICD-10-CM

## 2021-05-20 DIAGNOSIS — F419 Anxiety disorder, unspecified: Secondary | ICD-10-CM

## 2021-05-20 DIAGNOSIS — J45909 Unspecified asthma, uncomplicated: Secondary | ICD-10-CM | POA: Diagnosis not present

## 2021-05-20 MED ORDER — OZEMPIC (0.25 OR 0.5 MG/DOSE) 2 MG/1.5ML ~~LOC~~ SOPN: 0.5000 mg | PEN_INJECTOR | SUBCUTANEOUS | 0 refills | Status: DC

## 2021-05-20 MED ORDER — VITAMIN D (ERGOCALCIFEROL) 1.25 MG (50000 UNIT) PO CAPS: ORAL_CAPSULE | ORAL | 0 refills | Status: DC

## 2021-05-20 MED ORDER — METFORMIN HCL 500 MG PO TABS: 500.0000 mg | ORAL_TABLET | Freq: Every day | ORAL | 0 refills | Status: DC

## 2021-05-20 MED ORDER — LINACLOTIDE 145 MCG PO CAPS: 145.0000 ug | ORAL_CAPSULE | Freq: Every day | ORAL | 0 refills | Status: DC

## 2021-05-20 MED ORDER — ALBUTEROL SULFATE HFA 108 (90 BASE) MCG/ACT IN AERS: INHALATION_SPRAY | RESPIRATORY_TRACT | 0 refills | Status: DC

## 2021-05-20 MED ORDER — BUPROPION HCL ER (SR) 150 MG PO TB12: 150.0000 mg | ORAL_TABLET | Freq: Every morning | ORAL | 0 refills | Status: DC

## 2021-05-21 LAB — VITAMIN D 25 HYDROXY (VIT D DEFICIENCY, FRACTURES): Vit D, 25-Hydroxy: 47.7 ng/mL (ref 30.0–100.0)

## 2021-05-21 NOTE — Progress Notes (Signed)
Chief Complaint:   OBESITY Molly Bean is here to discuss her progress with her obesity treatment plan along with follow-up of her obesity related diagnoses. Molly Bean is on following a lower carbohydrate, vegetable and lean protein rich diet plan and states she is following her eating plan approximately 90% of the time. Molly Bean states she is walking for 15 minutes 6 times per week.  Today's visit was #: 18 Starting weight: 231 lbs Starting date: 04/15/2020 Today's weight: 240 lbs Today's date: 05/20/2021 Total lbs lost to date: 0 Total lbs lost since last in-office visit: 9  Interim History: Molly Bean started walking 15-30 minutes for 6 days per week. She started really following her meal plan and drinking 6 bottles of water daily.  Subjective:   1. Pre-diabetes Molly Bean notes she can't do sweets or bread anymore. She feels her carbohydrate cravings are well controlled. Last A1c was 6.0 approximately 1.5 months ago. Her hunger is controlled. She is on Ozempic and metformin   2. Other hyperlipidemia Molly Bean's Cardiologist put her on cholesterol medications on 04/27/2021. Then requested her to get a repeat CMP and FLP in 2 months from starting her medications. She is tolerating her medications well with no side effects.  3. Other constipation Molly Bean requests a refill of Linzess. She notes it is working "great" and her bowels are finally normal with taking the medicine.  4. Asthma, unspecified asthma severity, unspecified whether complicated, unspecified whether persistent Molly Bean requests a refill of albuterol. She hasn't used it in 5-6 months, but she just wants a back up.  5. Vitamin D deficiency Molly Bean will return September 7th, and we can obtain labs at that time.  6. GAD (generalized anxiety disorder) with emotional eating Molly Bean's mood is stable, and her emotional eating symptoms are well controlled on her medications. She denies side effects and she is tolerating it  well.  7. At risk for heart disease Molly Bean is at a higher than average risk for cardiovascular disease due to hypertension, pre-diabetes, and poorly controlled hyperlipidemia.   Assessment/Plan:   Orders Placed This Encounter  Procedures   VITAMIN D 25 Hydroxy (Vit-D Deficiency, Fractures)    Medications Discontinued During This Encounter  Medication Reason   linaclotide (LINZESS) 145 MCG CAPS capsule Reorder   buPROPion (WELLBUTRIN SR) 150 MG 12 hr tablet Reorder   metFORMIN (GLUCOPHAGE) 500 MG tablet Reorder   Semaglutide,0.25 or 0.'5MG'$ /DOS, (OZEMPIC, 0.25 OR 0.5 MG/DOSE,) 2 MG/1.5ML SOPN Reorder   Vitamin D, Ergocalciferol, (DRISDOL) 1.25 MG (50000 UNIT) CAPS capsule Reorder   albuterol (VENTOLIN HFA) 108 (90 Base) MCG/ACT inhaler Reorder     Meds ordered this encounter  Medications   DISCONTD: buPROPion (WELLBUTRIN SR) 150 MG 12 hr tablet    Sig: Take 1 tablet (150 mg total) by mouth in the morning.    Dispense:  30 tablet    Refill:  0    Ov for rf   DISCONTD: linaclotide (LINZESS) 145 MCG CAPS capsule    Sig: Take 1 capsule (145 mcg total) by mouth daily before breakfast.    Dispense:  30 capsule    Refill:  0   DISCONTD: metFORMIN (GLUCOPHAGE) 500 MG tablet    Sig: Take 1 tablet (500 mg total) by mouth daily with breakfast.    Dispense:  30 tablet    Refill:  0    Ov for rf   DISCONTD: Semaglutide,0.25 or 0.'5MG'$ /DOS, (OZEMPIC, 0.25 OR 0.5 MG/DOSE,) 2 MG/1.5ML SOPN    Sig: Inject 0.5 mg into  the skin once a week. Ov for rf    Dispense:  1.5 mL    Refill:  0   DISCONTD: Vitamin D, Ergocalciferol, (DRISDOL) 1.25 MG (50000 UNIT) CAPS capsule    Sig: 1 po q wed, and 1 po q sun    Dispense:  8 capsule    Refill:  0    Ov for rf   albuterol (VENTOLIN HFA) 108 (90 Base) MCG/ACT inhaler    Sig: TAKE 2 PUFFS BY MOUTH EVERY 6 HOURS AS NEEDED FOR WHEEZE OR SHORTNESS OF BREATH    Dispense:  8.5 g    Refill:  0     1. Pre-diabetes Molly Bean will continue to work on  weight loss, exercise, and decreasing simple carbohydrates to help decrease the risk of diabetes. We will refill Ozempic and metformin for 1 month.  - metFORMIN (GLUCOPHAGE) 500 MG tablet; Take 1 tablet (500 mg total) by mouth daily with breakfast.  Dispense: 30 tablet; Refill: 0 - Semaglutide,0.25 or 0.'5MG'$ /DOS, (OZEMPIC, 0.25 OR 0.5 MG/DOSE,) 2 MG/1.5ML SOPN; Inject 0.5 mg into the skin once a week. Ov for rf  Dispense: 1.5 mL; Refill: 0  2. Other hyperlipidemia Molly Bean will continue her prudent nutritional plan with decreased saturated and trans fats, increase exercise as tolerated. She will continue her medications management per specialists, and she will increase her water intake.  3. Other constipation Molly Bean will continue her medications, and will increase her water intake and activity. We will refill Linzess for 1 month. Orders and follow up as documented in patient record.   Counseling Getting to Good Bowel Health: Your goal is to have one soft bowel movement each day. Drink at least 8 glasses of water each day. Eat plenty of fiber (goal is over 25 grams each day). It is best to get most of your fiber from dietary sources which includes leafy green vegetables, fresh fruit, and whole grains. You may need to add fiber with the help of OTC fiber supplements. These include Metamucil, Citrucel, and Flaxseed. If you are still having trouble, try adding Miralax or Magnesium Citrate. If all of these changes do not work, Cabin crew.  - linaclotide (LINZESS) 145 MCG CAPS capsule; Take 1 capsule (145 mcg total) by mouth daily before breakfast.  Dispense: 30 capsule; Refill: 0  4. Asthma, unspecified asthma severity, unspecified whether complicated, unspecified whether persistent We will refill albuterol for 1 month. Counseling was done  today. Molly Bean's symptoms are stable.  - albuterol (VENTOLIN HFA) 108 (90 Base) MCG/ACT inhaler; TAKE 2 PUFFS BY MOUTH EVERY 6 HOURS AS NEEDED FOR  WHEEZE OR SHORTNESS OF BREATH  Dispense: 8.5 g; Refill: 0  5. Vitamin D deficiency Low Vitamin D level contributes to fatigue and are associated with obesity, breast, and colon cancer. We will refill prescription Vitamin D for 1 month. We will recheck Vit D level in the near future or within the upcoming labs in late September. Molly Bean will follow-up for routine testing of Vitamin D, at least 2-3 times per year to avoid over-replacement.  - Vitamin D, Ergocalciferol, (DRISDOL) 1.25 MG (50000 UNIT) CAPS capsule; 1 po q wed, and 1 po q sun  Dispense: 8 capsule; Refill: 0 - VITAMIN D 25 Hydroxy (Vit-D Deficiency, Fractures)  6. GAD (generalized anxiety disorder) with emotional eating Behavior modification techniques were discussed today to help Molly Bean deal with her emotional/non-hunger eating behaviors. We will refill Wellbutrin SR for 1 month. Orders and follow up as documented in patient record.   -  buPROPion (WELLBUTRIN SR) 150 MG 12 hr tablet; Take 1 tablet (150 mg total) by mouth in the morning.  Dispense: 30 tablet; Refill: 0  7. At risk for heart disease Due to Molly Bean's current state of health and medical condition(s), she is at a higher risk for heart disease.  This puts the patient at much greater risk to subsequently develop cardiopulmonary conditions that can significantly affect patient's quality of life in a negative manner.    At least 23 minutes were spent on counseling Molly Bean about these concerns today, and I stressed the importance of reversing risks factors of obesity, especially truncal and visceral fat, hypertension, hyperlipidemia, and pre-diabetes.  The initial goal is to lose at least 5-10% of starting weight to help reduce these risk factors.  Counseling:  Intensive lifestyle modifications were discussed with Molly Bean as the most appropriate first line of treatment.  she will continue to work on diet, exercise, and weight loss efforts.  We will continue to reassess these  conditions on a fairly regular basis in an attempt to decrease the patient's overall morbidity and mortality.  Evidence-based interventions for health behavior change were utilized today including the discussion of self monitoring techniques, problem-solving barriers, and SMART goal setting techniques.  Specifically, regarding patient's less desirable eating habits and patterns, we employed the technique of small changes when Molly Bean has not been able to fully commit to her prudent nutritional plan.  8. Obesity with current BMI of 42.2 Molly Bean is currently in the action stage of change. As such, her goal is to continue with weight loss efforts. She has agreed to the Category 1 Plan.   We will consider a recheck of IC in the near future, and Molly Bean is to come to her next office visit fasting 30 minutes prior.  Exercise goals: As is, increase as tolerated.  Behavioral modification strategies: increasing lean protein intake, decreasing simple carbohydrates, keeping healthy foods in the home, avoiding temptations, and planning for success.  Molly Bean has agreed to follow-up with our clinic in 3 to 4 weeks. She was informed of the importance of frequent follow-up visits to maximize her success with intensive lifestyle modifications for her multiple health conditions.   Objective:   Blood pressure 121/76, pulse 62, temperature 98.2 F (36.8 C), height '5\' 2"'$  (1.575 m), weight 231 lb (104.8 kg), SpO2 98 %. Body mass index is 42.25 kg/m.  General: Cooperative, alert, well developed, in no acute distress. HEENT: Conjunctivae and lids unremarkable. Cardiovascular: Regular rhythm.  Lungs: Normal work of breathing. Neurologic: No focal deficits.   Lab Results  Component Value Date   CREATININE 0.81 03/23/2021   BUN 13 03/23/2021   NA 141 03/23/2021   K 4.3 03/23/2021   CL 104 03/23/2021   CO2 25 03/23/2021   Lab Results  Component Value Date   ALT 14 04/15/2020   AST 14 04/15/2020    ALKPHOS 83 04/15/2020   BILITOT 0.3 04/15/2020   Lab Results  Component Value Date   HGBA1C 6.0 (H) 03/23/2021   HGBA1C 6.2 (H) 12/30/2020   HGBA1C 6.1 (H) 08/19/2020   HGBA1C 6.1 (H) 04/15/2020   Lab Results  Component Value Date   INSULIN 21.1 03/23/2021   INSULIN 21.9 04/15/2020   Lab Results  Component Value Date   TSH 1.100 04/15/2020   Lab Results  Component Value Date   CHOL 174 12/30/2020   HDL 65 12/30/2020   LDLCALC 97 12/30/2020   LDLDIRECT 95 12/30/2020   TRIG 62 12/30/2020  CHOLHDL 2.7 12/30/2020   Lab Results  Component Value Date   VD25OH 47.7 05/20/2021   VD25OH 46.9 12/30/2020   VD25OH 24.3 (L) 08/19/2020   Lab Results  Component Value Date   WBC 9.2 12/30/2020   HGB 12.6 12/30/2020   HCT 40.6 12/30/2020   MCV 69 (L) 12/30/2020   PLT 220 12/30/2020   Lab Results  Component Value Date   IRON 51 12/30/2020   TIBC 300 12/30/2020   FERRITIN 55 12/30/2020   Attestation Statements:   Reviewed by clinician on day of visit: allergies, medications, problem list, medical history, surgical history, family history, social history, and previous encounter notes.   Wilhemena Durie, am acting as transcriptionist for [employer], DO.  I have reviewed the above documentation for accuracy and completeness, and I agree with the above. Molly Bean, D.O.  The [place] was signed into law in 2016 which includes the topic of electronic health records.  This provides immediate access to information in MyChart.  This includes consultation notes, operative notes, office notes, lab results and pathology reports.  If you have any questions about what you read please let us know at your next visit so we can discuss your concerns and take corrective action if need be.  We are right here with you.

## 2021-05-29 ENCOUNTER — Other Ambulatory Visit: Payer: Self-pay

## 2021-05-29 ENCOUNTER — Encounter: Payer: Self-pay | Admitting: Obstetrics and Gynecology

## 2021-05-29 ENCOUNTER — Ambulatory Visit (INDEPENDENT_AMBULATORY_CARE_PROVIDER_SITE_OTHER): Payer: BC Managed Care – PPO | Admitting: Obstetrics and Gynecology

## 2021-05-29 VITALS — BP 113/75 | HR 67 | Ht 62.0 in | Wt 231.0 lb

## 2021-05-29 DIAGNOSIS — N3281 Overactive bladder: Secondary | ICD-10-CM

## 2021-05-29 MED ORDER — TROSPIUM CHLORIDE ER 60 MG PO CP24: 1.0000 | ORAL_CAPSULE | Freq: Every day | ORAL | 5 refills | Status: DC

## 2021-05-29 NOTE — Progress Notes (Signed)
[facility] Return Visit  SUBJECTIVE  History of Present Illness: Molly Bean is a 60 y.o. female seen in follow-up for mixed incontinence and levator spasm. Plan at last visit was for physical therapy and flexeril was started.   Urgency still present but has improved. Voids about 4 times per day and 3 times per night. Every time on the way to the bathroom, has some leakage. Denies leakage with cough and sneeze.   Past Medical History: Patient  has a past medical history of Anxiety, Constipation, DEPRESSION, edema lower extremeties, GERD, Joint pain, MENOPAUSAL SYNDROME, Multiple food allergies, OBESITY, Shortness of breath, and Sleep apnea.   Past Surgical History: She  has a past surgical history that includes Cesarean section and Abdominal hysterectomy (11/1998).   Medications: She has a current medication list which includes the following prescription(s): acetaminophen, albuterol, bupropion, iron, linaclotide, metformin, rosuvastatin, ozempic (0.25 or 0.5 mg/dose), triamterene-hydrochlorothiazide, trospium chloride, and vitamin d (ergocalciferol).   Allergies: Patient is allergic to pennsaid [diclofenac sodium], oxycodone, and shellfish allergy.   Social History: Patient  reports that she quit smoking about 43 years ago. Her smoking use included cigarettes. She has a 6.00 pack-year smoking history. She quit smokeless tobacco use about 33 years ago. She reports that she does not drink alcohol and does not use drugs.      OBJECTIVE     Physical Exam: Vitals:   05/29/21 1501  Weight: 231 lb (104.8 kg)  Height: '5\' 2"'$  (1.575 m)   Gen: No apparent distress, A&O x 3.  Detailed Urogynecologic Evaluation:  Deferred.    ASSESSMENT AND PLAN    Molly Bean is a 60 y.o. with:  1. Overactive bladder    - We reviewed starting a medication for her symptoms which can help with the urgency leakage and nocturia.  - Prescribed Trospium '60mg'$  ER daily.  - Continue with  physical therapy - She will follow up in 6 weeks.   Molly Folds, MD   Time spent: I spent 20 minutes dedicated to the care of this patient on the date of this encounter to include pre-visit review of records, face-to-face time with the patient and post visit documentation and ordering medication/ testing.

## 2021-06-02 ENCOUNTER — Ambulatory Visit: Admitting: Physical Therapy

## 2021-06-10 ENCOUNTER — Other Ambulatory Visit: Payer: Self-pay

## 2021-06-10 ENCOUNTER — Ambulatory Visit (INDEPENDENT_AMBULATORY_CARE_PROVIDER_SITE_OTHER): Payer: BC Managed Care – PPO | Admitting: Family Medicine

## 2021-06-10 ENCOUNTER — Encounter (INDEPENDENT_AMBULATORY_CARE_PROVIDER_SITE_OTHER): Payer: Self-pay | Admitting: Family Medicine

## 2021-06-10 VITALS — BP 131/87 | HR 65 | Temp 98.5°F | Ht 62.0 in | Wt 225.0 lb

## 2021-06-10 DIAGNOSIS — Z6841 Body Mass Index (BMI) 40.0 and over, adult: Secondary | ICD-10-CM

## 2021-06-10 DIAGNOSIS — F411 Generalized anxiety disorder: Secondary | ICD-10-CM

## 2021-06-10 DIAGNOSIS — E559 Vitamin D deficiency, unspecified: Secondary | ICD-10-CM

## 2021-06-10 DIAGNOSIS — R0602 Shortness of breath: Secondary | ICD-10-CM

## 2021-06-10 DIAGNOSIS — K5909 Other constipation: Secondary | ICD-10-CM

## 2021-06-10 DIAGNOSIS — Z9189 Other specified personal risk factors, not elsewhere classified: Secondary | ICD-10-CM

## 2021-06-10 DIAGNOSIS — R7303 Prediabetes: Secondary | ICD-10-CM

## 2021-06-10 MED ORDER — METFORMIN HCL 500 MG PO TABS: 500.0000 mg | ORAL_TABLET | Freq: Every day | ORAL | 0 refills | Status: DC

## 2021-06-10 MED ORDER — VITAMIN D (ERGOCALCIFEROL) 1.25 MG (50000 UNIT) PO CAPS: ORAL_CAPSULE | ORAL | 0 refills | Status: DC

## 2021-06-10 MED ORDER — LINACLOTIDE 145 MCG PO CAPS: 145.0000 ug | ORAL_CAPSULE | Freq: Every day | ORAL | 0 refills | Status: DC

## 2021-06-10 MED ORDER — SEMAGLUTIDE (1 MG/DOSE) 4 MG/3ML ~~LOC~~ SOPN: 1.0000 mg | PEN_INJECTOR | SUBCUTANEOUS | 0 refills | Status: DC

## 2021-06-10 MED ORDER — BUPROPION HCL ER (SR) 150 MG PO TB12: 150.0000 mg | ORAL_TABLET | Freq: Every morning | ORAL | 0 refills | Status: DC

## 2021-06-10 NOTE — Progress Notes (Signed)
Chief Complaint:   OBESITY Molly Bean is here to discuss her progress with her obesity treatment plan along with follow-up of her obesity related diagnoses. Molly Bean is on the Category 1 Plan and states she is following her eating plan approximately 90% of the time. Molly Bean states she is walking 25-30 minutes 6 times per week.  Today's visit was #: 64 Starting weight: 231 lbs Starting date: 04/15/2020 Today's weight: 225 lbs Today's date: 06/10/2021 Total lbs lost to date: 6 Total lbs lost since last in-office visit: 6  Interim History: RMR done today shows increase from prior. Molly Bean is here for a follow up office visit.  We reviewed her meal plan and questions were answered.  Patient's food recall appears to be accurate and consistent with what is on plan when she is following it.   When eating on plan, her hunger and cravings are well controlled.    Subjective:   1. SOB (shortness of breath) on exertion Molly Bean notes increasing shortness of breath with exercising and seems to be worsening over time with weight gain. She notes getting out of breath sooner with activity than she used to. This has gotten worse recently. Molly Bean denies shortness of breath at rest or orthopnea.  2. Pre-diabetes Not at goal. Goal is HgbA1c < 5.7.  Medication: Ozempic and Metformin. Pt is tolerating medications well.    Lab Results  Component Value Date   HGBA1C 6.0 (H) 03/23/2021   Lab Results  Component Value Date   INSULIN 21.1 03/23/2021   INSULIN 21.9 04/15/2020   3. GAD (generalized anxiety disorder) with emotional eating Stable and "doing wonderful" with Wellbutrin.  4. Vitamin D deficiency Discussed labs with patient today. Not at goal.   Lab Results  Component Value Date   VD25OH 47.7 05/20/2021   VD25OH 46.9 12/30/2020   VD25OH 24.3 (L) 08/19/2020   5. Other constipation Molly Bean is tolerating medication(s) well without side effects.  Medication compliance  is good and patient appears to be taking it as prescribed.  Denies additional concerns regarding this condition.   6. At risk for side effect of medication Molly Bean is at risk for medication side effects due to increasing Ozempic.  Assessment/Plan:  No orders of the defined types were placed in this encounter.   Medications Discontinued During This Encounter  Medication Reason   Semaglutide,0.25 or 0.'5MG'$ /DOS, (OZEMPIC, 0.25 OR 0.5 MG/DOSE,) 2 MG/1.5ML SOPN Not covered by the pt's insurance   buPROPion (WELLBUTRIN SR) 150 MG 12 hr tablet Reorder   linaclotide (LINZESS) 145 MCG CAPS capsule Reorder   metFORMIN (GLUCOPHAGE) 500 MG tablet Reorder   Vitamin D, Ergocalciferol, (DRISDOL) 1.25 MG (50000 UNIT) CAPS capsule Reorder     Meds ordered this encounter  Medications   buPROPion (WELLBUTRIN SR) 150 MG 12 hr tablet    Sig: Take 1 tablet (150 mg total) by mouth in the morning.    Dispense:  30 tablet    Refill:  0    Ov for rf   metFORMIN (GLUCOPHAGE) 500 MG tablet    Sig: Take 1 tablet (500 mg total) by mouth daily with breakfast.    Dispense:  30 tablet    Refill:  0    Ov for rf   Vitamin D, Ergocalciferol, (DRISDOL) 1.25 MG (50000 UNIT) CAPS capsule    Sig: 1 po q wed, and 1 po q sun    Dispense:  8 capsule    Refill:  0    Ov  for rf   linaclotide (LINZESS) 145 MCG CAPS capsule    Sig: Take 1 capsule (145 mcg total) by mouth daily before breakfast.    Dispense:  30 capsule    Refill:  0   Semaglutide, 1 MG/DOSE, 4 MG/3ML SOPN    Sig: Inject 1 mg as directed once a week.    Dispense:  3 mL    Refill:  0    One mo supply; ov for rf     1. SOB (shortness of breath) on exertion Molly Bean does feel that she gets out of breath more easily that she used to when she exercises. Molly Bean's shortness of breath appears to be obesity related and exercise induced. She has agreed to work on weight loss and gradually increase exercise to treat her exercise induced shortness of breath.  Will continue to monitor closely.Recheck IC today.  2. Pre-diabetes Plan:  She will continue to focus on protein-rich, low simple carbohydrate foods. We reviewed the importance of hydration, regular exercise for stress reduction, and restorative sleep.   Refill- metFORMIN (GLUCOPHAGE) 500 MG tablet; Take 1 tablet (500 mg total) by mouth daily with breakfast.  Dispense: 30 tablet; Refill: 0 - Semaglutide, 1 MG/DOSE, 4 MG/3ML SOPN; Inject 1 mg as directed once a week.  Dispense: 3 mL; Refill: 0  3. GAD (generalized anxiety disorder) with emotional eating Behavior modification techniques were discussed today to help Molly Bean deal with her anxiety.  Orders and follow up as documented in patient record.   Refill- buPROPion (WELLBUTRIN SR) 150 MG 12 hr tablet; Take 1 tablet (150 mg total) by mouth in the morning.  Dispense: 30 tablet; Refill: 0  4. Vitamin D deficiency Plan: Continue to take prescription Vitamin D 50,000 IU every week as prescribed.  Follow-up for routine testing of Vitamin D, at least 2-3 times per year to avoid over-replacement.  Refill- Vitamin D, Ergocalciferol, (DRISDOL) 1.25 MG (50000 UNIT) CAPS capsule; 1 po q wed, and 1 po q sun  Dispense: 8 capsule; Refill: 0  5. Other constipation Increase water intake to a minimum of 7 bottles per day and more on exercise days.  Refill- linaclotide (LINZESS) 145 MCG CAPS capsule; Take 1 capsule (145 mcg total) by mouth daily before breakfast.  Dispense: 30 capsule; Refill: 0  6. At risk for side effect of medication Due to Molly Bean's current conditions and medications, she is at a higher risk for drug side effect.  At least 23 minutes was spent on counseling her about these concerns today.  We discussed the benefits and potential risks of these medications, and all of patient's concerns were addressed and questions were answered.  she will call us, or their PCP or other specialists who treat their conditions with medications, with any  questions or concerns that may develop.    7. Obesity with current BMI of 41.2  Molly Bean is currently in the action stage of change. As such, her goal is to continue with weight loss efforts. She has agreed to the Category 2 Plan.   Handout: Recipes Meat prep discussed with pt.  Exercise goals:  As is  Behavioral modification strategies: increasing lean protein intake, decreasing simple carbohydrates, and meal planning and cooking strategies.  Molly Bean has agreed to follow-up with our clinic in 3-4 weeks. She was informed of the importance of frequent follow-up visits to maximize her success with intensive lifestyle modifications for her multiple health conditions.   Objective:   Blood pressure 131/87, pulse 65, temperature 98.5 F (36.9  C), height '5\' 2"'$  (1.575 m), weight 225 lb (102.1 kg), SpO2 98 %. Body mass index is 41.15 kg/m.  General: Cooperative, alert, well developed, in no acute distress. HEENT: Conjunctivae and lids unremarkable. Cardiovascular: Regular rhythm.  Lungs: Normal work of breathing. Neurologic: No focal deficits.   Lab Results  Component Value Date   CREATININE 0.81 03/23/2021   BUN 13 03/23/2021   NA 141 03/23/2021   K 4.3 03/23/2021   CL 104 03/23/2021   CO2 25 03/23/2021   Lab Results  Component Value Date   ALT 14 04/15/2020   AST 14 04/15/2020   ALKPHOS 83 04/15/2020   BILITOT 0.3 04/15/2020   Lab Results  Component Value Date   HGBA1C 6.0 (H) 03/23/2021   HGBA1C 6.2 (H) 12/30/2020   HGBA1C 6.1 (H) 08/19/2020   HGBA1C 6.1 (H) 04/15/2020   Lab Results  Component Value Date   INSULIN 21.1 03/23/2021   INSULIN 21.9 04/15/2020   Lab Results  Component Value Date   TSH 1.100 04/15/2020   Lab Results  Component Value Date   CHOL 174 12/30/2020   HDL 65 12/30/2020   LDLCALC 97 12/30/2020   LDLDIRECT 95 12/30/2020   TRIG 62 12/30/2020   CHOLHDL 2.7 12/30/2020   Lab Results  Component Value Date   VD25OH 47.7 05/20/2021    VD25OH 46.9 12/30/2020   VD25OH 24.3 (L) 08/19/2020   Lab Results  Component Value Date   WBC 9.2 12/30/2020   HGB 12.6 12/30/2020   HCT 40.6 12/30/2020   MCV 69 (L) 12/30/2020   PLT 220 12/30/2020   Lab Results  Component Value Date   IRON 51 12/30/2020   TIBC 300 12/30/2020   FERRITIN 55 12/30/2020    Attestation Statements:   Reviewed by clinician on day of visit: allergies, medications, problem list, medical history, surgical history, family history, social history, and previous encounter notes.  Coral Ceo, CMA, am acting as transcriptionist for [employer], DO.  I have reviewed the above documentation for accuracy and completeness, and I agree with the above. Marjory Sneddon, D.O.  The [facility] was signed into law in 2016 which includes the topic of electronic health records.  This provides immediate access to information in MyChart.  This includes consultation notes, operative notes, office notes, lab results and pathology reports.  If you have any questions about what you read please let us know at your next visit so we can discuss your concerns and take corrective action if need be.  We are right here with you.

## 2021-06-11 ENCOUNTER — Ambulatory Visit: Payer: BC Managed Care – PPO | Attending: Family | Admitting: Physical Therapy

## 2021-06-11 DIAGNOSIS — R278 Other lack of coordination: Secondary | ICD-10-CM | POA: Insufficient documentation

## 2021-06-11 DIAGNOSIS — R2689 Other abnormalities of gait and mobility: Secondary | ICD-10-CM | POA: Diagnosis present

## 2021-06-11 DIAGNOSIS — N393 Stress incontinence (female) (male): Secondary | ICD-10-CM | POA: Insufficient documentation

## 2021-06-11 NOTE — Patient Instructions (Signed)
Gentle head side to side, chin up down Shoulder shrugs/ elbow circles Pelvic tilts Heel slides Ankle pumps, swipe

## 2021-06-11 NOTE — Therapy (Signed)
[place] MAIN [facility] SERVICES [address] [place], [place], [address] Phone: [phone]   Fax:  [phone]  Physical Therapy Treatment  Patient Details  Name: Molly Bean MRN: [id number] Date of Birth: 1960/12/23 Referring Provider (PT): Wannetta Sender MD   Encounter Date: 06/11/2021   PT End of Session - 06/11/21 0910     Visit Number 9    Date for PT Re-Evaluation 06/18/21    PT Start Time 0903    PT Stop Time 1000    PT Time Calculation (min) 57 min    Activity Tolerance Patient tolerated treatment well    Behavior During Therapy [facility] for tasks assessed/performed             Past Medical History:  Diagnosis Date   Anxiety    Constipation    DEPRESSION    edema lower extremeties    GERD    Joint pain    MENOPAUSAL SYNDROME    Multiple food allergies    OBESITY    Shortness of breath    Sleep apnea    wears CPAP    Past Surgical History:  Procedure Laterality Date   ABDOMINAL HYSTERECTOMY  11/1998   total fibroids   CESAREAN SECTION      There were no vitals filed for this visit.   Subjective Assessment - 06/11/21 0908     Subjective Pt walks 6 days a week. Leakage is not as bad by 20%  compared to her first visit. Pt is on medication for OAB    Pertinent History C-section, abdominal hysterectomy.    Patient Stated Goals decreaase leakage, have sex and get through pelvic exams without pain,                [facility] PT Assessment - 06/11/21 1054       Palpation   SI assessment  tightness suprapubic area, restricted C section    Palpation comment coccyx not flexed                        Pelvic Floor Special Questions - 06/11/21 1054     Pelvic Floor Internal Exam pt consented verbally and had no contraindications    Exam Type Vaginal    Palpation tightness/ tenderness 7 clock by coccyx, iliococcygeus B, tenderness at ATLA and anterior mm, slightly lowered bladder behind pubic symphysis     Strength weak squeeze, no lift               [facility] Adult PT Treatment/Exercise - 06/11/21 1653       Neuro Re-ed    Neuro Re-ed Details  cued for anterior tilt of pelvic floor      Moist Heat Therapy   Number Minutes Moist Heat 5 Minutes    Moist Heat Location --   sacrum     Manual Therapy   Manual therapy comments C-section scar fascial mobility    Internal Pelvic Floor STM/MWM at problem areas noted in assessment                          PT Long Term Goals - 04/09/21 1754       PT LONG TERM GOAL #1   Title Pt will demo increased hip abduction strength from 3/5 to >4/5 in order to maintain walking routine and pelvic stability    Time 8    Period Weeks    Status On-going  PT LONG TERM GOAL #2   Title Pt will demo proper deepcore coordination and pelvic floor movement with no cues to progress to kegel exercises    Time 4    Period Weeks    Status Achieved      PT LONG TERM GOAL #3   Title Pt will demo decreased abdominal scar restrictions to advance to deep core exercises and promote IAP for continence and bowel movement elimination with less dependence on Linzess    Time 4    Period Weeks    Status Achieved      PT LONG TERM GOAL #4   Title Pt will increase FOTO score for constipation from 41 pts and Urinary 47 pts to > 55 pts in order to demo improved pelvic floor function    Time 10    Period Weeks    Status On-going      PT LONG TERM GOAL #5   Title Pt will demo decreased pelvic floor mm tightness and proper relaxation of mm to have less pain with intercourcse and gynecological exams    Time 10    Period Weeks    Status On-going                   Plan - 06/11/21 1656     Clinical Impression Statement Pt showed decreased posterior pelvic floor mm tensions post Tx today but will require more manual Tx to minimize anterior mm tensions at upcoming sessions. C-section scar mobility was improved post Tx but will also require more  manual Tx at next session to yield better anterior pelvic floor mobility and less lowered position of bladder. Pt continues to benefit from skilled PT    Examination-Activity Limitations Toileting;Continence;Other    Stability/Clinical Decision Making Evolving/Moderate complexity    Rehab Potential Good    PT Frequency 1x / week    PT Duration Other (comment)   10   PT Treatment/Interventions Neuromuscular re-education;Cryotherapy;ADLs/Self Care Home Management;Therapeutic activities;Functional mobility training;Traction;Moist Heat;Stair training;Gait training;Patient/family education;Taping;Manual techniques;Scar mobilization;Balance training;Therapeutic exercise;Spinal Manipulations;Joint Manipulations;Energy conservation    Consulted and Agree with Plan of Care Patient             Patient will benefit from skilled therapeutic intervention in order to improve the following deficits and impairments:  Decreased endurance, Decreased safety awareness, Difficulty walking, Decreased coordination, Abnormal gait, Improper body mechanics, Pain, Increased muscle spasms, Hypermobility, Hypomobility, Decreased scar mobility, Decreased mobility, Decreased strength, Postural dysfunction  Visit Diagnosis: Other lack of coordination  Stress incontinence of urine  Other abnormalities of gait and mobility     Problem List Patient Active Problem List   Diagnosis Date Noted   Proteus mirabilis infection 01/16/2021   Cutaneous abscess of right axilla 01/13/2021   Iron deficiency anemia 12/10/2020   At risk for side effect of medication 12/10/2020   At risk for diarrhea 10/27/2020   At risk for diabetes mellitus 10/14/2020   Other constipation 08/19/2020   At risk for dehydration 08/19/2020   Prediabetes 08/06/2020   Chronic constipation 08/06/2020   Other hyperlipidemia 08/06/2020   Vitamin D deficiency 08/06/2020   At risk for heart disease 08/06/2020   Dysuria 04/09/2020   Vaginitis  04/09/2020   Thumb pain, right 11/03/2018   Essential hypertension 05/01/2018   Cough 03/24/2018   SOB (shortness of breath) 03/24/2018   Asthma 03/14/2018   Hidradenitis suppurativa 01/18/2018   GAD (generalized anxiety disorder) 10/11/2016   Chronic shoulder pain 06/09/2016   MENOPAUSAL SYNDROME 04/23/2009  OBESITY 02/15/2008   Depression 04/12/2007   Gastroesophageal reflux disease 04/12/2007   Edema 04/12/2007    Jerl Mina, PT 06/11/2021, 4:57 PM  [place] MAIN [facility] SERVICES [address] [place], [place], [address] Phone: [phone]   Fax:  [phone]  Name: Molly Bean MRN: [id number] Date of Birth: Nov 08, 1960

## 2021-06-14 ENCOUNTER — Other Ambulatory Visit (INDEPENDENT_AMBULATORY_CARE_PROVIDER_SITE_OTHER): Payer: Self-pay | Admitting: Family Medicine

## 2021-06-14 DIAGNOSIS — E559 Vitamin D deficiency, unspecified: Secondary | ICD-10-CM

## 2021-06-14 DIAGNOSIS — R7303 Prediabetes: Secondary | ICD-10-CM

## 2021-06-15 ENCOUNTER — Other Ambulatory Visit: Payer: Self-pay

## 2021-06-15 ENCOUNTER — Ambulatory Visit: Payer: BC Managed Care – PPO | Admitting: Physical Therapy

## 2021-06-15 DIAGNOSIS — R278 Other lack of coordination: Secondary | ICD-10-CM | POA: Diagnosis not present

## 2021-06-15 DIAGNOSIS — N393 Stress incontinence (female) (male): Secondary | ICD-10-CM

## 2021-06-15 DIAGNOSIS — R2689 Other abnormalities of gait and mobility: Secondary | ICD-10-CM

## 2021-06-15 NOTE — Therapy (Signed)
[place] MAIN [facility] SERVICES [address] [place], [place], [address] Phone: [phone]   Fax:  [phone]  Physical Therapy Treatment /progress note reporting from 01/15/21 to present 06/15/21   Patient Details  Name: Molly Bean MRN: [id number] Date of Birth: Jan 11, 1961 Referring Provider (PT): Wannetta Sender MD   Encounter Date: 06/15/2021   PT End of Session - 06/15/21 0929     Visit Number 10    Date for PT Re-Evaluation 08/67/61   PN & recert 9/50/93   PT Start Time 0900    PT Stop Time 1000    PT Time Calculation (min) 60 min    Activity Tolerance Patient tolerated treatment well    Behavior During Therapy [facility] for tasks assessed/performed             Past Medical History:  Diagnosis Date   Anxiety    Constipation    DEPRESSION    edema lower extremeties    GERD    Joint pain    MENOPAUSAL SYNDROME    Multiple food allergies    OBESITY    Shortness of breath    Sleep apnea    wears CPAP    Past Surgical History:  Procedure Laterality Date   ABDOMINAL HYSTERECTOMY  11/1998   total fibroids   CESAREAN SECTION      There were no vitals filed for this visit.   Subjective Assessment - 06/15/21 0926     Subjective Pt reported shefelt sore after last session but took a bath and felt better. Today she has a headache but it is the first time she had a  HA for one month. Pt used to get HA often.    Pertinent History C-section, abdominal hysterectomy.    Patient Stated Goals decreaase leakage, have sex and get through pelvic exams without pain,                [facility] PT Assessment - 06/15/21 1018       Palpation   Palpation comment tightness at C/T junction, tightenss at L interspinals, B SCM/ upper trap, occiput attachments                           [facility] Adult PT Treatment/Exercise - 06/15/21 1017       Therapeutic Activites    Other Therapeutic Activities reassessed goals      Manual  Therapy   Manual therapy comments long axis distraction at occiput, STM/MWM at upper trap/ SCM B , interspinals L                          PT Long Term Goals - 06/15/21 0930       PT LONG TERM GOAL #1   Title Pt will demo increased hip abduction strength from 3/5 to >4/5 in order to maintain walking routine and pelvic stability    Time 8    Period Weeks    Status On-going      PT LONG TERM GOAL #2   Title Pt will demo proper deepcore coordination and pelvic floor movement with no cues to progress to kegel exercises    Time 4    Period Weeks    Status Achieved      PT LONG TERM GOAL #3   Title Pt will demo decreased abdominal scar restrictions to advance to deep core exercises and promote IAP for continence and bowel  movement elimination with less dependence on Linzess    Time 4    Period Weeks    Status Partially Met      PT LONG TERM GOAL #4   Title Pt will increase FOTO score for constipation from 41 pts and Urinary 47 pts to > 55 pts in order to demo improved pelvic floor function  ( 06/15/21: no change urinary, 2 pt change improvement  with bowel function )    Time 10    Period Weeks    Status On-going      PT LONG TERM GOAL #5   Title Pt will demo decreased pelvic floor mm tightness and proper relaxation of mm to have less pain with intercourcse and gynecological exams    Time 10    Period Weeks    Status Partially Met                   Plan - 06/15/21 0930     Clinical Impression Statement Pt has achieved 1 goal , partially met 2 goals, and progressing well towards remaining goals.   Pt reported 20% improvement with incontinence while FOTO score showed no change. Pt had slightly improved score with bowel function but pt still relies on laxatives.    Pt demo'd decreased posterior pelvic floor mm tightness and still will need manual Tx to minimize anterior mm to improve IAP system to better support pelvic organ and strength and coordination.  Coccyx alignment has been addressed. SIJ mobility has improved.     Pt's C-section scar restrictions will still need manual Tx to yield better deep core coordination and function. Pt shows improved posture , less forward head and less thoracic kyphosis and less hip/ SIJ tightness which will yield better outcomes for pelvic function. Pt showed slight relapse of tight neck muscles today. Pt demo'd decreased tightness post Tx. Pt was advised to be return to doing strengthening HEP for cervical retraction, scapular retraction to minimize forward head posture. Pt expressed she had not been doing these exercises as often lately. Pt voiced understanding. Pt will have better prognosis in the upcoming sessions as pt started her retirement and will no longer be sitting at a desk for long hours. Pt is compliant with walking routine and has started to lose weight. These lifestyle changes will help her outcomes. Pt continues to benefit from skilled PT.     Examination-Activity Limitations Toileting;Continence;Other    Stability/Clinical Decision Making Evolving/Moderate complexity    Rehab Potential Good    PT Frequency 1x / week    PT Duration Other (comment)   10   PT Treatment/Interventions Neuromuscular re-education;Cryotherapy;ADLs/Self Care Home Management;Therapeutic activities;Functional mobility training;Traction;Moist Heat;Stair training;Gait training;Patient/family education;Taping;Manual techniques;Scar mobilization;Balance training;Therapeutic exercise;Spinal Manipulations;Joint Manipulations;Energy conservation    Consulted and Agree with Plan of Care Patient             Patient will benefit from skilled therapeutic intervention in order to improve the following deficits and impairments:  Decreased endurance, Decreased safety awareness, Difficulty walking, Decreased coordination, Abnormal gait, Improper body mechanics, Pain, Increased muscle spasms, Hypermobility, Hypomobility, Decreased scar  mobility, Decreased mobility, Decreased strength, Postural dysfunction  Visit Diagnosis: Other lack of coordination  Stress incontinence of urine  Other abnormalities of gait and mobility     Problem List Patient Active Problem List   Diagnosis Date Noted   Proteus mirabilis infection 01/16/2021   Cutaneous abscess of right axilla 01/13/2021   Iron deficiency anemia 12/10/2020   At risk for side   effect of medication 12/10/2020   At risk for diarrhea 10/27/2020   At risk for diabetes mellitus 10/14/2020   Other constipation 08/19/2020   At risk for dehydration 08/19/2020   Prediabetes 08/06/2020   Chronic constipation 08/06/2020   Other hyperlipidemia 08/06/2020   Vitamin D deficiency 08/06/2020   At risk for heart disease 08/06/2020   Dysuria 04/09/2020   Vaginitis 04/09/2020   Thumb pain, right 11/03/2018   Essential hypertension 05/01/2018   Cough 03/24/2018   SOB (shortness of breath) 03/24/2018   Asthma 03/14/2018   Hidradenitis suppurativa 01/18/2018   GAD (generalized anxiety disorder) 10/11/2016   Chronic shoulder pain 06/09/2016   MENOPAUSAL SYNDROME 04/23/2009   OBESITY 02/15/2008   Depression 04/12/2007   Gastroesophageal reflux disease 04/12/2007   Edema 04/12/2007    Yeung,Shin Yiing, PT 06/15/2021, 10:20 AM  [facility] RE[facility] SERVICES [address] [place], [place], [address] Phone: [phone]   Fax:  [phone]  Name: Molly Bean MRN: [id number] Date of Birth: 09/17/1961    

## 2021-06-15 NOTE — Telephone Encounter (Signed)
Last seen Dr Raliegh Scarlet on 06/10/21

## 2021-06-16 ENCOUNTER — Other Ambulatory Visit (INDEPENDENT_AMBULATORY_CARE_PROVIDER_SITE_OTHER): Payer: Self-pay | Admitting: Family Medicine

## 2021-06-16 DIAGNOSIS — R7303 Prediabetes: Secondary | ICD-10-CM

## 2021-06-16 DIAGNOSIS — F411 Generalized anxiety disorder: Secondary | ICD-10-CM

## 2021-06-16 NOTE — Telephone Encounter (Signed)
Duplicate last refill at 06/10/21 ov

## 2021-06-18 ENCOUNTER — Encounter: Admitting: Physical Therapy

## 2021-06-22 ENCOUNTER — Ambulatory Visit: Payer: BC Managed Care – PPO | Admitting: Physical Therapy

## 2021-06-22 ENCOUNTER — Other Ambulatory Visit: Payer: Self-pay

## 2021-06-22 DIAGNOSIS — R2689 Other abnormalities of gait and mobility: Secondary | ICD-10-CM

## 2021-06-22 DIAGNOSIS — R278 Other lack of coordination: Secondary | ICD-10-CM

## 2021-06-22 DIAGNOSIS — N393 Stress incontinence (female) (male): Secondary | ICD-10-CM

## 2021-06-22 NOTE — Therapy (Signed)
[place] MAIN [facility] SERVICES [address] [place], [place], [address] Phone: [phone]   Fax:  [phone]  Physical Therapy Treatment  Patient Details  Name: Molly Bean MRN: [id number] Date of Birth: 1961-07-20 Referring Provider (PT): Wannetta Sender MD   Encounter Date: 06/22/2021   PT End of Session - 06/22/21 1120     Visit Number 11    Date for PT Re-Evaluation 78/67/67   PN & recert 11/12/45   PT Start Time 0904    PT Stop Time 1000    PT Time Calculation (min) 56 min    Activity Tolerance Patient tolerated treatment well    Behavior During Therapy [facility] for tasks assessed/performed             Past Medical History:  Diagnosis Date   Anxiety    Constipation    DEPRESSION    edema lower extremeties    GERD    Joint pain    MENOPAUSAL SYNDROME    Multiple food allergies    OBESITY    Shortness of breath    Sleep apnea    wears CPAP    Past Surgical History:  Procedure Laterality Date   ABDOMINAL HYSTERECTOMY  11/1998   total fibroids   CESAREAN SECTION      There were no vitals filed for this visit.   Subjective Assessment - 06/22/21 1120     Subjective Pt reported she wears the thongs sandals most of the time and walks in her tennis shoes. Pt continues to lose weight and is walking daily    Pertinent History C-section, abdominal hysterectomy.    Patient Stated Goals decreaase leakage, have sex and get through pelvic exams without pain,                [facility] PT Assessment - 06/22/21 1122       Observation/Other Assessments   Observations toes adducted in thongs sandal with gripping                        Pelvic Floor Special Questions - 06/22/21 1122     Pelvic Floor Internal Exam pt consented verbally and had no contraindications    Exam Type Vaginal    Palpation tightness/ tenderness 2 o'clock    Strength fair squeeze, definite lift               [facility] Adult PT  Treatment/Exercise - 06/22/21 1123       Therapeutic Activites    Other Therapeutic Activities guided relaxation, recommended changing shoes to promote more relaxation pf pelvic floor by wearing shoes that does not require gripping toes,      Neuro Re-ed    Neuro Re-ed Details  cued for toe abduction and extension for relaxing pelvic floor, cued for knee flexion in forward and back resisted walking      Moist Heat Therapy   Number Minutes Moist Heat 5 Minutes    Moist Heat Location Other (comment)   support child pose , guided relaxation     Manual Therapy   Internal Pelvic Floor STM/MWM at problem areas noted in assessment                          PT Long Term Goals - 06/15/21 0930       PT LONG TERM GOAL #1   Title Pt will demo increased hip abduction strength from 3/5 to >4/5  in order to maintain walking routine and pelvic stability    Time 8    Period Weeks    Status On-going      PT LONG TERM GOAL #2   Title Pt will demo proper deepcore coordination and pelvic floor movement with no cues to progress to kegel exercises    Time 4    Period Weeks    Status Achieved      PT LONG TERM GOAL #3   Title Pt will demo decreased abdominal scar restrictions to advance to deep core exercises and promote IAP for continence and bowel movement elimination with less dependence on Linzess    Time 4    Period Weeks    Status Partially Met      PT LONG TERM GOAL #4   Title Pt will increase FOTO score for constipation from 41 pts and Urinary 47 pts to > 55 pts in order to demo improved pelvic floor function  ( 06/15/21: no change urinary, 2 pt change improvement  with bowel function )    Time 10    Period Weeks    Status On-going      PT LONG TERM GOAL #5   Title Pt will demo decreased pelvic floor mm tightness and proper relaxation of mm to have less pain with intercourcse and gynecological exams    Time 10    Period Weeks    Status Partially Met                    Plan - 06/22/21 1120     Clinical Impression Statement Pt demonstrates decreasing areas of pelvic floor tensions. Pt demo'd improved relaxation with training. Pt required cues for lower kinetic chain co-activation to minimzie overactivity of pelvic floor. Advised pt to not wear her sandals with thongs to prevent toe flexion and associated pelvic floor activity. Pt demo'd proper walking technique with less genu valgus a/ hyperextension of knees, and more transverse arch co-activation after cues.  This training will be beneficial to her as pt continues to walk daily and continues to lose weight. Pt continues to benefit from skilled PT.    Examination-Activity Limitations Toileting;Continence;Other    Stability/Clinical Decision Making Evolving/Moderate complexity    Rehab Potential Good    PT Frequency 1x / week    PT Duration Other (comment)   10   PT Treatment/Interventions Neuromuscular re-education;Cryotherapy;ADLs/Self Care Home Management;Therapeutic activities;Functional mobility training;Traction;Moist Heat;Stair training;Gait training;Patient/family education;Taping;Manual techniques;Scar mobilization;Balance training;Therapeutic exercise;Spinal Manipulations;Joint Manipulations;Energy conservation    Consulted and Agree with Plan of Care Patient             Patient will benefit from skilled therapeutic intervention in order to improve the following deficits and impairments:  Decreased endurance, Decreased safety awareness, Difficulty walking, Decreased coordination, Abnormal gait, Improper body mechanics, Pain, Increased muscle spasms, Hypermobility, Hypomobility, Decreased scar mobility, Decreased mobility, Decreased strength, Postural dysfunction  Visit Diagnosis: Other lack of coordination  Stress incontinence of urine  Other abnormalities of gait and mobility     Problem List Patient Active Problem List   Diagnosis Date Noted   Proteus mirabilis infection  01/16/2021   Cutaneous abscess of right axilla 01/13/2021   Iron deficiency anemia 12/10/2020   At risk for side effect of medication 12/10/2020   At risk for diarrhea 10/27/2020   At risk for diabetes mellitus 10/14/2020   Other constipation 08/19/2020   At risk for dehydration 08/19/2020   Prediabetes 08/06/2020   Chronic constipation 08/06/2020  Other hyperlipidemia 08/06/2020   Vitamin D deficiency 08/06/2020   At risk for heart disease 08/06/2020   Dysuria 04/09/2020   Vaginitis 04/09/2020   Thumb pain, right 11/03/2018   Essential hypertension 05/01/2018   Cough 03/24/2018   SOB (shortness of breath) 03/24/2018   Asthma 03/14/2018   Hidradenitis suppurativa 01/18/2018   GAD (generalized anxiety disorder) 10/11/2016   Chronic shoulder pain 06/09/2016   MENOPAUSAL SYNDROME 04/23/2009   OBESITY 02/15/2008   Depression 04/12/2007   Gastroesophageal reflux disease 04/12/2007   Edema 04/12/2007    Jerl Mina, PT 06/22/2021, 11:28 AM  [place] MAIN [facility] SERVICES [address] [place], [place], [address] Phone: [phone]   Fax:  [phone]  Name: Makiyla Linch MRN: [id number] Date of Birth: [birth year]

## 2021-06-22 NOTE — Patient Instructions (Addendum)
Supported child pose with pillows behind knees, under belly   ( stool and pillow)  And all props  10 min __     WALKING WITH RESISTANCE BLUE Band at waist connected to doorknob 23mns Stepping forward normal length steps, planting mid and forefoot down, center of mass ( navel) leans forward slightly as if you were walking uphill 3-4 steps till band feels taut ( MAKE SURE THE DOOR IS LOCKED AND WON'T OPEN)   Stepping backwards, lower heel slowly, carry trunk and hips back , leaning forward, front knee along 2-3 rd toe line    2 min       __   Changes shoes to spread toes

## 2021-06-29 ENCOUNTER — Other Ambulatory Visit: Payer: Self-pay

## 2021-06-29 ENCOUNTER — Ambulatory Visit: Payer: BC Managed Care – PPO | Admitting: Physical Therapy

## 2021-06-29 DIAGNOSIS — N393 Stress incontinence (female) (male): Secondary | ICD-10-CM

## 2021-06-29 DIAGNOSIS — R278 Other lack of coordination: Secondary | ICD-10-CM | POA: Diagnosis not present

## 2021-06-29 DIAGNOSIS — R2689 Other abnormalities of gait and mobility: Secondary | ICD-10-CM

## 2021-06-29 NOTE — Therapy (Signed)
[name] MAIN [facility] SERVICES [address] [place], [place], [address] Phone: [phone]   Fax:  [phone]  Physical Therapy Treatment  Patient Details  Name: Molly Bean MRN: [id number] Date of Birth: [birth year] Referring Provider (PT): Wannetta Sender MD   Encounter Date: 06/29/2021   PT End of Session - 06/29/21 0913     Visit Number 12    Date for PT Re-Evaluation 40/34/74   PN & recert 2/59/56   PT Start Time 0904    PT Stop Time 1000    PT Time Calculation (min) 56 min    Activity Tolerance Patient tolerated treatment well    Behavior During Therapy [facility] for tasks assessed/performed             Past Medical History:  Diagnosis Date   Anxiety    Constipation    DEPRESSION    edema lower extremeties    GERD    Joint pain    MENOPAUSAL SYNDROME    Multiple food allergies    OBESITY    Shortness of breath    Sleep apnea    wears CPAP    Past Surgical History:  Procedure Laterality Date   ABDOMINAL HYSTERECTOMY  11/1998   total fibroids   CESAREAN SECTION      There were no vitals filed for this visit.   Subjective Assessment - 06/29/21 0906     Subjective Pt reported she is having a L low back pain which came on suddenly. Pt is not sure how it happened. Pt was getting out the bed.The Left side is swollen. There is no radiating pain. Pt had fallen on her Lside back in January.  This is the same area of pain that is hurting today. It hurts when twisting, bending, and lifting L leg from a seated position    Pertinent History C-section, abdominal hysterectomy.    Patient Stated Goals decreaase leakage, have sex and get through pelvic exams without pain,                [facility] PT Assessment - 06/29/21 0916       Palpation   SI assessment  L iliac crest higher and more posterior oriented , R shoulder slightly higher    Palpation comment tightness along ITband, lateral ankle/ adductor hallucis transverse head L                            [facility] Adult PT Treatment/Exercise - 06/29/21 0916       Ambulation/Gait   Gait Comments limited anterior rotation of L pelvis, ( Post Tx: reciprocal gait, rotation of pelvis)      Therapeutic Activites    Other Therapeutic Activities wear tennis shoes, not sandals, explained using strap to stretch not resistance bands      Neuro Re-ed    Neuro Re-ed Details  cued for toe abduction,      Manual Therapy   Internal Pelvic Floor STM/MWM at problem areas noted in assessment to lower iliacc rest, decrease tightness along ITBand, lateral ankle/ toe abduction mobility                          PT Long Term Goals - 06/29/21 0915       PT LONG TERM GOAL #1   Title Pt will demo increased hip abduction strength from 3/5 to >4/5 in order to maintain walking routine and  pelvic stability    Time 8    Period Weeks    Status On-going      PT LONG TERM GOAL #2   Title Pt will demo proper deepcore coordination and pelvic floor movement with no cues to progress to kegel exercises    Time 4    Period Weeks    Status Achieved      PT LONG TERM GOAL #3   Title Pt will demo decreased abdominal scar restrictions to advance to deep core exercises and promote IAP for continence and bowel movement elimination with less dependence on Linzess    Time 4    Period Weeks    Status Partially Met      PT LONG TERM GOAL #4   Title Pt will increase FOTO score for constipation from 41 pts and Urinary 47 pts to > 55 pts in order to demo improved pelvic floor function  ( 06/15/21: no change urinary, 2 pt change improvement  with bowel function )    Time 10    Period Weeks    Status On-going      PT LONG TERM GOAL #5   Title Pt will demo decreased pelvic floor mm tightness and proper relaxation of mm to have less pain with intercourcse and gynecological exams    Time 10    Period Weeks    Status Partially Met                   Plan -  06/29/21 1340     Clinical Impression Statement Today's Tx helped to address L pelvic girdle obliquities and lowered her LBP from 8/10 to 3/10. Pt demo'd levelled alignment and increased L ankle DF and toe abduction. Pt was instructed to switch out of flip flops/ sandals and wear tennis shoes to allow for more toe abduction. Pt continues to benefit from skilled PT.    Examination-Activity Limitations Toileting;Continence;Other    Stability/Clinical Decision Making Evolving/Moderate complexity    Rehab Potential Good    PT Frequency 1x / week    PT Duration Other (comment)   10   PT Treatment/Interventions Neuromuscular re-education;Cryotherapy;ADLs/Self Care Home Management;Therapeutic activities;Functional mobility training;Traction;Moist Heat;Stair training;Gait training;Patient/family education;Taping;Manual techniques;Scar mobilization;Balance training;Therapeutic exercise;Spinal Manipulations;Joint Manipulations;Energy conservation    Consulted and Agree with Plan of Care Patient             Patient will benefit from skilled therapeutic intervention in order to improve the following deficits and impairments:  Decreased endurance, Decreased safety awareness, Difficulty walking, Decreased coordination, Abnormal gait, Improper body mechanics, Pain, Increased muscle spasms, Hypermobility, Hypomobility, Decreased scar mobility, Decreased mobility, Decreased strength, Postural dysfunction  Visit Diagnosis: Other lack of coordination  Stress incontinence of urine  Other abnormalities of gait and mobility     Problem List Patient Active Problem List   Diagnosis Date Noted   Proteus mirabilis infection 01/16/2021   Cutaneous abscess of right axilla 01/13/2021   Iron deficiency anemia 12/10/2020   At risk for side effect of medication 12/10/2020   At risk for diarrhea 10/27/2020   At risk for diabetes mellitus 10/14/2020   Other constipation 08/19/2020   At risk for dehydration  08/19/2020   Prediabetes 08/06/2020   Chronic constipation 08/06/2020   Other hyperlipidemia 08/06/2020   Vitamin D deficiency 08/06/2020   At risk for heart disease 08/06/2020   Dysuria 04/09/2020   Vaginitis 04/09/2020   Thumb pain, right 11/03/2018   Essential hypertension 05/01/2018   Cough 03/24/2018  SOB (shortness of breath) 03/24/2018   Asthma 03/14/2018   Hidradenitis suppurativa 01/18/2018   GAD (generalized anxiety disorder) 10/11/2016   Chronic shoulder pain 06/09/2016   MENOPAUSAL SYNDROME 04/23/2009   OBESITY 02/15/2008   Depression 04/12/2007   Gastroesophageal reflux disease 04/12/2007   Edema 04/12/2007    Jerl Mina, PT 06/29/2021, 1:45 PM  [place] MAIN [facility] SERVICES [address] [place], [place], [address] Phone: [phone]   Fax:  [phone]  Name: Molly Bean MRN: [id number] Date of Birth: [birth year]

## 2021-06-29 NOTE — Patient Instructions (Signed)
Strengthening feet arches:     Heel raises - heels together, minisquat  Knees bent pointed out like a "v" , navel ( center of mass) more forward  Heels together as you lift, pointed out like a "v"  KNEES ARE ALIGNED BEHIND THE TOES TO MINIMIZE STRAIN ON THE KNEES Lower heels down slow with your  navel ( center of mass) more forward to a avoid dropping down fast and rocking more weight back onto heels   10 reps

## 2021-07-01 ENCOUNTER — Other Ambulatory Visit: Payer: Self-pay

## 2021-07-01 ENCOUNTER — Ambulatory Visit (INDEPENDENT_AMBULATORY_CARE_PROVIDER_SITE_OTHER): Payer: BC Managed Care – PPO | Admitting: Family Medicine

## 2021-07-01 ENCOUNTER — Encounter (INDEPENDENT_AMBULATORY_CARE_PROVIDER_SITE_OTHER): Payer: Self-pay | Admitting: Family Medicine

## 2021-07-01 VITALS — BP 116/81 | HR 63 | Temp 98.7°F | Ht 62.0 in | Wt 223.0 lb

## 2021-07-01 DIAGNOSIS — F39 Unspecified mood [affective] disorder: Secondary | ICD-10-CM

## 2021-07-01 DIAGNOSIS — E7849 Other hyperlipidemia: Secondary | ICD-10-CM

## 2021-07-01 DIAGNOSIS — Z6841 Body Mass Index (BMI) 40.0 and over, adult: Secondary | ICD-10-CM

## 2021-07-01 DIAGNOSIS — R7303 Prediabetes: Secondary | ICD-10-CM | POA: Diagnosis not present

## 2021-07-01 DIAGNOSIS — Z9189 Other specified personal risk factors, not elsewhere classified: Secondary | ICD-10-CM | POA: Diagnosis not present

## 2021-07-01 MED ORDER — METFORMIN HCL 500 MG PO TABS: 500.0000 mg | ORAL_TABLET | Freq: Two times a day (BID) | ORAL | 0 refills | Status: DC

## 2021-07-01 MED ORDER — SEMAGLUTIDE (1 MG/DOSE) 4 MG/3ML ~~LOC~~ SOPN: 1.0000 mg | PEN_INJECTOR | SUBCUTANEOUS | 0 refills | Status: DC

## 2021-07-01 MED ORDER — BUPROPION HCL ER (SR) 150 MG PO TB12: 150.0000 mg | ORAL_TABLET | Freq: Every morning | ORAL | 0 refills | Status: DC

## 2021-07-02 LAB — LIPID PANEL
Chol/HDL Ratio: 2.9 ratio (ref 0.0–4.4)
Cholesterol, Total: 169 mg/dL (ref 100–199)
HDL: 58 mg/dL (ref >39)
LDL Chol Calc (NIH): 99 mg/dL (ref 0–99)
Triglycerides: 63 mg/dL (ref 0–149)
VLDL Cholesterol Cal: 12 mg/dL (ref 5–40)

## 2021-07-02 LAB — COMPREHENSIVE METABOLIC PANEL
ALT: 15 IU/L (ref 0–32)
AST: 16 IU/L (ref 0–40)
Albumin/Globulin Ratio: 1.8 (ref 1.2–2.2)
Albumin: 4.4 g/dL (ref 3.8–4.8)
Alkaline Phosphatase: 74 IU/L (ref 44–121)
BUN/Creatinine Ratio: 13 (ref 12–28)
BUN: 10 mg/dL (ref 8–27)
Bilirubin Total: 0.2 mg/dL (ref 0.0–1.2)
CO2: 26 mmol/L (ref 20–29)
Calcium: 9.4 mg/dL (ref 8.7–10.3)
Chloride: 104 mmol/L (ref 96–106)
Creatinine, Ser: 0.78 mg/dL (ref 0.57–1.00)
Globulin, Total: 2.4 g/dL (ref 1.5–4.5)
Glucose: 75 mg/dL (ref 70–99)
Potassium: 4.3 mmol/L (ref 3.5–5.2)
Sodium: 143 mmol/L (ref 134–144)
Total Protein: 6.8 g/dL (ref 6.0–8.5)
eGFR: 86 mL/min/{1.73_m2} (ref >59)

## 2021-07-02 LAB — HEMOGLOBIN A1C
Est. average glucose Bld gHb Est-mCnc: 123 mg/dL
Hgb A1c MFr Bld: 5.9 % — ABNORMAL HIGH (ref 4.8–5.6)

## 2021-07-02 NOTE — Progress Notes (Signed)
Chief Complaint:   OBESITY Molly Bean is here to discuss her progress with her obesity treatment plan along with follow-up of her obesity related diagnoses. Molly Bean is on the Category 2 Plan and states she is following her eating plan approximately 80% of the time. Molly Bean states she is walking for 30 minutes 4-7 times per week.  Today's visit was #: 20 Starting weight: 231 lbs Starting date: 04/15/2020 Today's weight: 223 lbs Today's date: 07/01/2021 Total lbs lost to date: 8 Total lbs lost since last in-office visit: 2  Interim History: Molly Bean was sick with gastroenteritis (nausea and vomiting) for 3 days. This occurred after she increased her dose of Ozempic from 0.5 mg to 1 mg. She never had any GI symptoms prior to when starting or increasing dose before.  Subjective:   1. Pre-diabetes Molly Bean is taking Ozempic and metformin. She is helping with hunger and cravings, and helping with carbohydrates and decreased desire for them. She is happy with her medications.  2. Other hyperlipidemia Molly Bean's Cardiologist placed her on cholesterol medications on 04/27/2021. She is tolerating medication(s) well without side effects.  Medication compliance is good and patient appears to be taking it as prescribed.  Denies additional concerns regarding this condition.   3. Mood disorder (Molly Bean)- emotional eating Molly Bean is struggling with emotional eating and using food for comfort to the extent that it is negatively impacting her health. She has been working on behavior modification techniques to help reduce her emotional eating and has been somewhat successful. Her mood is stable, and she shows no sign of suicidal or homicidal ideations.  4. At risk for diabetes mellitus Molly Bean is at higher than average risk for developing diabetes due to obesity.   Assessment/Plan:   Orders Placed This Encounter  Procedures   Hemoglobin A1c   Lipid panel   Comprehensive metabolic panel     Medications Discontinued During This Encounter  Medication Reason   metFORMIN (GLUCOPHAGE) 500 MG tablet Error   buPROPion (WELLBUTRIN SR) 150 MG 12 hr tablet Reorder   Semaglutide, 1 MG/DOSE, 4 MG/3ML SOPN Reorder     Meds ordered this encounter  Medications   buPROPion (WELLBUTRIN SR) 150 MG 12 hr tablet    Sig: Take 1 tablet (150 mg total) by mouth in the morning.    Dispense:  30 tablet    Refill:  0    Ov for rf   metFORMIN (GLUCOPHAGE) 500 MG tablet    Sig: Take 1 tablet (500 mg total) by mouth 2 (two) times daily with a meal.    Dispense:  60 tablet    Refill:  0    Ov for rf   Semaglutide, 1 MG/DOSE, 4 MG/3ML SOPN    Sig: Inject 1 mg as directed once a week.    Dispense:  3 mL    Refill:  0    One mo supply; ov for rf     1. Pre-diabetes We will check labs today, and we will refill metformin for 1 month. Deniesha agreed to decrease Ozempic to 0.5 mg weekly; refill was given of the 1 mg but she will use 1/2 ways point and give herself 0.5 mg as she knows how to do it. She will continue to work on weight loss, exercise, and decreasing simple carbohydrates to help decrease the risk of diabetes.   - metFORMIN (GLUCOPHAGE) 500 MG tablet; Take 1 tablet (500 mg total) by mouth 2 (two) times daily with a meal.  Dispense: 60  tablet; Refill: 0 - Semaglutide, 1 MG/DOSE, 4 MG/3ML SOPN; Inject 1 mg as directed once a week.  Dispense: 3 mL; Refill: 0 - Hemoglobin A1c  2. Other hyperlipidemia Cardiovascular risk and specific lipid/LDL goals reviewed. We discussed several lifestyle modifications today. Molly Bean will continue Crestor, and we will check labs today. She will continue her prudent nutritional plan with decreased saturated and trans fats. She is to keep her follow up with Cardiology on October 10th. Orders and follow up as documented in patient record.   Counseling Intensive lifestyle modifications are the first line treatment for this issue. Dietary changes: Increase  soluble fiber. Decrease simple carbohydrates. Exercise changes: Moderate to vigorous-intensity aerobic activity 150 minutes per week if tolerated. Lipid-lowering medications: see documented in medical record.  - Lipid panel - Comprehensive metabolic panel  3. Mood disorder ([place])- emotional eating Behavior modification techniques were discussed today to help Molly Bean. We will refill Wellbutrin SR for 1 month. Orders and follow up as documented in patient record.   - buPROPion (WELLBUTRIN SR) 150 MG 12 hr tablet; Take 1 tablet (150 mg total) by mouth in the morning.  Dispense: 30 tablet; Refill: 0  4. At risk for diabetes mellitus Molly Bean was given approximately 10 minutes of diabetes education and counseling today. We discussed intensive lifestyle modifications today with an emphasis on weight loss as well as increasing exercise and decreasing simple carbohydrates in her diet. We also reviewed medication options with an emphasis on risk versus benefit of those discussed.   Repetitive spaced learning was employed today to elicit superior memory formation and behavioral change.  5. Obesity with current BMI of 40.9 Molly Bean is currently in the action stage of change. As such, her goal is to continue with weight loss efforts. She has agreed to change to the Category 1 Plan.   We will send labs results to Cardiology.   Exercise goals: As is.  Behavioral modification strategies: increasing lean protein intake, decreasing simple carbohydrates, and planning for success.  Molly Bean has agreed to follow-up with our clinic in 3 weeks. She was informed of the importance of frequent follow-up visits to maximize her success with intensive lifestyle modifications for her multiple health conditions.   Objective:   Blood pressure 116/81, pulse 63, temperature 98.7 F (37.1 C), height 5\' 2"  (1.575 m), weight 223 lb (101.2 kg), SpO2 99 %. Body mass  index is 40.79 kg/m.  General: Cooperative, alert, well developed, in no acute distress. HEENT: Conjunctivae and lids unremarkable. Cardiovascular: Regular rhythm.  Lungs: Normal work of breathing. Neurologic: No focal deficits.   Lab Results  Component Value Date   CREATININE 0.78 07/01/2021   BUN 10 07/01/2021   NA 143 07/01/2021   K 4.3 07/01/2021   CL 104 07/01/2021   CO2 26 07/01/2021   Lab Results  Component Value Date   ALT 15 07/01/2021   AST 16 07/01/2021   ALKPHOS 74 07/01/2021   BILITOT 0.2 07/01/2021   Lab Results  Component Value Date   HGBA1C 5.9 (H) 07/01/2021   HGBA1C 6.0 (H) 03/23/2021   HGBA1C 6.2 (H) 12/30/2020   HGBA1C 6.1 (H) 08/19/2020   HGBA1C 6.1 (H) 04/15/2020   Lab Results  Component Value Date   INSULIN 21.1 03/23/2021   INSULIN 21.9 04/15/2020   Lab Results  Component Value Date   TSH 1.100 04/15/2020   Lab Results  Component Value Date   CHOL 169 07/01/2021   HDL 58 07/01/2021  LDLCALC 99 07/01/2021   LDLDIRECT 95 12/30/2020   TRIG 63 07/01/2021   CHOLHDL 2.9 07/01/2021   Lab Results  Component Value Date   VD25OH 47.7 05/20/2021   VD25OH 46.9 12/30/2020   VD25OH 24.3 (L) 08/19/2020   Lab Results  Component Value Date   WBC 9.2 12/30/2020   HGB 12.6 12/30/2020   HCT 40.6 12/30/2020   MCV 69 (L) 12/30/2020   PLT 220 12/30/2020   Lab Results  Component Value Date   IRON 51 12/30/2020   TIBC 300 12/30/2020   FERRITIN 55 12/30/2020   Attestation Statements:   Reviewed by clinician on day of visit: allergies, medications, problem list, medical history, surgical history, family history, social history, and previous encounter notes.   Wilhemena Durie, am acting as transcriptionist for [employer], DO.  I have reviewed the above documentation for accuracy and completeness, and I agree with the above. Marjory Sneddon, D.O.  The [place] was signed into law in 2016 which includes the topic of  electronic health records.  This provides immediate access to information in MyChart.  This includes consultation notes, operative notes, office notes, lab results and pathology reports.  If you have any questions about what you read please let us know at your next visit so we can discuss your concerns and take corrective action if need be.  We are right here with you.

## 2021-07-04 ENCOUNTER — Other Ambulatory Visit (INDEPENDENT_AMBULATORY_CARE_PROVIDER_SITE_OTHER): Payer: Self-pay | Admitting: Family Medicine

## 2021-07-04 DIAGNOSIS — F39 Unspecified mood [affective] disorder: Secondary | ICD-10-CM

## 2021-07-06 NOTE — Telephone Encounter (Signed)
Last seen by Dr Raliegh Scarlet on 07/01/21

## 2021-07-06 NOTE — Telephone Encounter (Signed)
Last OV with Dr. Beasley 

## 2021-07-07 ENCOUNTER — Other Ambulatory Visit: Payer: Self-pay

## 2021-07-07 ENCOUNTER — Encounter: Payer: Self-pay | Admitting: Internal Medicine

## 2021-07-07 ENCOUNTER — Ambulatory Visit (INDEPENDENT_AMBULATORY_CARE_PROVIDER_SITE_OTHER): Payer: BC Managed Care – PPO | Admitting: Internal Medicine

## 2021-07-07 VITALS — BP 132/78 | HR 70 | Temp 98.4°F | Ht 62.0 in | Wt 223.0 lb

## 2021-07-07 DIAGNOSIS — I10 Essential (primary) hypertension: Secondary | ICD-10-CM

## 2021-07-07 DIAGNOSIS — L732 Hidradenitis suppurativa: Secondary | ICD-10-CM

## 2021-07-07 MED ORDER — DOXYCYCLINE HYCLATE 100 MG PO TABS: 100.0000 mg | ORAL_TABLET | Freq: Two times a day (BID) | ORAL | 0 refills | Status: DC

## 2021-07-07 NOTE — Patient Instructions (Signed)
Hidradenitis Suppurativa Hidradenitis suppurativa is a long-term (chronic) skin disease. It is similar to a severe form of acne, but it affects areas of the body where acne would be unusual, especially areas of the body where skin rubs against skin and becomes moist. These include: Underarms. Groin. Genital area. Buttocks. Upper thighs. Breasts. Hidradenitis suppurativa may start out as small lumps or pimples caused by blocked sweat glands or hair follicles. Pimples may develop into deep sores that break open (rupture) and drain pus. Over time, affected areas of skin may thicken and become scarred. This condition is rare and does not spread from person to person (non-contagious). What are the causes? The exact cause of this condition is not known. It may be related to: Female and female hormones. An overactive disease-fighting system (immune system). The immune system may over-react to blocked hair follicles or sweat glands and cause swelling and pus-filled sores. What increases the risk? You are more likely to develop this condition if you: Are female. Are 11-55 years old. Have a family history of hidradenitis suppurativa. Have a personal history of acne. Are overweight. Smoke. Take the medicine lithium. What are the signs or symptoms? The first symptoms are usually painful bumps in the skin, similar to pimples. The condition may get worse over time (progress), or it may only cause mild symptoms. If the disease progresses, symptoms may include: Skin bumps getting bigger and growing deeper into the skin. Bumps rupturing and draining pus. Itchy, infected skin. Skin getting thicker and scarred. Tunnels under the skin (fistulas) where pus drains from a bump. Pain during daily activities, such as pain during walking if your groin area is affected. Emotional problems, such as stress or depression. This condition may affect your appearance and your ability or willingness to wear certain clothes  or do certain activities. How is this diagnosed? This condition is diagnosed by a health care provider who specializes in skin diseases (dermatologist). You may be diagnosed based on: Your symptoms and medical history. A physical exam. Testing a pus sample for infection. Blood tests. How is this treated? Your treatment will depend on how severe your symptoms are. The same treatment will not work for everybody with this condition. You may need to try several treatments to find what works best for you. Treatment may include: Cleaning and bandaging (dressing) your wounds as needed. Lifestyle changes, such as new skin care routines. Taking medicines, such as: Antibiotics. Acne medicines. Medicines to reduce the activity of the immune system. A diabetes medicine (metformin). Birth control pills, for women. Steroids to reduce swelling and pain. Working with a mental health care provider, if you experience emotional distress due to this condition. If you have severe symptoms that do not get better with medicine, you may need surgery. Surgery may involve: Using a laser to clear the skin and remove hair follicles. Opening and draining deep sores. Removing the areas of skin that are diseased and scarred. Follow these instructions at home: Medicines  Take over-the-counter and prescription medicines only as told by your health care provider. If you were prescribed an antibiotic medicine, take it as told by your health care provider. Do not stop taking the antibiotic even if your condition improves. Skin care If you have open wounds, cover them with a clean dressing as told by your health care provider. Keep wounds clean by washing them gently with soap and water when you bathe. Do not shave the areas where you get hidradenitis suppurativa. Do not wear deodorant. Wear loose-fitting   clothes. Try to avoid getting overheated or sweaty. If you get sweaty or wet, change into clean, dry clothes as soon  as you can. To help relieve pain and itchiness, cover sore areas with a warm, clean washcloth (warm compress) for 5-10 minutes as often as needed. If told by your health care provider, take a bleach bath twice a week: Fill your bathtub halfway with water. Pour in  cup of unscented household bleach. Soak in the tub for 5-10 minutes. Only soak from the neck down. Avoid water on your face and hair. Shower to rinse off the bleach from your skin. General instructions Learn as much as you can about your disease so that you have an active role in your treatment. Work closely with your health care provider to find treatments that work for you. If you are overweight, work with your health care provider to lose weight as recommended. Do not use any products that contain nicotine or tobacco, such as cigarettes and e-cigarettes. If you need help quitting, ask your health care provider. If you struggle with living with this condition, talk with your health care provider or work with a mental health care provider as recommended. Keep all follow-up visits as told by your health care provider. This is important. Where to find more information Hidradenitis [place].: https://www.hs-foundation.org/ American Academy of Dermatology: http://www.nguyen-hutchinson.com/ Contact a health care provider if you have: A flare-up of hidradenitis suppurativa. A fever or chills. Trouble controlling your symptoms at home. Trouble doing your daily activities because of your symptoms. Trouble dealing with emotional problems related to your condition. Summary Hidradenitis suppurativa is a long-term (chronic) skin disease. It is similar to a severe form of acne, but it affects areas of the body where acne would be unusual. The first symptoms are usually painful bumps in the skin, similar to pimples. The condition may only cause mild symptoms, or it may get worse over time (progress). If you have open wounds, cover them  with a clean dressing as told by your health care provider. Keep wounds clean by washing them gently with soap and water when you bathe. Besides skin care, treatment may include medicines, laser treatment, and surgery. This information is not intended to replace advice given to you by your health care provider. Make sure you discuss any questions you have with your health care provider. Document Revised: 07/15/2020 Document Reviewed: 07/15/2020 Elsevier Patient Education  2022 [employer].

## 2021-07-07 NOTE — Progress Notes (Signed)
Subjective:  Patient ID: Molly Bean, female    DOB: [birth year]  Age: 60 y.o. MRN: [id number]  CC: Follow-up  This visit occurred during the SARS-CoV-2 public health emergency.  Safety protocols were in place, including screening questions prior to the visit, additional usage of staff PPE, and extensive cleaning of exam room while observing appropriate contact time as indicated for disinfecting solutions.    HPI Molly Bean presents for f/up -  She remains concerned about her armpits. I drained an abscess many months ago and she does not have a painful cyst like she did back then but she remains concerned about the appearance of her axillary area.  Outpatient Medications Prior to Visit  Medication Sig Dispense Refill   acetaminophen (TYLENOL) 500 MG tablet Take 1,000 mg by mouth every 6 (six) hours as needed for mild pain or headache.     albuterol (VENTOLIN HFA) 108 (90 Base) MCG/ACT inhaler TAKE 2 PUFFS BY MOUTH EVERY 6 HOURS AS NEEDED FOR WHEEZE OR SHORTNESS OF BREATH 8.5 g 0   buPROPion (WELLBUTRIN SR) 150 MG 12 hr tablet Take 1 tablet (150 mg total) by mouth in the morning. 30 tablet 0   Ferrous Sulfate (IRON) 325 (65 Fe) MG TABS Take 1 tablet by mouth daily at 2 PM.     linaclotide (LINZESS) 145 MCG CAPS capsule Take 1 capsule (145 mcg total) by mouth daily before breakfast. 30 capsule 0   metFORMIN (GLUCOPHAGE) 500 MG tablet Take 1 tablet (500 mg total) by mouth 2 (two) times daily with a meal. 60 tablet 0   metFORMIN (GLUCOPHAGE) 500 MG tablet Take 500 mg by mouth 2 (two) times daily with a meal.     rosuvastatin (CRESTOR) 5 MG tablet Take 1 tablet (5 mg total) by mouth daily. 90 tablet 3   Semaglutide, 1 MG/DOSE, 4 MG/3ML SOPN Inject 1 mg as directed once a week. 3 mL 0   triamterene-hydrochlorothiazide (MAXZIDE-25) 37.5-25 MG tablet TAKE 0.5-1 TABLET BY MOUTH DAILY. 90 tablet 1   Trospium Chloride 60 MG CP24 Take 1 capsule (60 mg total) by mouth daily. 30 capsule 5    Vitamin D, Ergocalciferol, (DRISDOL) 1.25 MG (50000 UNIT) CAPS capsule 1 po q wed, and 1 po q sun 8 capsule 0   No facility-administered medications prior to visit.    ROS Review of Systems  Constitutional: Negative.  Negative for diaphoresis and fatigue.  HENT: Negative.    Eyes: Negative.   Respiratory:  Negative for cough, chest tightness, shortness of breath and wheezing.   Cardiovascular:  Negative for chest pain, palpitations and leg swelling.  Gastrointestinal:  Negative for abdominal pain, constipation, diarrhea, nausea and vomiting.  Endocrine: Negative.   Genitourinary: Negative.  Negative for difficulty urinating.  Musculoskeletal:  Negative for arthralgias and myalgias.  Skin:  Positive for rash.  Neurological:  Negative for dizziness, weakness and light-headedness.  Hematological:  Negative for adenopathy. Does not bruise/bleed easily.  Psychiatric/Behavioral: Negative.     Objective:  BP 132/78 (BP Location: Right Arm, Patient Position: Sitting, Cuff Size: Large)   Pulse 70   Temp 98.4 F (36.9 C) (Oral)   Ht 5\' 2"  (1.575 m)   Wt 223 lb (101.2 kg)   SpO2 98%   BMI 40.79 kg/m   BP Readings from Last 3 Encounters:  07/07/21 132/78  07/01/21 116/81  06/10/21 131/87    Wt Readings from Last 3 Encounters:  07/07/21 223 lb (101.2 kg)  07/01/21 223 lb (101.2 kg)  06/10/21 225 lb (102.1 kg)    Physical Exam Vitals reviewed.  Constitutional:      Appearance: Normal appearance.  HENT:     Mouth/Throat:     Mouth: Mucous membranes are moist.  Eyes:     Conjunctiva/sclera: Conjunctivae normal.  Cardiovascular:     Rate and Rhythm: Normal rate and regular rhythm.     Heart sounds: No murmur heard. Pulmonary:     Effort: Pulmonary effort is normal.     Breath sounds: No stridor. No wheezing, rhonchi or rales.  Abdominal:     General: Abdomen is protuberant. Bowel sounds are normal. There is no distension.     Palpations: Abdomen is soft. There is no  hepatomegaly, splenomegaly or mass.  Musculoskeletal:        General: Normal range of motion.     Cervical back: Neck supple.     Right lower leg: No edema.     Left lower leg: No edema.  Lymphadenopathy:     Cervical: No cervical adenopathy.  Skin:    Findings: Rash present.     Comments: In both axillae there are band-like scars with PIPA and a few, small subQ cysts. There is no accumulation c/w and abscess that needs to be drained  Neurological:     General: No focal deficit present.     Mental Status: She is alert.  Psychiatric:        Mood and Affect: Mood normal.        Behavior: Behavior normal.    Lab Results  Component Value Date   WBC 9.2 12/30/2020   HGB 12.6 12/30/2020   HCT 40.6 12/30/2020   PLT 220 12/30/2020   GLUCOSE 75 07/01/2021   CHOL 169 07/01/2021   TRIG 63 07/01/2021   HDL 58 07/01/2021   LDLDIRECT 95 12/30/2020   LDLCALC 99 07/01/2021   ALT 15 07/01/2021   AST 16 07/01/2021   NA 143 07/01/2021   K 4.3 07/01/2021   CL 104 07/01/2021   CREATININE 0.78 07/01/2021   BUN 10 07/01/2021   CO2 26 07/01/2021   TSH 1.100 04/15/2020   INR 1.00 09/25/2016   HGBA1C 5.9 (H) 07/01/2021    CT Soft Tissue Neck W Contrast  Result Date: 11/22/2019 CLINICAL DATA:  Cervical lymphadenopathy on chest CT. EXAM: CT NECK WITH CONTRAST TECHNIQUE: Multidetector CT imaging of the neck was performed using the standard protocol following the bolus administration of intravenous contrast. CONTRAST:  43mL OMNIPAQUE IOHEXOL 300 MG/ML  SOLN COMPARISON:  Chest CT from 1 week ago FINDINGS: Pharynx and larynx: No evidence of mass or swelling. Salivary glands: No inflammation, mass, or stone. Thyroid: Normal. Lymph nodes: None enlarged or abnormal density. The CT finding on prior was partially covered anterior belly of the right digastric. Vascular: Negative Limited intracranial: Negative Visualized orbits: Negative Mastoids and visualized paranasal sinuses:Mucosal thickening in the  maxillary sinuses, most notable on the left where there is a periapical erosion affecting the remaining left upper molar with tooth root projecting into the inferior sinus. Skeleton: Lower cervical disc degeneration. No acute or aggressive finding. Upper chest: Negative IMPRESSION: 1. Negative for lymphadenopathy. The chest CT finding was partial coverage of the anterior belly digastric. 2. Mild left-sided maxillary sinusitis which may be odontogenic Electronically Signed   By: Monte Fantasia M.D.   On: 11/22/2019 04:52    Assessment & Plan:   Kayti was seen today for follow-up.  Diagnoses and all orders for this visit:  Axillary  hidradenitis suppurativa- Will treat with long term doxycycline to treat the infection and reduce the inflammation. -     doxycycline (VIBRA-TABS) 100 MG tablet; Take 1 tablet (100 mg total) by mouth 2 (two) times daily.  Essential hypertension- Her BP is adequately well controlled.  I am having Marcia Whitecotton start on doxycycline. I am also having her maintain her acetaminophen, triamterene-hydrochlorothiazide, Iron, rosuvastatin, albuterol, Trospium Chloride, Vitamin D (Ergocalciferol), linaclotide, buPROPion, metFORMIN, Semaglutide (1 MG/DOSE), and metFORMIN.  Meds ordered this encounter  Medications   doxycycline (VIBRA-TABS) 100 MG tablet    Sig: Take 1 tablet (100 mg total) by mouth 2 (two) times daily.    Dispense:  180 tablet    Refill:  0     Follow-up: Return in about 3 months (around 10/07/2021).  Scarlette Calico, MD

## 2021-07-08 ENCOUNTER — Other Ambulatory Visit (INDEPENDENT_AMBULATORY_CARE_PROVIDER_SITE_OTHER): Payer: Self-pay | Admitting: Family Medicine

## 2021-07-08 DIAGNOSIS — E559 Vitamin D deficiency, unspecified: Secondary | ICD-10-CM

## 2021-07-08 NOTE — Telephone Encounter (Signed)
Dr.Opalski ?

## 2021-07-10 NOTE — Progress Notes (Signed)
 Urogynecology Return Visit  SUBJECTIVE  History of Present Illness: Morenike Cuff is a 60 y.o. female seen in follow-up for mixed incontinence and levator spasm. Plan at last visit was for physical therapy and flexeril was started.   Urgency and leakage has not changed with trospium. She has not seen any improvement in her symptoms. Denies leakage with cough and sneeze. Has seen benefit from physical therapy and is continuing to attend.   Past Medical History: Patient  has a past medical history of Anxiety, Constipation, DEPRESSION, edema lower extremeties, GERD, Joint pain, MENOPAUSAL SYNDROME, Multiple food allergies, OBESITY, Shortness of breath, and Sleep apnea.   Past Surgical History: She  has a past surgical history that includes Cesarean section and Abdominal hysterectomy (11/1998).   Medications: She has a current medication list which includes the following prescription(s): acetaminophen, albuterol, bupropion, doxycycline, iron, linaclotide, metformin, metformin, rosuvastatin, semaglutide (1 mg/dose), solifenacin, triamterene-hydrochlorothiazide, and vitamin d (ergocalciferol).   Allergies: Patient is allergic to pennsaid [diclofenac sodium], oxycodone, and shellfish allergy.   Social History: Patient  reports that she quit smoking about 43 years ago. Her smoking use included cigarettes. She has a 6.00 pack-year smoking history. She quit smokeless tobacco use about 33 years ago. She reports that she does not drink alcohol and does not use drugs.      OBJECTIVE     Physical Exam: Vitals:   07/13/21 0823  BP: 120/81  Pulse: 62  Weight: 223 lb (101.2 kg)  Height: 5\' 2"  (1.575 m)    Gen: No apparent distress, A&O x 3.  Detailed Urogynecologic Evaluation:  Deferred.    ASSESSMENT AND PLAN    Ms. Bias is a 60 y.o. with:  1. Overactive bladder     - Stop Trospium and start Vesicare 5mg  daily.  - We discussed that if she has tried several  medications then she should consider third line therapies and briefly reviewed these today.  - Continue physical therapy  Follow up 6 weeks  Jaquita Folds, MD   Time spent: I spent 20 minutes dedicated to the care of this patient on the date of this encounter to include pre-visit review of records, face-to-face time with the patient and post visit documentation and ordering medication/ testing.

## 2021-07-13 ENCOUNTER — Ambulatory Visit (INDEPENDENT_AMBULATORY_CARE_PROVIDER_SITE_OTHER): Payer: BC Managed Care – PPO | Admitting: Obstetrics and Gynecology

## 2021-07-13 ENCOUNTER — Other Ambulatory Visit: Payer: Self-pay

## 2021-07-13 ENCOUNTER — Ambulatory Visit: Payer: BC Managed Care – PPO | Attending: Family | Admitting: Physical Therapy

## 2021-07-13 ENCOUNTER — Encounter: Payer: Self-pay | Admitting: Obstetrics and Gynecology

## 2021-07-13 VITALS — BP 120/81 | HR 62 | Ht 62.0 in | Wt 223.0 lb

## 2021-07-13 DIAGNOSIS — R2689 Other abnormalities of gait and mobility: Secondary | ICD-10-CM | POA: Diagnosis present

## 2021-07-13 DIAGNOSIS — N3281 Overactive bladder: Secondary | ICD-10-CM | POA: Diagnosis not present

## 2021-07-13 DIAGNOSIS — R278 Other lack of coordination: Secondary | ICD-10-CM

## 2021-07-13 DIAGNOSIS — M533 Sacrococcygeal disorders, not elsewhere classified: Secondary | ICD-10-CM | POA: Insufficient documentation

## 2021-07-13 DIAGNOSIS — N393 Stress incontinence (female) (male): Secondary | ICD-10-CM | POA: Diagnosis present

## 2021-07-13 MED ORDER — SOLIFENACIN SUCCINATE 5 MG PO TABS: 5.0000 mg | ORAL_TABLET | Freq: Every day | ORAL | 5 refills | Status: DC

## 2021-07-13 NOTE — Patient Instructions (Addendum)
PELVIC FLOOR / KEGEL EXERCISES   Pelvic floor/ Kegel exercises are used to strengthen the muscles in the base of your pelvis that are responsible for supporting your pelvic organs and preventing urine/feces leakage. Based on your therapist's recommendations, they can be performed while standing, sitting, or lying down. Imagine pelvic floor area as a diamond with pelvic landmarks: top =pubic bone, bottom tip=tailbone, sides=sitting bones (ischial tuberosities).    Make yourself aware of this muscle group by using these cues while coordinating your breath: Inhale, feel pelvic floor diamond area lower like hammock towards your feet and ribcage/belly expanding. Pause. Let the exhale naturally and feel your belly sink, abdominal muscles hugging in around you and you may notice the pelvic diamond draws upward towards your head forming a umbrella shape. Give a squeeze during the exhalation like you are stopping the flow of urine. If you are squeezing the buttock muscles, try to give 50% less effort.   Common Errors: Breath holding: If you are holding your breath, you may be bearing down against your bladder instead of pulling it up. If you belly bulges up while you are squeezing, you are holding your breath. Be sure to breathe gently in and out while exercising. Counting out loud may help you avoid holding your breath. Accessory muscle use: You should not see or feel other muscle movement when performing pelvic floor exercises. When done properly, no one can tell that you are performing the exercises. Keep the buttocks, belly and inner thighs relaxed. Overdoing it: Your muscles can fatigue and stop working for you if you over-exercise. You may actually leak more or feel soreness at the lower abdomen or rectum.    SHORT HOLDS: Position: on back, spread toes, firm feet  Inhale and then exhale. Then squeeze the muscle.  (Be sure to let belly sink in with exhales and not push outward) Perform 5 repetitions,  5 different  Times/day      Deep core level 1 and 2 ( with   Practice proper pelvic floor coordination  Inhale: expand pelvic floor muscles Exhale" "j" scoop, allow pelvic floor to close, lift first before belly sinks   ( not "draw abdominal muscle to spine" or strain with abdominal muscles")      ___  Happy baby pose with towel under thighs ,wide knees to stretch pelvic floor

## 2021-07-13 NOTE — Therapy (Addendum)
[place] [facility] SERVICES [address] [place], [place], [address] Phone: [phone]   Fax:  [phone]  Physical Therapy Treatment  Patient Details  Name: Molly Bean MRN: [id number] Date of Birth: [birth year] Referring Provider (PT): Wannetta Sender MD   Encounter Date: 07/13/2021   PT End of Session - 07/13/21 1053     Visit Number 13    Date for PT Re-Evaluation 69/62/95   PN & recert 2/84/13   PT Start Time 1004    PT Stop Time 1057    PT Time Calculation (min) 53 min    Activity Tolerance Patient tolerated treatment well    Behavior During Therapy [facility] for tasks assessed/performed             Past Medical History:  Diagnosis Date   Anxiety    Constipation    DEPRESSION    edema lower extremeties    GERD    Joint pain    MENOPAUSAL SYNDROME    Multiple food allergies    OBESITY    Shortness of breath    Sleep apnea    wears CPAP    Past Surgical History:  Procedure Laterality Date   ABDOMINAL HYSTERECTOMY  11/1998   total fibroids   CESAREAN SECTION      There were no vitals filed for this visit.   Subjective Assessment - 07/13/21 1009     Subjective Pt reported she is feeling that PT is the best progress she has had. Pt was explained by her urogynecologist of different option treatments that pt is not interested in right now.    Pertinent History C-section, abdominal hysterectomy.    Patient Stated Goals decreaase leakage, have sex and get through pelvic exams without pain,                            Pelvic Floor Special Questions - 07/13/21 1048     Pelvic Floor Internal Exam pt consented verbally and had no contraindications    Exam Type Vaginal    Palpation tightness/ tenderness  Obt int L, tighter C section scar on L > R, suprapubic scar restricted    Strength fair squeeze, definite lift    Strength # of reps 5               [facility] Adult PT Treatment/Exercise - 07/13/21  1049       Neuro Re-ed    Neuro Re-ed Details  cued for upward movement of pelvic floor not from ab      Manual Therapy   Internal Pelvic Floor STM/MWM internal and external  at problem areas noted in assessment to improve mobility                          PT Long Term Goals - 06/29/21 0915       PT LONG TERM GOAL #1   Title Pt will demo increased hip abduction strength from 3/5 to >4/5 in order to maintain walking routine and pelvic stability    Time 8    Period Weeks    Status On-going      PT LONG TERM GOAL #2   Title Pt will demo proper deepcore coordination and pelvic floor movement with no cues to progress to kegel exercises    Time 4    Period Weeks    Status Achieved      PT  LONG TERM GOAL #3   Title Pt will demo decreased abdominal scar restrictions to advance to deep core exercises and promote IAP for continence and bowel movement elimination with less dependence on Linzess    Time 4    Period Weeks    Status Partially Met      PT LONG TERM GOAL #4   Title Pt will increase FOTO score for constipation from 41 pts and Urinary 47 pts to > 55 pts in order to demo improved pelvic floor function  ( 06/15/21: no change urinary, 2 pt change improvement  with bowel function )    Time 10    Period Weeks    Status On-going      PT LONG TERM GOAL #5   Title Pt will demo decreased pelvic floor mm tightness and proper relaxation of mm to have less pain with intercourcse and gynecological exams    Time 10    Period Weeks    Status Partially Met                   Plan - 07/13/21 1053     Clinical Impression Statement Pt continued to required  pelvic floor Tx to minimize L mm tightness and C-section restrictions. Pt progressed to  5 quick contraction with cues for less abdominal mm overuse. Pt continues to benefit from skilled PT. Plan to add more abdominal strengthening and further release C-section scar at next session.    Examination-Activity  Limitations Toileting;Continence;Other    Stability/Clinical Decision Making Evolving/Moderate complexity    Rehab Potential Good    PT Frequency 1x / week    PT Duration Other (comment)   10   PT Treatment/Interventions Neuromuscular re-education;Cryotherapy;ADLs/Self Care Home Management;Therapeutic activities;Functional mobility training;Traction;Moist Heat;Stair training;Gait training;Patient/family education;Taping;Manual techniques;Scar mobilization;Balance training;Therapeutic exercise;Spinal Manipulations;Joint Manipulations;Energy conservation    Consulted and Agree with Plan of Care Patient             Patient will benefit from skilled therapeutic intervention in order to improve the following deficits and impairments:  Decreased endurance, Decreased safety awareness, Difficulty walking, Decreased coordination, Abnormal gait, Improper body mechanics, Pain, Increased muscle spasms, Hypermobility, Hypomobility, Decreased scar mobility, Decreased mobility, Decreased strength, Postural dysfunction  Visit Diagnosis: No diagnosis found.     Problem List Patient Active Problem List   Diagnosis Date Noted   Axillary hidradenitis suppurativa 07/07/2021   Proteus mirabilis infection 01/16/2021   Cutaneous abscess of right axilla 01/13/2021   Iron deficiency anemia 12/10/2020   Prediabetes 08/06/2020   Chronic constipation 08/06/2020   Vitamin D deficiency 08/06/2020   Vaginitis 04/09/2020   Essential hypertension 05/01/2018   Asthma 03/14/2018   Hidradenitis suppurativa 01/18/2018   GAD (generalized anxiety disorder) 10/11/2016   MENOPAUSAL SYNDROME 04/23/2009   OBESITY 02/15/2008   Gastroesophageal reflux disease 04/12/2007    Jerl Mina, PT 07/13/2021, 10:59 AM  [place] [address] [place], [place], [address] Phone: [phone]   Fax:  [phone]  Name: Molly Bean MRN: [id number] Date of  Birth: [birth year]

## 2021-07-15 ENCOUNTER — Ambulatory Visit: Admitting: Internal Medicine

## 2021-07-20 ENCOUNTER — Ambulatory Visit: Payer: BC Managed Care – PPO | Admitting: Physical Therapy

## 2021-07-20 ENCOUNTER — Other Ambulatory Visit: Payer: Self-pay

## 2021-07-20 DIAGNOSIS — R278 Other lack of coordination: Secondary | ICD-10-CM

## 2021-07-20 DIAGNOSIS — R2689 Other abnormalities of gait and mobility: Secondary | ICD-10-CM

## 2021-07-20 DIAGNOSIS — N393 Stress incontinence (female) (male): Secondary | ICD-10-CM

## 2021-07-20 NOTE — Therapy (Addendum)
[place] MAIN [facility] SERVICES [address] [place], [place], [address] Phone: [phone]   Fax:  [phone]  Physical Therapy Treatment  Patient Details  Name: Molly Bean MRN: [id number] Date of Birth: [birth year] Referring Provider (PT): Wannetta Sender MD   Encounter Date: 07/20/2021   PT End of Session - 07/20/21 1107     Visit Number 14    Date for PT Re-Evaluation 10/12/30   PN & recert 3/55/73   PT Start Time 1100    PT Stop Time 1200    PT Time Calculation (min) 60 min    Activity Tolerance Patient tolerated treatment well    Behavior During Therapy [facility] for tasks assessed/performed             Past Medical History:  Diagnosis Date   Anxiety    Constipation    DEPRESSION    edema lower extremeties    GERD    Joint pain    MENOPAUSAL SYNDROME    Multiple food allergies    OBESITY    Shortness of breath    Sleep apnea    wears CPAP    Past Surgical History:  Procedure Laterality Date   ABDOMINAL HYSTERECTOMY  11/1998   total fibroids   CESAREAN SECTION      There were no vitals filed for this visit.   Subjective Assessment - 07/20/21 1106     Subjective Pt reported no problems with her exercises. Pt's urogynecologist changed her OAB medication.    Pertinent History C-section, abdominal hysterectomy.    Patient Stated Goals decreaase leakage, have sex and get through pelvic exams without pain,                [facility] PT Assessment - 07/20/21 1208       Palpation   Palpation comment feet tightness at plantar/ transverse head of adductor hallicus, hypomobile talocrural joint B--                        Pelvic Floor Special Questions - 07/20/21 1211     External Perineal Exam C section scar restrictions tighter on R > L, suprapubic area fascial restrictionfe               [facility] Adult PT Treatment/Exercise - 07/20/21 1206       Therapeutic Activites    Other Therapeutic  Activities guided self-feet massage      Manual Therapy   Internal Pelvic Floor STM/MWM at feet to promote toe abduction and plantar mobility,  fascial mobilizations over R C section scar                          PT Long Term Goals - 06/29/21 0915       PT LONG TERM GOAL #1   Title Pt will demo increased hip abduction strength from 3/5 to >4/5 in order to maintain walking routine and pelvic stability    Time 8    Period Weeks    Status On-going      PT LONG TERM GOAL #2   Title Pt will demo proper deepcore coordination and pelvic floor movement with no cues to progress to kegel exercises    Time 4    Period Weeks    Status Achieved      PT LONG TERM GOAL #3   Title Pt will demo decreased abdominal scar restrictions to advance to deep core exercises  and promote IAP for continence and bowel movement elimination with less dependence on Linzess    Time 4    Period Weeks    Status Partially Met      PT LONG TERM GOAL #4   Title Pt will increase FOTO score for constipation from 41 pts and Urinary 47 pts to > 55 pts in order to demo improved pelvic floor function  ( 06/15/21: no change urinary, 2 pt change improvement  with bowel function )    Time 10    Period Weeks    Status On-going      PT LONG TERM GOAL #5   Title Pt will demo decreased pelvic floor mm tightness and proper relaxation of mm to have less pain with intercourcse and gynecological exams    Time 10    Period Weeks    Status Partially Met                   Plan - 07/20/21 1108     Clinical Impression Statement Pt required further manual Tx to minimize Csection scar restrictions R left. Technique was modified when pt expressed tenderness/pain/ appeared to wince.  Different position/ lighter technique was applied and pt tolerated better with report of no pain. Pt achieved more upward mobility of low abdominal wall post Tx and reported feeling looser in the low abdominal area. Further addressed  tight feet and applied manual Tx to promote toe abduction and plantar mobility as pt reported feet swelling after walking. Plan to address lower kinetic chain at next session to help pt continue to walk for exercise and minimize tight feet which can help minimize  tight pelvic floor.  Pt continues to benefit from skilled PT.    Examination-Activity Limitations Toileting;Continence;Other    Stability/Clinical Decision Making Evolving/Moderate complexity    Rehab Potential Good    PT Frequency 1x / week    PT Duration Other (comment)   10   PT Treatment/Interventions Neuromuscular re-education;Cryotherapy;ADLs/Self Care Home Management;Therapeutic activities;Functional mobility training;Traction;Moist Heat;Stair training;Gait training;Patient/family education;Taping;Manual techniques;Scar mobilization;Balance training;Therapeutic exercise;Spinal Manipulations;Joint Manipulations;Energy conservation    Consulted and Agree with Plan of Care Patient             Patient will benefit from skilled therapeutic intervention in order to improve the following deficits and impairments:  Decreased endurance, Decreased safety awareness, Difficulty walking, Decreased coordination, Abnormal gait, Improper body mechanics, Pain, Increased muscle spasms, Hypermobility, Hypomobility, Decreased scar mobility, Decreased mobility, Decreased strength, Postural dysfunction  Visit Diagnosis: Stress incontinence of urine  Other abnormalities of gait and mobility  Other lack of coordination     Problem List Patient Active Problem List   Diagnosis Date Noted   Axillary hidradenitis suppurativa 07/07/2021   Proteus mirabilis infection 01/16/2021   Cutaneous abscess of right axilla 01/13/2021   Iron deficiency anemia 12/10/2020   Prediabetes 08/06/2020   Chronic constipation 08/06/2020   Vitamin D deficiency 08/06/2020   Vaginitis 04/09/2020   Essential hypertension 05/01/2018   Asthma 03/14/2018    Hidradenitis suppurativa 01/18/2018   GAD (generalized anxiety disorder) 10/11/2016   MENOPAUSAL SYNDROME 04/23/2009   OBESITY 02/15/2008   Gastroesophageal reflux disease 04/12/2007    Jerl Mina, PT 07/20/2021, 12:11 PM  [place] MAIN [facility] SERVICES [address] [place], [place], [address] Phone: [phone]   Fax:  [phone]  Name: Sheilah Rayos MRN: [id number] Date of Birth: 1961/03/09

## 2021-07-20 NOTE — Patient Instructions (Signed)
Feet care :  Self -feet massage   Handshake : fingers between toes, moving ballmounds/toes back and forth several times while other hand anchors at arch. Do the same at the hind/mid foot.  Heel to toes upward to a letter Big Letter T strokes to spread ballmounds and toes, several times, pinch between webs of toes  Run finger tips along top of foot between long bones "comb between the bones"    Wiggle toes and spread them out when relaxing

## 2021-07-27 ENCOUNTER — Ambulatory Visit (INDEPENDENT_AMBULATORY_CARE_PROVIDER_SITE_OTHER): Payer: BC Managed Care – PPO | Admitting: Family Medicine

## 2021-07-27 ENCOUNTER — Ambulatory Visit: Payer: BC Managed Care – PPO | Admitting: Physical Therapy

## 2021-07-29 ENCOUNTER — Other Ambulatory Visit (INDEPENDENT_AMBULATORY_CARE_PROVIDER_SITE_OTHER): Payer: Self-pay | Admitting: Family Medicine

## 2021-07-29 DIAGNOSIS — R7303 Prediabetes: Secondary | ICD-10-CM

## 2021-07-29 NOTE — Telephone Encounter (Signed)
Dr.Opalski ?

## 2021-08-03 ENCOUNTER — Encounter (INDEPENDENT_AMBULATORY_CARE_PROVIDER_SITE_OTHER): Payer: Self-pay | Admitting: Family Medicine

## 2021-08-03 ENCOUNTER — Ambulatory Visit (INDEPENDENT_AMBULATORY_CARE_PROVIDER_SITE_OTHER): Payer: BC Managed Care – PPO | Admitting: Family Medicine

## 2021-08-03 ENCOUNTER — Ambulatory Visit: Payer: BC Managed Care – PPO | Admitting: Physical Therapy

## 2021-08-03 ENCOUNTER — Other Ambulatory Visit: Payer: Self-pay

## 2021-08-03 VITALS — BP 115/78 | HR 72 | Temp 98.2°F | Ht 62.0 in | Wt 218.0 lb

## 2021-08-03 DIAGNOSIS — F39 Unspecified mood [affective] disorder: Secondary | ICD-10-CM

## 2021-08-03 DIAGNOSIS — N393 Stress incontinence (female) (male): Secondary | ICD-10-CM

## 2021-08-03 DIAGNOSIS — Z9189 Other specified personal risk factors, not elsewhere classified: Secondary | ICD-10-CM

## 2021-08-03 DIAGNOSIS — R7303 Prediabetes: Secondary | ICD-10-CM | POA: Diagnosis not present

## 2021-08-03 DIAGNOSIS — K5909 Other constipation: Secondary | ICD-10-CM

## 2021-08-03 DIAGNOSIS — E559 Vitamin D deficiency, unspecified: Secondary | ICD-10-CM

## 2021-08-03 DIAGNOSIS — R2689 Other abnormalities of gait and mobility: Secondary | ICD-10-CM

## 2021-08-03 DIAGNOSIS — E7849 Other hyperlipidemia: Secondary | ICD-10-CM | POA: Diagnosis not present

## 2021-08-03 DIAGNOSIS — Z6841 Body Mass Index (BMI) 40.0 and over, adult: Secondary | ICD-10-CM

## 2021-08-03 DIAGNOSIS — R278 Other lack of coordination: Secondary | ICD-10-CM

## 2021-08-03 MED ORDER — BUPROPION HCL ER (SR) 150 MG PO TB12: 150.0000 mg | ORAL_TABLET | Freq: Every morning | ORAL | 0 refills | Status: DC

## 2021-08-03 MED ORDER — ROSUVASTATIN CALCIUM 10 MG PO TABS: 10.0000 mg | ORAL_TABLET | Freq: Every day | ORAL | 0 refills | Status: DC

## 2021-08-03 MED ORDER — METFORMIN HCL 500 MG PO TABS: 500.0000 mg | ORAL_TABLET | Freq: Two times a day (BID) | ORAL | 0 refills | Status: DC

## 2021-08-03 MED ORDER — VITAMIN D (ERGOCALCIFEROL) 1.25 MG (50000 UNIT) PO CAPS: ORAL_CAPSULE | ORAL | 0 refills | Status: DC

## 2021-08-03 MED ORDER — SEMAGLUTIDE (1 MG/DOSE) 4 MG/3ML ~~LOC~~ SOPN: 1.0000 mg | PEN_INJECTOR | SUBCUTANEOUS | 0 refills | Status: DC

## 2021-08-03 NOTE — Therapy (Signed)
[place] MAIN [facility] SERVICES [address] [place], [place], [address] Phone: [phone]   Fax:  [phone]  Physical Therapy Treatment  Patient Details  Name: Molly Bean MRN: [id number] Date of Birth: 03-08-1961 Referring Provider (PT): Wannetta Sender MD   Encounter Date: 08/03/2021   PT End of Session - 08/03/21 0907     Visit Number 15    Date for PT Re-Evaluation 07/20/50   PN & recert 0/25/85   PT Start Time 0900    PT Stop Time 1000    PT Time Calculation (min) 60 min    Activity Tolerance Patient tolerated treatment well    Behavior During Therapy [facility] for tasks assessed/performed             Past Medical History:  Diagnosis Date   Anxiety    Constipation    DEPRESSION    edema lower extremeties    GERD    Joint pain    MENOPAUSAL SYNDROME    Multiple food allergies    OBESITY    Shortness of breath    Sleep apnea    wears CPAP    Past Surgical History:  Procedure Laterality Date   ABDOMINAL HYSTERECTOMY  11/1998   total fibroids   CESAREAN SECTION      There were no vitals filed for this visit.   Subjective Assessment - 08/03/21 0908     Subjective Pt has started OAB medication a few weeks ago. Pt has started to notice the urge to go and do not the bladder is getting emptied. Pt reports she is not feeling well in her stomach. She thinks it has to do with Metformin medicaiton. Pt plans to her PCP to talk about this medication.  Pt reports she wore a catheter in 2000 after her bladder was slit when her fibroid tumor was being removed during a hysterectomy. Pt wore it for 1.5 months. Sensation to have the urge to use the bathroom returned. Pt experiences pain with intercourse at the beginning of insertion and with deep penetration. Pt holds her breath with anticipation of pain and bears through it.    Pertinent History C-section, abdominal hysterectomy.    Patient Stated Goals decreaase leakage, have sex and get  through pelvic exams without pain,                [facility] PT Assessment - 08/03/21 0913       Observation/Other Assessments   Observations good upright posture , not slumped, longer stride length in gait       Palpation   Palpation comment                           [facility] Adult PT Treatment/Exercise - 08/03/21 1536       Therapeutic Activites    Other Therapeutic Activities active listening to pt as pt shared changes in her marriage that impacted her response to sexual activity, explaining pain with sexual intercourse, provided empathy and referral to sex therapist, reviewed pt's HEP to continue next weeks                          PT Long Term Goals - 08/03/21 0913       PT LONG TERM GOAL #1   Title Pt will demo increased hip abduction strength from 3/5 to >4/5 in order to maintain walking routine and pelvic stability    Time  8    Period Weeks    Status On-going      PT LONG TERM GOAL #2   Title Pt will demo proper deepcore coordination and pelvic floor movement with no cues to progress to kegel exercises    Time 4    Period Weeks    Status Achieved      PT LONG TERM GOAL #3   Title Pt will demo decreased abdominal scar restrictions to advance to deep core exercises and promote IAP for continence and bowel movement elimination with less dependence on Linzess    Time 4    Period Weeks    Status Partially Met      PT LONG TERM GOAL #4   Title Pt will increase FOTO score for constipation from 41 pts and Urinary 47 pts to > 55 pts in order to demo improved pelvic floor function  ( 06/15/21: no change urinary, 2 pt change improvement  with bowel function )    Time 10    Period Weeks    Status On-going      PT LONG TERM GOAL #5   Title Pt will demo decreased pelvic floor mm tightness and proper relaxation of mm to have less pain with intercourcse and gynecological exams    Time 10    Period Weeks    Status Partially Met                    Plan - 08/03/21 1341     Clinical Impression Statement Focused today on biopsychosocial approaches to pelvic floor rehab, addressing one of the four function of pelvic floor related to sexual function.   Provided active listening to pt as pt shared changes in her marriage that impacted her response to sexual activity, explaining pain with sexual intercourse, provided empathy and referral to sex therapist, reviewed pt's HEP and to continue with pelvic floor kegel strengthening HEP.    Provided explanation to sexual response cycle, anatomy/ physiology and function of pelvic floor.   Lower kinetic chain deficits that were addressed last session helped with improved gait and co-activation of feet mm which will help minimize overactivity of pelvic floor.   Plan to reassess pelvic floor at next session. Pt continues to benefit from skilled PT.    Examination-Activity Limitations Toileting;Continence;Other    Stability/Clinical Decision Making Evolving/Moderate complexity    Rehab Potential Good    PT Frequency 1x / week    PT Duration Other (comment)   10   PT Treatment/Interventions Neuromuscular re-education;Cryotherapy;ADLs/Self Care Home Management;Therapeutic activities;Functional mobility training;Traction;Moist Heat;Stair training;Gait training;Patient/family education;Taping;Manual techniques;Scar mobilization;Balance training;Therapeutic exercise;Spinal Manipulations;Joint Manipulations;Energy conservation    Consulted and Agree with Plan of Care Patient             Patient will benefit from skilled therapeutic intervention in order to improve the following deficits and impairments:  Decreased endurance, Decreased safety awareness, Difficulty walking, Decreased coordination, Abnormal gait, Improper body mechanics, Pain, Increased muscle spasms, Hypermobility, Hypomobility, Decreased scar mobility, Decreased mobility, Decreased strength, Postural dysfunction  Visit  Diagnosis: Stress incontinence of urine  Other abnormalities of gait and mobility  Other lack of coordination     Problem List Patient Active Problem List   Diagnosis Date Noted   Axillary hidradenitis suppurativa 07/07/2021   Proteus mirabilis infection 01/16/2021   Cutaneous abscess of right axilla 01/13/2021   Iron deficiency anemia 12/10/2020   Prediabetes 08/06/2020   Chronic constipation 08/06/2020   Vitamin D deficiency 08/06/2020   Vaginitis  04/09/2020   Essential hypertension 05/01/2018   Asthma 03/14/2018   Hidradenitis suppurativa 01/18/2018   GAD (generalized anxiety disorder) 10/11/2016   MENOPAUSAL SYNDROME 04/23/2009   OBESITY 02/15/2008   Gastroesophageal reflux disease 04/12/2007    Jerl Mina, PT 08/03/2021, 3:39 PM  [place] MAIN [facility] SERVICES [address] [place], [place], [address] Phone: [phone]   Fax:  [phone]  Name: Molly Bean MRN: [id number] Date of Birth: [birth year]

## 2021-08-04 NOTE — Progress Notes (Signed)
Chief Complaint:   OBESITY Molly Bean is here to discuss her progress with her obesity treatment plan along with follow-up of her obesity related diagnoses. Molly Bean is on the Category 1 Plan and states she is following her eating plan approximately 90% of the time. Molly Bean states she is walking 30 minutes 6 times per week.  Today's visit was #: 21 Starting weight: 240 lbs Starting date: 04/15/2020 Today's weight: 218 lbs Today's date: 08/03/2021 Total lbs lost to date: 22 Total lbs lost since last in-office visit: 5  Interim History: Molly Bean is back on meal plan 90% of the time, exercising almost every day, and getting her water in. She is doing so well. Her last OV, she lost 2 lbs and the time prior, she lost 6 lbs with exercise and can even climb stairs. Here to review labs.  Subjective:   1. Other hyperlipidemia Discussed labs with patient today. Very little change in cholesterol panel. Pt denies having a diet high in saturated and trans fats. She has been losing weight and exercising more. Medication compliance is excellent.  2. Pre-diabetes Discussed labs with patient today. Improved A1c and stable CMP. Molly Bean is tolerating increased dose of Ozempic 1 mg well with no side effects.  3. Vitamin D deficiency She is currently taking prescription vitamin D 50,000 IU twice a week. She denies nausea, vomiting or muscle weakness.  Lab Results  Component Value Date   VD25OH 47.7 05/20/2021   VD25OH 46.9 12/30/2020   VD25OH 24.3 (L) 08/19/2020   4. 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 Molly Bean is struggling with emotional eating and using food for comfort to the extent that it is negatively impacting her health. She has been working on behavior modification techniques to help reduce her emotional eating. She shows no sign of suicidal or homicidal ideations.  5. Other constipation Molly Bean is taking Linzess 4 days a week with no issues. She is trying to  increase her water intake and activity levels.  6. At risk for heart disease Molly Bean is at a higher than average risk for cardiovascular disease due to medical conditions and family history.   Assessment/Plan:  No orders of the defined types were placed in this encounter.   Medications Discontinued During This Encounter  Medication Reason   metFORMIN (GLUCOPHAGE) 500 MG tablet    rosuvastatin (CRESTOR) 5 MG tablet    Vitamin D, Ergocalciferol, (DRISDOL) 1.25 MG (50000 UNIT) CAPS capsule Reorder   buPROPion (WELLBUTRIN SR) 150 MG 12 hr tablet Reorder   metFORMIN (GLUCOPHAGE) 500 MG tablet Reorder   Semaglutide, 1 MG/DOSE, 4 MG/3ML SOPN Reorder     Meds ordered this encounter  Medications   buPROPion (WELLBUTRIN SR) 150 MG 12 hr tablet    Sig: Take 1 tablet (150 mg total) by mouth in the morning.    Dispense:  30 tablet    Refill:  0    Ov for rf   metFORMIN (GLUCOPHAGE) 500 MG tablet    Sig: Take 1 tablet (500 mg total) by mouth 2 (two) times daily with a meal.    Dispense:  60 tablet    Refill:  0    Ov for rf   Semaglutide, 1 MG/DOSE, 4 MG/3ML SOPN    Sig: Inject 1 mg as directed once a week.    Dispense:  3 mL    Refill:  0    One mo supply; ov for rf   Vitamin D, Ergocalciferol, (DRISDOL) 1.25 MG (50000 UNIT)  CAPS capsule    Sig: 1 po q wed, and 1 po q sun    Dispense:  8 capsule    Refill:  0    Ov for rf   rosuvastatin (CRESTOR) 10 MG tablet    Sig: Take 1 tablet (10 mg total) by mouth at bedtime.    Dispense:  30 tablet    Refill:  0    30 d supply;  ** OV for RF **   Do not send RF request     1. Other hyperlipidemia I recommend pt increase dose of Crestor from 5 mg to 10 mg. Refill Crestor with increased dose of 10 mg qhs.. Pt advised to notify cardiology regarding change in dose. Recheck labs in 6-8 weeks.  Increase & Refill- rosuvastatin (CRESTOR) 10 MG tablet; Take 1 tablet (10 mg total) by mouth at bedtime.  Dispense: 30 tablet; Refill: 0  2.  Pre-diabetes Molly Bean will continue to work on weight loss via prudent nutritional plan, exercise, and decreasing simple carbohydrates to help decrease the risk of diabetes.   Refill- metFORMIN (GLUCOPHAGE) 500 MG tablet; Take 1 tablet (500 mg total) by mouth 2 (two) times daily with a meal.  Dispense: 60 tablet; Refill: 0 Refill- Semaglutide, 1 MG/DOSE, 4 MG/3ML SOPN; Inject 1 mg as directed once a week.  Dispense: 3 mL; Refill: 0  3. Vitamin D deficiency Low Vitamin D level contributes to fatigue and are associated with obesity, breast, and colon cancer. She agrees to continue to take prescription Vitamin D 50,000 IU twice a week and will follow-up for routine testing of Vitamin D, at least 2-3 times per year to avoid over-replacement.  Refill- Vitamin D, Ergocalciferol, (DRISDOL) 1.25 MG (50000 UNIT) CAPS capsule; 1 po q wed, and 1 po q sun  Dispense: 8 capsule; Refill: 0  4. 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  Orders and follow up as documented in patient record.   Refill- buPROPion (WELLBUTRIN SR) 150 MG 12 hr tablet; Take 1 tablet (150 mg total) by mouth in the morning.  Dispense: 30 tablet; Refill: 0  5. Other constipation Molly Bean was informed that a decrease in bowel movement frequency is normal while losing weight, but stools should not be hard or painful. Orders and follow up as documented in patient record.   Refill Linzess.  Counseling Getting to Good Bowel Health: Your goal is to have one soft bowel movement each day. Drink at least 8 glasses of water each day. Eat plenty of fiber (goal is over 25 grams each day). It is best to get most of your fiber from dietary sources which includes leafy green vegetables, fresh fruit, and whole grains. You may need to add fiber with the help of OTC fiber supplements. These include Metamucil, Citrucel, and Flaxseed. If  you are still having trouble, try adding Miralax or Magnesium Citrate. If all of these changes do not work, Cabin crew.  6. At risk for heart disease Molly Bean was given approximately 22 minutes of coronary artery disease prevention counseling today. She is 60 y.o. female and has risk factors for heart disease including obesity. We discussed intensive lifestyle modifications today with an emphasis on specific weight loss instructions and strategies.   Repetitive spaced learning was employed today to elicit superior memory formation and behavioral change.  7. Class 3 severe obesity with serious comorbidity and body mass index (BMI) of 40.0 to 44.9  in adult, unspecified obesity type ([place])  Liana is currently in the action stage of change. As such, her goal is to continue with weight loss efforts. She has agreed to the Category 1 Plan.   Exercise goals:  As is  Behavioral modification strategies: planning for success.  Damyah has agreed to follow-up with our clinic in 3 weeks. She was informed of the importance of frequent follow-up visits to maximize her success with intensive lifestyle modifications for her multiple health conditions.   Objective:   Blood pressure 115/78, pulse 72, temperature 98.2 F (36.8 C), height 5\' 2"  (1.575 m), weight 218 lb (98.9 kg), SpO2 98 %. Body mass index is 39.87 kg/m.  General: Cooperative, alert, well developed, in no acute distress. HEENT: Conjunctivae and lids unremarkable. Cardiovascular: Regular rhythm.  Lungs: Normal work of breathing. Neurologic: No focal deficits.   Lab Results  Component Value Date   CREATININE 0.78 07/01/2021   BUN 10 07/01/2021   NA 143 07/01/2021   K 4.3 07/01/2021   CL 104 07/01/2021   CO2 26 07/01/2021   Lab Results  Component Value Date   ALT 15 07/01/2021   AST 16 07/01/2021   ALKPHOS 74 07/01/2021   BILITOT 0.2 07/01/2021   Lab Results  Component Value Date   HGBA1C 5.9 (H) 07/01/2021    HGBA1C 6.0 (H) 03/23/2021   HGBA1C 6.2 (H) 12/30/2020   HGBA1C 6.1 (H) 08/19/2020   HGBA1C 6.1 (H) 04/15/2020   Lab Results  Component Value Date   INSULIN 21.1 03/23/2021   INSULIN 21.9 04/15/2020   Lab Results  Component Value Date   TSH 1.100 04/15/2020   Lab Results  Component Value Date   CHOL 169 07/01/2021   HDL 58 07/01/2021   LDLCALC 99 07/01/2021   LDLDIRECT 95 12/30/2020   TRIG 63 07/01/2021   CHOLHDL 2.9 07/01/2021   Lab Results  Component Value Date   VD25OH 47.7 05/20/2021   VD25OH 46.9 12/30/2020   VD25OH 24.3 (L) 08/19/2020   Lab Results  Component Value Date   WBC 9.2 12/30/2020   HGB 12.6 12/30/2020   HCT 40.6 12/30/2020   MCV 69 (L) 12/30/2020   PLT 220 12/30/2020   Lab Results  Component Value Date   IRON 51 12/30/2020   TIBC 300 12/30/2020   FERRITIN 55 12/30/2020    Attestation Statements:   Reviewed by clinician on day of visit: allergies, medications, problem list, medical history, surgical history, family history, social history, and previous encounter notes.  Coral Ceo, CMA, am acting as transcriptionist for [employer], DO.  I have reviewed the above documentation for accuracy and completeness, and I agree with the above. Marjory Sneddon, D.O.  The [place] was signed into law in 2016 which includes the topic of electronic health records.  This provides immediate access to information in MyChart.  This includes consultation notes, operative notes, office notes, lab results and pathology reports.  If you have any questions about what you read please let us know at your next visit so we can discuss your concerns and take corrective action if need be.  We are right here with you.

## 2021-08-24 ENCOUNTER — Ambulatory Visit (INDEPENDENT_AMBULATORY_CARE_PROVIDER_SITE_OTHER): Payer: BC Managed Care – PPO | Admitting: Family Medicine

## 2021-08-24 ENCOUNTER — Other Ambulatory Visit: Payer: Self-pay

## 2021-08-24 ENCOUNTER — Other Ambulatory Visit (INDEPENDENT_AMBULATORY_CARE_PROVIDER_SITE_OTHER): Payer: Self-pay | Admitting: Family Medicine

## 2021-08-24 ENCOUNTER — Encounter (INDEPENDENT_AMBULATORY_CARE_PROVIDER_SITE_OTHER): Payer: Self-pay | Admitting: Family Medicine

## 2021-08-24 VITALS — BP 136/72 | HR 63 | Temp 97.8°F | Ht 62.0 in | Wt 217.0 lb

## 2021-08-24 DIAGNOSIS — Z9189 Other specified personal risk factors, not elsewhere classified: Secondary | ICD-10-CM | POA: Diagnosis not present

## 2021-08-24 DIAGNOSIS — K5909 Other constipation: Secondary | ICD-10-CM

## 2021-08-24 DIAGNOSIS — E559 Vitamin D deficiency, unspecified: Secondary | ICD-10-CM

## 2021-08-24 DIAGNOSIS — F39 Unspecified mood [affective] disorder: Secondary | ICD-10-CM

## 2021-08-24 DIAGNOSIS — R7303 Prediabetes: Secondary | ICD-10-CM

## 2021-08-24 DIAGNOSIS — Z6841 Body Mass Index (BMI) 40.0 and over, adult: Secondary | ICD-10-CM

## 2021-08-24 MED ORDER — BUPROPION HCL ER (SR) 150 MG PO TB12: 150.0000 mg | ORAL_TABLET | Freq: Every morning | ORAL | 0 refills | Status: DC

## 2021-08-24 MED ORDER — LINACLOTIDE 145 MCG PO CAPS: 145.0000 ug | ORAL_CAPSULE | Freq: Every day | ORAL | 0 refills | Status: DC

## 2021-08-24 MED ORDER — VITAMIN D (ERGOCALCIFEROL) 1.25 MG (50000 UNIT) PO CAPS: ORAL_CAPSULE | ORAL | 0 refills | Status: DC

## 2021-08-24 MED ORDER — METFORMIN HCL 500 MG PO TABS: 500.0000 mg | ORAL_TABLET | Freq: Two times a day (BID) | ORAL | 0 refills | Status: DC

## 2021-08-24 MED ORDER — SEMAGLUTIDE (1 MG/DOSE) 4 MG/3ML ~~LOC~~ SOPN: 1.0000 mg | PEN_INJECTOR | SUBCUTANEOUS | 0 refills | Status: DC

## 2021-08-24 NOTE — Progress Notes (Signed)
Chief Complaint:   OBESITY Molly Bean is here to discuss her progress with her obesity treatment plan along with follow-up of her obesity related diagnoses. Molly Bean is on the Category 1 Plan and states she is following her eating plan approximately 90% of the time. Molly Bean states she is walking on the treadmill for 30 minutes 6 times per week.  Today's visit was #: 22 Starting weight: 240 lbs Starting date: 04/15/2020 Today's weight: 217 lbs Today's date: 08/24/2021 Total lbs lost to date: 23 Total lbs lost since last in-office visit: 1  Interim History: Molly Bean is here for a follow up office visit. We reviewed her meal plan and questions were answered. Patient's food recall appears to be accurate and consistent with what is on plan when she is following it. When eating on plan, her hunger and cravings are well controlled. Molly Bean has gained 3 lbs in muscle and lost almost 4 lbs in adipose, and she is still exercising and proud of her progress.   Subjective:   1. Pre-diabetes Molly Bean has a diagnosis of pre-diabetes based on her elevated HgA1c and was informed this puts her at greater risk of developing diabetes. She continues to work on diet and exercise to decrease her risk of diabetes. She denies nausea or hypoglycemia.  2. Vitamin D deficiency Molly Bean is currently taking prescription vitamin D 50,000 IU twice weekly. She denies nausea, vomiting or muscle weakness.  3. Other constipation Molly Bean notes constipation. Gastrointestinal ROS: no abdominal pain, change in bowel habits, or black or bloody stools.   4. 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 She has been working on behavior modification techniques to help reduce her emotional eating and has been somewhat successful. She shows no sign of suicidal or homicidal ideations.  5. At risk  for dehydration Molly Bean is at risk for dehydration due to inadequate water intake.  Assessment/Plan:  No orders of the defined types were placed in this encounter.   Medications Discontinued During This Encounter  Medication Reason   doxycycline (VIBRA-TABS) 100 MG tablet    linaclotide (LINZESS) 145 MCG CAPS capsule Reorder   buPROPion (WELLBUTRIN SR) 150 MG 12 hr tablet Reorder   metFORMIN (GLUCOPHAGE) 500 MG tablet Reorder   Semaglutide, 1 MG/DOSE, 4 MG/3ML SOPN Reorder   Vitamin D, Ergocalciferol, (DRISDOL) 1.25 MG (50000 UNIT) CAPS capsule Reorder     Meds ordered this encounter  Medications   buPROPion (WELLBUTRIN SR) 150 MG 12 hr tablet    Sig: Take 1 tablet (150 mg total) by mouth in the morning.    Dispense:  30 tablet    Refill:  0    Ov for rf   metFORMIN (GLUCOPHAGE) 500 MG tablet    Sig: Take 1 tablet (500 mg total) by mouth 2 (two) times daily with a meal.    Dispense:  60 tablet    Refill:  0    Ov for rf   linaclotide (LINZESS) 145 MCG CAPS capsule    Sig: Take 1 capsule (145 mcg total) by mouth daily before breakfast.    Dispense:  30 capsule    Refill:  0   Semaglutide, 1 MG/DOSE, 4 MG/3ML SOPN    Sig: Inject 1 mg as directed once a week.    Dispense:  3 mL    Refill:  0    One mo supply; ov for rf  Vitamin D, Ergocalciferol, (DRISDOL) 1.25 MG (50000 UNIT) CAPS capsule    Sig: 1 po q wed, and 1 po q sun    Dispense:  8 capsule    Refill:  0    Ov for rf     1. Pre-diabetes Molly Bean will continue to work on weight loss, exercise, and decreasing simple carbohydrates to help decrease the risk of diabetes. We will refill metformin and Ozempic at the same doses for 1 month.  - metFORMIN (GLUCOPHAGE) 500 MG tablet; Take 1 tablet (500 mg total) by mouth 2 (two) times daily with a meal.  Dispense: 60 tablet; Refill: 0 - Semaglutide, 1 MG/DOSE, 4 MG/3ML SOPN; Inject 1 mg as directed once a week.  Dispense: 3 mL; Refill: 0  2. Vitamin D deficiency Low  Vitamin D level contributes to fatigue and are associated with obesity, breast, and colon cancer. We will refill prescription Vitamin D for 1 month. Molly Bean will follow-up for routine testing of Vitamin D, at least 2-3 times per year to avoid over-replacement.  - Vitamin D, Ergocalciferol, (DRISDOL) 1.25 MG (50000 UNIT) CAPS capsule; 1 po q wed, and 1 po q sun  Dispense: 8 capsule; Refill: 0  3. Other constipation Molly Bean was informed that a decrease in bowel movement frequency is normal while losing weight, but stools should not be hard or painful. We will refill Linzess for 1 month. Orders and follow up as documented in patient record.   Counseling Getting to Good Bowel Health: Your goal is to have one soft bowel movement each day. Drink at least 8 glasses of water each day. Eat plenty of fiber (goal is over 25 grams each day). It is best to get most of your fiber from dietary sources which includes leafy green vegetables, fresh fruit, and whole grains. You may need to add fiber with the help of OTC fiber supplements. These include Metamucil, Citrucel, and Flaxseed. If you are still having trouble, try adding Miralax or Magnesium Citrate. If all of these changes do not work, Cabin crew.  - linaclotide (LINZESS) 145 MCG CAPS capsule; Take 1 capsule (145 mcg total) by mouth daily before breakfast.  Dispense: 30 capsule; Refill: 0  4. 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 We will refill Wellbutrin SR for 1 month. Orders and follow up as documented in patient record.   - buPROPion (WELLBUTRIN SR) 150 MG 12 hr tablet; Take 1 tablet (150 mg total) by mouth in the morning.  Dispense: 30 tablet; Refill: 0  5. At risk for dehydration Augusta was given approximately 15 minutes dehydration prevention counseling today. Chrisa is at risk for dehydration due to weight  loss and current medication(s). She was encouraged to hydrate and monitor fluid status to avoid dehydration as well as weight loss plateaus.    6. Obesity BMI today is 41 Kairah is currently in the action stage of change. As such, her goal is to continue with weight loss efforts. She has agreed to the Category 1 Plan.   Thanksgiving day strategy handout was given after discussion.  Exercise goals: As is.  Behavioral modification strategies: holiday eating strategies  and planning for success.  Molly Bean has agreed to follow-up with our clinic in 2 to 3 weeks. She was informed of the importance of frequent follow-up visits to maximize her success with intensive lifestyle modifications for her multiple health conditions.   Objective:   Blood pressure  136/72, pulse 63, temperature 97.8 F (36.6 C), height 5\' 2"  (1.575 m), weight 217 lb (98.4 kg), SpO2 98 %. Body mass index is 39.69 kg/m.  General: Cooperative, alert, well developed, in no acute distress. HEENT: Conjunctivae and lids unremarkable. Cardiovascular: Regular rhythm.  Lungs: Normal work of breathing. Neurologic: No focal deficits.   Lab Results  Component Value Date   CREATININE 0.78 07/01/2021   BUN 10 07/01/2021   NA 143 07/01/2021   K 4.3 07/01/2021   CL 104 07/01/2021   CO2 26 07/01/2021   Lab Results  Component Value Date   ALT 15 07/01/2021   AST 16 07/01/2021   ALKPHOS 74 07/01/2021   BILITOT 0.2 07/01/2021   Lab Results  Component Value Date   HGBA1C 5.9 (H) 07/01/2021   HGBA1C 6.0 (H) 03/23/2021   HGBA1C 6.2 (H) 12/30/2020   HGBA1C 6.1 (H) 08/19/2020   HGBA1C 6.1 (H) 04/15/2020   Lab Results  Component Value Date   INSULIN 21.1 03/23/2021   INSULIN 21.9 04/15/2020   Lab Results  Component Value Date   TSH 1.100 04/15/2020   Lab Results  Component Value Date   CHOL 169 07/01/2021   HDL 58 07/01/2021   LDLCALC 99 07/01/2021   LDLDIRECT 95 12/30/2020   TRIG 63 07/01/2021   CHOLHDL 2.9  07/01/2021   Lab Results  Component Value Date   VD25OH 47.7 05/20/2021   VD25OH 46.9 12/30/2020   VD25OH 24.3 (L) 08/19/2020   Lab Results  Component Value Date   WBC 9.2 12/30/2020   HGB 12.6 12/30/2020   HCT 40.6 12/30/2020   MCV 69 (L) 12/30/2020   PLT 220 12/30/2020   Lab Results  Component Value Date   IRON 51 12/30/2020   TIBC 300 12/30/2020   FERRITIN 55 12/30/2020   Attestation Statements:   Reviewed by clinician on day of visit: allergies, medications, problem list, medical history, surgical history, family history, social history, and previous encounter notes.   Wilhemena Durie, am acting as transcriptionist for [employer], DO.  I have reviewed the above documentation for accuracy and completeness, and I agree with the above. Marjory Sneddon, D.O.  The [place] was signed into law in 2016 which includes the topic of electronic health records.  This provides immediate access to information in MyChart.  This includes consultation notes, operative notes, office notes, lab results and pathology reports.  If you have any questions about what you read please let us know at your next visit so we can discuss your concerns and take corrective action if need be.  We are right here with you.

## 2021-08-25 ENCOUNTER — Encounter: Admitting: Physical Therapy

## 2021-08-26 ENCOUNTER — Other Ambulatory Visit (INDEPENDENT_AMBULATORY_CARE_PROVIDER_SITE_OTHER): Payer: Self-pay | Admitting: Family Medicine

## 2021-08-26 DIAGNOSIS — E7849 Other hyperlipidemia: Secondary | ICD-10-CM

## 2021-08-26 DIAGNOSIS — F39 Unspecified mood [affective] disorder: Secondary | ICD-10-CM

## 2021-08-26 NOTE — Telephone Encounter (Signed)
LAST APPOINTMENT DATE: 08/24/21 NEXT APPOINTMENT DATE: 09/10/21   [id number] - [place], [place] - [address] [place] [address] Phone: [phone] Fax: [phone]  Patient is requesting a refill of the following medications: Requested Prescriptions   Pending Prescriptions Disp Refills   rosuvastatin (CRESTOR) 10 MG tablet [Pharmacy Med Name: ROSUVASTATIN CALCIUM 10 MG TAB] 30 tablet 0    Sig: TAKE 1 TABLET BY MOUTH EVERYDAY AT BEDTIME    Date last filled: 08/03/21 Previously prescribed by Dr Raliegh Scarlet  Lab Results  Component Value Date   HGBA1C 5.9 (H) 07/01/2021   HGBA1C 6.0 (H) 03/23/2021   HGBA1C 6.2 (H) 12/30/2020   Lab Results  Component Value Date   LDLCALC 99 07/01/2021   CREATININE 0.78 07/01/2021   Lab Results  Component Value Date   VD25OH 47.7 05/20/2021   VD25OH 46.9 12/30/2020   VD25OH 24.3 (L) 08/19/2020    BP Readings from Last 3 Encounters:  08/24/21 136/72  08/03/21 115/78  07/13/21 120/81

## 2021-09-02 ENCOUNTER — Encounter: Payer: Self-pay | Admitting: Obstetrics and Gynecology

## 2021-09-02 ENCOUNTER — Other Ambulatory Visit: Payer: Self-pay

## 2021-09-02 ENCOUNTER — Ambulatory Visit (INDEPENDENT_AMBULATORY_CARE_PROVIDER_SITE_OTHER): Payer: BC Managed Care – PPO | Admitting: Obstetrics and Gynecology

## 2021-09-02 VITALS — BP 115/71 | HR 72 | Wt 214.0 lb

## 2021-09-02 DIAGNOSIS — N3281 Overactive bladder: Secondary | ICD-10-CM | POA: Diagnosis not present

## 2021-09-02 MED ORDER — MIRABEGRON ER 25 MG PO TB24: 25.0000 mg | ORAL_TABLET | Freq: Every day | ORAL | 5 refills | Status: AC

## 2021-09-02 NOTE — Progress Notes (Signed)
[facility] Return Visit  SUBJECTIVE  History of Present Illness: Molly Bean is a 60 y.o. female seen in follow-up for mixed incontinence and levator spasm.   Last visit started vesicare after she did not see improvement with trospium. Has been continuing physical therapy. Has only seen a small difference with the medication and it gives her a headache every time she takes it.   Has not been taking flexeril. Has to make sure bladder is empty with intercourse., and still having lots of pain.  Past Medical History: Patient  has a past medical history of Anxiety, Constipation, DEPRESSION, edema lower extremeties, GERD, Joint pain, MENOPAUSAL SYNDROME, Multiple food allergies, OBESITY, Shortness of breath, and Sleep apnea.   Past Surgical History: She  has a past surgical history that includes Cesarean section and Abdominal hysterectomy (11/1998).   Medications: She has a current medication list which includes the following prescription(s): acetaminophen, albuterol, bupropion, iron, linaclotide, metformin, mirabegron er, rosuvastatin, semaglutide (1 mg/dose), triamterene-hydrochlorothiazide, and vitamin d (ergocalciferol).   Allergies: Patient is allergic to pennsaid [diclofenac sodium], oxycodone, and shellfish allergy.   Social History: Patient  reports that she quit smoking about 43 years ago. Her smoking use included cigarettes. She has a 6.00 pack-year smoking history. She quit smokeless tobacco use about 33 years ago. She reports that she does not drink alcohol and does not use drugs.      OBJECTIVE     Physical Exam: Vitals:   09/02/21 0818  BP: 115/71  Pulse: 72  Weight: 214 lb (97.1 kg)     Gen: No apparent distress, A&O x 3.  Detailed Urogynecologic Evaluation:  Deferred.    ASSESSMENT AND PLAN    Molly Bean is a 60 y.o. with:  1. Overactive bladder      - Stop vesicare 5mg  and start myrbetriq 25mg . Will likely need prior authorization,  samples provided.   - Discussed third line therapies but she would like to avoid procedures at this time.  - Continue to work with physical therapy - Discussed taking flexeril prior to intercourse or PT appointments to help with muscle relaxation  Follow up 6 weeks  Jaquita Folds, MD   Time spent: I spent 20 minutes dedicated to the care of this patient on the date of this encounter to include pre-visit review of records, face-to-face time with the patient and post visit documentation and ordering medication/ testing.

## 2021-09-04 ENCOUNTER — Ambulatory Visit (INDEPENDENT_AMBULATORY_CARE_PROVIDER_SITE_OTHER): Payer: BC Managed Care – PPO | Admitting: Internal Medicine

## 2021-09-04 ENCOUNTER — Other Ambulatory Visit: Payer: Self-pay

## 2021-09-04 VITALS — BP 124/72 | HR 86 | Ht 62.0 in | Wt 216.8 lb

## 2021-09-04 DIAGNOSIS — G4733 Obstructive sleep apnea (adult) (pediatric): Secondary | ICD-10-CM

## 2021-09-04 DIAGNOSIS — R0683 Snoring: Secondary | ICD-10-CM | POA: Diagnosis not present

## 2021-09-04 DIAGNOSIS — R0602 Shortness of breath: Secondary | ICD-10-CM | POA: Diagnosis not present

## 2021-09-04 DIAGNOSIS — E7800 Pure hypercholesterolemia, unspecified: Secondary | ICD-10-CM

## 2021-09-04 DIAGNOSIS — Z79899 Other long term (current) drug therapy: Secondary | ICD-10-CM

## 2021-09-04 DIAGNOSIS — E6609 Other obesity due to excess calories: Secondary | ICD-10-CM | POA: Diagnosis not present

## 2021-09-04 DIAGNOSIS — E66812 Obesity, class 2: Secondary | ICD-10-CM

## 2021-09-04 DIAGNOSIS — I10 Essential (primary) hypertension: Secondary | ICD-10-CM

## 2021-09-04 DIAGNOSIS — Z6839 Body mass index (BMI) 39.0-39.9, adult: Secondary | ICD-10-CM

## 2021-09-04 MED ORDER — TRIAMTERENE-HCTZ 37.5-25 MG PO TABS: 1.0000 | ORAL_TABLET | Freq: Every day | ORAL | 3 refills | Status: DC

## 2021-09-04 NOTE — Progress Notes (Signed)
Cardiology Office Note:    Date:  09/04/21  ID:  Molly Bean, DOB August 03, 1961, MRN [id number]  PCP:  Janith Lima, MD  Cardiologist:  Elouise Munroe, MD  Electrophysiologist:  None   Referring MD: Janith Lima, MD   Chief Complaint/Reason for Referral: DOE, HTN  History of Present Illness:    Molly Bean is a 60 y.o. female with a history of anxiety and GERD, who presents for follow-up of shortness of breath.   Doing well overall. Mild SOB, feels like this may coincide with being out of BP med which includes diuretic component. Refill today.   Compliant with CPAP and still deriving great benefit.   Doing well with weight loss, since April has lost almost 30 lb. [facility], is on ozempic and metformin for prediabetes.  The patient denies chest pain, chest pressure, dyspnea at rest or with exertion, palpitations, PND, orthopnea, or leg swelling. Denies cough, fever, chills. Denies nausea, vomiting. Denies syncope or presyncope. Denies dizziness or lightheadedness. Denies snoring.    Past Medical History:  Diagnosis Date   Anxiety    Constipation    DEPRESSION    edema lower extremeties    GERD    Joint pain    MENOPAUSAL SYNDROME    Multiple food allergies    OBESITY    Shortness of breath    Sleep apnea    wears CPAP    Past Surgical History:  Procedure Laterality Date   ABDOMINAL HYSTERECTOMY  11/1998   total fibroids   CESAREAN SECTION      Current Medications: Current Meds  Medication Sig   acetaminophen (TYLENOL) 500 MG tablet Take 1,000 mg by mouth every 6 (six) hours as needed for mild pain or headache.   buPROPion (WELLBUTRIN SR) 150 MG 12 hr tablet Take 1 tablet (150 mg total) by mouth in the morning.   Ferrous Sulfate (IRON) 325 (65 Fe) MG TABS Take 1 tablet by mouth daily at 2 PM.   linaclotide (LINZESS) 145 MCG CAPS capsule Take 1 capsule (145 mcg total) by mouth daily before breakfast.   metFORMIN  (GLUCOPHAGE) 500 MG tablet Take 1 tablet (500 mg total) by mouth 2 (two) times daily with a meal.   mirabegron ER (MYRBETRIQ) 25 MG TB24 tablet Take 1 tablet (25 mg total) by mouth daily.   rosuvastatin (CRESTOR) 10 MG tablet TAKE 1 TABLET BY MOUTH EVERYDAY AT BEDTIME   Semaglutide, 1 MG/DOSE, 4 MG/3ML SOPN Inject 1 mg as directed once a week.   Vitamin D, Ergocalciferol, (DRISDOL) 1.25 MG (50000 UNIT) CAPS capsule 1 po q wed, and 1 po q sun   [DISCONTINUED] triamterene-hydrochlorothiazide (MAXZIDE-25) 37.5-25 MG tablet TAKE 0.5-1 TABLET BY MOUTH DAILY.     Allergies:   Shellfish allergy, Pennsaid [diclofenac sodium], and Oxycodone   Social History   Tobacco Use   Smoking status: Former    Packs/day: 0.30    Years: 20.00    Pack years: 6.00    Types: Cigarettes    Quit date: 10/04/1977    Years since quitting: 43.9   Smokeless tobacco: Former    Quit date: 10/05/1987  Vaping Use   Vaping Use: Never used  Substance Use Topics   Alcohol use: No   Drug use: No     Family History: The patient's family history includes Atrial fibrillation in her mother; Breast cancer (age of onset: 67) in her sister; Coronary artery disease in her mother; Diabetes in her  mother; Hypertension in her mother; Lupus in her cousin; Obesity in her mother; Seizures in her father; Sleep apnea in her mother; Stroke in her father.  ROS:   Please see the history of present illness.    All other systems reviewed and are negative.  EKGs/Labs/Other Studies Reviewed:    The following studies were reviewed today:  EKG: n/a  I have independently reviewed the images from : n/a  Recent Labs: 12/30/2020: Hemoglobin 12.6; Platelets 220 07/01/2021: ALT 15; BUN 10; Creatinine, Ser 0.78; Potassium 4.3; Sodium 143  Recent Lipid Panel    Component Value Date/Time   CHOL 169 07/01/2021 1143   TRIG 63 07/01/2021 1143   HDL 58 07/01/2021 1143   CHOLHDL 2.9 07/01/2021 1143   CHOLHDL 3 11/06/2010 1638   VLDL 20.8  11/06/2010 1638   LDLCALC 99 07/01/2021 1143   LDLDIRECT 95 12/30/2020 1641    Physical Exam:    VS:  BP 124/72   Pulse 86   Ht 5\' 2"  (1.575 m)   Wt 216 lb 12.8 oz (98.3 kg)   SpO2 96%   BMI 39.65 kg/m     Wt Readings from Last 5 Encounters:  09/04/21 216 lb 12.8 oz (98.3 kg)  09/02/21 214 lb (97.1 kg)  08/24/21 217 lb (98.4 kg)  08/03/21 218 lb (98.9 kg)  07/13/21 223 lb (101.2 kg)    Constitutional: No acute distress Eyes: sclera non-icteric, normal conjunctiva and lids ENMT: normal dentition, moist mucous membranes Cardiovascular: regular rhythm, normal rate, no murmurs. S1 and S2 normal. Radial pulses normal bilaterally. No jugular venous distention.  Respiratory: clear to auscultation bilaterally GI : normal bowel sounds, soft and nontender. No distention.   MSK: extremities warm, well perfused. No edema.  NEURO: grossly nonfocal exam, moves all extremities. PSYCH: alert and oriented x 3, normal mood and affect.    ASSESSMENT:    1. SOB (shortness of breath)   2. Essential hypertension   3. Class 2 obesity due to excess calories without serious comorbidity with body mass index (BMI) of 39.0 to 39.9 in adult     PLAN:    Essential hypertension - Plan: EKG 12-Lead - She has been out of triamterene HCTZ since October and fortunately BP is normal. I would like for her to resume this therapy and if BP drops too low (100/60 or less) she can cut the tab in half. Continue 37.5-25 mg daily.   SOB (shortness of breath) -She has had normal ischemic testing with a stress echo in 2019, no worrisome findings on a cardiac monitor with rare PVCs and PACs, recently normal echocardiogram, and improvement in shortness of breath with treatment of sleep apnea.  Very mild recurrence of SOB may be due to being out of diuretic as above. Will refill today.   Class 2 obesity -[facility].  She has started Ozempic with good results.    Snoring -Uses  CPAP, has significant benefit.  Continue.  Hyperlipidemia- LDL is above goal at 99 and she is on crestor 10 mg daily. She will try 6 more months of diet an weight loss and may need to uptitrate at next visit if LDL >70 for best prevention.  Total time of encounter: 30 minutes total time of encounter, including 25 minutes spent in face-to-face patient care on the date of this encounter. This time includes coordination of care and counseling regarding above mentioned problem list. Remainder of non-face-to-face time involved reviewing chart documents/testing relevant to  the patient encounter and documentation in the medical record. I have independently reviewed documentation from referring provider.   Cherlynn Kaiser, MD, [facility]     Medication Adjustments/Labs and Tests Ordered: Current medicines are reviewed at length with the patient today.  Concerns regarding medicines are outlined above.   No orders of the defined types were placed in this encounter.    Meds ordered this encounter  Medications   triamterene-hydrochlorothiazide (MAXZIDE-25) 37.5-25 MG tablet    Sig: Take 1 tablet by mouth daily.    Dispense:  90 tablet    Refill:  3     Patient Instructions  Medication Instructions:  No Changes In Medications at this time.  *If you need a refill on your cardiac medications before your next appointment, please call your pharmacy*  Follow-Up: At [facility], you and your health needs are our priority.  As part of our continuing mission to provide you with exceptional heart care, we have created designated Provider Care Teams.  These Care Teams include your primary Cardiologist (physician) and Advanced Practice Providers (APPs -  Physician Assistants and Nurse Practitioners) who all work together to provide you with the care you need, when you need it.  Your next appointment:   6 month(s)  The format for your next appointment:   In  Person  Provider:   Elouise Munroe, MD

## 2021-09-04 NOTE — Patient Instructions (Signed)
Medication Instructions:  ?No Changes In Medications at this time.  ?*If you need a refill on your cardiac medications before your next appointment, please call your pharmacy* ? ?Follow-Up: ?At [facility], you and your health needs are our priority.  As part of our continuing mission to provide you with exceptional heart care, we have created designated Provider Care Teams.  These Care Teams include your primary Cardiologist (physician) and Advanced Practice Providers (APPs -  Physician Assistants and Nurse Practitioners) who all work together to provide you with the care you need, when you need it. ? ?Your next appointment:   ?6 month(s) ? ?The format for your next appointment:   ?In Person ? ?Provider:   ?Gayatri A Acharya, MD   ? ?  ?

## 2021-09-10 ENCOUNTER — Other Ambulatory Visit: Payer: Self-pay

## 2021-09-10 ENCOUNTER — Encounter (INDEPENDENT_AMBULATORY_CARE_PROVIDER_SITE_OTHER): Payer: Self-pay | Admitting: Family Medicine

## 2021-09-10 ENCOUNTER — Ambulatory Visit (INDEPENDENT_AMBULATORY_CARE_PROVIDER_SITE_OTHER): Payer: BC Managed Care – PPO | Admitting: Family Medicine

## 2021-09-10 VITALS — BP 110/76 | HR 63 | Temp 98.0°F | Ht 62.0 in | Wt 212.0 lb

## 2021-09-10 DIAGNOSIS — R7303 Prediabetes: Secondary | ICD-10-CM

## 2021-09-10 DIAGNOSIS — K5909 Other constipation: Secondary | ICD-10-CM

## 2021-09-10 DIAGNOSIS — Z9189 Other specified personal risk factors, not elsewhere classified: Secondary | ICD-10-CM

## 2021-09-10 DIAGNOSIS — D508 Other iron deficiency anemias: Secondary | ICD-10-CM | POA: Diagnosis not present

## 2021-09-10 DIAGNOSIS — E559 Vitamin D deficiency, unspecified: Secondary | ICD-10-CM

## 2021-09-10 DIAGNOSIS — I10 Essential (primary) hypertension: Secondary | ICD-10-CM

## 2021-09-10 DIAGNOSIS — J45909 Unspecified asthma, uncomplicated: Secondary | ICD-10-CM

## 2021-09-10 DIAGNOSIS — Z6841 Body Mass Index (BMI) 40.0 and over, adult: Secondary | ICD-10-CM

## 2021-09-10 MED ORDER — ALBUTEROL SULFATE HFA 108 (90 BASE) MCG/ACT IN AERS: INHALATION_SPRAY | RESPIRATORY_TRACT | 0 refills | Status: DC

## 2021-09-10 MED ORDER — VITAMIN D (ERGOCALCIFEROL) 1.25 MG (50000 UNIT) PO CAPS: ORAL_CAPSULE | ORAL | 0 refills | Status: DC

## 2021-09-10 MED ORDER — LINACLOTIDE 145 MCG PO CAPS: 145.0000 ug | ORAL_CAPSULE | Freq: Every day | ORAL | 0 refills | Status: DC

## 2021-09-10 MED ORDER — SEMAGLUTIDE (1 MG/DOSE) 4 MG/3ML ~~LOC~~ SOPN: 1.0000 mg | PEN_INJECTOR | SUBCUTANEOUS | 0 refills | Status: DC

## 2021-09-11 ENCOUNTER — Ambulatory Visit: Payer: BC Managed Care – PPO | Attending: Family | Admitting: Physical Therapy

## 2021-09-11 DIAGNOSIS — R278 Other lack of coordination: Secondary | ICD-10-CM | POA: Diagnosis present

## 2021-09-11 DIAGNOSIS — R2689 Other abnormalities of gait and mobility: Secondary | ICD-10-CM | POA: Insufficient documentation

## 2021-09-11 DIAGNOSIS — N393 Stress incontinence (female) (male): Secondary | ICD-10-CM | POA: Diagnosis present

## 2021-09-11 NOTE — Patient Instructions (Signed)
Make sure to find pelvic neutral not too tucked under.  Pelvic floor and deep core program :  Quick squeeze  Inhale, exhale quick squeeze  5 reps   Deep core level 1 - breathing   Deep core level 2  - move on one knee out  6 min   Long holds: Inhale , exhale, squeeze count aloud 1 thousand to 3 thousand and then breathe 2 breaths  3 reps   __   3 x day

## 2021-09-11 NOTE — Therapy (Signed)
[place] MAIN [facility] SERVICES [address] [place], [place], [address] Phone: [phone]   Fax:  [phone]  Physical Therapy Treatment  Patient Details  Name: Molly Bean MRN: [id number] Date of Birth: Sep 06, 1961 Referring Provider (PT): Wannetta Sender MD   Encounter Date: 09/11/2021   PT End of Session - 09/11/21 1231     Visit Number 16    Date for PT Re-Evaluation 60/10/93   PN & recert 2/35/57   PT Start Time 0900    PT Stop Time 1000    PT Time Calculation (min) 60 min    Activity Tolerance Patient tolerated treatment well    Behavior During Therapy [facility] for tasks assessed/performed             Past Medical History:  Diagnosis Date   Anxiety    Constipation    DEPRESSION    edema lower extremeties    GERD    Joint pain    MENOPAUSAL SYNDROME    Multiple food allergies    OBESITY    Shortness of breath    Sleep apnea    wears CPAP    Past Surgical History:  Procedure Laterality Date   ABDOMINAL HYSTERECTOMY  11/1998   total fibroids   CESAREAN SECTION      There were no vitals filed for this visit.   Subjective Assessment - 09/11/21 0906     Subjective Pt started a new medication for OAB and she is not having side effects so far after taking it for a week. Leakage is still there. Pt is still doing her exercises    Pertinent History C-section, abdominal hysterectomy.    Patient Stated Goals decreaase leakage, have sex and get through pelvic exams without pain,                [facility] PT Assessment - 09/11/21 0909       Strength   Overall Strength Comments hip abd 3+/5 B                        Pelvic Floor Special Questions - 09/11/21 0938     Pelvic Floor Internal Exam pt consented verbally and had no contraindications    Exam Type Vaginal    Palpation / tenderness only when pt posterior pelvic til,    Strength good squeeze, good lift, able to hold agaisnt strong resistance     Strength # of reps --    Biofeedback tactile and verbal cues:  5 quick, 3 sec, 3 reps               [facility] Adult PT Treatment/Exercise - 09/11/21 1228       Bed Mobility   Bed Mobility --   [id number]     Neuro Re-ed    Neuro Re-ed Details  kegel training ,cued for less posterior tilt of pelvis to minimize tightness/ tenderness of pelvic floor, , cued for lengthening in between long holds, cued for deep core level 2 instructions as pt had been perofrming hem incorrectly      Manual Therapy   Manual therapy comments tactile biofeedback to advance to quick and long contractions for kegel training                          PT Long Term Goals - 09/11/21 1232       PT LONG TERM GOAL #1   Title Pt will  demo increased hip abduction strength from 3/5 to >4/5 in order to maintain walking routine and pelvic stability    Time 8    Period Weeks    Status On-going      PT LONG TERM GOAL #2   Title Pt will demo proper deepcore coordination and pelvic floor movement with no cues to progress to kegel exercises    Time 4    Period Weeks    Status Achieved      PT LONG TERM GOAL #3   Title Pt will demo decreased abdominal scar restrictions to advance to deep core exercises and promote IAP for continence and bowel movement elimination with less dependence on Linzess    Time 4    Period Weeks    Status Achieved      PT LONG TERM GOAL #4   Title Pt will increase FOTO score for constipation from 41 pts and Urinary 47 pts to > 55 pts in order to demo improved pelvic floor function  ( 06/15/21: no change urinary, 2 pt change improvement  with bowel function,  09/11/21: did not administer, plan for next session )    Time 10    Period Weeks    Status On-going      PT LONG TERM GOAL #5   Title Pt will demo decreased pelvic floor mm tightness and proper relaxation of mm to have less pain with intercourcse and gynecological exams    Time 10    Period Weeks    Status Achieved       Additional Long Term Goals   Additional Long Term Goals Yes      PT LONG TERM GOAL #6   Title Pt will demo no pelvic floor tightness/ tendernessacorss 2 sessions, demo proper quick contractions > 5 reps and long holds 3sec, 5 reps without cues ino rder to gain continence    Time 10    Period Weeks    Status New    Target Date 11/20/21                   Plan - 09/11/21 1231     Clinical Impression Statement Pt has achieved 3/10 goals and progressing well towards remaining goals. Pt has achieved a milestone improvement with no more pelvic floor tightness which rendered her appropriate for kegel training. Pt demo'd proper technique after cues for more anterior tilt of pelvis for quick and long contractions. Plan to advance her to quick contractions in seated position at next session. Pt continues to benefit from skilled PT                                                                              Examination-Activity Limitations Toileting;Continence;Other    Stability/Clinical Decision Making Evolving/Moderate complexity    Rehab Potential Good    PT Frequency 1x / week    PT Duration Other (comment)   10   PT Treatment/Interventions Neuromuscular re-education;Cryotherapy;ADLs/Self Care Home Management;Therapeutic activities;Functional mobility training;Traction;Moist Heat;Stair training;Gait training;Patient/family education;Taping;Manual techniques;Scar mobilization;Balance training;Therapeutic exercise;Spinal Manipulations;Joint Manipulations;Energy conservation    Consulted and Agree with Plan of Care Patient             Patient will benefit  from skilled therapeutic intervention in order to improve the following deficits and impairments:  Decreased endurance, Decreased safety awareness, Difficulty walking, Decreased coordination, Abnormal gait, Improper body mechanics, Pain, Increased muscle spasms, Hypermobility, Hypomobility, Decreased scar mobility, Decreased mobility,  Decreased strength, Postural dysfunction  Visit Diagnosis: Stress incontinence of urine  Other abnormalities of gait and mobility  Other lack of coordination     Problem List Patient Active Problem List   Diagnosis Date Noted   Axillary hidradenitis suppurativa 07/07/2021   Proteus mirabilis infection 01/16/2021   Cutaneous abscess of right axilla 01/13/2021   Iron deficiency anemia 12/10/2020   Prediabetes 08/06/2020   Chronic constipation 08/06/2020   Vitamin D deficiency 08/06/2020   Vaginitis 04/09/2020   Essential hypertension 05/01/2018   Asthma 03/14/2018   Hidradenitis suppurativa 01/18/2018   GAD (generalized anxiety disorder) 10/11/2016   MENOPAUSAL SYNDROME 04/23/2009   OBESITY 02/15/2008   Gastroesophageal reflux disease 04/12/2007    Jerl Mina, PT 09/11/2021, 12:34 PM  [place] MAIN [facility] SERVICES [place], [place], [address] Phone: [phone]   Fax:  [phone]  Name: Gabreille Dardis MRN: [id number] Date of Birth: Jul 02, 1961

## 2021-09-15 NOTE — Progress Notes (Signed)
Chief Complaint:   OBESITY Molly Bean is here to discuss her progress with her obesity treatment plan along with follow-up of her obesity related diagnoses. Molly Bean is on the Category 1 Plan and states she is following her eating plan approximately 95% of the time. Molly Bean states she is walking for 60 minutes 6 times per week.  Today's visit was #: 23 Starting weight: 240 lbs Starting date: 04/15/2020 Today's weight: 212 lbs Today's date: 09/10/2021 Total lbs lost to date: 28 Total lbs lost since last in-office visit: 5  Interim History: Molly Bean is walking more which is the only thing she has changed in the past couple of weeks.  Subjective:   1. Essential hypertension Molly Bean's blood pressures at home are about the same as it is here today. Cardiovascular ROS: no chest pain or dyspnea on exertion.  BP Readings from Last 3 Encounters:  09/10/21 110/76  09/04/21 124/72  09/02/21 115/71   2. Pre-diabetes Molly Bean denies cravings or hunger, but she can eat all of her food on the plan.  3. Other iron deficiency anemia Molly Bean is not a vegetarian.  She does not have a history of weight loss surgery.   4. Other constipation Molly Bean notes constipation. She is taking Linzess and she drinks 5 bottles of water per day.  5. Asthma, unspecified asthma severity, unspecified whether complicated, unspecified whether persistent Fontaine needs a refill of albuterol. She notes she only uses it approximately 2 times per month. Her symptoms are under good control.  6. Vitamin D deficiency [name] is currently taking prescription vitamin D 50,000 IU twice weekly. She denies nausea, vomiting or muscle weakness.  7. At risk for deficient intake of food [name] is at a higher than average risk of deficient intake of food due to medications side effects with appetite suppression.  Assessment/Plan:   Orders Placed This Encounter  Procedures   VITAMIN D 25 Hydroxy (Vit-D Deficiency,  Fractures)   CBC with Differential/Platelet   Iron, TIBC and Ferritin Panel   Transferrin   Insulin, random   Hemoglobin A1c   Lipid Panel With LDL/HDL Ratio   Comprehensive metabolic panel    Medications Discontinued During This Encounter  Medication Reason   albuterol (VENTOLIN HFA) 108 (90 Base) MCG/ACT inhaler Reorder   linaclotide (LINZESS) 145 MCG CAPS capsule Reorder   Semaglutide, 1 MG/DOSE, 4 MG/3ML SOPN Reorder   Vitamin D, Ergocalciferol, (DRISDOL) 1.25 MG (50000 UNIT) CAPS capsule Reorder     Meds ordered this encounter  Medications   Vitamin D, Ergocalciferol, (DRISDOL) 1.25 MG (50000 UNIT) CAPS capsule    Sig: 1 po q wed, and 1 po q sun    Dispense:  8 capsule    Refill:  0    Ov for rf   linaclotide (LINZESS) 145 MCG CAPS capsule    Sig: Take 1 capsule (145 mcg total) by mouth daily before breakfast.    Dispense:  30 capsule    Refill:  0   albuterol (VENTOLIN HFA) 108 (90 Base) MCG/ACT inhaler    Sig: TAKE 2 PUFFS BY MOUTH EVERY 6 HOURS AS NEEDED FOR WHEEZE OR SHORTNESS OF BREATH    Dispense:  8.5 g    Refill:  0   Semaglutide, 1 MG/DOSE, 4 MG/3ML SOPN    Sig: Inject 1 mg as directed once a week.    Dispense:  3 mL    Refill:  0    One mo supply; ov for rf     1.  Essential hypertension We will check labs today. Molly Bean will continue working on healthy weight loss and exercise to improve blood pressure control. We will watch for signs of hypotension as she continues her lifestyle modifications.  - Lipid Panel With LDL/HDL Ratio - Comprehensive metabolic panel  2. Pre-diabetes We will check labs today, and we will refill Ozempic for 1 month. Molly Bean will continue to work on weight loss, exercise, and decreasing simple carbohydrates to help decrease the risk of diabetes.   - Semaglutide, 1 MG/DOSE, 4 MG/3ML SOPN; Inject 1 mg as directed once a week.  Dispense: 3 mL; Refill: 0 - Insulin, random - Hemoglobin A1c  3. Other iron deficiency anemia We  will check labs today, and will follow up at Molly Bean's next office visit. Orders and follow up as documented in patient record.  Counseling Iron is essential for our bodies to make red blood cells.  Reasons that someone may be deficient include: an iron-deficient diet (more likely in those following vegan or vegetarian diets), women with heavy menses, patients with GI disorders or poor absorption, patients that have had bariatric surgery, frequent blood donors, patients with cancer, and patients with heart disease.   Iron-rich foods include dark leafy greens, red and white meats, eggs, seafood, and beans.   Certain foods and drinks prevent your body from absorbing iron properly. Avoid eating these foods in the same meal as iron-rich foods or with iron supplements. These foods include: coffee, black tea, and red wine; milk, dairy products, and foods that are high in calcium; beans and soybeans; whole grains.  Constipation can be a side effect of iron supplementation. Increased water and fiber intake are helpful. Water goal: > 2 liters/day. Fiber goal: > 25 grams/day.  - CBC with Differential/Platelet - Iron, TIBC and Ferritin Panel - Transferrin  4. Other constipation We will refill Linzess for 1 month. Molly Bean was informed that a decrease in bowel movement frequency is normal while losing weight, but stools should not be hard or painful. Orders and follow up as documented in patient record.   Counseling Getting to Good Bowel Health: Your goal is to have one soft bowel movement each day. Drink at least 8 glasses of water each day. Eat plenty of fiber (goal is over 25 grams each day). It is best to get most of your fiber from dietary sources which includes leafy green vegetables, fresh fruit, and whole grains. You may need to add fiber with the help of OTC fiber supplements. These include Metamucil, Citrucel, and Flaxseed. If you are still having trouble, try adding Miralax or Magnesium Citrate. If  all of these changes do not work, Cabin crew.  - linaclotide (LINZESS) 145 MCG CAPS capsule; Take 1 capsule (145 mcg total) by mouth daily before breakfast.  Dispense: 30 capsule; Refill: 0  5. Asthma, unspecified asthma severity, unspecified whether complicated, unspecified whether persistent Since Katyana only uses her albuterol 2 times per month, we will refill once but I informed the patient that if more frequent symptoms occur to consider Singulair.   - albuterol (VENTOLIN HFA) 108 (90 Base) MCG/ACT inhaler; TAKE 2 PUFFS BY MOUTH EVERY 6 HOURS AS NEEDED FOR WHEEZE OR SHORTNESS OF BREATH  Dispense: 8.5 g; Refill: 0  6. Vitamin D deficiency We will check labs today, and we will refill prescription Vitamin D for 1 month. Noni will follow-up for routine testing of Vitamin D, at least 2-3 times per year to avoid over-replacement.  - Vitamin D, Ergocalciferol, (DRISDOL) 1.25  MG (50000 UNIT) CAPS capsule; 1 po q wed, and 1 po q sun  Dispense: 8 capsule; Refill: 0 - VITAMIN D 25 Hydroxy (Vit-D Deficiency, Fractures)  7. At risk for deficient intake of food Lilyana was given approximately 15 minutes of deficit intake of food prevention counseling today. Jadine is at risk for eating too few calories based on current food recall. She was encouraged to focus on meeting caloric and protein goals according to her recommended meal plan.   8. Obesity with current BMI of 38.9 Michaline is currently in the action stage of change. As such, her goal is to continue with weight loss efforts. She has agreed to the Category 1 Plan.   Emelda will take a sip of water between each bite of food as a strategy to increase her water intake, and to be more mindful.  Exercise goals: As is.  Behavioral modification strategies: increasing lean protein intake and decreasing simple carbohydrates.  Janella has agreed to follow-up with our clinic in 2 to 4 weeks. She was informed of the importance of  frequent follow-up visits to maximize her success with intensive lifestyle modifications for her multiple health conditions.   Terrilee was informed we would discuss her lab results at her next visit unless there is a critical issue that needs to be addressed sooner. Syndi agreed to keep her next visit at the agreed upon time to discuss these results.  Objective:   Blood pressure 110/76, pulse 63, temperature 98 F (36.7 C), height 5\' 2"  (1.575 m), weight 212 lb (96.2 kg), SpO2 99 %. Body mass index is 38.78 kg/m.  General: Cooperative, alert, well developed, in no acute distress. HEENT: Conjunctivae and lids unremarkable. Cardiovascular: Regular rhythm.  Lungs: Normal work of breathing. Neurologic: No focal deficits.   Lab Results  Component Value Date   CREATININE 0.78 07/01/2021   BUN 10 07/01/2021   NA 143 07/01/2021   K 4.3 07/01/2021   CL 104 07/01/2021   CO2 26 07/01/2021   Lab Results  Component Value Date   ALT 15 07/01/2021   AST 16 07/01/2021   ALKPHOS 74 07/01/2021   BILITOT 0.2 07/01/2021   Lab Results  Component Value Date   HGBA1C 5.9 (H) 07/01/2021   HGBA1C 6.0 (H) 03/23/2021   HGBA1C 6.2 (H) 12/30/2020   HGBA1C 6.1 (H) 08/19/2020   HGBA1C 6.1 (H) 04/15/2020   Lab Results  Component Value Date   INSULIN 21.1 03/23/2021   INSULIN 21.9 04/15/2020   Lab Results  Component Value Date   TSH 1.100 04/15/2020   Lab Results  Component Value Date   CHOL 169 07/01/2021   HDL 58 07/01/2021   LDLCALC 99 07/01/2021   LDLDIRECT 95 12/30/2020   TRIG 63 07/01/2021   CHOLHDL 2.9 07/01/2021   Lab Results  Component Value Date   VD25OH 47.7 05/20/2021   VD25OH 46.9 12/30/2020   VD25OH 24.3 (L) 08/19/2020   Lab Results  Component Value Date   WBC 9.2 12/30/2020   HGB 12.6 12/30/2020   HCT 40.6 12/30/2020   MCV 69 (L) 12/30/2020   PLT 220 12/30/2020   Lab Results  Component Value Date   IRON 51 12/30/2020   TIBC 300 12/30/2020   FERRITIN 55  12/30/2020   Attestation Statements:   Reviewed by clinician on day of visit: allergies, medications, problem list, medical history, surgical history, family history, social history, and previous encounter notes.   Wilhemena Durie, am acting as Location manager for [employer]  Maxie Debose, DO.  I have reviewed the above documentation for accuracy and completeness, and I agree with the above. Marjory Sneddon, D.O.  The [place] was signed into law in 2016 which includes the topic of electronic health records.  This provides immediate access to information in MyChart.  This includes consultation notes, operative notes, office notes, lab results and pathology reports.  If you have any questions about what you read please let us know at your next visit so we can discuss your concerns and take corrective action if need be.  We are right here with you.

## 2021-09-16 ENCOUNTER — Telehealth: Payer: Self-pay

## 2021-09-16 NOTE — Telephone Encounter (Signed)
-----   Message from Elita Quick, [place] sent at 09/10/2021  8:55 AM EST ----- Regarding: Med check- Myrbetriq 25mg  Pt stated Myrbetriq 25mg  09/02/21 Check to see if its helping

## 2021-09-16 NOTE — Telephone Encounter (Signed)
Attempted to contact Molly Bean is a 60 y.o. female re: Myrbetriq 25mg  Pt was not available. I LM on the VM for the patient to call me back.

## 2021-09-17 ENCOUNTER — Encounter (INDEPENDENT_AMBULATORY_CARE_PROVIDER_SITE_OTHER): Payer: Self-pay

## 2021-09-17 ENCOUNTER — Other Ambulatory Visit (INDEPENDENT_AMBULATORY_CARE_PROVIDER_SITE_OTHER): Payer: Self-pay | Admitting: Bariatrics

## 2021-09-17 DIAGNOSIS — E7849 Other hyperlipidemia: Secondary | ICD-10-CM

## 2021-09-17 NOTE — Telephone Encounter (Signed)
MyChart message sent to pt to find out if they have enough medication to get them through until next appt.   

## 2021-09-21 ENCOUNTER — Other Ambulatory Visit (INDEPENDENT_AMBULATORY_CARE_PROVIDER_SITE_OTHER): Payer: Self-pay | Admitting: Family Medicine

## 2021-09-21 ENCOUNTER — Ambulatory Visit: Payer: BC Managed Care – PPO | Admitting: Physical Therapy

## 2021-09-21 DIAGNOSIS — E559 Vitamin D deficiency, unspecified: Secondary | ICD-10-CM

## 2021-09-21 NOTE — Telephone Encounter (Signed)
Molly Bean is a 60 y.o. female called back 09/18/21. Pt said she started the medication late. I told her I will call her on 12/22 to get an update to see if the medication working.

## 2021-09-21 NOTE — Telephone Encounter (Signed)
Dr.Opalski ?

## 2021-10-02 ENCOUNTER — Other Ambulatory Visit: Payer: Self-pay | Admitting: Internal Medicine

## 2021-10-02 DIAGNOSIS — L732 Hidradenitis suppurativa: Secondary | ICD-10-CM

## 2021-10-13 ENCOUNTER — Ambulatory Visit: Payer: BC Managed Care – PPO | Attending: Family | Admitting: Physical Therapy

## 2021-10-13 ENCOUNTER — Other Ambulatory Visit: Payer: Self-pay

## 2021-10-13 DIAGNOSIS — R278 Other lack of coordination: Secondary | ICD-10-CM | POA: Insufficient documentation

## 2021-10-13 DIAGNOSIS — R2689 Other abnormalities of gait and mobility: Secondary | ICD-10-CM | POA: Diagnosis present

## 2021-10-13 DIAGNOSIS — N393 Stress incontinence (female) (male): Secondary | ICD-10-CM | POA: Insufficient documentation

## 2021-10-13 NOTE — Patient Instructions (Signed)
° ° °  kitchen counter stretches  Hands on kitchen counter,   Palms shoulder width apart  Minisquat postion Trunk is parallel to floor  A) Pull buttocks back to lengthen spine, knees bent  3 breaths   B) Bring R hand to the L, and stretch the R side trunk  3 breaths   Brings hands to center again Do the same to the L side stretch by placing L hand on top of R   D) Modified thread the needle R hand on L thigh, L  thigh pushing out slightly as the R hands pull in,  elbow bent and pulls to theR,  Look under L armpit   Do the same to other side  

## 2021-10-13 NOTE — Therapy (Signed)
[place] MAIN [facility] SERVICES [address] [place], [place], [address] Phone: [phone]   Fax:  [phone]  Physical Therapy Treatment  Patient Details  Name: Molly Bean MRN: [id number] Date of Birth: [birth year] Referring Provider (PT): Wannetta Sender MD   Encounter Date: 10/13/2021   PT End of Session - 10/13/21 0812     Visit Number 17    Date for PT Re-Evaluation 67/89/38   PN & recert 10/04/73   PT Start Time 0800    PT Stop Time 0900    PT Time Calculation (min) 60 min    Activity Tolerance Patient tolerated treatment well    Behavior During Therapy [facility] for tasks assessed/performed             Past Medical History:  Diagnosis Date   Anxiety    Constipation    DEPRESSION    edema lower extremeties    GERD    Joint pain    MENOPAUSAL SYNDROME    Multiple food allergies    OBESITY    Shortness of breath    Sleep apnea    wears CPAP    Past Surgical History:  Procedure Laterality Date   ABDOMINAL HYSTERECTOMY  11/1998   total fibroids   CESAREAN SECTION      There were no vitals filed for this visit.   Subjective Assessment - 10/13/21 0807     Subjective Pt reported she leaks when she gets out of bed in the morning which started after starting this new OAB medication.. The OAB medication is causing her to not urinate till the day and causes headaches and she will see her MD this Friday about that. She started doing 15 sit ups each day since Dec 15. Pt walks the treadmill for 30 min and stretches before and after. Across the past 6 months, pt lost 32.5 lbs from 248 to 215.5 as as of today and she is aiming for 200 lbs.    Pertinent History C-section, abdominal hysterectomy.    Patient Stated Goals decreaase leakage, have sex and get through pelvic exams without pain,                [facility] PT Assessment - 10/13/21 1345       Coordination   Coordination and Movement Description limited diaphragmatic  excursion                        Pelvic Floor Special Questions - 10/13/21 1345     Pelvic Floor Internal Exam pt consented verbally and had no contraindications    Exam Type Vaginal    Palpation no tenderness / tightness, limited upeward movement of pelvic floor, dyscoordination of pelvic floor .    Strength Flicker    Biofeedback tactile and verbal cues:               [facility] Adult PT Treatment/Exercise - 10/13/21 1346       Therapeutic Activites    Other Therapeutic Activities reviewed her self-selected workout routine, provided advise to stop doing sit ups which she has done for the past month. Explained other alternatives will be addressed at upcoming sessions.      Neuro Re-ed    Neuro Re-ed Details  cued for less chest breathing, more co-TrA of pelvic floor      Manual Therapy   Manual therapy comments reassessment, tactile feedback for upward movement of pelvic floor  PT Long Term Goals - 10/13/21 1344       PT LONG TERM GOAL #1   Title Pt will demo increased hip abduction strength from 3/5 to >4/5 in order to maintain walking routine and pelvic stability    Time 8    Period Weeks    Status On-going      PT LONG TERM GOAL #2   Title Pt will demo proper deepcore coordination and pelvic floor movement with no cues to progress to kegel exercises    Time 4    Period Weeks    Status Achieved      PT LONG TERM GOAL #3   Title Pt will demo decreased abdominal scar restrictions to advance to deep core exercises and promote IAP for continence and bowel movement elimination with less dependence on Linzess    Time 4    Period Weeks    Status Achieved      PT LONG TERM GOAL #4   Title Pt will increase FOTO score for constipation from 41 pts and Urinary 47 pts to > 55 pts in order to demo improved pelvic floor function  ( 06/15/21: no change urinary, 2 pt change improvement  with bowel function,  09/11/21: did not  administer, plan for next session )    Time 10    Period Weeks    Status On-going      PT LONG TERM GOAL #5   Title Pt will demo decreased pelvic floor mm tightness and proper relaxation of mm to have less pain with intercourcse and gynecological exams    Time 10    Period Weeks    Status Achieved      PT LONG TERM GOAL #6   Title Pt will demo no pelvic floor tightness/ tendernessacorss 2 sessions, demo proper quick contractions > 5 reps and long holds 3sec, 5 reps without cues ino rder to gain continence    Time 10    Period Weeks    Status On-going    Target Date 11/20/21                   Plan - 10/13/21 1334     Clinical Impression Statement Today, pt returns after a month. Reviewed her selected workout routine, provided advise to stop doing sit ups which she has done for the past month. Explained other alternatives will be addressed at upcoming sessions.  Internal assessment today showed no tenderness/ tightness but limited lengthening and upward movement of pelvic floor. Pt required cues for more diaphragmatic./pelvic excursion. Plan to focus more on coordination of pelvic floor and provide exercises that will not cause a relapse of overactivity of pelvic floor.   Pt has good prognosis with pelvic rehab  to minimize leakage as pt has lost 32. 5 lbs the past 6 months which will help with IAP system. Pt plans to discuss with her MD on the side effects of her urinary medications.   Pt benefits from skilled PT    Examination-Activity Limitations Toileting;Continence;Other    Stability/Clinical Decision Making Evolving/Moderate complexity    Rehab Potential Good    PT Frequency 1x / week    PT Duration Other (comment)   10   PT Treatment/Interventions Neuromuscular re-education;Cryotherapy;ADLs/Self Care Home Management;Therapeutic activities;Functional mobility training;Traction;Moist Heat;Stair training;Gait training;Patient/family education;Taping;Manual  techniques;Scar mobilization;Balance training;Therapeutic exercise;Spinal Manipulations;Joint Manipulations;Energy conservation    Consulted and Agree with Plan of Care Patient             Patient will  benefit from skilled therapeutic intervention in order to improve the following deficits and impairments:  Decreased endurance, Decreased safety awareness, Difficulty walking, Decreased coordination, Abnormal gait, Improper body mechanics, Pain, Increased muscle spasms, Hypermobility, Hypomobility, Decreased scar mobility, Decreased mobility, Decreased strength, Postural dysfunction  Visit Diagnosis: Stress incontinence of urine  Other abnormalities of gait and mobility  Other lack of coordination     Problem List Patient Active Problem List   Diagnosis Date Noted   Axillary hidradenitis suppurativa 07/07/2021   Proteus mirabilis infection 01/16/2021   Cutaneous abscess of right axilla 01/13/2021   Iron deficiency anemia 12/10/2020   Prediabetes 08/06/2020   Chronic constipation 08/06/2020   Vitamin D deficiency 08/06/2020   Vaginitis 04/09/2020   Essential hypertension 05/01/2018   Asthma 03/14/2018   Hidradenitis suppurativa 01/18/2018   GAD (generalized anxiety disorder) 10/11/2016   MENOPAUSAL SYNDROME 04/23/2009   OBESITY 02/15/2008   Gastroesophageal reflux disease 04/12/2007    Jerl Mina, PT 10/13/2021, 1:52 PM  [place] MAIN [facility] SERVICES [address] [place], [place], [address] Phone: [phone]   Fax:  [phone]  Name: Molly Bean MRN: [id number] Date of Birth: 05-30-1961

## 2021-10-15 ENCOUNTER — Ambulatory Visit (INDEPENDENT_AMBULATORY_CARE_PROVIDER_SITE_OTHER): Payer: BC Managed Care – PPO | Admitting: Family Medicine

## 2021-10-15 NOTE — Progress Notes (Signed)
[facility] Return Visit  SUBJECTIVE  History of Present Illness: Molly Bean is a 61 y.o. female seen in follow-up for mixed incontinence and levator spasm.   The myrbetriq makes it so she does not really go during the day. She stopped the medication because of this. She has seen some improvement with physical therapy. She feels like she just does not have the muscle strength to hold the urine rather than the urgency. If she has to go during the day, already sees some leakage.   Previously tried trospium and vesicare without benefit.  Still having some pain with intercourse. Not taking the flexeril.   Past Medical History: Patient  has a past medical history of Anxiety, Constipation, DEPRESSION, edema lower extremeties, GERD, Joint pain, MENOPAUSAL SYNDROME, Multiple food allergies, OBESITY, Shortness of breath, and Sleep apnea.   Past Surgical History: She  has a past surgical history that includes Cesarean section and Abdominal hysterectomy (11/1998).   Medications: She has a current medication list which includes the following prescription(s): acetaminophen, albuterol, bupropion, iron, linaclotide, metformin, rosuvastatin, semaglutide (1 mg/dose), triamterene-hydrochlorothiazide, vitamin d (ergocalciferol), and mirabegron er.   Allergies: Patient is allergic to shellfish allergy, pennsaid [diclofenac sodium], and oxycodone.   Social History: Patient  reports that she quit smoking about 44 years ago. Her smoking use included cigarettes. She has a 6.00 pack-year smoking history. She quit smokeless tobacco use about 34 years ago. She reports that she does not drink alcohol and does not use drugs.      OBJECTIVE     Physical Exam: Vitals:   10/16/21 0811  BP: 102/61  Pulse: 69  Weight: 212 lb (96.2 kg)  Height: 5\' 2"  (1.575 m)    Gen: No apparent distress, A&O x 3.  Detailed Urogynecologic Evaluation:  Deferred.    ASSESSMENT AND PLAN    Molly Bean  is a 61 y.o. with:  1. Overactive bladder   2. Dyspareunia in female     - She would like to hold off on medication at this time and continue with physical therapy since she has seen improvement with this. She will also work with PT for the dyspareunia - Discussed third line therapies but she would like to avoid procedures at this time.   Follow up 3 months or sooner if needed  Jaquita Folds, MD   Time spent: I spent 20 minutes dedicated to the care of this patient on the date of this encounter to include pre-visit review of records, face-to-face time with the patient and post visit documentation.

## 2021-10-16 ENCOUNTER — Ambulatory Visit (INDEPENDENT_AMBULATORY_CARE_PROVIDER_SITE_OTHER): Payer: BC Managed Care – PPO | Admitting: Obstetrics and Gynecology

## 2021-10-16 ENCOUNTER — Encounter: Payer: Self-pay | Admitting: Obstetrics and Gynecology

## 2021-10-16 ENCOUNTER — Other Ambulatory Visit: Payer: Self-pay

## 2021-10-16 VITALS — BP 102/61 | HR 69 | Ht 62.0 in | Wt 212.0 lb

## 2021-10-16 DIAGNOSIS — N941 Unspecified dyspareunia: Secondary | ICD-10-CM | POA: Diagnosis not present

## 2021-10-16 DIAGNOSIS — N3281 Overactive bladder: Secondary | ICD-10-CM

## 2021-10-22 ENCOUNTER — Encounter: Payer: Self-pay | Admitting: Nurse Practitioner

## 2021-10-22 ENCOUNTER — Ambulatory Visit (INDEPENDENT_AMBULATORY_CARE_PROVIDER_SITE_OTHER): Payer: BC Managed Care – PPO | Admitting: Nurse Practitioner

## 2021-10-22 ENCOUNTER — Other Ambulatory Visit: Payer: Self-pay

## 2021-10-22 VITALS — BP 140/80 | HR 71 | Temp 98.5°F | Ht 62.0 in | Wt 216.0 lb

## 2021-10-22 DIAGNOSIS — N644 Mastodynia: Secondary | ICD-10-CM

## 2021-10-22 NOTE — Progress Notes (Signed)
Subjective:  Patient ID: Molly Bean, female    DOB: 01-15-1961  Age: 61 y.o. MRN: [id number]  CC:  Chief Complaint  Patient presents with   Breast Pain      HPI  This patient arrives today for the above.  She has been experiencing right-sided breast pain for approximately 1 week.  She denies any changes to skin of her breast, new nipple inversion, nipple discharge, or masses.  She does have a sister who was diagnosed with stage IV breast cancer about 2 years ago.  She tells me bending over makes the pain worse.  She tried to schedule her own mammogram, but was told she would need to be referred to have diagnostic mammogram completed by provider.  Past Medical History:  Diagnosis Date   Anxiety    Constipation    DEPRESSION    edema lower extremeties    GERD    Joint pain    MENOPAUSAL SYNDROME    Multiple food allergies    OBESITY    Shortness of breath    Sleep apnea    wears CPAP      Family History  Problem Relation Age of Onset   Coronary artery disease Mother    Hypertension Mother    Diabetes Mother    Atrial fibrillation Mother    Sleep apnea Mother    Obesity Mother    Seizures Father    Stroke Father    Lupus Cousin    Breast cancer Sister 54    Social History   Social History Narrative   Not on file   Social History   Tobacco Use   Smoking status: Former    Packs/day: 0.30    Years: 20.00    Pack years: 6.00    Types: Cigarettes    Quit date: 10/04/1977    Years since quitting: 44.0   Smokeless tobacco: Former    Quit date: 10/05/1987  Substance Use Topics   Alcohol use: No     No outpatient medications have been marked as taking for the 10/22/21 encounter (Office Visit) with Ailene Ards, NP.    ROS:  See HPI   Objective:   Today's Vitals: BP 140/80 (BP Location: Left Arm, Patient Position: Sitting, Cuff Size: Large)    Pulse 71    Temp 98.5 F (36.9 C) (Oral)    Ht 5\' 2"  (1.575 m)    Wt 216 lb (98 kg)    SpO2 99%     BMI 39.51 kg/m  Vitals with BMI 10/22/2021 10/16/2021 09/10/2021  Height 5\' 2"  5\' 2"  5\' 2"   Weight 216 lbs 212 lbs 212 lbs  BMI 39.5 44.81 85.63  Systolic 149 702 637  Diastolic 80 61 76  Pulse 71 69 63     Physical Exam Vitals reviewed.  Constitutional:      General: She is not in acute distress.    Appearance: Normal appearance.  HENT:     Head: Normocephalic and atraumatic.  Neck:     Vascular: No carotid bruit.  Cardiovascular:     Rate and Rhythm: Normal rate and regular rhythm.     Pulses: Normal pulses.     Heart sounds: Normal heart sounds.  Pulmonary:     Effort: Pulmonary effort is normal.     Breath sounds: Normal breath sounds.  Chest:  Breasts:    Right: No swelling, inverted nipple, nipple discharge, skin change or tenderness.     Left: Normal.  Comments: No obvious mass noted to right breast, maybe some dense tissue at approximately 2 o'clock Skin:    General: Skin is warm and dry.  Neurological:     General: No focal deficit present.     Mental Status: She is alert and oriented to person, place, and time.  Psychiatric:        Mood and Affect: Mood normal.        Behavior: Behavior normal.        Judgment: Judgment normal.         Assessment and Plan   1. Breast pain, right      Plan: Diagnostic mammogram and ultrasound ordered today.  Patient requesting to be seen at [facility] on [address].  Patient told to follow-up if symptoms worsen especially if she experiences any skin changes as I do see a history of hydradenitis suppurativa.  She is also notified to call us if she does not hear from scheduling within the next week to have her imaging scheduled.   Tests ordered Orders Placed This Encounter  Procedures   MM Digital Diagnostic Unilat R   US BREAST COMPLETE UNI RIGHT INC AXILLA      No orders of the defined types were placed in this encounter.   Patient to follow-up as needed.  Ailene Ards, NP

## 2021-10-25 ENCOUNTER — Other Ambulatory Visit (INDEPENDENT_AMBULATORY_CARE_PROVIDER_SITE_OTHER): Payer: Self-pay | Admitting: Family Medicine

## 2021-10-25 DIAGNOSIS — E559 Vitamin D deficiency, unspecified: Secondary | ICD-10-CM

## 2021-10-26 NOTE — Telephone Encounter (Signed)
Dr.Opalski ?

## 2021-10-27 ENCOUNTER — Other Ambulatory Visit (INDEPENDENT_AMBULATORY_CARE_PROVIDER_SITE_OTHER): Payer: Self-pay | Admitting: Family Medicine

## 2021-10-27 DIAGNOSIS — J45909 Unspecified asthma, uncomplicated: Secondary | ICD-10-CM

## 2021-11-04 LAB — CBC WITH DIFFERENTIAL/PLATELET
Basophils Absolute: 0 10*3/uL (ref 0.0–0.2)
Basos: 1 %
EOS (ABSOLUTE): 0.1 10*3/uL (ref 0.0–0.4)
Eos: 1 %
Hematocrit: 40.8 % (ref 34.0–46.6)
Hemoglobin: 12.4 g/dL (ref 11.1–15.9)
Immature Grans (Abs): 0 10*3/uL (ref 0.0–0.1)
Immature Granulocytes: 0 %
Lymphocytes Absolute: 2.3 10*3/uL (ref 0.7–3.1)
Lymphs: 35 %
MCH: 21 pg — ABNORMAL LOW (ref 26.6–33.0)
MCHC: 30.4 g/dL — ABNORMAL LOW (ref 31.5–35.7)
MCV: 69 fL — ABNORMAL LOW (ref 79–97)
Monocytes Absolute: 0.4 10*3/uL (ref 0.1–0.9)
Monocytes: 6 %
Neutrophils Absolute: 3.7 10*3/uL (ref 1.4–7.0)
Neutrophils: 57 %
Platelets: 235 10*3/uL (ref 150–450)
RBC: 5.91 x10E6/uL — ABNORMAL HIGH (ref 3.77–5.28)
RDW: 18.7 % — ABNORMAL HIGH (ref 11.7–15.4)
WBC: 6.4 10*3/uL (ref 3.4–10.8)

## 2021-11-04 LAB — COMPREHENSIVE METABOLIC PANEL
ALT: 10 IU/L (ref 0–32)
AST: 15 IU/L (ref 0–40)
Albumin/Globulin Ratio: 2 (ref 1.2–2.2)
Albumin: 4.6 g/dL (ref 3.8–4.8)
Alkaline Phosphatase: 90 IU/L (ref 44–121)
BUN/Creatinine Ratio: 9 — ABNORMAL LOW (ref 12–28)
BUN: 8 mg/dL (ref 8–27)
Bilirubin Total: 0.3 mg/dL (ref 0.0–1.2)
CO2: 25 mmol/L (ref 20–29)
Calcium: 9.5 mg/dL (ref 8.7–10.3)
Chloride: 102 mmol/L (ref 96–106)
Creatinine, Ser: 0.89 mg/dL (ref 0.57–1.00)
Globulin, Total: 2.3 g/dL (ref 1.5–4.5)
Glucose: 80 mg/dL (ref 70–99)
Potassium: 4.4 mmol/L (ref 3.5–5.2)
Sodium: 142 mmol/L (ref 134–144)
Total Protein: 6.9 g/dL (ref 6.0–8.5)
eGFR: 73 mL/min/{1.73_m2} (ref >59)

## 2021-11-04 LAB — HEMOGLOBIN A1C
Est. average glucose Bld gHb Est-mCnc: 117 mg/dL
Hgb A1c MFr Bld: 5.7 % — ABNORMAL HIGH (ref 4.8–5.6)

## 2021-11-04 LAB — IRON,TIBC AND FERRITIN PANEL
Ferritin: 72 ng/mL (ref 15–150)
Iron Saturation: 19 % (ref 15–55)
Iron: 63 ug/dL (ref 27–139)
Total Iron Binding Capacity: 332 ug/dL (ref 250–450)
UIBC: 269 ug/dL (ref 118–369)

## 2021-11-04 LAB — LIPID PANEL WITH LDL/HDL RATIO
Cholesterol, Total: 170 mg/dL (ref 100–199)
HDL: 62 mg/dL (ref >39)
LDL Chol Calc (NIH): 97 mg/dL (ref 0–99)
LDL/HDL Ratio: 1.6 ratio (ref 0.0–3.2)
Triglycerides: 58 mg/dL (ref 0–149)
VLDL Cholesterol Cal: 11 mg/dL (ref 5–40)

## 2021-11-04 LAB — INSULIN, RANDOM: INSULIN: 22.5 u[IU]/mL (ref 2.6–24.9)

## 2021-11-04 LAB — TRANSFERRIN: Transferrin: 255 mg/dL (ref 192–364)

## 2021-11-04 LAB — VITAMIN D 25 HYDROXY (VIT D DEFICIENCY, FRACTURES): Vit D, 25-Hydroxy: 30.5 ng/mL (ref 30.0–100.0)

## 2021-11-05 ENCOUNTER — Ambulatory Visit (INDEPENDENT_AMBULATORY_CARE_PROVIDER_SITE_OTHER): Payer: BC Managed Care – PPO | Admitting: Family Medicine

## 2021-11-05 ENCOUNTER — Other Ambulatory Visit: Payer: Self-pay

## 2021-11-05 ENCOUNTER — Encounter (INDEPENDENT_AMBULATORY_CARE_PROVIDER_SITE_OTHER): Payer: Self-pay | Admitting: Family Medicine

## 2021-11-05 VITALS — BP 126/76 | HR 68 | Temp 98.4°F | Ht 62.0 in | Wt 211.0 lb

## 2021-11-05 DIAGNOSIS — D508 Other iron deficiency anemias: Secondary | ICD-10-CM | POA: Diagnosis not present

## 2021-11-05 DIAGNOSIS — E559 Vitamin D deficiency, unspecified: Secondary | ICD-10-CM

## 2021-11-05 DIAGNOSIS — K5909 Other constipation: Secondary | ICD-10-CM

## 2021-11-05 DIAGNOSIS — Z6838 Body mass index (BMI) 38.0-38.9, adult: Secondary | ICD-10-CM

## 2021-11-05 DIAGNOSIS — E7849 Other hyperlipidemia: Secondary | ICD-10-CM | POA: Diagnosis not present

## 2021-11-05 DIAGNOSIS — E669 Obesity, unspecified: Secondary | ICD-10-CM

## 2021-11-05 DIAGNOSIS — Z6841 Body Mass Index (BMI) 40.0 and over, adult: Secondary | ICD-10-CM

## 2021-11-05 DIAGNOSIS — I10 Essential (primary) hypertension: Secondary | ICD-10-CM | POA: Diagnosis not present

## 2021-11-05 DIAGNOSIS — R7303 Prediabetes: Secondary | ICD-10-CM | POA: Diagnosis not present

## 2021-11-05 DIAGNOSIS — Z9189 Other specified personal risk factors, not elsewhere classified: Secondary | ICD-10-CM

## 2021-11-05 MED ORDER — VITAMIN D (ERGOCALCIFEROL) 1.25 MG (50000 UNIT) PO CAPS: ORAL_CAPSULE | ORAL | 0 refills | Status: DC

## 2021-11-05 MED ORDER — LINACLOTIDE 145 MCG PO CAPS: 145.0000 ug | ORAL_CAPSULE | Freq: Every day | ORAL | 0 refills | Status: DC

## 2021-11-05 MED ORDER — METFORMIN HCL 500 MG PO TABS: 500.0000 mg | ORAL_TABLET | Freq: Two times a day (BID) | ORAL | 0 refills | Status: DC

## 2021-11-05 MED ORDER — ROSUVASTATIN CALCIUM 10 MG PO TABS: ORAL_TABLET | ORAL | 0 refills | Status: DC

## 2021-11-05 MED ORDER — SEMAGLUTIDE (1 MG/DOSE) 4 MG/3ML ~~LOC~~ SOPN: 1.0000 mg | PEN_INJECTOR | SUBCUTANEOUS | 0 refills | Status: DC

## 2021-11-06 ENCOUNTER — Telehealth: Payer: Self-pay | Admitting: Internal Medicine

## 2021-11-06 NOTE — Telephone Encounter (Signed)
Pt states when attempting to schedule an appt w/ [facility] she was informed she did not have a referral, informed pt referral wasplaced on 1-20  Contacted Mary in referrals [facility] confirmed  referral was placed on 1-20, Stanton Kidney states she will call [employer]

## 2021-11-09 ENCOUNTER — Encounter: Payer: Self-pay | Admitting: Physical Therapy

## 2021-11-09 ENCOUNTER — Ambulatory Visit: Payer: BC Managed Care – PPO | Attending: Family | Admitting: Physical Therapy

## 2021-11-09 ENCOUNTER — Other Ambulatory Visit: Payer: Self-pay

## 2021-11-09 VITALS — BP 130/87

## 2021-11-09 DIAGNOSIS — N393 Stress incontinence (female) (male): Secondary | ICD-10-CM | POA: Insufficient documentation

## 2021-11-09 DIAGNOSIS — R2689 Other abnormalities of gait and mobility: Secondary | ICD-10-CM | POA: Insufficient documentation

## 2021-11-09 DIAGNOSIS — R278 Other lack of coordination: Secondary | ICD-10-CM | POA: Insufficient documentation

## 2021-11-09 NOTE — Therapy (Signed)
[place] MAIN [facility] SERVICES [address] [place], [place], [address] Phone: [phone]   Fax:  [phone]  Patient Details  Name: Molly Bean MRN: [id number] Date of Birth: 1960/12/12 Referring Provider:  Jaquita Folds, *  Encounter Date: 11/09/2021  Deferred PT today because pt reported a HA at [place] and had not eaten anything nor drank any fluids. Pt's BP was 131/87 mm Hg which is her typical BP. This is the same area where she typically has HA. Pt was provided two small cups of water which pt drank. Advised pt to make sure she eats before coming to therapy and to go eat brunch after leaving clinic.  Pt voiced understanding.  Jerl Mina, PT 11/09/2021, 10:27 AM  [place] MAIN [facility] SERVICES [address] [place], [place], [address] Phone: [phone]   Fax:  [phone]

## 2021-11-09 NOTE — Progress Notes (Signed)
Chief Complaint:   OBESITY Molly Bean is here to discuss her progress with her obesity treatment plan along with follow-up of her obesity related diagnoses. Molly Bean is on the Category 1 Plan and states she is following her eating plan approximately 90% of the time. Molly Bean states she is exercising 30 minutes 6 times per week.  Today's visit was #: 24 Starting weight: 240 lbs Starting date: 04/15/2020 Today's weight: 211 lbs Today's date: 11/05/2021 Total lbs lost to date: 29 Total lbs lost since last in-office visit: 1  Interim History: Molly Bean is here for a follow up office visit.  We reviewed her meal plan and questions were answered.  Patient's food recall appears to be accurate and consistent with what is on plan when she is following it.   When eating on plan, her hunger and cravings are well controlled.    Subjective:   1. Pre-diabetes Discussed labs with patient today. Ozempic works well with decreased appetite and cravings.  2. Essential hypertension Discussed labs with patient today. At goal. Medication: Maxzide  3. Other hyperlipidemia Discussed labs with patient today. We increased pt's dose of Crestor on 08/04/2021. Dejanee is tolerating medication(s) well without side effects.  Medication compliance is good and patient appears to be taking it as prescribed.  The patient denies additional concerns regarding this condition.   4. Other iron deficiency anemia Discussed labs with patient today. Pt states she "feels great". She denies fatigue or concerns.   5. Vitamin D deficiency Discussed labs with patient today. Medication has been good. She denies issues and is not sure why her Vit D level dropped this time, if she is taking meds as prescribed.  6. Other constipation Pt denies issues. She is tolerating Linzess well and states she needs it to remain regular.  7. At risk for impaired metabolic function Cheron is at increased risk for impaired  metabolic function due to current nutrition and muscle mass.  Assessment/Plan:  No orders of the defined types were placed in this encounter.   Medications Discontinued During This Encounter  Medication Reason   metFORMIN (GLUCOPHAGE) 500 MG tablet Reorder   rosuvastatin (CRESTOR) 10 MG tablet Reorder   Vitamin D, Ergocalciferol, (DRISDOL) 1.25 MG (50000 UNIT) CAPS capsule Reorder   linaclotide (LINZESS) 145 MCG CAPS capsule Reorder   Semaglutide, 1 MG/DOSE, 4 MG/3ML SOPN Reorder     Meds ordered this encounter  Medications   linaclotide (LINZESS) 145 MCG CAPS capsule    Sig: Take 1 capsule (145 mcg total) by mouth daily before breakfast.    Dispense:  30 capsule    Refill:  0   metFORMIN (GLUCOPHAGE) 500 MG tablet    Sig: Take 1 tablet (500 mg total) by mouth 2 (two) times daily with a meal.    Dispense:  60 tablet    Refill:  0    Ov for rf   rosuvastatin (CRESTOR) 10 MG tablet    Sig: TAKE 1 TABLET BY MOUTH EVERYDAY AT BEDTIME    Dispense:  30 tablet    Refill:  0   Semaglutide, 1 MG/DOSE, 4 MG/3ML SOPN    Sig: Inject 1 mg as directed once a week.    Dispense:  3 mL    Refill:  0    One mo supply; ov for rf   Vitamin D, Ergocalciferol, (DRISDOL) 1.25 MG (50000 UNIT) CAPS capsule    Sig: 1 po q wed, and 1 po q sun    Dispense:  8 capsule    Refill:  0    Ov for rf     1. Pre-diabetes Slowly improving.  Molly Bean will continue to work on weight loss, exercise, and decreasing simple carbohydrates to help decrease the risk of diabetes. Continue current treatment plan.  Refill- metFORMIN (GLUCOPHAGE) 500 MG tablet; Take 1 tablet (500 mg total) by mouth 2 (two) times daily with a meal.  Dispense: 60 tablet; Refill: 0 Refill- Semaglutide, 1 MG/DOSE, 4 MG/3ML SOPN; Inject 1 mg as directed once a week.  Dispense: 3 mL; Refill: 0  2. Essential hypertension BP at goal today. Continue Maxzide. - Counseled Molly Bean on pathophysiology of disease and discussed treatment  plan, which always includes dietary and lifestyle modification as first line.  - Lifestyle changes such as following our low salt, heart healthy meal plan and engaging in a regular exercise program discussed  - Avoid buying foods that are: processed, frozen, or prepackaged to avoid excess salt. - Ambulatory blood pressure monitoring encouraged.  Reminded patient that if they ever feel poorly in any way, to check their blood pressure and pulse as well. - We will continue to monitor closely alongside PCP/ specialists.  Pt reminded to also f/up with those individuals as instructed by them.  - We will continue to monitor symptoms as they relate to the her weight loss journey.  3. Other hyperlipidemia Slightly improved FLP. HDL increased to >60. Liver enzymes within normal limits.  Refill- rosuvastatin (CRESTOR) 10 MG tablet; TAKE 1 TABLET BY MOUTH EVERYDAY AT BEDTIME  Dispense: 30 tablet; Refill: 0  4. Other iron deficiency anemia Continue current treatment plan. Labs are stable and/or within normal limits. - Iron-rich foods include dark leafy greens, red and white meats, eggs, seafood, and beans   - Vit C can help enhance the absorption of iron - Certain foods and drinks prevent your body from absorbing iron properly.  Avoid eating these foods in the same meal as iron-rich foods or with iron supplements.  These foods include: coffee, black tea, and red wine; milk, dairy products, and foods that are high in calcium; beans and soybeans; whole grains.  - Educated pt that constipation can be a side effect of iron supplementation.  Increased water and fiber intake are helpful.  Water goal: 1/2 of pt's weight in ounces of water per day unless told otherwise by a healthcare provider.  Fiber goal: > 25 grams/day.   5. Vitamin D deficiency Not at goal but continue twice weekly dose.  Refill- Vitamin D, Ergocalciferol, (DRISDOL) 1.25 MG (50000 UNIT) CAPS capsule; 1 po q wed, and 1 po q sun  Dispense: 8  capsule; Refill: 0  6. Other constipation Counseling done on proper hydration. We discussed preventative OTC therapies that exist such as MiraLAX, stool softeners, etc.  We also discussed the importance of adequate fiber intake and for patient to drink 1/2 weight in ounces of water per day, unless told otherwise by a Cardiologist, Nephrologist, or another medical provider.     Refill- linaclotide (LINZESS) 145 MCG CAPS capsule; Take 1 capsule (145 mcg total) by mouth daily before breakfast.  Dispense: 30 capsule; Refill: 0  7. At risk for impaired metabolic function Due to Molly Bean's current state of health and medical condition(s), she is at a significantly higher risk for impaired metabolic function.   At least 10 minutes was spent on counseling Molly Bean about these concerns today.  This places the patient at a much greater risk to subsequently develop cardio-pulmonary conditions  that can negatively affect the patient's quality of life.  I stressed the importance of reversing these risks factors.  The initial goal is to lose at least 5-10% of starting weight to help reduce risk factors.  Counseling:  Intensive lifestyle modifications discussed with Raley as the most appropriate first line treatment.  she will continue to work on diet, exercise, and weight loss efforts.  We will continue to reassess these conditions on a fairly regular basis in an attempt to decrease the patient's overall morbidity and mortality.  8. Obesity with current BMI of 38.6 Kynlee is currently in the action stage of change. As such, her goal is to continue with weight loss efforts. She has agreed to the Category 1 Plan.   Exercise goals:  As is- Pt desires to increase to 60 minutes 5-6 days a week, like prior.  Behavioral modification strategies: meal planning and cooking strategies and planning for success.  Molly Bean has agreed to follow-up with our clinic in 3-4 weeks. She was informed of the importance of frequent  follow-up visits to maximize her success with intensive lifestyle modifications for her multiple health conditions.   Objective:   Blood pressure 126/76, pulse 68, temperature 98.4 F (36.9 C), height 5\' 2"  (1.575 m), weight 211 lb (95.7 kg), SpO2 99 %. Body mass index is 38.59 kg/m.  General: Cooperative, alert, well developed, in no acute distress. HEENT: Conjunctivae and lids unremarkable. Cardiovascular: Regular rhythm.  Lungs: Normal work of breathing. Neurologic: No focal deficits.   Lab Results  Component Value Date   CREATININE 0.89 11/03/2021   BUN 8 11/03/2021   NA 142 11/03/2021   K 4.4 11/03/2021   CL 102 11/03/2021   CO2 25 11/03/2021   Lab Results  Component Value Date   ALT 10 11/03/2021   AST 15 11/03/2021   ALKPHOS 90 11/03/2021   BILITOT 0.3 11/03/2021   Lab Results  Component Value Date   HGBA1C 5.7 (H) 11/03/2021   HGBA1C 5.9 (H) 07/01/2021   HGBA1C 6.0 (H) 03/23/2021   HGBA1C 6.2 (H) 12/30/2020   HGBA1C 6.1 (H) 08/19/2020   Lab Results  Component Value Date   INSULIN 22.5 11/03/2021   INSULIN 21.1 03/23/2021   INSULIN 21.9 04/15/2020   Lab Results  Component Value Date   TSH 1.100 04/15/2020   Lab Results  Component Value Date   CHOL 170 11/03/2021   HDL 62 11/03/2021   LDLCALC 97 11/03/2021   LDLDIRECT 95 12/30/2020   TRIG 58 11/03/2021   CHOLHDL 2.9 07/01/2021   Lab Results  Component Value Date   VD25OH 30.5 11/03/2021   VD25OH 47.7 05/20/2021   VD25OH 46.9 12/30/2020   Lab Results  Component Value Date   WBC 6.4 11/03/2021   HGB 12.4 11/03/2021   HCT 40.8 11/03/2021   MCV 69 (L) 11/03/2021   PLT 235 11/03/2021   Lab Results  Component Value Date   IRON 63 11/03/2021   TIBC 332 11/03/2021   FERRITIN 72 11/03/2021   Attestation Statements:   Reviewed by clinician on day of visit: allergies, medications, problem list, medical history, surgical history, family history, social history, and previous encounter  notes.  Coral Ceo, CMA, am acting as transcriptionist for [employer], DO.  I have reviewed the above documentation for accuracy and completeness, and I agree with the above. Marjory Sneddon, D.O.  The [place] was signed into law in 2016 which includes the topic of electronic health  records.  This provides immediate access to information in MyChart.  This includes consultation notes, operative notes, office notes, lab results and pathology reports.  If you have any questions about what you read please let us know at your next visit so we can discuss your concerns and take corrective action if need be.  We are right here with you.

## 2021-11-17 ENCOUNTER — Ambulatory Visit: Payer: BC Managed Care – PPO | Admitting: Physical Therapy

## 2021-11-17 ENCOUNTER — Other Ambulatory Visit: Payer: Self-pay

## 2021-11-17 DIAGNOSIS — R2689 Other abnormalities of gait and mobility: Secondary | ICD-10-CM | POA: Diagnosis present

## 2021-11-17 DIAGNOSIS — N393 Stress incontinence (female) (male): Secondary | ICD-10-CM | POA: Diagnosis present

## 2021-11-17 DIAGNOSIS — R278 Other lack of coordination: Secondary | ICD-10-CM

## 2021-11-17 NOTE — Therapy (Signed)
[place] MAIN [facility] SERVICES [address] [place], [place], [address] Phone: [phone]   Fax:  [phone]  Physical Therapy Treatment  Patient Details  Name: Molly Bean MRN: [id number] Date of Birth: 1961/03/25 Referring Provider (PT): Wannetta Sender MD   Encounter Date: 11/17/2021   PT End of Session - 11/17/21 0957     Visit Number 18    Date for PT Re-Evaluation 94/17/40   PN & recert 05/18/47   PT Start Time 0900    PT Stop Time 1000    PT Time Calculation (min) 60 min    Activity Tolerance Patient tolerated treatment well    Behavior During Therapy [facility] for tasks assessed/performed             Past Medical History:  Diagnosis Date   Anxiety    Constipation    DEPRESSION    edema lower extremeties    GERD    Joint pain    MENOPAUSAL SYNDROME    Multiple food allergies    OBESITY    Shortness of breath    Sleep apnea    wears CPAP    Past Surgical History:  Procedure Laterality Date   ABDOMINAL HYSTERECTOMY  11/1998   total fibroids   CESAREAN SECTION      There were no vitals filed for this visit.   Subjective Assessment - 11/17/21 0905     Subjective Pt is no longer taking taking OAB medications because she no longer has side effects. Pt notices her leakage is not worse    Pertinent History C-section, abdominal hysterectomy    Patient Stated Goals decrease leakage, have sex and get through pelvic exams without pain                [facility] PT Assessment - 11/17/21 0954       Palpation   Palpation comment tightness at medial ischial rami R, hamstring/lateral leg, intrinsic foot mm with limited toe abduction/ EV ( post Tx: decreased tenderness at medial ischial rami R)                        Pelvic Floor Special Questions - 11/17/21 0907     Pelvic Floor Internal Exam pt consented verbally and had no contraindications    Exam Type Vaginal    Palpation tenderness at obt int R with  tightness, less tenderness and tightness at anterior attachment of puborectalis mm B, no lowered position of urethra/ bladder,    Strength fair squeeze, definite lift   no able to progress with quick contractions due to pain              [facility] Adult PT Treatment/Exercise - 11/17/21 0955       Therapeutic Activites    Other Therapeutic Activities advised to wear toe spreader 30 min a day to spread toes to minimize pelvic floor tightness      Neuro Re-ed    Neuro Re-ed Details  cued for toe aduction B      Modalities   Modalities Moist Heat      Moist Heat Therapy   Number Minutes Moist Heat 15 Minutes    Moist Heat Location --   perineum , while pt receiving manual Tx at lower kinetic chain     Manual Therapy   Manual therapy comments STM/MWM at leg, ankle, foot to promote DF/EV, toe abduction and decrease pelvic floor mm tightness    Internal Pelvic Floor  STM/MWM at obt int R , deferred to lower kinetic chain Tx due to pain and not able to tolerate further internal Tx                          PT Long Term Goals - 11/17/21 0958       PT LONG TERM GOAL #1   Title Pt will demo increased hip abduction strength from 3/5 to >4/5 in order to maintain walking routine and pelvic stability    Time 8    Period Weeks    Status On-going      PT LONG TERM GOAL #2   Title Pt will demo proper deepcore coordination and pelvic floor movement with no cues to progress to kegel exercises    Time 4    Period Weeks    Status Achieved      PT LONG TERM GOAL #3   Title Pt will demo decreased abdominal scar restrictions to advance to deep core exercises and promote IAP for continence and bowel movement elimination with less dependence on Linzess    Time 4    Period Weeks    Status Achieved      PT LONG TERM GOAL #4   Title Pt will increase FOTO score for constipation from 41 pts and Urinary 47 pts to > 55 pts in order to demo improved pelvic floor function  ( 06/15/21: no  change urinary, 2 pt change improvement  with bowel function,  09/11/21: did not administer, plan for next session )    Time 10    Period Weeks    Status On-going      PT LONG TERM GOAL #5   Title Pt will demo decreased pelvic floor mm tightness and proper relaxation of mm to have less pain with intercourcse and gynecological exams    Time 10    Period Weeks    Status Achieved      PT LONG TERM GOAL #6   Title Pt will demo no pelvic floor tightness/ tendernessacorss 2 sessions, demo proper quick contractions > 5 reps and long holds 3sec, 5 reps without cues ino rder to gain continence    Time 10    Period Weeks    Status On-going    Target Date 11/20/21                   Plan - 11/17/21 0957     Clinical Impression Statement Pt demo'd significantly less pelvic floor mm tightness and also no more lowered pelvic organs. Pt still had minor areas of tightness at deeper R pelvic floor. Internal pelvic floor Tx was discontinued at the point when pt reported pain. Deferred to external manunal Tx to address associated lower kinetic chain mm tightness/ limited mobility. Pt reported nomore tenderness at medila ischial rami mm attachments after Tx at foot/ankle/hamstring. Anticipate pt will tolerate internal Tx after lower kinetic chain Tx are applied at next session. Plan to add kegel strengthening only after pt reports no more tenderness/ pain and no more tightness at pelvic floor. Pt continues to benefit from skilled PT   Examination-Activity Limitations Toileting;Continence;Other    Stability/Clinical Decision Making Evolving/Moderate complexity    Rehab Potential Good    PT Frequency 1x / week    PT Duration Other (comment)   10   PT Treatment/Interventions Neuromuscular re-education;Cryotherapy;ADLs/Self Care Home Management;Therapeutic activities;Functional mobility training;Traction;Moist Heat;Stair training;Gait training;Patient/family education;Taping;Manual techniques;Scar  mobilization;Balance training;Therapeutic exercise;Spinal Manipulations;Joint Manipulations;Energy conservation  Consulted and Agree with Plan of Care Patient             Patient will benefit from skilled therapeutic intervention in order to improve the following deficits and impairments:  Decreased endurance, Decreased safety awareness, Difficulty walking, Decreased coordination, Abnormal gait, Improper body mechanics, Pain, Increased muscle spasms, Hypermobility, Hypomobility, Decreased scar mobility, Decreased mobility, Decreased strength, Postural dysfunction  Visit Diagnosis: Stress incontinence of urine  Other abnormalities of gait and mobility  Other lack of coordination     Problem List Patient Active Problem List   Diagnosis Date Noted   Axillary hidradenitis suppurativa 07/07/2021   Proteus mirabilis infection 01/16/2021   Cutaneous abscess of right axilla 01/13/2021   Iron deficiency anemia 12/10/2020   Prediabetes 08/06/2020   Chronic constipation 08/06/2020   Vitamin D deficiency 08/06/2020   Vaginitis 04/09/2020   Essential hypertension 05/01/2018   Asthma 03/14/2018   Hidradenitis suppurativa 01/18/2018   GAD (generalized anxiety disorder) 10/11/2016   MENOPAUSAL SYNDROME 04/23/2009   OBESITY 02/15/2008   Gastroesophageal reflux disease 04/12/2007    Jerl Mina, PT 11/17/2021, 10:02 AM  [place] MAIN [facility] SERVICES [address] [place], [place], [address] Phone: [phone]   Fax:  [phone]  Name: Molly Bean MRN: [id number] Date of Birth: 17-Oct-1960

## 2021-11-25 ENCOUNTER — Ambulatory Visit: Payer: BC Managed Care – PPO | Admitting: Physical Therapy

## 2021-11-25 ENCOUNTER — Other Ambulatory Visit: Payer: Self-pay

## 2021-11-25 DIAGNOSIS — N393 Stress incontinence (female) (male): Secondary | ICD-10-CM | POA: Diagnosis not present

## 2021-11-25 DIAGNOSIS — R2689 Other abnormalities of gait and mobility: Secondary | ICD-10-CM

## 2021-11-25 DIAGNOSIS — R278 Other lack of coordination: Secondary | ICD-10-CM

## 2021-11-25 NOTE — Patient Instructions (Signed)
Happy Baby     Mermaid stretch  Rocking while seated on the floor with heels to one side of the hip Heels to one side of the hip  Rock forward towards the knee that is bent , rock beck towards the opposite sitting bones  ___  Walking with lifting knees higher, lower ballmounds and spread toes  No more walking on outside of feet and toe clawing

## 2021-11-25 NOTE — Therapy (Addendum)
[place] MAIN [facility] SERVICES [address] [place], [place], [address] Phone: [phone]   Fax:  [phone]  Physical Therapy Treatment  Patient Details  Name: Molly Bean MRN: [id number] Date of Birth: [birth year] Referring Provider (PT): Wannetta Sender MD   Encounter Date: 11/25/2021   PT End of Session - 11/25/21 0854     Visit Number 19    Date for PT Re-Evaluation 57/01/77   PN & recert 9/39/03   PT Start Time 0755    PT Stop Time 0854    PT Time Calculation (min) 59 min    Activity Tolerance Patient tolerated treatment well    Behavior During Therapy [facility] for tasks assessed/performed             Past Medical History:  Diagnosis Date   Anxiety    Constipation    DEPRESSION    edema lower extremeties    GERD    Joint pain    MENOPAUSAL SYNDROME    Multiple food allergies    OBESITY    Shortness of breath    Sleep apnea    wears CPAP    Past Surgical History:  Procedure Laterality Date   ABDOMINAL HYSTERECTOMY  11/1998   total fibroids   CESAREAN SECTION      There were no vitals filed for this visit.   Subjective Assessment - 11/25/21 1013     Subjective Pt reports her feet feel alot better after last session.    Pertinent History C-section, abdominal hysterectomy    Patient Stated Goals decrease leakage, have sex and get through pelvic exams without pain                [facility] PT Assessment - 11/25/21 0903       Palpation   Palpation comment limited DF/ toe abduction, L lateral foot                        Pelvic Floor Special Questions - 11/25/21 0902     Pelvic Floor Internal Exam pt consented verbally and had no contraindications    Exam Type Vaginal    Palpation tenderness /tightness at  L anteiror and obt int, urethra/ bladder no longer lowered    Strength fair squeeze, definite lift   L side limited in mobility   Strength # of reps --   improved upward lift post Tx               [facility] Adult PT Treatment/Exercise - 11/25/21 0900       Modalities   Modalities Moist Heat      Moist Heat Therapy   Number Minutes Moist Heat 5 Minutes    Moist Heat Location --   perineum, in cupported childs pose for optimal lengthening, (unbilled)     Manual Therapy   Manual therapy comments STM/MWM PA/AP mob II , distraction at L lateral foot to promote DF/EV    Internal Pelvic Floor STM/MWM at L anterior and obt int to promote more mobility                          PT Long Term Goals - 11/25/21 0855       PT LONG TERM GOAL #1   Title Pt will demo increased hip abduction strength from 3/5 to >4/5 in order to maintain walking routine and pelvic stability    Time 8    Period  Weeks    Status On-going    Target Date 02/03/22      PT LONG TERM GOAL #2   Title Pt will demo proper deepcore coordination and pelvic floor movement with no cues to progress to kegel exercises    Time 4    Period Weeks    Status Achieved    Target Date 02/03/22      PT LONG TERM GOAL #3   Title Pt will demo decreased abdominal scar restrictions to advance to deep core exercises and promote IAP for continence and bowel movement elimination with less dependence on Linzess    Time 4    Period Weeks    Status Achieved    Target Date 02/03/22      PT LONG TERM GOAL #4   Title Pt will increase FOTO score for constipation from 41 pts and Urinary 47 pts to > 55 pts in order to demo improved pelvic floor function  ( 06/15/21: no change urinary, 2 pt change improvement  with bowel function,  11/25/21: neg scores of change, need to readminster because pt has reported improvements  )    Time 10    Period Weeks    Status On-going      PT LONG TERM GOAL #5   Title Pt will demo decreased pelvic floor mm tightness and proper relaxation of mm to have less pain with intercourcse and gynecological exams    Time 10    Period Weeks    Status Achieved    Target Date 02/03/22      PT  LONG TERM GOAL #6   Title Pt will demo no pelvic floor tightness/ tendernessacorss 2 sessions, demo proper quick contractions > 5 reps and long holds 3sec, 5 reps without cues ino rder to gain continence    Time 10    Period Weeks    Status Partially Met    Target Date 02/03/22                   Plan - 11/25/21 0855     Clinical Impression Statement Pt has achieved 3/6 goals and progressing well towards remaining goals. Pt has gained more SIJ mobility and better posture. Pt is shown signficatly decreased pelvic floor mm tightness and starting to show more mobility for proper lengthening and upward movement of pelvic floor. Pt showed less lowered position of urethra and bladder. Today, pt required continued manual Tx to address L sided pelvic floor mm tightness which likely related to the presentation of tight and hypomobile L foot. Modified pressure and manual technique when pt voiced pain and tenderness to ensure comfort. L foot was also addressed to minimize overactivity of pelvic floor. Provided cues for walking with more hip/ knee flexion to increase DF.  Plan to discuss shoe wear and integrate quick contraction of pelvic floor at next session.  Pt continues to benefit from skilled PT to achieve remaining goals.    Examination-Activity Limitations Toileting;Continence;Other   Stability/Clinical Decision Making Evolving/Moderate complexity    Rehab Potential Good    PT Frequency 1x / week    PT Duration Other (comment)   10   PT Treatment/Interventions Neuromuscular re-education;Cryotherapy;ADLs/Self Care Home Management;Therapeutic activities;Functional mobility training;Traction;Moist Heat;Stair training;Gait training;Patient/family education;Taping;Manual techniques;Scar mobilization;Balance training;Therapeutic exercise;Spinal Manipulations;Joint Manipulations;Energy conservation    Consulted and Agree with Plan of Care Patient             Patient will benefit from skilled  therapeutic intervention in order to improve the  following deficits and impairments:  Decreased endurance, Decreased safety awareness, Difficulty walking, Decreased coordination, Abnormal gait, Improper body mechanics, Pain, Increased muscle spasms, Hypermobility, Hypomobility, Decreased scar mobility, Decreased mobility, Decreased strength, Postural dysfunction  Visit Diagnosis: Stress incontinence of urine  Other abnormalities of gait and mobility  Other lack of coordination     Problem List Patient Active Problem List   Diagnosis Date Noted   Axillary hidradenitis suppurativa 07/07/2021   Proteus mirabilis infection 01/16/2021   Cutaneous abscess of right axilla 01/13/2021   Iron deficiency anemia 12/10/2020   Prediabetes 08/06/2020   Chronic constipation 08/06/2020   Vitamin D deficiency 08/06/2020   Vaginitis 04/09/2020   Essential hypertension 05/01/2018   Asthma 03/14/2018   Hidradenitis suppurativa 01/18/2018   GAD (generalized anxiety disorder) 10/11/2016   MENOPAUSAL SYNDROME 04/23/2009   OBESITY 02/15/2008   Gastroesophageal reflux disease 04/12/2007    Jerl Mina, PT 11/25/2021, 10:21 AM  [place] MAIN [facility] SERVICES [address] [place], [place], [address] Phone: [phone]   Fax:  [phone]  Name: Tranika Scholler MRN: [id number] Date of Birth: 1960-12-28

## 2021-12-02 ENCOUNTER — Other Ambulatory Visit: Payer: Self-pay

## 2021-12-02 ENCOUNTER — Ambulatory Visit: Payer: BC Managed Care – PPO | Attending: Family | Admitting: Physical Therapy

## 2021-12-02 DIAGNOSIS — R2689 Other abnormalities of gait and mobility: Secondary | ICD-10-CM | POA: Insufficient documentation

## 2021-12-02 DIAGNOSIS — N393 Stress incontinence (female) (male): Secondary | ICD-10-CM | POA: Diagnosis not present

## 2021-12-02 DIAGNOSIS — R278 Other lack of coordination: Secondary | ICD-10-CM | POA: Diagnosis present

## 2021-12-02 NOTE — Therapy (Signed)
[place] MAIN [facility] SERVICES [address] [place], [place], [address] Phone: [phone]   Fax:  [phone]  Physical Therapy Treatment/ Progress Note reporting across 10 visits from 06/15/21 to 12/02/21  Patient Details  Name: Molly Bean MRN: [id number] Date of Birth: [birth year] Referring Provider (PT): Wannetta Sender MD   Encounter Date: 12/02/2021   PT End of Session - 12/02/21 0859     Visit Number 20    Date for PT Re-Evaluation 88/28/00   PN & recert 3/49/17   PT Start Time 0809    PT Stop Time 0902    PT Time Calculation (min) 53 min    Activity Tolerance Patient tolerated treatment well    Behavior During Therapy [facility] for tasks assessed/performed             Past Medical History:  Diagnosis Date   Anxiety    Constipation    DEPRESSION    edema lower extremeties    GERD    Joint pain    MENOPAUSAL SYNDROME    Multiple food allergies    OBESITY    Shortness of breath    Sleep apnea    wears CPAP    Past Surgical History:  Procedure Laterality Date   ABDOMINAL HYSTERECTOMY  11/1998   total fibroids   CESAREAN SECTION      There were no vitals filed for this visit.   Subjective Assessment - 12/02/21 0811     Subjective Pt has been walking in the evenings again and she noticed cramping along side of her legs. Pt wore different sandals while walking. Pt noticed she was able to squeeze and stop her urine flow while peeing.     Pertinent History C-section, abdominal hysterectomy    Patient Stated Goals decrease leakage, have sex and get through pelvic exams without pain                [facility] PT Assessment - 12/02/21 0812       Palpation   Palpation comment L intrinisc foot mm, peroneal longus/ brevis, medial mm attachments to knee  tighter than R. toe abduction more present but lateral midjoints hypomobile      Ambulation/Gait   Gait Comments increased hip flexion. knee flexion, toe abduction                         Pelvic Floor Special Questions - 12/02/21 0001     External Palpation tightness at attchments at ischial rami/ tuberosity L / adductors tightness               [facility] Adult PT Treatment/Exercise - 12/02/21 0812       Therapeutic Activites    Other Therapeutic Activities discussed proper shoe and sandal wear and provided recommendations ,  Explained to not practice stopping her urine flow as an exercise and to allow urine to just flow and then pelvic floor exercises will be performed not when on the toilet. Still holding off on kegel strengthening program until she no longer shows tightness of pelvic floor mm.        Neuro Re-ed    Neuro Re-ed Details  cued to stronger push off      Modalities   Modalities Moist Heat      Moist Heat Therapy   Number Minutes Moist Heat 5 Minutes    Moist Heat Location --   lateral leg/ feet B , knees supported  Manual Therapy   Manual therapy comments STM/MWM at problem areas noted at B legs/ foot, L adductor/ attachments to ischial rami L                          PT Long Term Goals - 12/02/21 0859       PT LONG TERM GOAL #1   Title Pt will demo increased hip abduction strength from 3/5 to >4/5 in order to maintain walking routine and pelvic stability    Time 8    Period Weeks    Status Achieved    Target Date 02/03/22      PT LONG TERM GOAL #2   Title Pt will demo proper deepcore coordination and pelvic floor movement with no cues to progress to kegel exercises    Time 4    Period Weeks    Status Achieved    Target Date 02/03/22      PT LONG TERM GOAL #3   Title Pt will demo decreased abdominal scar restrictions to advance to deep core exercises and promote IAP for continence and bowel movement elimination with less dependence on Linzess    Time 4    Period Weeks    Status Achieved    Target Date 02/03/22      PT LONG TERM GOAL #4   Title Pt will increase FOTO score for constipation  from 41 pts and Urinary 47 pts to > 55 pts in order to demo improved pelvic floor function  ( 06/15/21: no change urinary, 2 pt change improvement  with bowel function,  11/25/21: neg scores of change, need to readminster because pt has reported improvements  )    Time 10    Period Weeks    Status On-going      PT LONG TERM GOAL #5   Title Pt will demo decreased pelvic floor mm tightness and proper relaxation of mm to have less pain with intercourcse and gynecological exams    Time 10    Period Weeks    Status Achieved    Target Date 02/03/22      PT LONG TERM GOAL #6   Title Pt will demo no pelvic floor tightness/ tendernessacorss 2 sessions, demo proper quick contractions > 5 reps and long holds 3sec, 5 reps without cues ino rder to gain continence    Time 10    Period Weeks    Status Partially Met    Target Date 02/03/22                   Plan - 12/02/21 0900     Clinical Impression Statement Pt has achieved 4/6 goals and progressing well towards remaining goals. Across the past 10 visits, pt has the  following milestones of improvements:  - bladder position no longer lowered - increased hip abduction strength -increased SIJ mobility - increased deep core strength and coordination and no longer pushing downward on pelvic floor  Pt is still requiring skilled PT to minimize pelvic floor mm tightness and increase mobility in her lower kinetic chain for improved gait as pt enjoys walking for exercise. Anticipate as lower kinetic chain deficits improve ( less genu valgus/ hyperextended knees, increased feet mobility/ DF/EV, toe abduction B) , pt will decrease risk for tight pelvic floor mm long term.    Today, discussed proper shoe and sandal wear and provided recommendations.  Explained to not practice stopping her urine flow even though she noticed  this was something she had not been able to do before. Stopping urine flow is not an exercise to do and please allow urine to just  flow and that pelvic floor exercises will be performed not when on the toilet. Pt is still not ready for pelvic floor exercises yet. Kegel strengthening program is only appropriate when she no longer shows tightness of pelvic floor mm because tightness sof pelvic floor mm can be contributing factor to leakage and thath lengthening of pelvic floor must come first  before strengthening.    Pt continues to benefit from skilled PT to achieve remaining goals.     Examination-Activity Limitations Toileting;Continence;Other    Stability/Clinical Decision Making Evolving/Moderate complexity    Rehab Potential Good    PT Frequency 1x / week    PT Duration Other (comment)   10   PT Treatment/Interventions Neuromuscular re-education;Cryotherapy;ADLs/Self Care Home Management;Therapeutic activities;Functional mobility training;Traction;Moist Heat;Stair training;Gait training;Patient/family education;Taping;Manual techniques;Scar mobilization;Balance training;Therapeutic exercise;Spinal Manipulations;Joint Manipulations;Energy conservation    Consulted and Agree with Plan of Care Patient             Patient will benefit from skilled therapeutic intervention in order to improve the following deficits and impairments:  Decreased endurance, Decreased safety awareness, Difficulty walking, Decreased coordination, Abnormal gait, Improper body mechanics, Pain, Increased muscle spasms, Hypermobility, Hypomobility, Decreased scar mobility, Decreased mobility, Decreased strength, Postural dysfunction  Visit Diagnosis: Stress incontinence of urine  Other abnormalities of gait and mobility  Other lack of coordination     Problem List Patient Active Problem List   Diagnosis Date Noted   Axillary hidradenitis suppurativa 07/07/2021   Proteus mirabilis infection 01/16/2021   Cutaneous abscess of right axilla 01/13/2021   Iron deficiency anemia 12/10/2020   Prediabetes 08/06/2020   Chronic constipation  08/06/2020   Vitamin D deficiency 08/06/2020   Vaginitis 04/09/2020   Essential hypertension 05/01/2018   Asthma 03/14/2018   Hidradenitis suppurativa 01/18/2018   GAD (generalized anxiety disorder) 10/11/2016   MENOPAUSAL SYNDROME 04/23/2009   OBESITY 02/15/2008   Gastroesophageal reflux disease 04/12/2007    Jerl Mina, PT 12/02/2021, 11:51 AM  [place] [address] [place], [place], [address] Phone: [phone]   Fax:  [phone]  Name: Molly Bean MRN: [id number] Date of Birth: 1961-09-01

## 2021-12-07 ENCOUNTER — Ambulatory Visit (INDEPENDENT_AMBULATORY_CARE_PROVIDER_SITE_OTHER): Payer: BC Managed Care – PPO | Admitting: Family Medicine

## 2021-12-07 ENCOUNTER — Other Ambulatory Visit: Payer: Self-pay

## 2021-12-07 ENCOUNTER — Encounter (INDEPENDENT_AMBULATORY_CARE_PROVIDER_SITE_OTHER): Payer: Self-pay | Admitting: Family Medicine

## 2021-12-07 VITALS — BP 104/68 | HR 78 | Temp 98.4°F | Ht 62.0 in | Wt 211.0 lb

## 2021-12-07 DIAGNOSIS — J45909 Unspecified asthma, uncomplicated: Secondary | ICD-10-CM

## 2021-12-07 DIAGNOSIS — E559 Vitamin D deficiency, unspecified: Secondary | ICD-10-CM | POA: Diagnosis not present

## 2021-12-07 DIAGNOSIS — F39 Unspecified mood [affective] disorder: Secondary | ICD-10-CM

## 2021-12-07 DIAGNOSIS — E7849 Other hyperlipidemia: Secondary | ICD-10-CM | POA: Diagnosis not present

## 2021-12-07 DIAGNOSIS — R7303 Prediabetes: Secondary | ICD-10-CM | POA: Diagnosis not present

## 2021-12-07 DIAGNOSIS — K5909 Other constipation: Secondary | ICD-10-CM

## 2021-12-07 DIAGNOSIS — E669 Obesity, unspecified: Secondary | ICD-10-CM

## 2021-12-07 DIAGNOSIS — Z6838 Body mass index (BMI) 38.0-38.9, adult: Secondary | ICD-10-CM

## 2021-12-07 DIAGNOSIS — Z9189 Other specified personal risk factors, not elsewhere classified: Secondary | ICD-10-CM

## 2021-12-07 MED ORDER — LINACLOTIDE 145 MCG PO CAPS: 145.0000 ug | ORAL_CAPSULE | Freq: Every day | ORAL | 0 refills | Status: DC

## 2021-12-07 MED ORDER — ROSUVASTATIN CALCIUM 10 MG PO TABS: ORAL_TABLET | ORAL | 0 refills | Status: DC

## 2021-12-07 MED ORDER — VITAMIN D (ERGOCALCIFEROL) 1.25 MG (50000 UNIT) PO CAPS: ORAL_CAPSULE | ORAL | 0 refills | Status: DC

## 2021-12-07 MED ORDER — ALBUTEROL SULFATE HFA 108 (90 BASE) MCG/ACT IN AERS: INHALATION_SPRAY | RESPIRATORY_TRACT | 0 refills | Status: DC

## 2021-12-07 MED ORDER — SEMAGLUTIDE (1 MG/DOSE) 4 MG/3ML ~~LOC~~ SOPN: 1.0000 mg | PEN_INJECTOR | SUBCUTANEOUS | 0 refills | Status: DC

## 2021-12-07 MED ORDER — METFORMIN HCL 500 MG PO TABS: 500.0000 mg | ORAL_TABLET | Freq: Two times a day (BID) | ORAL | 0 refills | Status: DC

## 2021-12-07 MED ORDER — BUPROPION HCL ER (SR) 150 MG PO TB12: 150.0000 mg | ORAL_TABLET | Freq: Every morning | ORAL | 0 refills | Status: DC

## 2021-12-08 NOTE — Progress Notes (Signed)
Chief Complaint:   OBESITY Molly Bean is here to discuss her progress with her obesity treatment plan along with follow-up of her obesity related diagnoses. Molly Bean is on the Category 1 Plan and states she is following her eating plan approximately 90% of the time. Molly Bean states she is walking for 60 minutes 6 times per week.  Today's visit was #: 25 Starting weight: 240 lbs Starting date: 04/15/2020 Today's weight: 211 lbs Today's date: 12/07/2021 Total lbs lost to date: 29 lbs Total lbs lost since last in-office visit: 0  Interim History: Molly Bean started walking an extra 30 minutes per day.  She is doing 3 oz of protein at night only, and at times, less.  She increased her exercise and met the goal we made at her last office visit - great job!  Subjective:   1. Pre-diabetes Molly Bean endorses great control of hunger and cravings.  2. Other hyperlipidemia Molly Bean is tolerating medication(s) well without side effects.  Medication compliance is good and patient appears to be taking it as prescribed.  Denies additional concerns regarding this condition.   3. Vitamin D deficiency Her last vitamin D level was only at 30.  She is currently taking prescription vitamin D 50,000 IU twice weekly. She denies nausea, vomiting or muscle weakness.  Lab Results  Component Value Date   VD25OH 30.5 11/03/2021   VD25OH 47.7 05/20/2021   VD25OH 46.9 12/30/2020   4. Asthma, unspecified asthma severity, unspecified whether complicated, unspecified whether persistent She uses her albuterol inhaler daily when walking.  5. Other constipation She reports stooling daily.  Normal caliber and consistency.  Denies issues.  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  She has no concerns.  Denies SI/HI.  7. At risk for side effect of medication Molly Bean is at risk for side effect of medication due to BID use of albuterol for  exercise-induced asthma.  Assessment/Plan:  No orders of the defined types were placed in this encounter.   Medications Discontinued During This Encounter  Medication Reason   buPROPion (WELLBUTRIN SR) 150 MG 12 hr tablet Reorder   albuterol (VENTOLIN HFA) 108 (90 Base) MCG/ACT inhaler Reorder   linaclotide (LINZESS) 145 MCG CAPS capsule Reorder   metFORMIN (GLUCOPHAGE) 500 MG tablet Reorder   rosuvastatin (CRESTOR) 10 MG tablet Reorder   Semaglutide, 1 MG/DOSE, 4 MG/3ML SOPN Reorder   Vitamin D, Ergocalciferol, (DRISDOL) 1.25 MG (50000 UNIT) CAPS capsule Reorder     Meds ordered this encounter  Medications   buPROPion (WELLBUTRIN SR) 150 MG 12 hr tablet    Sig: Take 1 tablet (150 mg total) by mouth in the morning.    Dispense:  30 tablet    Refill:  0    Ov for rf   linaclotide (LINZESS) 145 MCG CAPS capsule    Sig: Take 1 capsule (145 mcg total) by mouth daily before breakfast.    Dispense:  30 capsule    Refill:  0   metFORMIN (GLUCOPHAGE) 500 MG tablet    Sig: Take 1 tablet (500 mg total) by mouth 2 (two) times daily with a meal.    Dispense:  60 tablet    Refill:  0    Ov for rf   rosuvastatin (CRESTOR) 10 MG tablet    Sig: TAKE 1 TABLET BY MOUTH EVERYDAY AT BEDTIME    Dispense:  30 tablet    Refill:  0   Semaglutide, 1 MG/DOSE, 4  MG/3ML SOPN    Sig: Inject 1 mg as directed once a week.    Dispense:  3 mL    Refill:  0    One mo supply; ov for rf   Vitamin D, Ergocalciferol, (DRISDOL) 1.25 MG (50000 UNIT) CAPS capsule    Sig: 1 po q wed, and 1 po q sun    Dispense:  8 capsule    Refill:  0    Ov for rf   albuterol (VENTOLIN HFA) 108 (90 Base) MCG/ACT inhaler    Sig: TAKE 2 PUFFS BY MOUTH EVERY 6 HOURS AS NEEDED FOR WHEEZE OR SHORTNESS OF BREATH    Dispense:  8.5 g    Refill:  0     1. Pre-diabetes Molly Bean will continue to work on weight loss, exercise, and decreasing simple carbohydrates to help decrease the risk of diabetes. We will refill metformin and  Ozempic at the same doses for 1 month.  There is no need for a dose increase at this time.  - Refill metFORMIN (GLUCOPHAGE) 500 MG tablet; Take 1 tablet (500 mg total) by mouth 2 (two) times daily with a meal.  Dispense: 60 tablet; Refill: 0 - Refill Semaglutide, 1 MG/DOSE, 4 MG/3ML SOPN; Inject 1 mg as directed once a week.  Dispense: 3 mL; Refill: 0  2. Other hyperlipidemia Cardiovascular risk and specific lipid/LDL goals reviewed.  We discussed several lifestyle modifications today and Molly Bean will continue to work on diet, exercise and weight loss efforts. We will refill Crestor today.   Counseling Intensive lifestyle modifications are the first line treatment for this issue. Dietary changes: Increase soluble fiber. Decrease simple carbohydrates. Exercise changes: Moderate to vigorous-intensity aerobic activity 150 minutes per week if tolerated. Lipid-lowering medications: see documented in medical record.  - Refill rosuvastatin (CRESTOR) 10 MG tablet; TAKE 1 TABLET BY MOUTH EVERYDAY AT BEDTIME  Dispense: 30 tablet; Refill: 0  3. Vitamin D deficiency Low Vitamin D level contributes to fatigue and are associated with obesity, breast, and colon cancer. She agrees to continue to take prescription Vitamin D _0 ,000 IU twice weekly and will follow-up for routine testing of Vitamin D, at least 2-3 times per year to avoid over-replacement.  We will refill her prescription vitamin D today.  - Refill Vitamin D, Ergocalciferol, (DRISDOL) 1.25 MG (50000 UNIT) CAPS capsule; 1 po q wed, and 1 po q sun  Dispense: 8 capsule; Refill: 0  4. Asthma, unspecified asthma severity, unspecified whether complicated, unspecified whether persistent Will refill her albuterol inhaler today.  She should use her inhaler 15-20 minutes prior to exercise.  Will consider Singulair in the future as needed.  Patient will look up medication.  - Refill albuterol (VENTOLIN HFA) 108 (90 Base) MCG/ACT inhaler; TAKE 2 PUFFS BY  MOUTH EVERY 6 HOURS AS NEEDED FOR WHEEZE OR SHORTNESS OF BREATH  Dispense: 8.5 g; Refill: 0  5. Other constipation Molly Bean is tolerating medication(s) well without side effects.  Medication compliance is good and patient appears to be taking it as prescribed.  Denies additional concerns regarding this condition. We will refill Linzess 145 mc daily today.  - Refill linaclotide (LINZESS) 145 MCG CAPS capsule; Take 1 capsule (145 mcg total) by mouth daily before breakfast.  Dispense: 30 capsule; Refill: 0  6. 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  She is tolerating it well with no side effects.  Will refill Wellbutrin today.  - Refill buPROPion (WELLBUTRIN SR) 150 MG 12  hr tablet; Take 1 tablet (150 mg total) by mouth in the morning.  Dispense: 30 tablet; Refill: 0  7. At risk for side effect of medication Molly Bean was given approximately 9 minutes of drug side effect counseling today.  We discussed side effect possibility and risk versus benefits. Molly Bean agreed to the medication and will contact this office if these side effects are intolerable.  Repetitive spaced learning was employed today to elicit superior memory formation and behavioral change.   8. Obesity, current BMI 38.6  Molly Bean is currently in the action stage of change. As such, her goal is to continue with weight loss efforts. She has agreed to the Category 1 Plan.   Make follow-up with PCP regarding flu shot.  Increase protein intake - weigh them.  Gave handout on protein content of foods.  She declines journaling.  Exercise goals:  As is.  Behavioral modification strategies: increasing lean protein intake, no skipping meals/proteins, and better snacking choices.  Molly Bean has agreed to follow-up with our clinic in 3 weeks. She was informed of the importance of frequent follow-up visits to maximize her success with intensive lifestyle modifications for her multiple  health conditions.   Objective:   Blood pressure 104/68, pulse 78, temperature 98.4 F (36.9 C), height _0  (1.575 m), weight 211 lb (95.7 kg), SpO2 100 %. Body mass index is 38.59 kg/m.  General: Cooperative, alert, well developed, in no acute distress. HEENT: Conjunctivae and lids unremarkable. Cardiovascular: Regular rhythm.  Lungs: Normal work of breathing. Neurologic: No focal deficits.   Lab Results  Component Value Date   CREATININE 0.89 11/03/2021   BUN 8 11/03/2021   NA 142 11/03/2021   K 4.4 11/03/2021   CL 102 11/03/2021   CO2 25 11/03/2021   Lab Results  Component Value Date   ALT 10 11/03/2021   AST 15 11/03/2021   ALKPHOS 90 11/03/2021   BILITOT 0.3 11/03/2021   Lab Results  Component Value Date   HGBA1C 5.7 (H) 11/03/2021   HGBA1C 5.9 (H) 07/01/2021   HGBA1C 6.0 (H) 03/23/2021   HGBA1C 6.2 (H) 12/30/2020   HGBA1C 6.1 (H) 08/19/2020   Lab Results  Component Value Date   INSULIN 22.5 11/03/2021   INSULIN 21.1 03/23/2021   INSULIN 21.9 04/15/2020   Lab Results  Component Value Date   TSH 1.100 04/15/2020   Lab Results  Component Value Date   CHOL 170 11/03/2021   HDL 62 11/03/2021   LDLCALC 97 11/03/2021   LDLDIRECT 95 12/30/2020   TRIG 58 11/03/2021   CHOLHDL 2.9 07/01/2021   Lab Results  Component Value Date   VD25OH 30.5 11/03/2021   VD25OH 47.7 05/20/2021   VD25OH 46.9 12/30/2020   Lab Results  Component Value Date   WBC 6.4 11/03/2021   HGB 12.4 11/03/2021   HCT 40.8 11/03/2021   MCV 69 (L) 11/03/2021   PLT 235 11/03/2021   Lab Results  Component Value Date   IRON 63 11/03/2021   TIBC 332 11/03/2021   FERRITIN 72 11/03/2021   Attestation Statements:   Reviewed by clinician on day of visit: allergies, medications, problem list, medical history, surgical history, family history, social history, and previous encounter notes.  I, Water quality scientist, CMA, am acting as Location manager for [employer], DO.  I have reviewed  the above documentation for accuracy and completeness, and I agree with the above. Marjory Sneddon, D.O.  The 21st Century Cures Act was signed into law in 2016  which includes the topic of electronic health records.  This provides immediate access to information in MyChart.  This includes consultation notes, operative notes, office notes, lab results and pathology reports.  If you have any questions about what you read please let us know at your next visit so we can discuss your concerns and take corrective action if need be.  We are right here with you.

## 2021-12-10 ENCOUNTER — Other Ambulatory Visit (INDEPENDENT_AMBULATORY_CARE_PROVIDER_SITE_OTHER): Payer: Self-pay | Admitting: Family Medicine

## 2021-12-10 DIAGNOSIS — E559 Vitamin D deficiency, unspecified: Secondary | ICD-10-CM

## 2021-12-12 ENCOUNTER — Other Ambulatory Visit (INDEPENDENT_AMBULATORY_CARE_PROVIDER_SITE_OTHER): Payer: Self-pay | Admitting: Family Medicine

## 2021-12-12 DIAGNOSIS — E7849 Other hyperlipidemia: Secondary | ICD-10-CM

## 2021-12-14 NOTE — Telephone Encounter (Signed)
Dr.Opalski ?

## 2021-12-29 ENCOUNTER — Ambulatory Visit: Payer: BC Managed Care – PPO | Admitting: Physical Therapy

## 2022-01-04 ENCOUNTER — Encounter (INDEPENDENT_AMBULATORY_CARE_PROVIDER_SITE_OTHER): Payer: Self-pay | Admitting: Family Medicine

## 2022-01-04 ENCOUNTER — Ambulatory Visit (INDEPENDENT_AMBULATORY_CARE_PROVIDER_SITE_OTHER): Payer: BC Managed Care – PPO | Admitting: Family Medicine

## 2022-01-04 VITALS — BP 123/70 | HR 63 | Temp 97.6°F | Ht 62.0 in | Wt 209.0 lb

## 2022-01-04 DIAGNOSIS — R7303 Prediabetes: Secondary | ICD-10-CM | POA: Diagnosis not present

## 2022-01-04 DIAGNOSIS — E7849 Other hyperlipidemia: Secondary | ICD-10-CM

## 2022-01-04 DIAGNOSIS — K5909 Other constipation: Secondary | ICD-10-CM

## 2022-01-04 DIAGNOSIS — E559 Vitamin D deficiency, unspecified: Secondary | ICD-10-CM

## 2022-01-04 DIAGNOSIS — Z6838 Body mass index (BMI) 38.0-38.9, adult: Secondary | ICD-10-CM

## 2022-01-04 DIAGNOSIS — F39 Unspecified mood [affective] disorder: Secondary | ICD-10-CM

## 2022-01-04 DIAGNOSIS — E669 Obesity, unspecified: Secondary | ICD-10-CM

## 2022-01-04 DIAGNOSIS — Z9189 Other specified personal risk factors, not elsewhere classified: Secondary | ICD-10-CM

## 2022-01-04 MED ORDER — LINACLOTIDE 145 MCG PO CAPS
145.0000 ug | ORAL_CAPSULE | Freq: Every day | ORAL | 0 refills | Status: DC
Start: 2022-01-04 — End: 2022-03-04

## 2022-01-04 MED ORDER — SEMAGLUTIDE (1 MG/DOSE) 4 MG/3ML ~~LOC~~ SOPN: 1.0000 mg | PEN_INJECTOR | SUBCUTANEOUS | 0 refills | Status: DC

## 2022-01-04 MED ORDER — METFORMIN HCL 500 MG PO TABS: 500.0000 mg | ORAL_TABLET | Freq: Two times a day (BID) | ORAL | 0 refills | Status: DC

## 2022-01-04 MED ORDER — VITAMIN D (ERGOCALCIFEROL) 1.25 MG (50000 UNIT) PO CAPS: ORAL_CAPSULE | ORAL | 0 refills | Status: DC

## 2022-01-04 MED ORDER — BUPROPION HCL ER (SR) 150 MG PO TB12: 150.0000 mg | ORAL_TABLET | Freq: Every morning | ORAL | 0 refills | Status: DC

## 2022-01-04 MED ORDER — ROSUVASTATIN CALCIUM 10 MG PO TABS: ORAL_TABLET | ORAL | 0 refills | Status: DC

## 2022-01-04 NOTE — Patient Instructions (Signed)
Add strength training with hand weights and shoot for 2-3 days / week, * 2 sets of 10 of :  1) chest presses, 2) bicep curls, 3) tricep extensions, 4) overhead presses and 5) squats to a chair.  ? ? ?

## 2022-01-05 ENCOUNTER — Other Ambulatory Visit (INDEPENDENT_AMBULATORY_CARE_PROVIDER_SITE_OTHER): Payer: Self-pay | Admitting: Family Medicine

## 2022-01-05 DIAGNOSIS — E559 Vitamin D deficiency, unspecified: Secondary | ICD-10-CM

## 2022-01-06 NOTE — Progress Notes (Signed)
? ? ? ?Chief Complaint:  ? ?OBESITY ?Molly Bean is here to discuss her progress with her obesity treatment plan along with follow-up of her obesity related diagnoses. Molly Bean is on the Category 1 Plan and states she is following her eating plan approximately 90% of the time. Molly Bean states she is walking for 60 minutes 6 times per week. ? ?Today's visit was #: 26 ?Starting weight: 240 lbs ?Starting date: 04/15/2020 ?Today's weight: 209 lbs ?Today's date: 01/04/2022 ?Total lbs lost to date: 2 ?Total lbs lost since last in-office visit: 2 ? ?Interim History: Molly Bean did a great job with staying on the plan and also exercising 60 minutes 6 days per week. Molly Bean is here for a follow up office visit.  We reviewed her meal plan and all questions were answered.  Patient's food recall appears to be accurate and consistent with what is on plan when she is following it.   When eating on plan, her hunger and cravings are well controlled. Her husband was newly diagnosed with pre-diabetes. She requests papers on that and metformin again. She is still seeing her PCP every 6 months. ? ?Subjective:  ? ?1. Pre-diabetes ?Molly Bean has a diagnosis of pre-diabetes based on her elevated HgA1c. Her last A1c was 5.7 <3 months ago (down from 6.2. She continues to work on diet and exercise to decrease her risk of diabetes. She denies nausea or hypoglycemia. ? ?2. Other hyperlipidemia ?Molly Bean has hyperlipidemia and has been trying to improve her cholesterol levels with intensive lifestyle modifications including a low saturated fat diet, exercise, and weight loss. She denies any chest pain, claudication, or myalgias. ? ?3. Other constipation ?Molly Bean's symptoms are well controlled on her medications. She denies issues. She is drinking 5 bottles of water per day.  ? ?4. Vitamin D deficiency ?She is currently taking prescription vitamin D 50,000 IU twice weekly. She denies nausea, vomiting or muscle weakness. ? ?5. Mood disorder  (Molly Bean)- emotional eating ?Molly Bean is doing great and she has no concerns. ? ?6. At risk for activity intolerance ?Molly Bean is at risk for exercise intolerance due to starting weight and resistance training. ? ?Assessment/Plan:  ?No orders of the defined types were placed in this encounter. ? ? ?Medications Discontinued During This Encounter  ?Medication Reason  ? buPROPion (WELLBUTRIN SR) 150 MG 12 hr tablet Reorder  ? linaclotide (LINZESS) 145 MCG CAPS capsule Reorder  ? metFORMIN (GLUCOPHAGE) 500 MG tablet Reorder  ? rosuvastatin (CRESTOR) 10 MG tablet Reorder  ? Semaglutide, 1 MG/DOSE, 4 MG/3ML SOPN Reorder  ? Vitamin D, Ergocalciferol, (DRISDOL) 1.25 MG (50000 UNIT) CAPS capsule Reorder  ?  ? ?Meds ordered this encounter  ?Medications  ? buPROPion (WELLBUTRIN SR) 150 MG 12 hr tablet  ?  Sig: Take 1 tablet (150 mg total) by mouth in the morning.  ?  Dispense:  30 tablet  ?  Refill:  0  ?  Ov for rf  ? metFORMIN (GLUCOPHAGE) 500 MG tablet  ?  Sig: Take 1 tablet (500 mg total) by mouth 2 (two) times daily with a meal.  ?  Dispense:  60 tablet  ?  Refill:  0  ?  Ov for rf  ? Semaglutide, 1 MG/DOSE, 4 MG/3ML SOPN  ?  Sig: Inject 1 mg as directed once a week.  ?  Dispense:  3 mL  ?  Refill:  0  ?  One mo supply; ov for rf  ? Vitamin D, Ergocalciferol, (DRISDOL) 1.25 MG (50000 UNIT) CAPS capsule  ?  Sig: 1 po q wed, and 1 po q sun  ?  Dispense:  8 capsule  ?  Refill:  0  ?  Ov for rf  ? rosuvastatin (CRESTOR) 10 MG tablet  ?  Sig: TAKE 1 TABLET BY MOUTH EVERYDAY AT BEDTIME  ?  Dispense:  30 tablet  ?  Refill:  0  ? linaclotide (LINZESS) 145 MCG CAPS capsule  ?  Sig: Take 1 capsule (145 mcg total) by mouth daily before breakfast.  ?  Dispense:  30 capsule  ?  Refill:  0  ?  ? ?1. Pre-diabetes ?We will refill Ozempic and metformin for 1 month. Molly Bean will continue to work on weight loss, exercise, and decreasing simple carbohydrates to help decrease the risk of diabetes.  ? ?- metFORMIN (GLUCOPHAGE) 500 MG tablet; Take  1 tablet (500 mg total) by mouth 2 (two) times daily with a meal.  Dispense: 60 tablet; Refill: 0 ?- Semaglutide, 1 MG/DOSE, 4 MG/3ML SOPN; Inject 1 mg as directed once a week.  Dispense: 3 mL; Refill: 0 ? ?2. Other hyperlipidemia ?Cardiovascular risk and specific lipid/LDL goals reviewed.  We discussed several lifestyle modifications today. We will refill Crestor for 1 month. Molly Bean will decrease sat/trans fats. Orders and follow up as documented in patient record.  ? ?Counseling ?Intensive lifestyle modifications are the first line treatment for this issue. ?Dietary changes: Increase soluble fiber. Decrease simple carbohydrates. ?Exercise changes: Moderate to vigorous-intensity aerobic activity 150 minutes per week if tolerated. ?Lipid-lowering medications: see documented in medical record. ? ?- rosuvastatin (CRESTOR) 10 MG tablet; TAKE 1 TABLET BY MOUTH EVERYDAY AT BEDTIME  Dispense: 30 tablet; Refill: 0 ? ?3. Other constipation ?We will refill Linzess for 1 month. Molly Bean was informed that a decrease in bowel movement frequency is normal while losing weight, but stools should not be hard or painful. Orders and follow up as documented in patient record.  ? ?Counseling ?Getting to Good Bowel Health: Your goal is to have one soft bowel movement each day. Drink at least 8 glasses of water each day. Eat plenty of fiber (goal is over 25 grams each day). It is best to get most of your fiber from dietary sources which includes leafy green vegetables, fresh fruit, and whole grains. You may need to add fiber with the help of OTC fiber supplements. These include Metamucil, Citrucel, and Flaxseed. If you are still having trouble, try adding Miralax or Magnesium Citrate. If all of these changes do not work, Molly Bean. ? ?- linaclotide (LINZESS) 145 MCG CAPS capsule; Take 1 capsule (145 mcg total) by mouth daily before breakfast.  Dispense: 30 capsule; Refill: 0 ? ?4. Vitamin D deficiency ?We will refill  prescription Vitamin D for 1 month. ? ?- I again reiterated the importance of vitamin D (as well as calcium) to their health and wellbeing.  ?- I reviewed possible symptoms of low Vitamin D:  low energy, depressed mood, muscle aches, joint aches, osteoporosis etc. ?- low Vitamin D levels may be linked to an increased risk of cardiovascular events and even increased risk of cancers- such as colon and breast.  ?- ideal vitamin D levels reviewed with patient  ?- I recommend pt take a  weekly prescription vit D - see script below   ?- Informed patient this may be a lifelong thing, and she was encouraged to continue to take the medicine until told otherwise.    ?- weight loss will likely improve availability of vitamin D, thus  encouraged Molly Bean to continue with meal plan and their weight loss efforts to further improve this condition.  Thus, we will need to monitor levels regularly (every 3-4 mo on average) to keep levels within normal limits and prevent over supplementation. ?- pt's questions and concerns regarding this condition addressed. ? ?- Vitamin D, Ergocalciferol, (DRISDOL) 1.25 MG (50000 UNIT) CAPS capsule; 1 po q wed, and 1 po q sun  Dispense: 8 capsule; Refill: 0 ? ?5. 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 Behavior modification techniques were discussed today to help Kathlen deal with her emotional/non-hunger eating behaviors.  Orders and follow up as documented in patient record.  ? ?- buPROPion (WELLBUTRIN SR) 150 MG 12 hr tablet; Take 1 tablet (150 mg total) by mouth in the morning.  Dispense: 30 tablet; Refill: 0 ? ?6. At risk for activity intolerance ?Nezzie was given approximately 15 minutes of exercise intolerance counseling today. She is 61 y.o. female and has risk factors exercise intolerance including obesity. We discussed intensive lifestyle modifications today with an emphasis on specific weight loss instructions and strategies. Geralene will slowly  increase activity as tolerated. ? ?Repetitive spaced learning was employed today to elicit superior memory formation and behavioral change. ? ?7. Obesity with current BMI of 38.3 ?[place] is currently in t

## 2022-01-07 ENCOUNTER — Other Ambulatory Visit (INDEPENDENT_AMBULATORY_CARE_PROVIDER_SITE_OTHER): Payer: Self-pay | Admitting: Family Medicine

## 2022-01-07 DIAGNOSIS — E7849 Other hyperlipidemia: Secondary | ICD-10-CM

## 2022-01-11 ENCOUNTER — Ambulatory Visit: Payer: BC Managed Care – PPO | Attending: Obstetrics and Gynecology | Admitting: Physical Therapy

## 2022-01-11 DIAGNOSIS — N393 Stress incontinence (female) (male): Secondary | ICD-10-CM | POA: Insufficient documentation

## 2022-01-11 DIAGNOSIS — R2689 Other abnormalities of gait and mobility: Secondary | ICD-10-CM | POA: Diagnosis present

## 2022-01-11 DIAGNOSIS — R278 Other lack of coordination: Secondary | ICD-10-CM | POA: Insufficient documentation

## 2022-01-11 NOTE — Patient Instructions (Addendum)
? ? ?On belly: Riding horse ?edge of mattress ? knee bent like riding a horse, move knee towards armpit and out  ?10 reps ? ?_____________ ? ? ?Figure-4 stretch after clam shells and walking  ? ?________________ ? ? ?Moisturizers ?             They are used in the vagina to hydrate the mucous membrane that make up the vaginal canal. ?             Designed to keep a more normal acid balance (ph) ?             Once placed in the vagina, it will last between two to three days. ?             Use 2-3 times per week at bedtime and last longer than 60 min. ?             Ingredients to avoid is glycerin and fragrance, can increase chance of infection ?             Should not be used just before sex due to causing irritation ?             Most are gels administered either in a tampon-shaped applicator or as a vaginal suppository.  ?              They  are non-hormonal. ?  ?  ?Types of Moisturizers ?             Samul Dada- drug store ?             Vitamin E vaginal suppositories- Whole foods, Amazon ?             Moist Again ?             Coconut oil- can break down condoms ?             Julva- (Do no use if on Tamoxifen) amazon ?             Yes moisturizer- amazon ?             NeuEve Silk , NeuEve Silver for menopausal or over 65 (if have severe vaginal atrophy or cancer  ?              treatments use NeuEve Silk for  1 month than move to [employer])- [employer], [place] ?             Olive and Bee intimate cream- [email or web address] ?             Mae vaginal moisturizer- Amazon ?   ?  ?Lubrication ?             Used for intercourse to reduce friction ?             Avoid ones that have glycerin, warming gels, tingling gels, icing or cooling gel, scented ?             Avoid parabens due to a preservative similar to female sex hormone ?             May need to be reapplied once or several times during sexual activity ?             Can be applied to both partners genitals prior to vaginal penetration to minimize friction or  irritation ?             Prevent irritation and mucosal tears  that cause post coital pain and increased the risk of vaginal and ?               urinary tract infections ?             Oil-based lubricants cannot be used with condoms due to breaking them down.  Least likely to irritate ?               vaginal tissue. ?             Plant based-lubes are safe ?             Silicone-based lubrication are thicker and last long and used for post-menopausal women ?  ?Vaginal Lubricators ?Here is a list of some suggested lubricators you can use for intercourse. Use the most hypoallergenic product.  You can place on you or your partner. ?.     Slippery Stuff ?    Sylk or Sliquid Natural H2O ( good  if frequent UTI?s) ?               Blossom Organics (www.blossom-organics.com) ?               Luvena ?               Coconut oil ?               PJur Woman Nude- water based lubricant, amazon ?               Aggie Moats- Amazon ?               Aloe Vera ?               Yes lubricant- [place] ?               Wet Platinum-Silicone, Target, Walgreens ?               Olive and Bee intimate cream-  [email or web address] ? ?Things to avoid in lubricants are glycerin, warming gels, tingling gels, icing or cooling ?gels, and scented gels.  Also avoid Vaseline. ?KY jelly, Replens, and Astroglide kills good bacteria(lactobacilli) ?  ?Things to avoid in the vaginal area ?             Do not use things to irritate the vulvar area ?             No lotions- see below ?             No soaps; can use Aveeno or Calendula cleanser if needed. Must be gentle ?             No deodorants ?             No douches ?             Good to sleep without underwear to let the vaginal area to air out ?             No scrubbing: spread the lips to let warm water rinse over labias and pat dry ?  ?___ ? ?Lubrication ?Used for intercourse to reduce friction ?Avoid ones that have glycerin, nonoxynol-9, petroleum, propylene glycol, chlorhexidine gluconate, warming gels,  tingling gels, icing or cooling gel, scented ?Avoid parabens due to a preservative similar to female sex hormone ?May need to be reapplied once or several times during sexual activity ?Can be applied to both partners genitals prior to vaginal penetration to minimize friction or irritation ?Prevent irritation  and mucosal tears that cause post coital pain and increased the risk of vaginal and urinary tract infections ?Oil-based lubricants cannot be used with condoms due to breaking them down.  Least likely to irritate vaginal tissue.  ?Plant based-lubes are safe ?Silicone-based lubrication are thicker and last long and used for post-menopausal women ? ?Vaginal Lubricators ?Here is a list of some suggested lubricators you can use for intercourse. Use the most hypoallergenic product.  You can place on you or your partner.  ?Slippery Stuff ( water based) ?Sylk or Sliquid Natural H2O ( good  if frequent UTI?s)- walmart, amazon ?Sliquid organics silk-(aloe and silicone based ) ?Blossom Organics (www.blossom-[email or web address])- (aloe based ) ?Coconut oil, olive oil -not good with condoms  ?PJur Woman Nude- (water based) amazon ?Uberlube- ( silicon) Amazon ?Aloe Vera- Sprouts has an organic one ?Yes lubricant- (water based and has plant oil based similar to silicone) Amazon ?Wet Platinum-Silicone, Target, Walgreens ?Olive and Bee intimate cream-  www.oliveandbee.[email or web address] ?[place] ?Wet stuff ?Erosense Sync- walmart, amazon ?Coconu- FailLinks.co.uk ? ?Things to avoid in lubricants are glycerin, warming gels, tingling gels, icing or cooling ? gels, and scented gels.  Also avoid Vaseline. ?KY jelly, Replens, and Astroglide contain chlorhexidine which kills good bacteria(lactobacilli) ? ?Things to avoid in the vaginal area ?Do not use things to irritate the vulvar area ?No lotions- see below ?Soaps you  can use :Aveeno, Calendula, Good Clean Love cleanser if needed. Must be gentle ?No deodorants ?No douches ?Good to sleep without  underwear to let the vaginal area to air out ?No scrubbing: spread the lips to let warm water rinse over labias and pat dry ? ?Creams that can be used on the Vulva Area ?V magic-amazon, walmart ?[place] ?Julva- Amazon ?MoonMaid Botanical Pro-Meno Wild Yam Cream ?Coconut oil, olive oil ?Cleo by Damiva labial moisturizer -[place],  ?Desert Havest Releveum ( lidocaine) or [place] [employer] ?Yes Moisturizer ? ?  ? ?

## 2022-01-11 NOTE — Therapy (Addendum)
[facility] ?[place], [place], [address] ?Phone: [phone]   Fax:  [phone] ? ?Physical Therapy Treatment ? ?Patient Details  ?Name: Molly Bean ?MRN: [id number] ?Date of Birth: 09-08-1961 ?Referring Provider (PT): Wannetta Sender MD ? ? ?Encounter Date: 01/11/2022 ? ? PT End of Session - 01/11/22 0910   ? ? Visit Number 21   ? Date for PT Re-Evaluation 31/51/76   PN & recert 1/60/73  ? PT Start Time [phone]   ? PT Stop Time 1002   ? PT Time Calculation (min) 53 min   ? Activity Tolerance Patient tolerated treatment well   ? Behavior During Therapy [facility] for tasks assessed/performed   ? ?  ?  ? ?  ? ? ?Past Medical History:  ?Diagnosis Date  ? Anxiety   ? Constipation   ? DEPRESSION   ? edema lower extremeties   ? GERD   ? Joint pain   ? MENOPAUSAL SYNDROME   ? Multiple food allergies   ? OBESITY   ? Shortness of breath   ? Sleep apnea   ? wears CPAP  ? ? ?Past Surgical History:  ?Procedure Laterality Date  ? ABDOMINAL HYSTERECTOMY  11/1998  ? total fibroids  ? CESAREAN SECTION    ? ? ?There were no vitals filed for this visit. ? ? Subjective Assessment - 01/11/22 0911   ? ? Subjective Pt reports she lost 40 lbs across 1-2 years. Pt noticed her leakage is 60% better  and her pads are staying drier. Pt will see Dr. Windy Canny this Friday. Pt still experiences pain with intercourse which started after her hysterectemy.   ? Pertinent History C-section, abdominal hysterectomy   ? Patient Stated Goals decrease leakage, have sex and get through pelvic exams without pain   ? ?  ?  ? ?  ? ? ? ? ? [facility] PT Assessment - 01/11/22 1548   ? ?  ? Palpation  ? SI assessment  hypomobile and tenderness at L SIJ , glut med   ? ?  ?  ? ?  ? ? ? ? ? ? ? ? ? ? ? ? ? ? ? ? [facility] PT Treatment/Exercise - 01/11/22 0957   ? ?  ? Therapeutic Activites   ? Other Therapeutic Activities explained the difference between vaginal moisturizer and lubricants, discussed asking her MD  about estrogen cream 2/2 hysterectomy,   ?  ? Neuro Re-ed   ? Neuro Re-ed Details  cued for figure-4 stretches and SIJ stabilization   ?  ? Modalities  ? Modalities Moist Heat   ?  ? Moist Heat Therapy  ? Number Minutes Moist Heat 5 Minutes   ? Moist Heat Location --   SIJ  ?  ? Manual Therapy  ? Manual therapy comments long axis distraction LLE, STM/MWM at SIJ and glut med   ? ?  ?  ? ?  ? ? ? ? ? ? ? ? ? ? ? ? ? ? ? PT Long Term Goals - 12/02/21 0859   ? ?  ? PT LONG TERM GOAL #1  ? Title Pt will demo increased hip abduction strength from 3/5 to >4/5 in order to maintain walking routine and pelvic stability   ? Time 8   ? Period Weeks   ? Status Achieved   ? Target Date 02/03/22   ?  ? PT LONG TERM GOAL #2  ? Title Pt will demo  proper deepcore coordination and pelvic floor movement with no cues to progress to kegel exercises   ? Time 4   ? Period Weeks   ? Status Achieved   ? Target Date 02/03/22   ?  ? PT LONG TERM GOAL #3  ? Title Pt will demo decreased abdominal scar restrictions to advance to deep core exercises and promote IAP for continence and bowel movement elimination with less dependence on Linzess   ? Time 4   ? Period Weeks   ? Status Achieved   ? Target Date 02/03/22   ?  ? PT LONG TERM GOAL #4  ? Title Pt will increase FOTO score for constipation from 41 pts and Urinary 47 pts to > 55 pts in order to demo improved pelvic floor function  ( 06/15/21: no change urinary, 2 pt change improvement  with bowel function,  11/25/21: neg scores of change, need to readminster because pt has reported improvements  )   ? Time 10   ? Period Weeks   ? Status On-going   ?  ? PT LONG TERM GOAL #5  ? Title Pt will demo decreased pelvic floor mm tightness and proper relaxation of mm to have less pain with intercourcse and gynecological exams   ? Time 10   ? Period Weeks   ? Status Achieved   ? Target Date 02/03/22   ?  ? PT LONG TERM GOAL #6  ? Title Pt will demo no pelvic floor tightness/ tendernessacorss 2 sessions,  demo proper quick contractions > 5 reps and long holds 3sec, 5 reps without cues ino rder to gain continence   ? Time 10   ? Period Weeks   ? Status Partially Met   ? Target Date 02/03/22   ? ?  ?  ? ?  ? ? ? ? ? ? ? ? Plan - 01/11/22 0911   ? ? Clinical Impression Statement Pt noticed her leakage is 60% better  and her pads are staying drier. Pt will see Dr. Windy Canny this Friday. Pt still experiences pain with intercourse which started after her hysterectemy. Provided education about use of lubricants verses vaginal moisturizer. Advised pt to speak to provider about whether she is appropriate for estrogen cream given state of menopause induced by her hysterectomy. ? ?Pt demo'd increased hypomobility and tenderness at L SIJ and required manual Tx. Tx was modified with decreased pressure or modified technique when pt reported she could not tolerate previous technique. Pt demo'd improved SIJ mobility and less tenderness post Tx. Still withholding kegel strengthening until pt demo no more hypomobility at SIJ and pelvic floor. Pt continues to benefits from skilled PT ?  ? Examination-Activity Limitations Toileting;Continence;Other   ? Stability/Clinical Decision Making Evolving/Moderate complexity   ? Rehab Potential Good   ? PT Frequency 1x / week   ? PT Duration Other (comment)   10  ? PT Treatment/Interventions Neuromuscular re-education;Cryotherapy;ADLs/Self Care Home Management;Therapeutic activities;Functional mobility training;Traction;Moist Heat;Stair training;Gait training;Patient/family education;Taping;Manual techniques;Scar mobilization;Balance training;Therapeutic exercise;Spinal Manipulations;Joint Manipulations;Energy conservation   ? Consulted and Agree with Plan of Care Patient   ? ?  ?  ? ?  ? ? ?Patient will benefit from skilled therapeutic intervention in order to improve the following deficits and impairments:  Decreased endurance, Decreased safety awareness, Difficulty walking, Decreased  coordination, Abnormal gait, Improper body mechanics, Pain, Increased muscle spasms, Hypermobility, Hypomobility, Decreased scar mobility, Decreased mobility, Decreased strength, Postural dysfunction ? ?Visit Diagnosis: ?Other lack of coordination ? ?Stress incontinence  of urine ? ?Other abnormalities of gait and mobility ? ? ? ? ?Problem List ?Patient Active Problem List  ? Diagnosis Date Noted  ? Axillary hidradenitis suppurativa 07/07/2021  ? Proteus mirabilis infection 01/16/2021  ? Cutaneous abscess of right axilla 01/13/2021  ? Iron deficiency anemia 12/10/2020  ? At risk for side effect of medication 12/10/2020  ? Other constipation 08/19/2020  ? Prediabetes 08/06/2020  ? Chronic constipation 08/06/2020  ? Other hyperlipidemia 08/06/2020  ? Vitamin D deficiency 08/06/2020  ? Vaginitis 04/09/2020  ? Essential hypertension 05/01/2018  ? Asthma 03/14/2018  ? Hidradenitis suppurativa 01/18/2018  ? GAD (generalized anxiety disorder) 10/11/2016  ? MENOPAUSAL SYNDROME 04/23/2009  ? OBESITY 02/15/2008  ? Mood disorder ([place]) 04/12/2007  ? Gastroesophageal reflux disease 04/12/2007  ? ? ?Jerl Mina, PT ?01/11/2022, 3:54 PM ? ?[facility] ?[place], [place], [address] ?Phone: [phone]   Fax:  [phone] ? ?Name: Molly Bean ?MRN: [id number] ?Date of Birth: 20-Jul-1961 ? ? ? ?

## 2022-01-12 ENCOUNTER — Other Ambulatory Visit (INDEPENDENT_AMBULATORY_CARE_PROVIDER_SITE_OTHER): Payer: Self-pay | Admitting: Family Medicine

## 2022-01-12 DIAGNOSIS — J45909 Unspecified asthma, uncomplicated: Secondary | ICD-10-CM

## 2022-01-13 ENCOUNTER — Ambulatory Visit (INDEPENDENT_AMBULATORY_CARE_PROVIDER_SITE_OTHER): Payer: BC Managed Care – PPO | Admitting: Obstetrics and Gynecology

## 2022-01-13 ENCOUNTER — Encounter: Payer: Self-pay | Admitting: Obstetrics and Gynecology

## 2022-01-13 VITALS — BP 119/83 | HR 76

## 2022-01-13 DIAGNOSIS — N3946 Mixed incontinence: Secondary | ICD-10-CM | POA: Diagnosis not present

## 2022-01-13 DIAGNOSIS — N952 Postmenopausal atrophic vaginitis: Secondary | ICD-10-CM

## 2022-01-13 DIAGNOSIS — N941 Unspecified dyspareunia: Secondary | ICD-10-CM

## 2022-01-13 MED ORDER — LIDOCAINE-PRILOCAINE 2.5-2.5 % EX CREA: 1.0000 "application " | TOPICAL_CREAM | CUTANEOUS | 5 refills | Status: AC | PRN

## 2022-01-13 MED ORDER — ESTRADIOL 0.1 MG/GM VA CREA: TOPICAL_CREAM | VAGINAL | 11 refills | Status: AC

## 2022-01-13 NOTE — Progress Notes (Signed)
[facility] ?Return Visit ? ?SUBJECTIVE  ?History of Present Illness: ?Molly Bean is a 61 y.o. female seen in follow-up for mixed incontinence and levator spasm.  ?  ?Leakage happens out of the blue, not always aware of it happening. Previously tried myrbetriq, trospium and vesicare without benefit. Has seen a lot of benefit with physical therapy.  ? ?Still having pain with intercourse. She is using KY jelly. Has used estrogen cream in the past. On insertion has a lot of pain but also has pain during. Wanted to ask about alternatives for dryness.  ? ?Past Medical History: ?Patient  has a past medical history of Anxiety, Constipation, DEPRESSION, edema lower extremeties, GERD, Joint pain, MENOPAUSAL SYNDROME, Multiple food allergies, OBESITY, Shortness of breath, and Sleep apnea.  ? ?Past Surgical History: ?She  has a past surgical history that includes Cesarean section and Abdominal hysterectomy (11/1998).  ? ?Medications: ?She has a current medication list which includes the following prescription(s): acetaminophen, albuterol, bupropion, estradiol, iron, lidocaine-prilocaine, linaclotide, metformin, mirabegron er, rosuvastatin, semaglutide (1 mg/dose), triamterene-hydrochlorothiazide, and vitamin d (ergocalciferol).  ? ?Allergies: ?Patient is allergic to shellfish allergy, pennsaid [diclofenac sodium], and oxycodone.  ? ?Social History: ?Patient  reports that she quit smoking about 44 years ago. Her smoking use included cigarettes. She has a 6.00 pack-year smoking history. She quit smokeless tobacco use about 34 years ago. She reports that she does not drink alcohol and does not use drugs.  ?  ?  ?OBJECTIVE  ?  ? ?Physical Exam: ?Vitals:  ? 01/13/22 0821  ?BP: 119/83  ?Pulse: 76  ? ? ?Gen: No apparent distress, A&O x 3. ? ?Detailed Urogynecologic Evaluation:  ?Deferred.   ? ?ASSESSMENT AND PLAN  ?  ?Molly Bean is a 61 y.o. with:  ?1. Vaginal atrophy   ?2. Dyspareunia, female   ?3. Mixed  incontinence   ? ? ?Vaginal atrophy ?- prescribed estrace cream 0.5g nightly for two weeks then twice a week after.  ?- Also recommended use of coconut oil, vitamin E or silicone based lubricant during intercourse.  ? ?2. Dyspareunia ?- Continue with PT for pelvic floor muscle tension ?- prescribed emla cream to use at the introitus prior to intercourse. Wipe off any excess.  ?- Also discussed the use of vaginal dilators to progressively become more comfortable with insertion.  ? ?3. Mixed incontinence ?- Did not respond to OAB medications ?- If she wants to pursue treatment, would recommend urodynamic testing to better evaluate type of leakage.  ?- She would like to hold off at this time and continue with physical therapy through July ? ?Follow up 4 months or sooner if needed ? ?Jaquita Folds, MD ? ? ? ?

## 2022-01-13 NOTE — Patient Instructions (Addendum)
Vulvovaginal moisturizer Options: ?Vitamin E oil (pump or capsule) or cream (Gene's Vit E Cream) ?Coconut oil ?Silicone-based lubricant for use during intercourse ("uberlube" or "wet platinum" is a brand available at most drugstores) ?[place]risco ?Consider the ingredients of the product - the fewer the ingredients the better! ? ?Directions for Use: ?Clean and dry your hands ?Gently dab the vulvar/vaginal area dry as needed ?Apply a ?pea-sized? amount of the moisturizer onto your fingertip ?Using you other hand, open the labia  ?Apply the moisturizer to the vulvar/vaginal tissues ?Wear loose fitting underwear/clothing if possible following application ?Use moisturize up to 3 times daily as desired. ? ? ?Apply the lidocaine/ prilocaine cream to the vaginal opening prior to intercourse. Wipe off any excess after a few minutes.  ? ?Consider getting vaginal dilators and gradually insert to get used to the insertion.  ? ?

## 2022-01-18 ENCOUNTER — Ambulatory Visit: Payer: BC Managed Care – PPO | Admitting: Physical Therapy

## 2022-01-18 DIAGNOSIS — R2689 Other abnormalities of gait and mobility: Secondary | ICD-10-CM

## 2022-01-18 DIAGNOSIS — R278 Other lack of coordination: Secondary | ICD-10-CM | POA: Diagnosis not present

## 2022-01-18 DIAGNOSIS — N393 Stress incontinence (female) (male): Secondary | ICD-10-CM

## 2022-01-18 NOTE — Therapy (Addendum)
[place] ?[facility] ?[place], [place], [address] ?Phone: [phone]   Fax:  [phone] ? ?Physical Therapy Treatment ? ?Patient Details  ?Name: Molly Bean ?MRN: [id number] ?Date of Birth: 1960-11-28 ?No data recorded ? ?Encounter Date: 01/18/2022 ? ? PT End of Session - 01/18/22 0941   ? ? Visit Number 22   ? Date for PT Re-Evaluation 44/03/47   PN & recert 01/26/94  ? PT Start Time 0900   ? PT Stop Time 1000   ? PT Time Calculation (min) 60 min   ? Activity Tolerance Patient tolerated treatment well   ? Behavior During Therapy [facility] for tasks assessed/performed   ? ?  ?  ? ?  ? ? ?Past Medical History:  ?Diagnosis Date  ? Anxiety   ? Constipation   ? DEPRESSION   ? edema lower extremeties   ? GERD   ? Joint pain   ? MENOPAUSAL SYNDROME   ? Multiple food allergies   ? OBESITY   ? Shortness of breath   ? Sleep apnea   ? wears CPAP  ? ? ?Past Surgical History:  ?Procedure Laterality Date  ? ABDOMINAL HYSTERECTOMY  11/1998  ? total fibroids  ? CESAREAN SECTION    ? ? ?There were no vitals filed for this visit. ? ? Subjective Assessment - 01/18/22 0907   ? ? Subjective Pt reports she saw her urogynecologist who prescribed her estrogen cream. Pt was also explained about dilutor use.   ? Pertinent History C-section, abdominal hysterectomy   ? Patient Stated Goals decrease leakage, have sex and get through pelvic exams without pain   ? ?  ?  ? ?  ? ? ? ? ? ? ? ? ? ? ? ? ? ? ? ? ? Pelvic Floor Special Questions - 01/18/22 0938   ? ? Pelvic Floor Internal Exam pt consented verbally and had no contraindications   ? Exam Type Vaginal   ? Palpation /tightness at  posterior mm by coccyx, less tenderness over all. slight tightness behind pubic symphysis, no lowered positoinof bladder and urethra, ATLA B   ? Strength fair squeeze, definite lift   required cued for more anterior tilt for optimal lengthening  ? ?  ?  ? ?  ? ? ? ? [facility] Adult PT Treatment/Exercise -  01/18/22 0939   ? ?  ? Therapeutic Activites   ? Other Therapeutic Activities explained dilators set to buy and plan to guide pt to use them at upcoming sessions   ?  ? Neuro Re-ed   ? Neuro Re-ed Details  cued for anterior tilt of pelvis   ?  ? Modalities  ? Modalities Moist Heat   ?  ? Moist Heat Therapy  ? Number Minutes Moist Heat 5 Minutes   ? Moist Heat Location --   perineum, in supported butterfly pose for optimal lengthening  ?  ? Manual Therapy  ? Internal Pelvic Floor STM/MWM at problem areas noted in assessment for optimal lengthening   ? ?  ?  ? ?  ? ? ? ? ? ? ? ? ? ? ? ? ? ? ? PT Long Term Goals - 01/18/22 0957   ? ?  ? PT LONG TERM GOAL #1  ? Title Pt will demo increased hip abduction strength from 3/5 to >4/5 in order to maintain walking routine and pelvic stability   ? Time 8   ? Period Weeks   ?  Status Achieved   ? Target Date 02/03/22   ?  ? PT LONG TERM GOAL #2  ? Title Pt will demo proper deepcore coordination and pelvic floor movement with no cues to progress to kegel exercises   ? Time 4   ? Period Weeks   ? Status Achieved   ? Target Date 02/03/22   ?  ? PT LONG TERM GOAL #3  ? Title Pt will demo decreased abdominal scar restrictions to advance to deep core exercises and promote IAP for continence and bowel movement elimination with less dependence on Linzess   ? Time 4   ? Period Weeks   ? Status Achieved   ? Target Date 02/03/22   ?  ? PT LONG TERM GOAL #4  ? Title Pt will increase FOTO score for constipation from 41 pts and Urinary 47 pts to > 55 pts in order to demo improved pelvic floor function  ( 06/15/21: no change urinary, 2 pt change improvement  with bowel function,  11/25/21: neg scores of change, need to readminster because pt has reported improvements  )   ? Time 10   ? Period Weeks   ? Status On-going   ?  ? PT LONG TERM GOAL #5  ? Title Pt will demo decreased pelvic floor mm tightness and proper relaxation of mm to have less pain with intercourcse and gynecological exams   ?  Time 10   ? Period Weeks   ? Status Achieved   ? Target Date 02/03/22   ?  ? PT LONG TERM GOAL #6  ? Title Pt will demo no pelvic floor tightness/ tendernessacorss 2 sessions, demo proper quick contractions > 5 reps and long holds 3sec, 5 reps without cues ino rder to gain continence   ? Time 10   ? Period Weeks   ? Status Partially Met   ? Target Date 02/03/22   ? ?  ?  ? ?  ? ? ? ? ? ? ? ? Plan - 01/18/22 0941   ? ? Clinical Impression Statement Pt required cued for optimal posterior mm lengthening and more anterior tilt of pelvis. Pt progressed to minisquat with excessive cues for technique, alignment , and less tightening of pelvic floor with more attention to ballmounds of feet, toes abducted.  Explained about using dilators for releasing tight pelvic floor mm and plan to guide pt to use them at upcoming sessions.  ? ?Pt continues to benefit from skilled PT  ?  ? Examination-Activity Limitations Toileting;Continence;Other   ? Stability/Clinical Decision Making Evolving/Moderate complexity   ? Rehab Potential Good   ? PT Frequency 1x / week   ? PT Duration Other (comment)   10  ? PT Treatment/Interventions Neuromuscular re-education;Cryotherapy;ADLs/Self Care Home Management;Therapeutic activities;Functional mobility training;Traction;Moist Heat;Stair training;Gait training;Patient/family education;Taping;Manual techniques;Scar mobilization;Balance training;Therapeutic exercise;Spinal Manipulations;Joint Manipulations;Energy conservation   ? Consulted and Agree with Plan of Care Patient   ? ?  ?  ? ?  ? ? ?Patient will benefit from skilled therapeutic intervention in order to improve the following deficits and impairments:  Decreased endurance, Decreased safety awareness, Difficulty walking, Decreased coordination, Abnormal gait, Improper body mechanics, Pain, Increased muscle spasms, Hypermobility, Hypomobility, Decreased scar mobility, Decreased mobility, Decreased strength, Postural dysfunction ? ?Visit  Diagnosis: ?Stress incontinence of urine ? ?Other abnormalities of gait and mobility ? ?Other lack of coordination ? ? ? ? ?Problem List ?Patient Active Problem List  ? Diagnosis Date Noted  ? Axillary hidradenitis suppurativa 07/07/2021  ?  Proteus mirabilis infection 01/16/2021  ? Cutaneous abscess of right axilla 01/13/2021  ? Iron deficiency anemia 12/10/2020  ? At risk for side effect of medication 12/10/2020  ? Other constipation 08/19/2020  ? Prediabetes 08/06/2020  ? Chronic constipation 08/06/2020  ? Other hyperlipidemia 08/06/2020  ? Vitamin D deficiency 08/06/2020  ? Vaginitis 04/09/2020  ? Essential hypertension 05/01/2018  ? Asthma 03/14/2018  ? Hidradenitis suppurativa 01/18/2018  ? GAD (generalized anxiety disorder) 10/11/2016  ? MENOPAUSAL SYNDROME 04/23/2009  ? OBESITY 02/15/2008  ? Mood disorder ([place]) 04/12/2007  ? Gastroesophageal reflux disease 04/12/2007  ? ? ?Jerl Mina, PT ?01/18/2022, 10:03 AM ? ?[place] ?[facility] ?[place], [place], [address] ?Phone: [phone]   Fax:  [phone] ? ?Name: Kamilya Wakeman ?MRN: [id number] ?Date of Birth: 09-Jul-1961 ? ? ? ?

## 2022-01-18 NOTE — Patient Instructions (Signed)
Minisquat: ?Scoot buttocks back slight, hinge like you are looking at your reflection on a pond  ?Knees behind toes,  ?Inhale to "smell flowers" ? ?Exhale on the rise "like rocket"  ?Do not lock knees, have more weight across ballmounds of feet, toes relaxed and raise up and spread  ? ?THEN HALF step on both feet first lap with left foot leading down a hall way = 1 lap  ?Repeated with other foot leading =1 lap ? ?2 laps each side  ? ? ?__ ? ?Stretches  ? ?___ ? ?Notice relaxation of back pelvic floor muscles, relax the anus, have more anterior tilt of pelvis to relax  ?

## 2022-01-19 ENCOUNTER — Other Ambulatory Visit (INDEPENDENT_AMBULATORY_CARE_PROVIDER_SITE_OTHER): Payer: Self-pay | Admitting: Family Medicine

## 2022-01-19 DIAGNOSIS — F39 Unspecified mood [affective] disorder: Secondary | ICD-10-CM

## 2022-01-26 ENCOUNTER — Encounter: Admitting: Physical Therapy

## 2022-01-28 ENCOUNTER — Encounter (INDEPENDENT_AMBULATORY_CARE_PROVIDER_SITE_OTHER): Payer: Self-pay | Admitting: Family Medicine

## 2022-01-28 ENCOUNTER — Ambulatory Visit (INDEPENDENT_AMBULATORY_CARE_PROVIDER_SITE_OTHER): Payer: BC Managed Care – PPO | Admitting: Family Medicine

## 2022-01-28 VITALS — BP 114/69 | HR 76 | Temp 98.0°F | Ht 62.0 in | Wt 210.0 lb

## 2022-01-28 DIAGNOSIS — E669 Obesity, unspecified: Secondary | ICD-10-CM

## 2022-01-28 DIAGNOSIS — R7303 Prediabetes: Secondary | ICD-10-CM | POA: Diagnosis not present

## 2022-01-28 DIAGNOSIS — Z6838 Body mass index (BMI) 38.0-38.9, adult: Secondary | ICD-10-CM

## 2022-01-28 DIAGNOSIS — Z6841 Body Mass Index (BMI) 40.0 and over, adult: Secondary | ICD-10-CM

## 2022-01-28 DIAGNOSIS — E559 Vitamin D deficiency, unspecified: Secondary | ICD-10-CM | POA: Diagnosis not present

## 2022-01-28 DIAGNOSIS — Z9189 Other specified personal risk factors, not elsewhere classified: Secondary | ICD-10-CM

## 2022-01-28 MED ORDER — VITAMIN D (ERGOCALCIFEROL) 1.25 MG (50000 UNIT) PO CAPS: ORAL_CAPSULE | ORAL | 0 refills | Status: DC

## 2022-02-03 ENCOUNTER — Ambulatory Visit: Payer: BC Managed Care – PPO | Attending: Obstetrics and Gynecology | Admitting: Physical Therapy

## 2022-02-03 DIAGNOSIS — R278 Other lack of coordination: Secondary | ICD-10-CM | POA: Insufficient documentation

## 2022-02-03 DIAGNOSIS — N393 Stress incontinence (female) (male): Secondary | ICD-10-CM | POA: Diagnosis present

## 2022-02-03 DIAGNOSIS — R2689 Other abnormalities of gait and mobility: Secondary | ICD-10-CM | POA: Insufficient documentation

## 2022-02-03 NOTE — Therapy (Signed)
[place] ?[facility] ?[place], [place], [address] ?Phone: [phone]   Fax:  [phone] ? ?Physical Therapy Treatment ? ?Patient Details  ?Name: Molly Bean ?MRN: [id number] ?Date of Birth: 1961-07-18 ?No data recorded ? ?Encounter Date: 02/03/2022 ? ? PT End of Session - 02/03/22 1246   ? ? Visit Number 23   ? Date for PT Re-Evaluation 67/34/19   PN & recert 3/79/02, 4/0/97  ? PT Start Time [phone]   ? PT Stop Time 1000   ? PT Time Calculation (min) 53 min   ? Activity Tolerance Patient tolerated treatment well   ? Behavior During Therapy [facility] for tasks assessed/performed   ? ?  ?  ? ?  ? ? ?Past Medical History:  ?Diagnosis Date  ? Anxiety   ? Constipation   ? DEPRESSION   ? edema lower extremeties   ? GERD   ? Joint pain   ? MENOPAUSAL SYNDROME   ? Multiple food allergies   ? OBESITY   ? Shortness of breath   ? Sleep apnea   ? wears CPAP  ? ? ?Past Surgical History:  ?Procedure Laterality Date  ? ABDOMINAL HYSTERECTOMY  11/1998  ? total fibroids  ? CESAREAN SECTION    ? ? ?There were no vitals filed for this visit. ? ? Subjective Assessment - 02/03/22 1011   ? ? Subjective Pt is still walking for exercise. Pt is noticing her pad is not as wet.   ? Pertinent History C-section, abdominal hysterectomy   ? Patient Stated Goals decrease leakage, have sex and get through pelvic exams without pain   ? ?  ?  ? ?  ? ? ? ? ? ? ? ? ? ? ? ? ? ? ? ? ? Pelvic Floor Special Questions - 02/03/22 9924   ? ? Pelvic Floor Internal Exam pt consented verbally and had no contraindications   ? Exam Type Vaginal   ? Palpation tightness at deepest layer of  posterior mm / ATLA, obt int B   ? Strength fair squeeze, definite lift   proper lengthening  ? ?  ?  ? ?  ? ? ? ? [facility] Adult PT Treatment/Exercise - 02/03/22 1245   ? ?  ? Neuro Re-ed   ? Neuro Re-ed Details  cued for mindful practice   ?  ? Modalities  ? Modalities Moist Heat   ?  ? Moist Heat Therapy  ? Number Minutes  Moist Heat 10 Minutes   ? Moist Heat Location --   perineum ( through sheets), pillow under kness, guided pt through mindfulness practice for deeper relaxation  ?  ? Manual Therapy  ? Internal Pelvic Floor STM/MWM at problem areas noted in assessment for optimal lengthening   ? ?  ?  ? ?  ? ? ? ? ? ? ? ? ? ? ? ? ? ? ? PT Long Term Goals - 01/18/22 0957   ? ?  ? PT LONG TERM GOAL #1  ? Title Pt will demo increased hip abduction strength from 3/5 to >4/5 in order to maintain walking routine and pelvic stability   ? Time 8   ? Period Weeks   ? Status Achieved   ? Target Date 02/03/22   ?  ? PT LONG TERM GOAL #2  ? Title Pt will demo proper deepcore coordination and pelvic floor movement with no cues to progress to kegel exercises   ?  Time 4   ? Period Weeks   ? Status Achieved   ? Target Date 02/03/22   ?  ? PT LONG TERM GOAL #3  ? Title Pt will demo decreased abdominal scar restrictions to advance to deep core exercises and promote IAP for continence and bowel movement elimination with less dependence on Linzess   ? Time 4   ? Period Weeks   ? Status Achieved   ? Target Date 02/03/22   ?  ? PT LONG TERM GOAL #4  ? Title Pt will increase FOTO score for constipation from 41 pts and Urinary 47 pts to > 55 pts in order to demo improved pelvic floor function  ( 06/15/21: no change urinary, 2 pt change improvement  with bowel function,  11/25/21: neg scores of change, need to readminster because pt has reported improvements  )   ? Time 10   ? Period Weeks   ? Status On-going   ?  ? PT LONG TERM GOAL #5  ? Title Pt will demo decreased pelvic floor mm tightness and proper relaxation of mm to have less pain with intercourcse and gynecological exams   ? Time 10   ? Period Weeks   ? Status Achieved   ? Target Date 02/03/22   ?  ? PT LONG TERM GOAL #6  ? Title Pt will demo no pelvic floor tightness/ tendernessacorss 2 sessions, demo proper quick contractions > 5 reps and long holds 3sec, 5 reps without cues ino rder to gain  continence   ? Time 10   ? Period Weeks   ? Status Partially Met   ? Target Date 02/03/22   ? ?  ?  ? ?  ? ? ? ? ? ? ? ? Plan - 02/03/22 1247   ? ? Clinical Impression Statement Pt achieved 50% of her goals. Pt has made milestone improvements. Pt is noticing her pad is not as wet.  Pt has significantly decreased pelvic floor tightness and tenderness and proper lengthening of pelvic floor. Prolapse is in a more upward position with proper activation of pelvic floor and deep core system. Pt is not longer straining nor pushing downward onto pelvic floor which helps with prolapse. Pt also has increased pelvic girdle stability with increased hip abduction strength and lower kinetic chain mobility and SIJ mobility. Pt's C section scar restrictions have decreased with manual Tx which has helped with lower and lengthening pelvic floor. Pt continues to benefit from skilled PT.   ? Examination-Activity Limitations Toileting;Continence;Other   ? Stability/Clinical Decision Making Evolving/Moderate complexity   ? Rehab Potential Good   ? PT Frequency 1x / week   ? PT Duration Other (comment)   10  ? PT Treatment/Interventions Neuromuscular re-education;Cryotherapy;ADLs/Self Care Home Management;Therapeutic activities;Functional mobility training;Traction;Moist Heat;Stair training;Gait training;Patient/family education;Taping;Manual techniques;Scar mobilization;Balance training;Therapeutic exercise;Spinal Manipulations;Joint Manipulations;Energy conservation   ? Consulted and Agree with Plan of Care Patient   ? ?  ?  ? ?  ? ? ?Patient will benefit from skilled therapeutic intervention in order to improve the following deficits and impairments:  Decreased endurance, Decreased safety awareness, Difficulty walking, Decreased coordination, Abnormal gait, Improper body mechanics, Pain, Increased muscle spasms, Hypermobility, Hypomobility, Decreased scar mobility, Decreased mobility, Decreased strength, Postural dysfunction ? ?Visit  Diagnosis: ?Stress incontinence of urine ? ?Other abnormalities of gait and mobility ? ?Other lack of coordination ? ? ? ? ?Problem List ?Patient Active Problem List  ? Diagnosis Date Noted  ? Axillary hidradenitis suppurativa 07/07/2021  ?  Proteus mirabilis infection 01/16/2021  ? Cutaneous abscess of right axilla 01/13/2021  ? Iron deficiency anemia 12/10/2020  ? At risk for side effect of medication 12/10/2020  ? Other constipation 08/19/2020  ? Prediabetes 08/06/2020  ? Chronic constipation 08/06/2020  ? Other hyperlipidemia 08/06/2020  ? Vitamin D deficiency 08/06/2020  ? Vaginitis 04/09/2020  ? Essential hypertension 05/01/2018  ? Asthma 03/14/2018  ? Hidradenitis suppurativa 01/18/2018  ? GAD (generalized anxiety disorder) 10/11/2016  ? MENOPAUSAL SYNDROME 04/23/2009  ? OBESITY 02/15/2008  ? Mood disorder ([place]) 04/12/2007  ? Gastroesophageal reflux disease 04/12/2007  ? ? ?Jerl Mina, PT ?02/03/2022, 1:47 PM ? ?[facility] ?[facility] ?[place], [place], [address] ?Phone: [phone]   Fax:  [phone] ? ?Name: Armida Vickroy ?MRN: [id number] ?Date of Birth: 1961/05/27 ? ? ? ?

## 2022-02-03 NOTE — Patient Instructions (Signed)
Body scan mindful practice emailed  ?

## 2022-02-09 ENCOUNTER — Ambulatory Visit: Payer: BC Managed Care – PPO | Admitting: Physical Therapy

## 2022-02-09 DIAGNOSIS — N393 Stress incontinence (female) (male): Secondary | ICD-10-CM

## 2022-02-09 DIAGNOSIS — R278 Other lack of coordination: Secondary | ICD-10-CM

## 2022-02-09 DIAGNOSIS — R2689 Other abnormalities of gait and mobility: Secondary | ICD-10-CM

## 2022-02-09 NOTE — Therapy (Signed)
[place] ?[facility] ?[place], [place], [address] ?Phone: [phone]   Fax:  [phone] ? ?Physical Therapy Treatment ? ?Patient Details  ?Name: Molly Bean ?MRN: [id number] ?Date of Birth: 1961-02-01 ?No data recorded ? ?Encounter Date: 02/09/2022 ? ? PT End of Session - 02/09/22 1056   ? ? Visit Number 24   ? Date for PT Re-Evaluation 13/24/40   PN & recert 10/05/70, 02/03/65  ? PT Start Time [phone]   ? PT Stop Time 1004   ? PT Time Calculation (min) 57 min   ? Activity Tolerance Patient tolerated treatment well   ? Behavior During Therapy [facility] for tasks assessed/performed   ? ?  ?  ? ?  ? ? ?Past Medical History:  ?Diagnosis Date  ? Anxiety   ? Constipation   ? DEPRESSION   ? edema lower extremeties   ? GERD   ? Joint pain   ? MENOPAUSAL SYNDROME   ? Multiple food allergies   ? OBESITY   ? Shortness of breath   ? Sleep apnea   ? wears CPAP  ? ? ?Past Surgical History:  ?Procedure Laterality Date  ? ABDOMINAL HYSTERECTOMY  11/1998  ? total fibroids  ? CESAREAN SECTION    ? ? ?There were no vitals filed for this visit. ? ? Subjective Assessment - 02/09/22 1054   ? ? Subjective Pt only walked twice this week and was not able to do her stretches. Pt tried to practice relaxation because she felt so good after b eing guided through relaxation.   ? Pertinent History C-section, abdominal hysterectomy   ? Patient Stated Goals decrease leakage, have sex and get through pelvic exams without pain   ? ?  ?  ? ?  ? ? ? ? ? ? ? ? ? ? ? ? ? ? ? ? ? Pelvic Floor Special Questions - 02/09/22 1055   ? ? Pelvic Floor Internal Exam pt consented verbally and had no contraindications   ? Exam Type Vaginal   ? Palpation tightness at deepest layer 11, 1, 3-5 o'clock   ? Strength fair squeeze, definite lift   proper lengthening  ? ?  ?  ? ?  ? ? ? ? [facility] Adult PT Treatment/Exercise - 02/09/22 1055   ? ?  ? Therapeutic Activites   ? Other Therapeutic Activities explained the role  of nn system on pelvic floor tensions and maintaining relaxation practice   ?  ? Neuro Re-ed   ? Neuro Re-ed Details  cued for stretches   ?  ? Modalities  ? Modalities Moist Heat   ?  ? Moist Heat Therapy  ? Number Minutes Moist Heat 10 Minutes   ? Moist Heat Location --   perineum in butterfly pose, guided body scan  ?  ? Manual Therapy  ? Internal Pelvic Floor STM/MWM at problem areas noted in assessment for optimal lengthening   ? ?  ?  ? ?  ? ? ? ? ? ? ? ? ? ? ? ? ? ? ? PT Long Term Goals - 02/03/22 1347   ? ?  ? PT LONG TERM GOAL #1  ? Title Pt will demo increased hip abduction strength from 3/5 to >4/5 in order to maintain walking routine and pelvic stability   ? Time 8   ? Period Weeks   ? Status Achieved   ? Target Date 02/03/22   ?  ? PT  LONG TERM GOAL #2  ? Title Pt will demo proper deepcore coordination and pelvic floor movement with no cues to progress to kegel exercises   ? Time 4   ? Period Weeks   ? Status Achieved   ? Target Date 02/03/22   ?  ? PT LONG TERM GOAL #3  ? Title Pt will demo decreased abdominal scar restrictions to advance to deep core exercises and promote IAP for continence and bowel movement elimination with less dependence on Linzess   ? Time 4   ? Period Weeks   ? Status Achieved   ? Target Date 02/03/22   ?  ? PT LONG TERM GOAL #4  ? Title Pt will increase FOTO score for constipation from 41 pts and Urinary 47 pts to > 55 pts in order to demo improved pelvic floor function  ( 06/15/21: no change urinary, 2 pt change improvement  with bowel function,  11/25/21: neg scores of change, need to readminster because pt has reported improvements  )   ? Time 10   ? Period Weeks   ? Status On-going   ? Target Date 04/14/22   ?  ? PT LONG TERM GOAL #5  ? Title Pt will demo decreased pelvic floor mm tightness and proper relaxation of mm to have less pain with intercourcse and gynecological exams   ? Time 10   ? Period Weeks   ? Status Achieved   ? Target Date 02/03/22   ?  ? Additional Long  Term Goals  ? Additional Long Term Goals Yes   ?  ? PT LONG TERM GOAL #6  ? Title Pt will demo no pelvic floor tightness/ tendernessacorss 2 sessions, demo proper quick contractions > 5 reps and long holds 3sec, 5 reps without cues ino rder to gain continence   ? Time 6   ? Period Weeks   ? Status Partially Met   ? Target Date 03/17/22   ?  ? PT LONG TERM GOAL #7  ? Title Pt will report dry pad with walking across 2 days in order to be continence with walking routine   ? Time 10   ? Period Weeks   ? Status New   ? Target Date 04/14/22   ? ?  ?  ? ?  ? ? ? ? ? ? ? ? Plan - 02/09/22 1057   ? ? Clinical Impression Statement Pt required more internal pelvic floor Tx to minimize tightness of mm. Pt required less cues for coordination. Pt required relaxation practice and was explained the role of nn system on pelvic floor tightness/ lengthening/ pain. Pt was guided again through relaxation / body scan mindfulness practice. Pt continues to benefit from skilled PT. Plan to add cardio and fitness and yoga in upcoming sessions.  ? Examination-Activity Limitations Toileting;Continence;Other   ? Stability/Clinical Decision Making Evolving/Moderate complexity   ? Rehab Potential Good   ? PT Frequency 1x / week   ? PT Duration Other (comment)   10  ? PT Treatment/Interventions Neuromuscular re-education;Cryotherapy;ADLs/Self Care Home Management;Therapeutic activities;Functional mobility training;Traction;Moist Heat;Stair training;Gait training;Patient/family education;Taping;Manual techniques;Scar mobilization;Balance training;Therapeutic exercise;Spinal Manipulations;Joint Manipulations;Energy conservation   ? Consulted and Agree with Plan of Care Patient   ? ?  ?  ? ?  ? ? ?Patient will benefit from skilled therapeutic intervention in order to improve the following deficits and impairments:  Decreased endurance, Decreased safety awareness, Difficulty walking, Decreased coordination, Abnormal gait, Improper body mechanics,  Pain, Increased muscle spasms,  Hypermobility, Hypomobility, Decreased scar mobility, Decreased mobility, Decreased strength, Postural dysfunction ? ?Visit Diagnosis: ?Stress incontinence of urine ? ?Other abnormalities of gait and mobility ? ?Other lack of coordination ? ? ? ? ?Problem List ?Patient Active Problem List  ? Diagnosis Date Noted  ? Axillary hidradenitis suppurativa 07/07/2021  ? Proteus mirabilis infection 01/16/2021  ? Cutaneous abscess of right axilla 01/13/2021  ? Iron deficiency anemia 12/10/2020  ? At risk for side effect of medication 12/10/2020  ? Other constipation 08/19/2020  ? Prediabetes 08/06/2020  ? Chronic constipation 08/06/2020  ? Other hyperlipidemia 08/06/2020  ? Vitamin D deficiency 08/06/2020  ? Vaginitis 04/09/2020  ? Essential hypertension 05/01/2018  ? Asthma 03/14/2018  ? Hidradenitis suppurativa 01/18/2018  ? GAD (generalized anxiety disorder) 10/11/2016  ? MENOPAUSAL SYNDROME 04/23/2009  ? OBESITY 02/15/2008  ? Mood disorder ([place]) 04/12/2007  ? Gastroesophageal reflux disease 04/12/2007  ? ? ?Jerl Mina, PT ?02/09/2022, 10:57 AM ? ?[place] ?[facility] ?[place], [place], [address] ?Phone: [phone]   Fax:  [phone] ? ?Name: Molly Bean ?MRN: [id number] ?Date of Birth: October 26, 1960 ? ? ? ?

## 2022-02-14 ENCOUNTER — Other Ambulatory Visit (INDEPENDENT_AMBULATORY_CARE_PROVIDER_SITE_OTHER): Payer: Self-pay | Admitting: Family Medicine

## 2022-02-14 DIAGNOSIS — R7303 Prediabetes: Secondary | ICD-10-CM

## 2022-02-14 DIAGNOSIS — E7849 Other hyperlipidemia: Secondary | ICD-10-CM

## 2022-02-16 ENCOUNTER — Ambulatory Visit: Payer: BC Managed Care – PPO | Admitting: Physical Therapy

## 2022-02-16 DIAGNOSIS — N393 Stress incontinence (female) (male): Secondary | ICD-10-CM

## 2022-02-16 DIAGNOSIS — R278 Other lack of coordination: Secondary | ICD-10-CM

## 2022-02-16 DIAGNOSIS — R2689 Other abnormalities of gait and mobility: Secondary | ICD-10-CM

## 2022-02-16 NOTE — Progress Notes (Signed)
? ? ? ?Chief Complaint:  ? ?OBESITY ?Molly Bean is here to discuss her progress with her obesity treatment plan along with follow-up of her obesity related diagnoses. Molly Bean is on the Category 1 Plan and states she is following her eating plan approximately 90% of the time. Molly Bean states she is walking and doing stretches for 60 minutes 7 times per week. ? ?Today's visit was #: 39 ?Starting weight: 240 lbs ?Starting date: 04/15/2020 ?Today's weight: 210 lbs ?Today's date: 01/28/2022 ?Total lbs lost to date: 23 ?Total lbs lost since last in-office visit: 0 ? ?Interim History: Molly Bean is here for a follow up office visit.  We reviewed her meal plan and all questions were answered.  Patient's food recall appears to be accurate and consistent with what is on plan when she is following it.   When  eating on plan, her hunger and cravings are well controlled. Doing great with no complaints.    ? ?Subjective:  ? ?1. Prediabetes ?A1c was 5.7 approximately 2 months ago. Taking 1 mg Ozempic and metformin. No concerns. No cravings or hunger.  ? ?2. Vitamin D deficiency ?Vitamin D level 2 months ago was 30.5 when she forgot to take it regularly. Prior was 47.7 with medication compliance. ? ?3. At risk for impaired metabolic function ?Molly Bean is at increased risk for impaired metabolic function due to pre-diabetes.  ? ?Assessment/Plan:  ?No orders of the defined types were placed in this encounter. ? ? ?Medications Discontinued During This Encounter  ?Medication Reason  ? Vitamin D, Ergocalciferol, (DRISDOL) 1.25 MG (50000 UNIT) CAPS capsule Reorder  ?  ? ?Meds ordered this encounter  ?Medications  ? Vitamin D, Ergocalciferol, (DRISDOL) 1.25 MG (50000 UNIT) CAPS capsule  ?  Sig: 1 po q wed, and 1 po q sun  ?  Dispense:  8 capsule  ?  Refill:  0  ?  Ov for rf  ?  ? ?1. Prediabetes ?Continue medications, declines the need for a refill. Continue to increase activity and follow her prudent nutritional plan. ? ?2. Vitamin D  deficiency ?Low Vitamin D level contributes to fatigue and are associated with obesity, breast, and colon cancer. We will refill prescription Vitamin D for 1 month. Molly Bean will follow-up for routine testing of Vitamin D, at least 2-3 times per year to avoid over-replacement. ? ?- Vitamin D, Ergocalciferol, (DRISDOL) 1.25 MG (50000 UNIT) CAPS capsule; 1 po q wed, and 1 po q sun  Dispense: 8 capsule; Refill: 0 ? ?3. At risk for impaired metabolic function ?Molly Bean was given approximately 9 minutes of impaired  metabolic function prevention counseling today. We discussed intensive lifestyle modifications today with an emphasis on specific nutrition and exercise instructions and strategies.  ? ?Repetitive spaced learning was employed today to elicit superior memory formation and behavioral change. ? ?4. Obesity with current BMI of 38.6 ?Molly Bean is currently in the action stage of change. As such, her goal is to continue with weight loss efforts. She has agreed to the Category 1 Plan.  ? ?Exercise goals: As is. ? ?Behavioral modification strategies: decreasing simple carbohydrates, increasing water intake, and decreasing eating out. ? ?Molly Bean has agreed to follow-up with our clinic in 3 to 4 weeks. She was informed of the importance of frequent follow-up visits to maximize her success with intensive lifestyle modifications for her multiple health conditions.  ? ?Objective:  ? ?Blood pressure 114/69, pulse 76, temperature 98 ?F (36.7 ?C), height '5\' 2"'$  (1.575 m), weight 210 lb (95.3 kg),  SpO2 98 %. ?Body mass index is 38.41 kg/m?. ? ?General: Cooperative, alert, well developed, in no acute distress. ?HEENT: Conjunctivae and lids unremarkable. ?Cardiovascular: Regular rhythm.  ?Lungs: Normal work of breathing. ?Neurologic: No focal deficits.  ? ?Lab Results  ?Component Value Date  ? CREATININE 0.89 11/03/2021  ? BUN 8 11/03/2021  ? NA 142 11/03/2021  ? K 4.4 11/03/2021  ? CL 102 11/03/2021  ? CO2 25 11/03/2021  ? ?Lab  Results  ?Component Value Date  ? ALT 10 11/03/2021  ? AST 15 11/03/2021  ? ALKPHOS 90 11/03/2021  ? BILITOT 0.3 11/03/2021  ? ?Lab Results  ?Component Value Date  ? HGBA1C 5.7 (H) 11/03/2021  ? HGBA1C 5.9 (H) 07/01/2021  ? HGBA1C 6.0 (H) 03/23/2021  ? HGBA1C 6.2 (H) 12/30/2020  ? HGBA1C 6.1 (H) 08/19/2020  ? ?Lab Results  ?Component Value Date  ? INSULIN 22.5 11/03/2021  ? INSULIN 21.1 03/23/2021  ? INSULIN 21.9 04/15/2020  ? ?Lab Results  ?Component Value Date  ? TSH 1.100 04/15/2020  ? ?Lab Results  ?Component Value Date  ? CHOL 170 11/03/2021  ? HDL 62 11/03/2021  ? [place] 97 11/03/2021  ? LDLDIRECT 95 12/30/2020  ? TRIG 58 11/03/2021  ? CHOLHDL 2.9 07/01/2021  ? ?Lab Results  ?Component Value Date  ? VD25OH 30.5 11/03/2021  ? VD25OH 47.7 05/20/2021  ? VD25OH 46.9 12/30/2020  ? ?Lab Results  ?Component Value Date  ? WBC 6.4 11/03/2021  ? HGB 12.4 11/03/2021  ? HCT 40.8 11/03/2021  ? MCV 69 (L) 11/03/2021  ? PLT 235 11/03/2021  ? ?Lab Results  ?Component Value Date  ? IRON 63 11/03/2021  ? TIBC 332 11/03/2021  ? FERRITIN 72 11/03/2021  ? ?Attestation Statements:  ? ?Reviewed by clinician on day of visit: allergies, medications, problem list, medical history, surgical history, family history, social history, and previous encounter notes. ? ? ?I, Trixie Dredge, am acting as transcriptionist for [employer], DO. ? ?I have reviewed the above documentation for accuracy and completeness, and I agree with the above. Marjory Sneddon, D.O. ? ?The [place] was signed into law in 2016 which includes the topic of electronic health records.  This provides immediate access to information in MyChart.  This includes consultation notes, operative notes, office notes, lab results and pathology reports.  If you have any questions about what you read please let us know at your next visit so we can discuss your concerns and take corrective action if need be.  We are right here with you. ? ? ?

## 2022-02-16 NOTE — Therapy (Signed)
[facility] ?[place], [place], [address] ?Phone: [phone]   Fax:  [phone] ? ?Physical Therapy Treatment ? ?Patient Details  ?Name: Molly Bean ?MRN: [id number] ?Date of Birth: 04/03/1961 ?No data recorded ? ?Encounter Date: 02/16/2022 ? ? PT End of Session - 02/16/22 1553   ? ? Visit Number 25   ? Date for PT Re-Evaluation 12/87/86   PN & recert 7/67/20, 06/07/69  ? PT Start Time [id number]   ? PT Stop Time 1000   ? PT Time Calculation (min) 56 min   ? Activity Tolerance Patient tolerated treatment well   ? Behavior During Therapy [facility] for tasks assessed/performed   ? ?  ?  ? ?  ? ? ?Past Medical History:  ?Diagnosis Date  ? Anxiety   ? Constipation   ? DEPRESSION   ? edema lower extremeties   ? GERD   ? Joint pain   ? MENOPAUSAL SYNDROME   ? Multiple food allergies   ? OBESITY   ? Shortness of breath   ? Sleep apnea   ? wears CPAP  ? ? ?Past Surgical History:  ?Procedure Laterality Date  ? ABDOMINAL HYSTERECTOMY  11/1998  ? total fibroids  ? CESAREAN SECTION    ? ? ?There were no vitals filed for this visit. ? ? Subjective Assessment - 02/16/22 1550   ? ? Subjective Pt has been practicing relaxation. pt had a stressful event occur and she noticed she had a headache and her body tensed up. Pt remembered to breath and practice her exercises and she felt calmer.   ? Pertinent History C-section, abdominal hysterectomy   ? Patient Stated Goals decrease leakage, have sex and get through pelvic exams without pain   ? ?  ?  ? ?  ? ? ? ? ? [facility] PT Assessment - 02/16/22 1858   ? ?  ? Observation/Other Assessments  ? Observations less forward head posture, less upper trap mm tightness   ? ?  ?  ? ?  ? ? ? ? ? ? ? ? ? ? ? ? ? ? ? ? [facility] Adult PT Treatment/Exercise - 02/16/22 1859   ? ?  ? Therapeutic Activites   ? Other Therapeutic Activities explained role of pelvic floor for sexual function, role of nervous system, pain science retraining   ? ?  ?  ? ?   ? ? ? ? ? ? ? ? ? ? ? ? ? ? ? PT Long Term Goals - 02/03/22 1347   ? ?  ? PT LONG TERM GOAL #1  ? Title Pt will demo increased hip abduction strength from 3/5 to >4/5 in order to maintain walking routine and pelvic stability   ? Time 8   ? Period Weeks   ? Status Achieved   ? Target Date 02/03/22   ?  ? PT LONG TERM GOAL #2  ? Title Pt will demo proper deepcore coordination and pelvic floor movement with no cues to progress to kegel exercises   ? Time 4   ? Period Weeks   ? Status Achieved   ? Target Date 02/03/22   ?  ? PT LONG TERM GOAL #3  ? Title Pt will demo decreased abdominal scar restrictions to advance to deep core exercises and promote IAP for continence and bowel movement elimination with less dependence on Linzess   ? Time 4   ? Period Weeks   ? Status  Achieved   ? Target Date 02/03/22   ?  ? PT LONG TERM GOAL #4  ? Title Pt will increase FOTO score for constipation from 41 pts and Urinary 47 pts to > 55 pts in order to demo improved pelvic floor function  ( 06/15/21: no change urinary, 2 pt change improvement  with bowel function,  11/25/21: neg scores of change, need to readminster because pt has reported improvements  )   ? Time 10   ? Period Weeks   ? Status On-going   ? Target Date 04/14/22   ?  ? PT LONG TERM GOAL #5  ? Title Pt will demo decreased pelvic floor mm tightness and proper relaxation of mm to have less pain with intercourcse and gynecological exams   ? Time 10   ? Period Weeks   ? Status Achieved   ? Target Date 02/03/22   ?  ? Additional Long Term Goals  ? Additional Long Term Goals Yes   ?  ? PT LONG TERM GOAL #6  ? Title Pt will demo no pelvic floor tightness/ tendernessacorss 2 sessions, demo proper quick contractions > 5 reps and long holds 3sec, 5 reps without cues ino rder to gain continence   ? Time 6   ? Period Weeks   ? Status Partially Met   ? Target Date 03/17/22   ?  ? PT LONG TERM GOAL #7  ? Title Pt will report dry pad with walking across 2 days in order to be continence  with walking routine   ? Time 10   ? Period Weeks   ? Status New   ? Target Date 04/14/22   ? ?  ?  ? ?  ? ? ? ? ? ? ? ? Plan - 02/16/22 1554   ? ? Clinical Impression Statement Pt required explanation on the role of pelvic floor for sexual function, role of nervous system, pain science retraining. Pt expressed she continues to practice relaxation and breathing when stressed. This indicates pt will continue to progress well towards pain management and relaxing pelvic floor to minimize leakage. Pt continues to benefit from skilled PT.   ? Examination-Activity Limitations Toileting;Continence;Other   ? Stability/Clinical Decision Making Evolving/Moderate complexity   ? Rehab Potential Good   ? PT Frequency 1x / week   ? PT Duration Other (comment)   10  ? PT Treatment/Interventions Neuromuscular re-education;Cryotherapy;ADLs/Self Care Home Management;Therapeutic activities;Functional mobility training;Traction;Moist Heat;Stair training;Gait training;Patient/family education;Taping;Manual techniques;Scar mobilization;Balance training;Therapeutic exercise;Spinal Manipulations;Joint Manipulations;Energy conservation   ? Consulted and Agree with Plan of Care Patient   ? ?  ?  ? ?  ? ? ?Patient will benefit from skilled therapeutic intervention in order to improve the following deficits and impairments:  Decreased endurance, Decreased safety awareness, Difficulty walking, Decreased coordination, Abnormal gait, Improper body mechanics, Pain, Increased muscle spasms, Hypermobility, Hypomobility, Decreased scar mobility, Decreased mobility, Decreased strength, Postural dysfunction ? ?Visit Diagnosis: ?Stress incontinence of urine ? ?Other abnormalities of gait and mobility ? ?Other lack of coordination ? ? ? ? ?Problem List ?Patient Active Problem List  ? Diagnosis Date Noted  ? Axillary hidradenitis suppurativa 07/07/2021  ? Proteus mirabilis infection 01/16/2021  ? Cutaneous abscess of right axilla 01/13/2021  ? Iron  deficiency anemia 12/10/2020  ? At risk for side effect of medication 12/10/2020  ? Other constipation 08/19/2020  ? Prediabetes 08/06/2020  ? Chronic constipation 08/06/2020  ? Other hyperlipidemia 08/06/2020  ? Vitamin D deficiency 08/06/2020  ? Vaginitis  04/09/2020  ? Essential hypertension 05/01/2018  ? Asthma 03/14/2018  ? Hidradenitis suppurativa 01/18/2018  ? GAD (generalized anxiety disorder) 10/11/2016  ? MENOPAUSAL SYNDROME 04/23/2009  ? OBESITY 02/15/2008  ? Mood disorder ([place]) 04/12/2007  ? Gastroesophageal reflux disease 04/12/2007  ? ? ?Jerl Mina, PT ?02/16/2022, 7:02 PM ? ?[facility] ?[place], [place], [address] ?Phone: [phone]   Fax:  [phone] ? ?Name: Molly Bean ?MRN: [id number] ?Date of Birth: 08-27-1961 ? ? ? ?

## 2022-02-23 ENCOUNTER — Ambulatory Visit: Payer: BC Managed Care – PPO | Admitting: Physical Therapy

## 2022-02-24 ENCOUNTER — Ambulatory Visit (INDEPENDENT_AMBULATORY_CARE_PROVIDER_SITE_OTHER): Payer: BC Managed Care – PPO | Admitting: Family Medicine

## 2022-03-03 ENCOUNTER — Ambulatory Visit: Payer: BC Managed Care – PPO | Admitting: Physical Therapy

## 2022-03-03 DIAGNOSIS — R278 Other lack of coordination: Secondary | ICD-10-CM

## 2022-03-03 DIAGNOSIS — N393 Stress incontinence (female) (male): Secondary | ICD-10-CM

## 2022-03-03 DIAGNOSIS — R2689 Other abnormalities of gait and mobility: Secondary | ICD-10-CM

## 2022-03-03 NOTE — Therapy (Signed)
[facility] [address] [place], [place], [address] Phone: [phone]   Fax:  [phone]  Physical Therapy Treatment  Patient Details  Name: Molly Bean MRN: [id number] Date of Birth: 14-Aug-1961 No data recorded  Encounter Date: 03/03/2022   PT End of Session - 03/03/22 0910     Visit Number 26    Date for PT Re-Evaluation 76/81/15   PN & recert 04/29/19, 12/06/57   PT Start Time 0905    PT Stop Time 1000    PT Time Calculation (min) 55 min    Activity Tolerance Patient tolerated treatment well    Behavior During Therapy [facility] for tasks assessed/performed             Past Medical History:  Diagnosis Date   Anxiety    Constipation    DEPRESSION    edema lower extremeties    GERD    Joint pain    MENOPAUSAL SYNDROME    Multiple food allergies    OBESITY    Shortness of breath    Sleep apnea    wears CPAP    Past Surgical History:  Procedure Laterality Date   ABDOMINAL HYSTERECTOMY  11/1998   total fibroids   CESAREAN SECTION      There were no vitals filed for this visit.   Subjective Assessment - 03/03/22 1002     Subjective Pt reports she is still leaking    Pertinent History C-section, abdominal hysterectomy    Patient Stated Goals decrease leakage, have sex and get through pelvic exams without pain                [facility] PT Assessment - 03/03/22 0959       Squat   Comments cued for alignment                        Pelvic Floor Special Questions - 03/03/22 0959     Pelvic Floor Internal Exam pt consented verbally and had no contraindications    Exam Type Vaginal    Palpation tightness at deepest layer  1, 3-5 o'clock    Strength fair squeeze, definite lift   cued for lengthening, less chest breathing, dycoordination              [facility] Adult PT Treatment/Exercise - 03/03/22 1000       Neuro Re-ed    Neuro Re-ed Details  cued for proper lengthening and less  pushing downward of deep core , cue for mini squats alignment and coordination of deep core      Modalities   Modalities Moist Heat      Moist Heat Therapy   Number Minutes Moist Heat 5 Minutes    Moist Heat Location --   abdominal for tactile cue for coordination training, at perineum for relaxation of mm ( through sheets), provided guided coordination training with visual cues and tactile cues     Manual Therapy   Internal Pelvic Floor STM/MWM light pressure to modify for her tenderness./ pain to address tightness at  problem areas noted in assessment                          PT Long Term Goals - 02/03/22 1347       PT LONG TERM GOAL #1   Title Pt will demo increased hip abduction strength from 3/5 to >4/5 in order to maintain walking  routine and pelvic stability    Time 8    Period Weeks    Status Achieved    Target Date 02/03/22      PT LONG TERM GOAL #2   Title Pt will demo proper deepcore coordination and pelvic floor movement with no cues to progress to kegel exercises    Time 4    Period Weeks    Status Achieved    Target Date 02/03/22      PT LONG TERM GOAL #3   Title Pt will demo decreased abdominal scar restrictions to advance to deep core exercises and promote IAP for continence and bowel movement elimination with less dependence on Linzess    Time 4    Period Weeks    Status Achieved    Target Date 02/03/22      PT LONG TERM GOAL #4   Title Pt will increase FOTO score for constipation from 41 pts and Urinary 47 pts to > 55 pts in order to demo improved pelvic floor function  ( 06/15/21: no change urinary, 2 pt change improvement  with bowel function,  11/25/21: neg scores of change, need to readminster because pt has reported improvements  )    Time 10    Period Weeks    Status On-going    Target Date 04/14/22      PT LONG TERM GOAL #5   Title Pt will demo decreased pelvic floor mm tightness and proper relaxation of mm to have less pain with  intercourcse and gynecological exams    Time 10    Period Weeks    Status Achieved    Target Date 02/03/22      Additional Long Term Goals   Additional Long Term Goals Yes      PT LONG TERM GOAL #6   Title Pt will demo no pelvic floor tightness/ tendernessacorss 2 sessions, demo proper quick contractions > 5 reps and long holds 3sec, 5 reps without cues ino rder to gain continence    Time 6    Period Weeks    Status Partially Met    Target Date 03/17/22      PT LONG TERM GOAL #7   Title Pt will report dry pad with walking across 2 days in order to be continence with walking routine    Time 10    Period Weeks    Status New    Target Date 04/14/22                   Plan - 03/03/22 1001     Clinical Impression Statement Pt continued require manual Tx to minimize L pelvic floor tightness. Pt required cues for proper lengthening of pelvic floor and to correct dyscoordination pelvic floor associated with leakage. Still withholding kegels until pt shows no more pelvic floor tightness. Pt demo'd correct coordination of pelvic floor with lengthening and upward movement after training. Added mini squat to lengthen pelvic floor while gradually progress pt to fitness. Pt demo'd correct mini squat technique with cues.  Pt continues to benefit from skilled PT.    Examination-Activity Limitations Toileting;Continence;Other    Stability/Clinical Decision Making Evolving/Moderate complexity    Rehab Potential Good    PT Frequency 1x / week    PT Duration Other (comment)   10   PT Treatment/Interventions Neuromuscular re-education;Cryotherapy;ADLs/Self Care Home Management;Therapeutic activities;Functional mobility training;Traction;Moist Heat;Stair training;Gait training;Patient/family education;Taping;Manual techniques;Scar mobilization;Balance training;Therapeutic exercise;Spinal Manipulations;Joint Manipulations;Energy conservation    Consulted and Agree with Plan of  Care Patient              Patient will benefit from skilled therapeutic intervention in order to improve the following deficits and impairments:  Decreased endurance, Decreased safety awareness, Difficulty walking, Decreased coordination, Abnormal gait, Improper body mechanics, Pain, Increased muscle spasms, Hypermobility, Hypomobility, Decreased scar mobility, Decreased mobility, Decreased strength, Postural dysfunction  Visit Diagnosis: Stress incontinence of urine  Other abnormalities of gait and mobility  Other lack of coordination     Problem List Patient Active Problem List   Diagnosis Date Noted   Axillary hidradenitis suppurativa 07/07/2021   Proteus mirabilis infection 01/16/2021   Cutaneous abscess of right axilla 01/13/2021   Iron deficiency anemia 12/10/2020   At risk for side effect of medication 12/10/2020   Other constipation 08/19/2020   Prediabetes 08/06/2020   Chronic constipation 08/06/2020   Other hyperlipidemia 08/06/2020   Vitamin D deficiency 08/06/2020   Vaginitis 04/09/2020   Essential hypertension 05/01/2018   Asthma 03/14/2018   Hidradenitis suppurativa 01/18/2018   GAD (generalized anxiety disorder) 10/11/2016   MENOPAUSAL SYNDROME 04/23/2009   OBESITY 02/15/2008   Mood disorder ([place]) 04/12/2007   Gastroesophageal reflux disease 04/12/2007    Jerl Mina, PT 03/03/2022, 10:03 AM  [facility] [address] [place], [place], [address] Phone: [phone]   Fax:  [phone]  Name: Edit Ricciardelli MRN: [id number] Date of Birth: May 05, 1961

## 2022-03-03 NOTE — Patient Instructions (Addendum)
Practice proper pelvic floor coordination  Inhale: expand pelvic floor muscles Exhale" "j" scoop, allow pelvic floor to close, lift first before belly sinks   ( not "draw abdominal muscle to spine" or strain with abdominal muscles")  __  Minisquat: Scoot buttocks back slight, hinge like you are looking at your reflection on a pond  Knees behind toes,  Inhale to "smell flowers"  Exhale on the rise "like rocket"  Do not lock knees, have more weight across ballmounds of feet, toes relaxed   Add the movement of the hands to remind opening / expansion on inhale, squat   10 reps x 3 x day

## 2022-03-04 ENCOUNTER — Ambulatory Visit (INDEPENDENT_AMBULATORY_CARE_PROVIDER_SITE_OTHER): Payer: BC Managed Care – PPO | Admitting: Family Medicine

## 2022-03-04 ENCOUNTER — Encounter (INDEPENDENT_AMBULATORY_CARE_PROVIDER_SITE_OTHER): Payer: Self-pay | Admitting: Family Medicine

## 2022-03-04 VITALS — BP 112/72 | HR 70 | Temp 98.3°F | Ht 62.0 in | Wt 207.0 lb

## 2022-03-04 DIAGNOSIS — E669 Obesity, unspecified: Secondary | ICD-10-CM | POA: Diagnosis not present

## 2022-03-04 DIAGNOSIS — Z6837 Body mass index (BMI) 37.0-37.9, adult: Secondary | ICD-10-CM

## 2022-03-04 DIAGNOSIS — E559 Vitamin D deficiency, unspecified: Secondary | ICD-10-CM

## 2022-03-04 DIAGNOSIS — K5909 Other constipation: Secondary | ICD-10-CM | POA: Diagnosis not present

## 2022-03-04 DIAGNOSIS — R7303 Prediabetes: Secondary | ICD-10-CM

## 2022-03-04 MED ORDER — VITAMIN D (ERGOCALCIFEROL) 1.25 MG (50000 UNIT) PO CAPS: ORAL_CAPSULE | ORAL | 0 refills | Status: DC

## 2022-03-04 MED ORDER — SEMAGLUTIDE (1 MG/DOSE) 4 MG/3ML ~~LOC~~ SOPN: 1.0000 mg | PEN_INJECTOR | SUBCUTANEOUS | 0 refills | Status: DC

## 2022-03-04 MED ORDER — LINACLOTIDE 145 MCG PO CAPS: 145.0000 ug | ORAL_CAPSULE | Freq: Every day | ORAL | 0 refills | Status: DC

## 2022-03-04 MED ORDER — METFORMIN HCL 500 MG PO TABS: 500.0000 mg | ORAL_TABLET | Freq: Two times a day (BID) | ORAL | 0 refills | Status: DC

## 2022-03-09 ENCOUNTER — Ambulatory Visit: Payer: BC Managed Care – PPO | Attending: Obstetrics and Gynecology | Admitting: Physical Therapy

## 2022-03-09 DIAGNOSIS — N393 Stress incontinence (female) (male): Secondary | ICD-10-CM | POA: Diagnosis present

## 2022-03-09 DIAGNOSIS — R278 Other lack of coordination: Secondary | ICD-10-CM | POA: Diagnosis present

## 2022-03-09 DIAGNOSIS — R2689 Other abnormalities of gait and mobility: Secondary | ICD-10-CM | POA: Insufficient documentation

## 2022-03-09 NOTE — Therapy (Signed)
[place] MAIN [facility] SERVICES [address] [place], [place], [address] Phone: [phone]   Fax:  [phone]  Physical Therapy Treatment  Patient Details  Name: Molly Bean MRN: [id number] Date of Birth: 1961/04/07 No data recorded  Encounter Date: 03/09/2022   PT End of Session - 03/09/22 0907     Visit Number 27    Date for PT Re-Evaluation 93/71/69   PN & recert 6/78/93, 05/04/00   PT Start Time 0905    PT Stop Time 1004    PT Time Calculation (min) 59 min    Activity Tolerance Patient tolerated treatment well    Behavior During Therapy [facility] for tasks assessed/performed             Past Medical History:  Diagnosis Date   Anxiety    Constipation    DEPRESSION    edema lower extremeties    GERD    Joint pain    MENOPAUSAL SYNDROME    Multiple food allergies    OBESITY    Shortness of breath    Sleep apnea    wears CPAP    Past Surgical History:  Procedure Laterality Date   ABDOMINAL HYSTERECTOMY  11/1998   total fibroids   CESAREAN SECTION      There were no vitals filed for this visit.   Subjective Assessment - 03/09/22 0907     Subjective pt didnt do any walking. pt spent time with her mom. pt noticed more leakage when doing more housework. pt has a front loader dryer and she bends every time to each get one piece of clothing out  out and folds each piece.    Pertinent History C-section, abdominal hysterectomy    Patient Stated Goals decrease leakage, have sex and get through pelvic exams without pain                            Pelvic Floor Special Questions - 03/09/22 0955     Pelvic Floor Internal Exam pt consented verbally and had no contraindications    Exam Type Vaginal    Palpation tightness / tenderness at deepest layer  11-12 o'clock, 5-6 olcock by coccyx L    Strength fair squeeze, definite lift   cued for lengthening              [facility] Adult PT Treatment/Exercise -  03/09/22 0957       Neuro Re-ed    Neuro Re-ed Details  cued for proper lengthening, anterior tilt of pelvis and extension of coccyx      Modalities   Modalities Moist Heat      Moist Heat Therapy   Moist Heat Location --   in childs posed supported to promote extension of coccyx  ( unbilled) moist pack at perineum     Manual Therapy   Internal Pelvic Floor STM/MWM light pressure to modify for her tenderness./ pain to address tightness at  problem areas noted in assessment to promote more lengthening                          PT Long Term Goals - 02/03/22 1347       PT LONG TERM GOAL #1   Title Pt will demo increased hip abduction strength from 3/5 to >4/5 in order to maintain walking routine and pelvic stability    Time 8    Period Weeks  Status Achieved    Target Date 02/03/22      PT LONG TERM GOAL #2   Title Pt will demo proper deepcore coordination and pelvic floor movement with no cues to progress to kegel exercises    Time 4    Period Weeks    Status Achieved    Target Date 02/03/22      PT LONG TERM GOAL #3   Title Pt will demo decreased abdominal scar restrictions to advance to deep core exercises and promote IAP for continence and bowel movement elimination with less dependence on Linzess    Time 4    Period Weeks    Status Achieved    Target Date 02/03/22      PT LONG TERM GOAL #4   Title Pt will increase FOTO score for constipation from 41 pts and Urinary 47 pts to > 55 pts in order to demo improved pelvic floor function  ( 06/15/21: no change urinary, 2 pt change improvement  with bowel function,  11/25/21: neg scores of change, need to readminster because pt has reported improvements  )    Time 10    Period Weeks    Status On-going    Target Date 04/14/22      PT LONG TERM GOAL #5   Title Pt will demo decreased pelvic floor mm tightness and proper relaxation of mm to have less pain with intercourcse and gynecological exams    Time 10     Period Weeks    Status Achieved    Target Date 02/03/22      Additional Long Term Goals   Additional Long Term Goals Yes      PT LONG TERM GOAL #6   Title Pt will demo no pelvic floor tightness/ tendernessacorss 2 sessions, demo proper quick contractions > 5 reps and long holds 3sec, 5 reps without cues ino rder to gain continence    Time 6    Period Weeks    Status Partially Met    Target Date 03/17/22      PT LONG TERM GOAL #7   Title Pt will report dry pad with walking across 2 days in order to be continence with walking routine    Time 10    Period Weeks    Status New    Target Date 04/14/22                   Plan - 03/09/22 0907     Clinical Impression Statement Pt showed decreasing tenderness/ tightness of deep pelvic floor mm and more upward position of bladder and urethra. Pt required less cues for chest breathing which shows  good carry over. Pt required more coccyx extension and anterior tilt pelvic girdle with more pelvic floor contraction. Pt demo'd improved lengthening and elicited more circumferential contraction post Tx. However, pt is not able to sense her contraction. Thus, continue to address pelvic floor overactivity with manual Tx next session. Still withholding kegel contraction and training in HEP until pt demo's complete lengthening of pelvic floor and gain awareness of contractions. . Pt continues to benefit from skilled PT.     Examination-Activity Limitations Toileting;Continence;Other    Stability/Clinical Decision Making Evolving/Moderate complexity    Rehab Potential Good    PT Frequency 1x / week    PT Duration Other (comment)   10   PT Treatment/Interventions Neuromuscular re-education;Cryotherapy;ADLs/Self Care Home Management;Therapeutic activities;Functional mobility training;Traction;Moist Heat;Stair training;Gait training;Patient/family education;Taping;Manual techniques;Scar mobilization;Balance training;Therapeutic exercise;Spinal  Manipulations;Joint Manipulations;Energy conservation  Consulted and Agree with Plan of Care Patient             Patient will benefit from skilled therapeutic intervention in order to improve the following deficits and impairments:  Decreased endurance, Decreased safety awareness, Difficulty walking, Decreased coordination, Abnormal gait, Improper body mechanics, Pain, Increased muscle spasms, Hypermobility, Hypomobility, Decreased scar mobility, Decreased mobility, Decreased strength, Postural dysfunction  Visit Diagnosis: Stress incontinence of urine  Other abnormalities of gait and mobility  Other lack of coordination     Problem List Patient Active Problem List   Diagnosis Date Noted   Axillary hidradenitis suppurativa 07/07/2021   Proteus mirabilis infection 01/16/2021   Cutaneous abscess of right axilla 01/13/2021   Iron deficiency anemia 12/10/2020   At risk for side effect of medication 12/10/2020   Other constipation 08/19/2020   Prediabetes 08/06/2020   Chronic constipation 08/06/2020   Other hyperlipidemia 08/06/2020   Vitamin D deficiency 08/06/2020   Vaginitis 04/09/2020   Essential hypertension 05/01/2018   Asthma 03/14/2018   Hidradenitis suppurativa 01/18/2018   GAD (generalized anxiety disorder) 10/11/2016   MENOPAUSAL SYNDROME 04/23/2009   OBESITY 02/15/2008   Mood disorder ([place]) 04/12/2007   Gastroesophageal reflux disease 04/12/2007    Jerl Mina, PT 03/09/2022, 10: [place] MAIN [facility] SERVICES [address] [place], [place], [address] Phone: [phone]   Fax:  [phone]  Name: Molly Bean MRN: [id number] Date of Birth: 08-Nov-1960

## 2022-03-09 NOTE — Patient Instructions (Addendum)
Laundry strategy to minimize leakage: Instead  of bending every time to each get one piece of clothing out  out and folds each piece.  -place laundry basket on a stool  - use another stool to sit  Reach from back of dryer and pull clothes closer to you Then reach laundry out and folding the piece to place into laundry basket without bend   __  Childs pose rocking  Happy baby stretch  ___  Mini squats

## 2022-03-13 NOTE — Progress Notes (Unsigned)
Chief Complaint:   OBESITY Molly Bean is here to discuss her progress with her obesity treatment plan along with follow-up of her obesity related diagnoses. Molly Bean is on the Category 1 Plan and states she is following her eating plan approximately 90% of the time. Molly Bean states she is walking 60 minutes 6 times per week.  Today's visit was #: 28 Starting weight: 240 lbs Starting date: 04/15/2020 Today's weight: 207 lbs Today's date: 03/04/2022 Total lbs lost to date: 33 Total lbs lost since last in-office visit: 3  Interim History: Pt is doing physical therapy and they have her doing lunges and squats 3 days a week. Molly Bean is here for a follow up office visit. We reviewed her meal plan and all questions were answered. Patient's food recall appears to be accurate and consistent with what is on plan when she is following it.   When eating on plan, her hunger and cravings are well controlled.    Subjective:   1. Pre-diabetes Molly Bean has a diagnosis of prediabetes based on her elevated HgA1c and was informed this puts her at greater risk of developing diabetes. She continues to work on diet and exercise to decrease her risk of diabetes. She denies nausea or hypoglycemia.  2. Chronic constipation Pt denies concerns. Medication: Linzess  3. Vitamin D deficiency She is currently taking prescription vitamin D 50,000 IU twice a week. She denies nausea, vomiting or muscle weakness.  Assessment/Plan:  No orders of the defined types were placed in this encounter.   Medications Discontinued During This Encounter  Medication Reason   metFORMIN (GLUCOPHAGE) 500 MG tablet Reorder   Semaglutide, 1 MG/DOSE, 4 MG/3ML SOPN Reorder   linaclotide (LINZESS) 145 MCG CAPS capsule Reorder   Vitamin D, Ergocalciferol, (DRISDOL) 1.25 MG (50000 UNIT) CAPS capsule Reorder     Meds ordered this encounter  Medications   Vitamin D, Ergocalciferol, (DRISDOL) 1.25 MG (50000 UNIT) CAPS capsule     Sig: 1 po q wed, and 1 po q sun    Dispense:  8 capsule    Refill:  0    Ov for rf   linaclotide (LINZESS) 145 MCG CAPS capsule    Sig: Take 1 capsule (145 mcg total) by mouth daily before breakfast.    Dispense:  30 capsule    Refill:  0   metFORMIN (GLUCOPHAGE) 500 MG tablet    Sig: Take 1 tablet (500 mg total) by mouth 2 (two) times daily with a meal.    Dispense:  60 tablet    Refill:  0    Ov for rf   Semaglutide, 1 MG/DOSE, 4 MG/3ML SOPN    Sig: Inject 1 mg as directed once a week.    Dispense:  3 mL    Refill:  0    One mo supply; ov for rf     1. Pre-diabetes Molly Bean will continue to work on weight loss, exercise, and decreasing simple carbohydrates to help decrease the risk of diabetes. Continue current treatment plan with no change in dose.  Refill- metFORMIN (GLUCOPHAGE) 500 MG tablet; Take 1 tablet (500 mg total) by mouth 2 (two) times daily with a meal.  Dispense: 60 tablet; Refill: 0 Refill- Semaglutide, 1 MG/DOSE, 4 MG/3ML SOPN; Inject 1 mg as directed once a week.  Dispense: 3 mL; Refill: 0  2. Chronic constipation Molly Bean was informed that a decrease in bowel movement frequency is normal while losing weight, but stools should not be hard or painful.  Orders and follow up as documented in patient record.   Counseling Getting to Good Bowel Health: Your goal is to have one soft bowel movement each day. Drink at least 8 glasses of water each day. Eat plenty of fiber (goal is over 25 grams each day). It is best to get most of your fiber from dietary sources which includes leafy green vegetables, fresh fruit, and whole grains. You may need to add fiber with the help of OTC fiber supplements. These include Metamucil, Citrucel, and Flaxseed. If you are still having trouble, try adding Miralax or Magnesium Citrate. If all of these changes do not work, Cabin crew.  Refill- linaclotide (LINZESS) 145 MCG CAPS capsule; Take 1 capsule (145 mcg total) by mouth daily  before breakfast.  Dispense: 30 capsule; Refill: 0  3. Vitamin D deficiency Low Vitamin D level contributes to fatigue and are associated with obesity, breast, and colon cancer. She agrees to continue to take prescription Vitamin D '@50'$ ,000 IU twice a week and will follow-up for routine testing of Vitamin D, at least 2-3 times per year to avoid over-replacement.  Refill- Vitamin D, Ergocalciferol, (DRISDOL) 1.25 MG (50000 UNIT) CAPS capsule; 1 po q wed, and 1 po q sun  Dispense: 8 capsule; Refill: 0  4. Obesity with current BMI of 37.9 [name] is currently in the action stage of change. As such, her goal is to continue with weight loss efforts. She has agreed to the Category 1 Plan.   Exercise goals:  As is  Behavioral modification strategies: increasing lean protein intake, decreasing simple carbohydrates, avoiding temptations, and planning for success.  Molly Bean has agreed to follow-up with our clinic in 4 weeks. She was informed of the importance of frequent follow-up visits to maximize her success with intensive lifestyle modifications for her multiple health conditions.   Objective:   Blood pressure 112/72, pulse 70, temperature 98.3 F (36.8 C), height '5\' 2"'$  (1.575 m), weight 207 lb (93.9 kg), SpO2 98 %. Body mass index is 37.86 kg/m.  General: Cooperative, alert, well developed, in no acute distress. HEENT: Conjunctivae and lids unremarkable. Cardiovascular: Regular rhythm.  Lungs: Normal work of breathing. Neurologic: No focal deficits.   Lab Results  Component Value Date   CREATININE 0.89 11/03/2021   BUN 8 11/03/2021   NA 142 11/03/2021   K 4.4 11/03/2021   CL 102 11/03/2021   CO2 25 11/03/2021   Lab Results  Component Value Date   ALT 10 11/03/2021   AST 15 11/03/2021   ALKPHOS 90 11/03/2021   BILITOT 0.3 11/03/2021   Lab Results  Component Value Date   HGBA1C 5.7 (H) 11/03/2021   HGBA1C 5.9 (H) 07/01/2021   HGBA1C 6.0 (H) 03/23/2021   HGBA1C 6.2 (H)  12/30/2020   HGBA1C 6.1 (H) 08/19/2020   Lab Results  Component Value Date   INSULIN 22.5 11/03/2021   INSULIN 21.1 03/23/2021   INSULIN 21.9 04/15/2020   Lab Results  Component Value Date   TSH 1.100 04/15/2020   Lab Results  Component Value Date   CHOL 170 11/03/2021   HDL 62 11/03/2021   LDLCALC 97 11/03/2021   LDLDIRECT 95 12/30/2020   TRIG 58 11/03/2021   CHOLHDL 2.9 07/01/2021   Lab Results  Component Value Date   VD25OH 30.5 11/03/2021   VD25OH 47.7 05/20/2021   VD25OH 46.9 12/30/2020   Lab Results  Component Value Date   WBC 6.4 11/03/2021   HGB 12.4 11/03/2021   HCT 40.8 11/03/2021  MCV 69 (L) 11/03/2021   PLT 235 11/03/2021   Lab Results  Component Value Date   IRON 63 11/03/2021   TIBC 332 11/03/2021   FERRITIN 72 11/03/2021    Attestation Statements:   Reviewed by clinician on day of visit: allergies, medications, problem list, medical history, surgical history, family history, social history, and previous encounter notes.  I, Kathlene November, BS, CMA, am acting as transcriptionist for [employer], DO.  I have reviewed the above documentation for accuracy and completeness, and I agree with the above. Marjory Sneddon, D.O.  The [place] was signed into law in 2016 which includes the topic of electronic health records.  This provides immediate access to information in MyChart.  This includes consultation notes, operative notes, office notes, lab results and pathology reports.  If you have any questions about what you read please let us know at your next visit so we can discuss your concerns and take corrective action if need be.  We are right here with you.

## 2022-03-22 ENCOUNTER — Ambulatory Visit: Payer: BC Managed Care – PPO | Admitting: Physical Therapy

## 2022-03-29 ENCOUNTER — Ambulatory Visit: Payer: BC Managed Care – PPO | Admitting: Physical Therapy

## 2022-03-29 DIAGNOSIS — R278 Other lack of coordination: Secondary | ICD-10-CM

## 2022-03-29 DIAGNOSIS — N393 Stress incontinence (female) (male): Secondary | ICD-10-CM | POA: Diagnosis not present

## 2022-03-29 DIAGNOSIS — R2689 Other abnormalities of gait and mobility: Secondary | ICD-10-CM

## 2022-03-30 ENCOUNTER — Ambulatory Visit (INDEPENDENT_AMBULATORY_CARE_PROVIDER_SITE_OTHER): Payer: BC Managed Care – PPO | Admitting: Family Medicine

## 2022-03-30 ENCOUNTER — Encounter (INDEPENDENT_AMBULATORY_CARE_PROVIDER_SITE_OTHER): Payer: Self-pay | Admitting: Family Medicine

## 2022-03-30 VITALS — BP 108/73 | HR 67 | Temp 98.1°F | Ht 62.0 in | Wt 206.0 lb

## 2022-03-30 DIAGNOSIS — E669 Obesity, unspecified: Secondary | ICD-10-CM | POA: Diagnosis not present

## 2022-03-30 DIAGNOSIS — R7303 Prediabetes: Secondary | ICD-10-CM

## 2022-03-30 DIAGNOSIS — Z9189 Other specified personal risk factors, not elsewhere classified: Secondary | ICD-10-CM

## 2022-03-30 DIAGNOSIS — E559 Vitamin D deficiency, unspecified: Secondary | ICD-10-CM

## 2022-03-30 DIAGNOSIS — K5909 Other constipation: Secondary | ICD-10-CM | POA: Diagnosis not present

## 2022-03-30 DIAGNOSIS — Z7985 Long-term (current) use of injectable non-insulin antidiabetic drugs: Secondary | ICD-10-CM

## 2022-03-30 DIAGNOSIS — Z6837 Body mass index (BMI) 37.0-37.9, adult: Secondary | ICD-10-CM

## 2022-03-30 DIAGNOSIS — Z7984 Long term (current) use of oral hypoglycemic drugs: Secondary | ICD-10-CM

## 2022-03-30 MED ORDER — SEMAGLUTIDE (1 MG/DOSE) 4 MG/3ML ~~LOC~~ SOPN: 1.0000 mg | PEN_INJECTOR | SUBCUTANEOUS | 0 refills | Status: DC

## 2022-03-30 MED ORDER — VITAMIN D (ERGOCALCIFEROL) 1.25 MG (50000 UNIT) PO CAPS
ORAL_CAPSULE | ORAL | 0 refills | Status: DC
Start: 2022-03-30 — End: 2022-04-29

## 2022-03-30 MED ORDER — LINACLOTIDE 145 MCG PO CAPS: ORAL_CAPSULE | ORAL | 0 refills | Status: DC

## 2022-03-31 ENCOUNTER — Other Ambulatory Visit (INDEPENDENT_AMBULATORY_CARE_PROVIDER_SITE_OTHER): Payer: Self-pay | Admitting: Family Medicine

## 2022-03-31 DIAGNOSIS — R7303 Prediabetes: Secondary | ICD-10-CM

## 2022-03-31 DIAGNOSIS — Z9189 Other specified personal risk factors, not elsewhere classified: Secondary | ICD-10-CM | POA: Insufficient documentation

## 2022-03-31 LAB — COMPREHENSIVE METABOLIC PANEL
ALT: 13 IU/L (ref 0–32)
AST: 18 IU/L (ref 0–40)
Albumin/Globulin Ratio: 1.7 (ref 1.2–2.2)
Albumin: 4.3 g/dL (ref 3.8–4.8)
Alkaline Phosphatase: 74 IU/L (ref 44–121)
BUN/Creatinine Ratio: 14 (ref 12–28)
BUN: 11 mg/dL (ref 8–27)
Bilirubin Total: 0.3 mg/dL (ref 0.0–1.2)
CO2: 25 mmol/L (ref 20–29)
Calcium: 9.5 mg/dL (ref 8.7–10.3)
Chloride: 105 mmol/L (ref 96–106)
Creatinine, Ser: 0.8 mg/dL (ref 0.57–1.00)
Globulin, Total: 2.5 g/dL (ref 1.5–4.5)
Glucose: 87 mg/dL (ref 70–99)
Potassium: 4.3 mmol/L (ref 3.5–5.2)
Sodium: 145 mmol/L — ABNORMAL HIGH (ref 134–144)
Total Protein: 6.8 g/dL (ref 6.0–8.5)
eGFR: 83 mL/min/{1.73_m2} (ref >59)

## 2022-03-31 LAB — CBC WITH DIFFERENTIAL/PLATELET
Basophils Absolute: 0 10*3/uL (ref 0.0–0.2)
Basos: 0 %
EOS (ABSOLUTE): 0 10*3/uL (ref 0.0–0.4)
Eos: 1 %
Hematocrit: 39.5 % (ref 34.0–46.6)
Hemoglobin: 12.5 g/dL (ref 11.1–15.9)
Immature Grans (Abs): 0 10*3/uL (ref 0.0–0.1)
Immature Granulocytes: 0 %
Lymphocytes Absolute: 1.6 10*3/uL (ref 0.7–3.1)
Lymphs: 30 %
MCH: 21.6 pg — ABNORMAL LOW (ref 26.6–33.0)
MCHC: 31.6 g/dL (ref 31.5–35.7)
MCV: 68 fL — ABNORMAL LOW (ref 79–97)
Monocytes Absolute: 0.4 10*3/uL (ref 0.1–0.9)
Monocytes: 7 %
Neutrophils Absolute: 3.3 10*3/uL (ref 1.4–7.0)
Neutrophils: 62 %
Platelets: 236 10*3/uL (ref 150–450)
RBC: 5.79 x10E6/uL — ABNORMAL HIGH (ref 3.77–5.28)
RDW: 19 % — ABNORMAL HIGH (ref 11.7–15.4)
WBC: 5.3 10*3/uL (ref 3.4–10.8)

## 2022-03-31 LAB — VITAMIN D 25 HYDROXY (VIT D DEFICIENCY, FRACTURES): Vit D, 25-Hydroxy: 57.3 ng/mL (ref 30.0–100.0)

## 2022-03-31 LAB — HEMOGLOBIN A1C
Est. average glucose Bld gHb Est-mCnc: 117 mg/dL
Hgb A1c MFr Bld: 5.7 % — ABNORMAL HIGH (ref 4.8–5.6)

## 2022-03-31 LAB — INSULIN, RANDOM: INSULIN: 9.1 u[IU]/mL (ref 2.6–24.9)

## 2022-03-31 LAB — MAGNESIUM: Magnesium: 2.3 mg/dL (ref 1.6–2.3)

## 2022-03-31 NOTE — Progress Notes (Unsigned)
Chief Complaint:   OBESITY Molly Bean is here to discuss her progress with her obesity treatment plan along with follow-up of her obesity related diagnoses. Molly Bean is on the Category 1 Plan and states she is following her eating plan approximately 90% of the time. Molly Bean states she is walking and going to physical therapy 30-45 minutes 3-6 times per week.  Today's visit was #: 20 Starting weight: 240 lbs Starting date: 04/15/2020 Today's weight: 207 lbs Today's date: 03/10/2022 Total lbs lost to date: 33 lbs Total lbs lost since last in-office visit: 1 lb  Interim History: Meal plan going well.  She is doing P.T. once a week on Monday's.  She also does 45 minutes of daily meditation and does some weight lifting exercises.  She is still doing 30 min of walking or more daily.  Subjective:   1. Pre-diabetes Ageleta has good control of hunger and cravings.  She denies any concerns and tolerates metformin and Ozempic.  2. Vitamin D deficiency She is currently taking prescription vitamin D 50,000 IU twice weekly.  She denies nausea, vomiting or muscle weakness.  3. Other constipation Linzess seems to not be working as well.  It is now warmer outside and is exercising daily.  She is still only drinking 5 bottles of water per day.  4. At risk for dehydration Molly Bean is at risk for dehydration due to inadequate intake of water.    Assessment/Plan:   Orders Placed This Encounter  Procedures   CBC with Differential/Platelet   Comprehensive metabolic panel   Hemoglobin A1c   Insulin, random   Magnesium   VITAMIN D 25 Hydroxy (Vit-D Deficiency, Fractures)    Medications Discontinued During This Encounter  Medication Reason   Vitamin D, Ergocalciferol, (DRISDOL) 1.25 MG (50000 UNIT) CAPS capsule Reorder   linaclotide (LINZESS) 145 MCG CAPS capsule    Semaglutide, 1 MG/DOSE, 4 MG/3ML SOPN Reorder     Meds ordered this encounter  Medications   Semaglutide, 1 MG/DOSE, 4  MG/3ML SOPN    Sig: Inject 1 mg as directed once a week.    Dispense:  3 mL    Refill:  0    One mo supply; ov for rf   Vitamin D, Ergocalciferol, (DRISDOL) 1.25 MG (50000 UNIT) CAPS capsule    Sig: 1 po q wed, and 1 po q sun    Dispense:  8 capsule    Refill:  0    Ov for rf   linaclotide (LINZESS) 145 MCG CAPS capsule    Sig: 2 po qd    Dispense:  60 capsule    Refill:  0     1. Pre-diabetes Check labs today, see below.  Refill - Semaglutide, 1 MG/DOSE, 4 MG/3ML SOPN; Inject 1 mg as directed once a week.  Dispense: 3 mL; Refill: 0  - CBC with Differential/Platelet - Hemoglobin A1c - Insulin, random  2. Vitamin D deficiency Low Vitamin D level contributes to fatigue and are associated with obesity, breast, and colon cancer. She agrees to continue to take prescription Vitamin D '@50'$ ,000 IU every week and will follow-up for routine testing of Vitamin D, at least 2-3 times per year to avoid over-replacement. Check labs today, see below.  Refill- Vitamin D, Ergocalciferol, (DRISDOL) 1.25 MG (50000 UNIT) CAPS capsule; 1 po q wed, and 1 po q sun  Dispense: 8 capsule; Refill: 0  - VITAMIN D 25 Hydroxy (Vit-D Deficiency, Fractures)  3. Other constipation Increase water intake to 7  bottles per day, especially with your workouts. Double the dose of your Linzess.  Check labs today.  - linaclotide (LINZESS) 145 MCG CAPS capsule; 2 po qd  Dispense: 60 capsule; Refill: 0 - Comprehensive metabolic panel - Magnesium  4. At risk for dehydration Molly Bean was given approximately 9 minutes of dehydration prevention counseling today. Molly Bean is at risk for dehydration due to weight loss and current medication(s). She was encouraged to hydrate and monitor fluid status to avoid dehydration as weight loss plateaus.   5. Obesity with current BMI of 37.7 Increase water intake to 7 bottles per day.  Molly Bean is currently in the action stage of change. As such, her goal is to continue with weight  loss efforts. She has agreed to the Category 1 Plan.   Exercise goals:  As is.  Behavioral modification strategies: increasing water intake and increasing high fiber foods.  Molly Bean has agreed to follow-up with our clinic in 4 weeks. She was informed of the importance of frequent follow-up visits to maximize her success with intensive lifestyle modifications for her multiple health conditions.   Molly Bean was informed we would discuss her lab results at her next visit unless there is a critical issue that needs to be addressed sooner. Molly Bean agreed to keep her next visit at the agreed upon time to discuss these results.  Objective:   Blood pressure 108/73, pulse 67, temperature 98.1 F (36.7 C), height '5\' 2"'$  (1.575 m), weight 206 lb (93.4 kg), SpO2 99 %. Body mass index is 37.68 kg/m.  General: Cooperative, alert, well developed, in no acute distress. HEENT: Conjunctivae and lids unremarkable. Cardiovascular: Regular rhythm.  Lungs: Normal work of breathing. Neurologic: No focal deficits.   Lab Results  Component Value Date   CREATININE 0.80 03/30/2022   BUN 11 03/30/2022   NA 145 (H) 03/30/2022   K 4.3 03/30/2022   CL 105 03/30/2022   CO2 25 03/30/2022   Lab Results  Component Value Date   ALT 13 03/30/2022   AST 18 03/30/2022   ALKPHOS 74 03/30/2022   BILITOT 0.3 03/30/2022   Lab Results  Component Value Date   HGBA1C 5.7 (H) 03/30/2022   HGBA1C 5.7 (H) 11/03/2021   HGBA1C 5.9 (H) 07/01/2021   HGBA1C 6.0 (H) 03/23/2021   HGBA1C 6.2 (H) 12/30/2020   Lab Results  Component Value Date   INSULIN WILL FOLLOW 03/30/2022   INSULIN 22.5 11/03/2021   INSULIN 21.1 03/23/2021   INSULIN 21.9 04/15/2020   Lab Results  Component Value Date   TSH 1.100 04/15/2020   Lab Results  Component Value Date   CHOL 170 11/03/2021   HDL 62 11/03/2021   LDLCALC 97 11/03/2021   LDLDIRECT 95 12/30/2020   TRIG 58 11/03/2021   CHOLHDL 2.9 07/01/2021   Lab Results  Component  Value Date   VD25OH 57.3 03/30/2022   VD25OH 30.5 11/03/2021   VD25OH 47.7 05/20/2021   Lab Results  Component Value Date   WBC 5.3 03/30/2022   HGB 12.5 03/30/2022   HCT 39.5 03/30/2022   MCV 68 (L) 03/30/2022   PLT 236 03/30/2022   Lab Results  Component Value Date   IRON 63 11/03/2021   TIBC 332 11/03/2021   FERRITIN 72 11/03/2021    Attestation Statements:   Reviewed by clinician on day of visit: allergies, medications, problem list, medical history, surgical history, family history, social history, and previous encounter notes.  I, Davy Pique, RMA, am acting as Location manager for [employer], DO.  I have reviewed the above documentation for accuracy and completeness, and I agree with the above. Marjory Sneddon, D.O.  The [place] was signed into law in 2016 which includes the topic of electronic health records.  This provides immediate access to information in MyChart.  This includes consultation notes, operative notes, office notes, lab results and pathology reports.  If you have any questions about what you read please let us know at your next visit so we can discuss your concerns and take corrective action if need be.  We are right here with you.

## 2022-04-05 ENCOUNTER — Ambulatory Visit: Payer: BC Managed Care – PPO | Attending: Obstetrics and Gynecology | Admitting: Physical Therapy

## 2022-04-05 DIAGNOSIS — R278 Other lack of coordination: Secondary | ICD-10-CM | POA: Insufficient documentation

## 2022-04-05 DIAGNOSIS — N393 Stress incontinence (female) (male): Secondary | ICD-10-CM | POA: Insufficient documentation

## 2022-04-05 DIAGNOSIS — R2689 Other abnormalities of gait and mobility: Secondary | ICD-10-CM | POA: Insufficient documentation

## 2022-04-05 NOTE — Therapy (Signed)
OUTPATIENT PHYSICAL THERAPY TREATMENT NOTE   Patient Name: Molly Bean MRN: [id number] DOB:02-24-1961, 61 y.o., female Today's Date: 04/05/2022  REFERRING PROVIDER: Wannetta Sender   PT End of Session - 04/05/22 0907     Visit Number 29    Date for PT Re-Evaluation 03/50/09   PN & recert 3/81/82, 06/12/36   PT Start Time 0905    PT Stop Time 1000    PT Time Calculation (min) 55 min    Activity Tolerance Patient tolerated treatment well    Behavior During Therapy [facility] for tasks assessed/performed             Past Medical History:  Diagnosis Date   Anxiety    Constipation    DEPRESSION    edema lower extremeties    GERD    Joint pain    MENOPAUSAL SYNDROME    Multiple food allergies    OBESITY    Shortness of breath    Sleep apnea    wears CPAP   Past Surgical History:  Procedure Laterality Date   ABDOMINAL HYSTERECTOMY  11/1998   total fibroids   CESAREAN SECTION     Patient Active Problem List   Diagnosis Date Noted   At risk for dehydration 03/31/2022   Axillary hidradenitis suppurativa 07/07/2021   Proteus mirabilis infection 01/16/2021   Cutaneous abscess of right axilla 01/13/2021   Iron deficiency anemia 12/10/2020   At risk for side effect of medication 12/10/2020   Other constipation 08/19/2020   Prediabetes 08/06/2020   Chronic constipation 08/06/2020   Other hyperlipidemia 08/06/2020   Vitamin D deficiency 08/06/2020   Vaginitis 04/09/2020   Essential hypertension 05/01/2018   Asthma 03/14/2018   Hidradenitis suppurativa 01/18/2018   GAD (generalized anxiety disorder) 10/11/2016   MENOPAUSAL SYNDROME 04/23/2009   OBESITY 02/15/2008   Mood disorder ([place]) 04/12/2007   Gastroesophageal reflux disease 04/12/2007    REFERRING DIAG: R32 (ICD-10-CM) - Urinary incontinence, unspecified type  THERAPY DIAG:  Stress incontinence of urine   Other abnormalities of gait and mobility   Other lack of coordination    Rationale for Evaluation and  Treatment Rehabilitation  PERTINENT HISTORY: C-section, abdominal hysterectomy   PRECAUTIONS: N/A  SUBJECTIVE:   Pt reported she felt pelvic pain with sexual intercourse at the lower L vaginal area with penetration. Pt tried pelvcic tilts and still felt pain. Pt noticed more leakage the fourth day after intercourse when she was working in the yard with raking, pulling weeds, more bending, and planting flowers. Pt still notices leakage randomly but not as bad as it was by 60% improvement.   PAIN:  Are you having pain? No     TODAY'S TREATMENT:       [facility] PT Assessment -      Palpation   Palpation comment significant tightness along paraspinals to thoracic/ upper trap/levator R and at R tuberosity          Pelvic Floor Special Questions -      Pelvic Floor Internal Exam pt consented verbally and had no contraindications     Exam Type Vaginal     Palpation less tightness / tenderness at deepest layer  11-12 o'clock, 7-8 olcock by coccyx L, slightly lowered urethra / bladder on R > L at 3rd layer depth   (behind pubic symphysis)    Strength fair squeeze, definite lift  cued for lengthening, achieved 5 quick reps before fatigue             PATIENT EDUCATION: Education  details: see pt instructions, referral to sexual therapist, Showed pt anatomy images. Explained muscles attachments/ connection, physiology of deep core system/ spinal- thoracic-pelvis-lower kinetic chain as they relate to pt's presentation, Sx, and past Hx. Explained what and how these areas of deficits need to be restored to balance and function    Person educated: Patient Education method: Explanation, Demonstration, Tactile cues, Verbal cues, and Handouts Education comprehension: verbalized understanding, returned demonstration, verbal cues required, tactile cues required, and needs further education   HOME EXERCISE PROGRAM:                         See pt instruction section      PT Long Term  Goals -       PT LONG TERM GOAL #1   Title Pt will demo increased hip abduction strength from 3/5 to >4/5 in order to maintain walking routine and pelvic stability    Time 8    Period Weeks    Status Achieved    Target Date 02/03/22      PT LONG TERM GOAL #2   Title Pt will demo proper deepcore coordination and pelvic floor movement with no cues to progress to kegel exercises    Time 4    Period Weeks    Status Achieved    Target Date 02/03/22      PT LONG TERM GOAL #3   Title Pt will demo decreased abdominal scar restrictions to advance to deep core exercises and promote IAP for continence and bowel movement elimination with less dependence on Linzess    Time 4    Period Weeks    Status Achieved    Target Date 02/03/22      PT LONG TERM GOAL #4   Title Pt will increase FOTO score for constipation from 41 pts and Urinary 47 pts to > 55 pts in order to demo improved pelvic floor function  ( 06/15/21: no change urinary, 2 pt change improvement  with bowel function,  11/25/21: neg scores of change, need to readminster because pt has reported improvements  )    Time 10    Period Weeks    Status On-going    Target Date 04/14/22      PT LONG TERM GOAL #5   Title Pt will demo decreased pelvic floor mm tightness and proper relaxation of mm to have less pain with intercourcse and gynecological exams    Time 10    Period Weeks    Status Achieved    Target Date 02/03/22      PT LONG TERM GOAL #6   Title Pt will demo no pelvic floor tightness/ tendernessacorss 2 sessions, demo proper quick contractions > 5 reps and long holds 3sec, 5 reps without cues ino rder to gain continence    Time 6    Period Weeks    Status Partially Met    Target Date 03/17/22      PT LONG TERM GOAL #7   Title Pt will report dry pad with walking across 2 days in order to be continence with walking routine    Time 10    Period Weeks    Status On-going    Target Date 04/14/22              Plan -  04/05/22 1247     Clinical Impression Statement Pt required minimal manual Tx at pelvic floor and only needed facilitation of anterior wall to promote  a more upward movement of pelvic organs. As pt showed very minimal pelvic tightness and showed improved coordination, pt will be ready for kegel strengthening at upcoming sessions.    Pt reports still experiencing pain with sexual intercourse with husband. While pt has showed improvements from a musculoskeletal perspective, pt would benefit from a referral to  sexual therapist. Pt voiced interested and received info via email.   Pt continues to benefit from skilled PT.     Examination-Activity Limitations Toileting;Continence;Other    Stability/Clinical Decision Making Evolving/Moderate complexity    Rehab Potential Good    PT Frequency 1x / week    PT Duration Other (comment)   10   PT Treatment/Interventions Neuromuscular re-education;Cryotherapy;ADLs/Self Care Home Management;Therapeutic activities;Functional mobility training;Traction;Moist Heat;Stair training;Gait training;Patient/family education;Taping;Manual techniques;Scar mobilization;Balance training;Therapeutic exercise;Spinal Manipulations;Joint Manipulations;Energy conservation    Consulted and Agree with Plan of Care Patient               Jerl Mina, PT 04/05/2022, 12:55 PM

## 2022-04-05 NOTE — Patient Instructions (Signed)
   Reclined twist for hips and side of the hips/ legs/ spinal mm  Lay on your back, knees bend Scoot hips to the R , leave shoulders in place Drop knees to the L side to side

## 2022-04-21 ENCOUNTER — Ambulatory Visit: Payer: BC Managed Care – PPO | Admitting: Physical Therapy

## 2022-04-21 DIAGNOSIS — N393 Stress incontinence (female) (male): Secondary | ICD-10-CM

## 2022-04-21 DIAGNOSIS — R278 Other lack of coordination: Secondary | ICD-10-CM

## 2022-04-21 DIAGNOSIS — R2689 Other abnormalities of gait and mobility: Secondary | ICD-10-CM

## 2022-04-21 NOTE — Therapy (Signed)
OUTPATIENT PHYSICAL THERAPY TREATMENT NOTE from  02/03/22 to 04/21/22 across 10 visits , recertification   Patient Name: Molly Bean MRN: [id number] DOB:[birth year], 61 y.o., female Today's Date: 04/21/2022  REFERRING PROVIDER: Wannetta Sender   PT End of Session - 04/21/22 0859     Visit Number 30    Date for PT Re-Evaluation 56/15/37   PN & recert 9/43/27, 03/04/46, 04/21/22    PT Start Time 0857    PT Stop Time 0930    PT Time Calculation (min) 33 min    Activity Tolerance Patient tolerated treatment well    Behavior During Therapy [facility] for tasks assessed/performed             Past Medical History:  Diagnosis Date   Anxiety    Constipation    DEPRESSION    edema lower extremeties    GERD    Joint pain    MENOPAUSAL SYNDROME    Multiple food allergies    OBESITY    Shortness of breath    Sleep apnea    wears CPAP   Past Surgical History:  Procedure Laterality Date   ABDOMINAL HYSTERECTOMY  11/1998   total fibroids   CESAREAN SECTION     Patient Active Problem List   Diagnosis Date Noted   At risk for dehydration 03/31/2022   Axillary hidradenitis suppurativa 07/07/2021   Proteus mirabilis infection 01/16/2021   Cutaneous abscess of right axilla 01/13/2021   Iron deficiency anemia 12/10/2020   At risk for side effect of medication 12/10/2020   Other constipation 08/19/2020   Prediabetes 08/06/2020   Chronic constipation 08/06/2020   Other hyperlipidemia 08/06/2020   Vitamin D deficiency 08/06/2020   Vaginitis 04/09/2020   Essential hypertension 05/01/2018   Asthma 03/14/2018   Hidradenitis suppurativa 01/18/2018   GAD (generalized anxiety disorder) 10/11/2016   MENOPAUSAL SYNDROME 04/23/2009   OBESITY 02/15/2008   Mood disorder ([place]) 04/12/2007   Gastroesophageal reflux disease 04/12/2007    REFERRING DIAG: R32 (ICD-10-CM) - Urinary incontinence, unspecified type  THERAPY DIAG:  Stress incontinence of urine   Other abnormalities of gait and mobility    Other lack of coordination    Rationale for Evaluation and Treatment Rehabilitation  PERTINENT HISTORY: C-section, abdominal hysterectomy   PRECAUTIONS: N/A  SUBJECTIVE:   Pt reported she is feeling good. Pt still has leakage with walking    PAIN:  Are you having pain? No     TODAY'S TREATMENT:     Pelvic Floor Special Questions - 04/21/22 1630     Pelvic Floor Internal Exam pt consented verbally and had no contraindications    Exam Type Vaginal    Palpation no tightness / tenderness , pelvic organ in position    Strength fair squeeze, definite lift   achieved 3 sec holds, 3 reps but pt demo'd dyscoordination, ab overuse , downward movement of pelvic foor, Pt demo'd more upward sequential activation with exhalation when cued for toe abduction transverse arch, withheld long contractions             [facility] Adult PT Treatment/Exercise - 04/21/22 1632       Therapeutic Activites    Other Therapeutic Activities reassessed goals and FOTO      Neuro Re-ed    Neuro Re-ed Details  cue for coordination of pelvic floor with less ab overuse      Manual Therapy   Internal Pelvic Floor assesment of long contractions  PATIENT EDUCATION: Education details: withhold long contractions, focus on not letting abdominal mm overuse    Person educated: Patient Education method: Explanation, Demonstration, Tactile cues, Verbal cues, and Handouts Education comprehension: verbalized understanding, returned demonstration, verbal cues required, tactile cues required, and needs further education   HOME EXERCISE PROGRAM:                         Sequential pelvic floor movement , low belly sinks, top ab sinks sequence to minimize overuse of ab mm     PT Long Term Goals -       PT LONG TERM GOAL #1   Title Pt will demo increased hip abduction strength from 3/5 to >4/5 in order to maintain walking routine and pelvic stability    Time 8    Period  Weeks    Status Achieved    Target Date 02/03/22      PT LONG TERM GOAL #2   Title Pt will demo proper deepcore coordination and pelvic floor movement with no cues to progress to kegel exercises    Time 4    Period Weeks    Status Achieved    Target Date 02/03/22      PT LONG TERM GOAL #3   Title Pt will demo decreased abdominal scar restrictions to advance to deep core exercises and promote IAP for continence and bowel movement elimination with less dependence on Linzess    Time 4    Period Weeks    Status Achieved    Target Date 02/03/22      PT LONG TERM GOAL #4   Title Pt will increase FOTO score for constipation from 41 pts and Urinary 47 pts to > 55 pts in order to demo improved pelvic floor function  ( 06/15/21: no change urinary, 2 pt change improvement  with bowel function,  11/25/21: neg scores of change, need to readminster because pt has reported improvements, 04/21/22: constipation 6 pt change, urinary -2 pt change   )    Time 10    Period Weeks    Status On-going    Target Date 06/30/2022       PT LONG TERM GOAL #5   Title Pt will demo decreased pelvic floor mm tightness and proper relaxation of mm to have less pain with intercourcse and gynecological exams    Time 10    Period Weeks    Status Achieved    Target Date 02/03/22      PT LONG TERM GOAL #6   Title Pt will demo no pelvic floor tightness/ tendernessacorss 2 sessions, demo proper quick contractions > 5 reps and long holds 3sec, 5 reps without cues ino rder to gain continence    Time 6    Period Weeks    Status Partially Met    Target Date 06/30/2022     PT LONG TERM GOAL #7   Title Pt will report dry pad with walking across 2 days in order to be continence with walking routine    Time 10    Period Weeks    Status On-going    Target Date 06/30/2022               Plan -     Clinical Impression Statement Pt has achieved 4/7 goals and progressing well towards remaining goals. Pt showed  significantly decreased pelvic floor mm tightness and no more lowered pelvic organs. Pt showed proper lengthening of pelvic floor  but required more cues for proper technique for upward movement of pelvic floor mm without overuse of ab mm  Anticipate pt will achieve remaining goals with  continued  skilled PT.     Examination-Activity Limitations Toileting;Continence;Other    Stability/Clinical Decision Making Evolving/Moderate complexity    Rehab Potential Good    PT Frequency 1x / week    PT Duration Other (comment)   10   PT Treatment/Interventions Neuromuscular re-education;Cryotherapy;ADLs/Self Care Home Management;Therapeutic activities;Functional mobility training;Traction;Moist Heat;Stair training;Gait training;Patient/family education;Taping;Manual techniques;Scar mobilization;Balance training;Therapeutic exercise;Spinal Manipulations;Joint Manipulations;Energy conservation    Consulted and Agree with Plan of Care Patient               Jerl Mina, PT 04/21/2022, 4:41 PM

## 2022-04-28 ENCOUNTER — Ambulatory Visit: Payer: BC Managed Care – PPO | Admitting: Physical Therapy

## 2022-04-28 DIAGNOSIS — N393 Stress incontinence (female) (male): Secondary | ICD-10-CM

## 2022-04-28 DIAGNOSIS — R2689 Other abnormalities of gait and mobility: Secondary | ICD-10-CM

## 2022-04-28 DIAGNOSIS — R278 Other lack of coordination: Secondary | ICD-10-CM

## 2022-04-28 NOTE — Therapy (Signed)
OUTPATIENT PHYSICAL THERAPY TREATMENT NOTE    Patient Name: Molly Bean MRN: [id number] DOB:26-Mar-1961, 61 y.o., female Today's Date: 04/28/2022  REFERRING PROVIDER: Wannetta Sender   PT End of Session - 04/21/22 0859     Visit Number 31   Date for PT Re-Evaluation 16/07/37   PN & recert 10/09/24, 06/07/84, 04/21/22    PT Start Time 0857    PT Stop Time 0930    PT Time Calculation (min) 33 min    Activity Tolerance Patient tolerated treatment well    Behavior During Therapy [facility] for tasks assessed/performed             Past Medical History:  Diagnosis Date   Anxiety    Constipation    DEPRESSION    edema lower extremeties    GERD    Joint pain    MENOPAUSAL SYNDROME    Multiple food allergies    OBESITY    Shortness of breath    Sleep apnea    wears CPAP   Past Surgical History:  Procedure Laterality Date   ABDOMINAL HYSTERECTOMY  11/1998   total fibroids   CESAREAN SECTION     Patient Active Problem List   Diagnosis Date Noted   At risk for dehydration 03/31/2022   Axillary hidradenitis suppurativa 07/07/2021   Proteus mirabilis infection 01/16/2021   Cutaneous abscess of right axilla 01/13/2021   Iron deficiency anemia 12/10/2020   At risk for side effect of medication 12/10/2020   Other constipation 08/19/2020   Prediabetes 08/06/2020   Chronic constipation 08/06/2020   Other hyperlipidemia 08/06/2020   Vitamin D deficiency 08/06/2020   Vaginitis 04/09/2020   Essential hypertension 05/01/2018   Asthma 03/14/2018   Hidradenitis suppurativa 01/18/2018   GAD (generalized anxiety disorder) 10/11/2016   MENOPAUSAL SYNDROME 04/23/2009   OBESITY 02/15/2008   Mood disorder ([place]) 04/12/2007   Gastroesophageal reflux disease 04/12/2007    REFERRING DIAG: R32 (ICD-10-CM) - Urinary incontinence, unspecified type  THERAPY DIAG:  Stress incontinence of urine   Other abnormalities of gait and mobility   Other lack of coordination    Rationale for  Evaluation and Treatment Rehabilitation  PERTINENT HISTORY: C-section, abdominal hysterectomy   PRECAUTIONS: N/A  SUBJECTIVE:   Pt reported she had a massage for the first time and felt very relaxed  PAIN:  Are you having pain? No  TODAY'S TREATMENT:    [facility] PT Assessment - 04/28/22 1259       Coordination   Coordination and Movement Description poor coordination deep core      Ambulation/Gait   Gait Comments heel striking, supination, minimal hip and knee flexion B                [facility] Adult PT Treatment/Exercise - 04/28/22 1251       Therapeutic Activites    Other Therapeutic Activities active listening, reinforced relaxation strategies pt's experience with her first massage experience at a spa, gait mechanics,      Neuro Re-ed    Neuro Re-ed Details  cued for cued for less heel striking/ supination, more transverse arch activation, reassessed deep core coordination                [facility] PT Assessment - 04/28/22 1259       Coordination   Coordination and Movement Description poor coordination deep core      Ambulation/Gait   Gait Comments heel striking, supination, minimal hip and knee flexion B  PATIENT EDUCATION: Education details: withhold long contractions, focus on not letting abdominal mm overuse    Person educated: Patient Education method: Explanation, Demonstration, Tactile cues, Verbal cues, and Handouts Education comprehension: verbalized understanding, returned demonstration, verbal cues required, tactile cues required, and needs further education   HOME EXERCISE PROGRAM:                         Sequential pelvic floor movement , low belly sinks, top ab sinks sequence to minimize overuse of ab mm     PT Long Term Goals -       PT LONG TERM GOAL #1   Title Pt will demo increased hip abduction strength from 3/5 to >4/5 in order to maintain walking routine and pelvic stability    Time 8    Period Weeks     Status Achieved    Target Date 02/03/22      PT LONG TERM GOAL #2   Title Pt will demo proper deepcore coordination and pelvic floor movement with no cues to progress to kegel exercises    Time 4    Period Weeks    Status Achieved    Target Date 02/03/22      PT LONG TERM GOAL #3   Title Pt will demo decreased abdominal scar restrictions to advance to deep core exercises and promote IAP for continence and bowel movement elimination with less dependence on Linzess    Time 4    Period Weeks    Status Achieved    Target Date 02/03/22      PT LONG TERM GOAL #4   Title Pt will increase FOTO score for constipation from 41 pts and Urinary 47 pts to > 55 pts in order to demo improved pelvic floor function  ( 06/15/21: no change urinary, 2 pt change improvement  with bowel function,  11/25/21: neg scores of change, need to readminster because pt has reported improvements, 04/21/22: constipation 6 pt change, urinary -2 pt change   )    Time 10    Period Weeks    Status On-going    Target Date 06/30/2022       PT LONG TERM GOAL #5   Title Pt will demo decreased pelvic floor mm tightness and proper relaxation of mm to have less pain with intercourcse and gynecological exams    Time 10    Period Weeks    Status Achieved    Target Date 02/03/22      PT LONG TERM GOAL #6   Title Pt will demo no pelvic floor tightness/ tendernessacorss 2 sessions, demo proper quick contractions > 5 reps and long holds 3sec, 5 reps without cues ino rder to gain continence    Time 6    Period Weeks    Status Partially Met    Target Date 06/30/2022     PT LONG TERM GOAL #7   Title Pt will report dry pad with walking across 2 days in order to be continence with walking routine    Time 10    Period Weeks    Status On-going    Target Date 06/30/2022               Plan -     Clinical Impression Statement Pt demo'd proper coordination with deep core with less ab overuse,   Pt required cues for gait  mechanics to minimize supination and promote more hip flexion. Knee flexion.   Provided  active listening to pt's relaxation and self care practices to de-stress and provided encouragement.  Plan to assess pelvic floor at next session to progress to kegel training   Anticipate pt will achieve remaining goals with  continued  skilled PT.     Examination-Activity Limitations Toileting;Continence;Other    Stability/Clinical Decision Making Evolving/Moderate complexity    Rehab Potential Good    PT Frequency 1x / week    PT Duration Other (comment)   10   PT Treatment/Interventions Neuromuscular re-education;Cryotherapy;ADLs/Self Care Home Management;Therapeutic activities;Functional mobility training;Traction;Moist Heat;Stair training;Gait training;Patient/family education;Taping;Manual techniques;Scar mobilization;Balance training;Therapeutic exercise;Spinal Manipulations;Joint Manipulations;Energy conservation    Consulted and Agree with Plan of Care Patient               Jerl Mina, PT 04/28/2022, 1:01 PM

## 2022-04-29 ENCOUNTER — Encounter (INDEPENDENT_AMBULATORY_CARE_PROVIDER_SITE_OTHER): Payer: Self-pay | Admitting: Family Medicine

## 2022-04-29 ENCOUNTER — Ambulatory Visit (INDEPENDENT_AMBULATORY_CARE_PROVIDER_SITE_OTHER): Payer: BC Managed Care – PPO | Admitting: Family Medicine

## 2022-04-29 VITALS — BP 117/57 | HR 76 | Temp 98.0°F | Ht 62.0 in | Wt 206.0 lb

## 2022-04-29 DIAGNOSIS — E559 Vitamin D deficiency, unspecified: Secondary | ICD-10-CM

## 2022-04-29 DIAGNOSIS — R7303 Prediabetes: Secondary | ICD-10-CM | POA: Diagnosis not present

## 2022-04-29 DIAGNOSIS — E669 Obesity, unspecified: Secondary | ICD-10-CM

## 2022-04-29 DIAGNOSIS — K5909 Other constipation: Secondary | ICD-10-CM | POA: Diagnosis not present

## 2022-04-29 DIAGNOSIS — Z6837 Body mass index (BMI) 37.0-37.9, adult: Secondary | ICD-10-CM

## 2022-04-29 MED ORDER — LINACLOTIDE 145 MCG PO CAPS: ORAL_CAPSULE | ORAL | 0 refills | Status: DC

## 2022-04-29 MED ORDER — METFORMIN HCL 500 MG PO TABS: 500.0000 mg | ORAL_TABLET | Freq: Two times a day (BID) | ORAL | 0 refills | Status: DC

## 2022-04-29 MED ORDER — SEMAGLUTIDE (1 MG/DOSE) 4 MG/3ML ~~LOC~~ SOPN
1.0000 mg | PEN_INJECTOR | SUBCUTANEOUS | 0 refills | Status: DC
Start: 2022-04-29 — End: 2022-06-17

## 2022-04-29 MED ORDER — VITAMIN D (ERGOCALCIFEROL) 1.25 MG (50000 UNIT) PO CAPS
ORAL_CAPSULE | ORAL | 0 refills | Status: DC
Start: 2022-04-29 — End: 2022-06-17

## 2022-05-03 ENCOUNTER — Ambulatory Visit: Payer: BC Managed Care – PPO | Admitting: Physical Therapy

## 2022-05-04 NOTE — Progress Notes (Signed)
Chief Complaint:   OBESITY Molly Bean is here to discuss her progress with her obesity treatment plan along with follow-up of her obesity related diagnoses. Molly Bean is on the Category 1 Plan and states she is following her eating plan approximately 90% of the time. Molly Bean states she is walking and weight training 30-60 minutes 5-6 times per week.  Today's visit was #: 31 Starting weight: 240 lbs Starting date: 04/15/2020 Today's weight: 206 lbs Today's date: 04/29/2022 Total lbs lost to date: 34 Total lbs lost since last in-office visit: 1  Interim History: Pt has increased to drinking 6 bottles of water a day and has been exercising more. She is doing great! Pt is going to [place] to her sister for pt's birthday weekend.  Review labs today.  Subjective:   1. Pre-diabetes Discussed labs with patient today- CBC, CMP, magnesium. Molly Bean has a diagnosis of prediabetes based on her elevated HgA1c and was informed this puts her at greater risk of developing diabetes. She continues to work on diet and exercise to decrease her risk of diabetes. She denies nausea or hypoglycemia.  2. Other constipation Discussed labs with patient today- CMP. Not at goal of 100 oz of water a day, but pt has been doing great with exercise and hasn't increased intake. She reports being more constipated recently. Elevated sodium level on labs.  3. Vitamin D deficiency Discussed labs with patient today. She is currently taking prescription vitamin D 50,000 IU twice a week. She denies nausea, vomiting or muscle weakness.  Assessment/Plan:  No orders of the defined types were placed in this encounter.   Medications Discontinued During This Encounter  Medication Reason   metFORMIN (GLUCOPHAGE) 500 MG tablet Reorder   Semaglutide, 1 MG/DOSE, 4 MG/3ML SOPN Reorder   Vitamin D, Ergocalciferol, (DRISDOL) 1.25 MG (50000 UNIT) CAPS capsule Reorder   linaclotide (LINZESS) 145 MCG CAPS capsule Reorder      Meds ordered this encounter  Medications   linaclotide (LINZESS) 145 MCG CAPS capsule    Sig: 2 po qd    Dispense:  60 capsule    Refill:  0   metFORMIN (GLUCOPHAGE) 500 MG tablet    Sig: Take 1 tablet (500 mg total) by mouth 2 (two) times daily with a meal.    Dispense:  60 tablet    Refill:  0    Ov for rf   Semaglutide, 1 MG/DOSE, 4 MG/3ML SOPN    Sig: Inject 1 mg as directed once a week.    Dispense:  3 mL    Refill:  0    One mo supply; ov for rf   Vitamin D, Ergocalciferol, (DRISDOL) 1.25 MG (50000 UNIT) CAPS capsule    Sig: 1 po q wed, and 1 po q sun    Dispense:  8 capsule    Refill:  0    Ov for rf     1. Pre-diabetes A1c is still great at 5.7. Molly Bean will continue to work on weight loss, exercise, and decreasing simple carbohydrates to help decrease the risk of diabetes.   Refill- metFORMIN (GLUCOPHAGE) 500 MG tablet; Take 1 tablet (500 mg total) by mouth 2 (two) times daily with a meal.  Dispense: 60 tablet; Refill: 0 Refill- Semaglutide, 1 MG/DOSE, 4 MG/3ML SOPN; Inject 1 mg as directed once a week.  Dispense: 3 mL; Refill: 0  2. Other constipation Molly Bean was informed that a decrease in bowel movement frequency is normal while losing weight, but stools should  not be hard or painful. Orders and follow up as documented in patient record.   Counseling Getting to Good Bowel Health: Your goal is to have one soft bowel movement each day. Drink at least 8 glasses of water each day. Eat plenty of fiber (goal is over 25 grams each day). It is best to get most of your fiber from dietary sources which includes leafy green vegetables, fresh fruit, and whole grains. You may need to add fiber with the help of OTC fiber supplements. These include Metamucil, Citrucel, and Flaxseed. If you are still having trouble, try adding Miralax or Magnesium Citrate. If all of these changes do not work, Cabin crew.  Refill- linaclotide (LINZESS) 145 MCG CAPS capsule; 2 po qd   Dispense: 60 capsule; Refill: 0  3. Vitamin D deficiency Low Vitamin D level contributes to fatigue and are associated with obesity, breast, and colon cancer. She agrees to continue to take prescription Vitamin D '@50'$ ,000 IU twice a week and will follow-up for routine testing of Vitamin D, at least 2-3 times per year to avoid over-replacement.  Refill- Vitamin D, Ergocalciferol, (DRISDOL) 1.25 MG (50000 UNIT) CAPS capsule; 1 po q wed, and 1 po q sun  Dispense: 8 capsule; Refill: 0  4. Obesity with current BMI of 37.7 Molly Bean is currently in the action stage of change. As such, her goal is to continue with weight loss efforts. She has agreed to the Category 1 Plan.   Measure vegetables and fruits and increase water intake to a goal of 8 bottles a day.  Exercise goals:  As is  Behavioral modification strategies: decreasing simple carbohydrates and increasing water intake.  Molly Bean has agreed to follow-up with our clinic in 4 weeks. She was informed of the importance of frequent follow-up visits to maximize her success with intensive lifestyle modifications for her multiple health conditions.   Objective:   Blood pressure (!) 117/57, pulse 76, temperature 98 F (36.7 C), height '5\' 2"'$  (1.575 m), weight 206 lb (93.4 kg), SpO2 98 %. Body mass index is 37.68 kg/m.  General: Cooperative, alert, well developed, in no acute distress. HEENT: Conjunctivae and lids unremarkable. Cardiovascular: Regular rhythm.  Lungs: Normal work of breathing. Neurologic: No focal deficits.   Lab Results  Component Value Date   CREATININE 0.80 03/30/2022   BUN 11 03/30/2022   NA 145 (H) 03/30/2022   K 4.3 03/30/2022   CL 105 03/30/2022   CO2 25 03/30/2022   Lab Results  Component Value Date   ALT 13 03/30/2022   AST 18 03/30/2022   ALKPHOS 74 03/30/2022   BILITOT 0.3 03/30/2022   Lab Results  Component Value Date   HGBA1C 5.7 (H) 03/30/2022   HGBA1C 5.7 (H) 11/03/2021   HGBA1C 5.9 (H)  07/01/2021   HGBA1C 6.0 (H) 03/23/2021   HGBA1C 6.2 (H) 12/30/2020   Lab Results  Component Value Date   INSULIN 9.1 03/30/2022   INSULIN 22.5 11/03/2021   INSULIN 21.1 03/23/2021   INSULIN 21.9 04/15/2020   Lab Results  Component Value Date   TSH 1.100 04/15/2020   Lab Results  Component Value Date   CHOL 170 11/03/2021   HDL 62 11/03/2021   LDLCALC 97 11/03/2021   LDLDIRECT 95 12/30/2020   TRIG 58 11/03/2021   CHOLHDL 2.9 07/01/2021   Lab Results  Component Value Date   VD25OH 57.3 03/30/2022   VD25OH 30.5 11/03/2021   VD25OH 47.7 05/20/2021   Lab Results  Component Value Date  WBC 5.3 03/30/2022   HGB 12.5 03/30/2022   HCT 39.5 03/30/2022   MCV 68 (L) 03/30/2022   PLT 236 03/30/2022   Lab Results  Component Value Date   IRON 63 11/03/2021   TIBC 332 11/03/2021   FERRITIN 72 11/03/2021    Attestation Statements:   Reviewed by clinician on day of visit: allergies, medications, problem list, medical history, surgical history, family history, social history, and previous encounter notes.  Time spent on visit including pre-visit chart review and post-visit care and charting was 30 minutes.   I, Kathlene November, BS, CMA, am acting as transcriptionist for [employer], DO.   I have reviewed the above documentation for accuracy and completeness, and I agree with the above. Marjory Sneddon, D.O.  The [place] was signed into law in 2016 which includes the topic of electronic health records.  This provides immediate access to information in MyChart.  This includes consultation notes, operative notes, office notes, lab results and pathology reports.  If you have any questions about what you read please let us know at your next visit so we can discuss your concerns and take corrective action if need be.  We are right here with you.

## 2022-05-05 ENCOUNTER — Encounter: Admitting: Physical Therapy

## 2022-05-12 ENCOUNTER — Ambulatory Visit: Payer: BC Managed Care – PPO | Admitting: Physical Therapy

## 2022-05-12 ENCOUNTER — Encounter (INDEPENDENT_AMBULATORY_CARE_PROVIDER_SITE_OTHER): Payer: Self-pay

## 2022-05-14 ENCOUNTER — Encounter: Admitting: Physical Therapy

## 2022-05-18 ENCOUNTER — Ambulatory Visit (INDEPENDENT_AMBULATORY_CARE_PROVIDER_SITE_OTHER): Payer: BC Managed Care – PPO | Admitting: Obstetrics and Gynecology

## 2022-05-18 ENCOUNTER — Encounter: Payer: Self-pay | Admitting: Obstetrics and Gynecology

## 2022-05-18 VITALS — BP 117/69 | HR 75

## 2022-05-18 DIAGNOSIS — N3946 Mixed incontinence: Secondary | ICD-10-CM | POA: Diagnosis not present

## 2022-05-18 DIAGNOSIS — M62838 Other muscle spasm: Secondary | ICD-10-CM | POA: Diagnosis not present

## 2022-05-18 MED ORDER — DIAZEPAM 5 MG PO TABS: ORAL_TABLET | ORAL | 0 refills | Status: AC

## 2022-05-18 NOTE — Patient Instructions (Signed)
I will prescribe valium 5 mg pills to place vaginally up to 2 times a day for vaginal muscle spasms. Start at night and take one every night for the next several weeks to see if it improves your symptoms. Once you are improving you can taper off the medication and just use as needed. If the medication makes you drowsy then only use at bedtime and/or we can reduce the dose. Do not use gel to place it, as that will prevent the tablet from dissolving.  Instead place 1-2 drops of water on the table before inserting it in the vagina. If the pills do not dissolve well we can switch to a special compounded suppository. Let me know how you are doing on the medication and if you have any questions.

## 2022-05-18 NOTE — Progress Notes (Signed)
 Urogynecology Return Visit  SUBJECTIVE  History of Present Illness: Jisele Price is a 61 y.o. female seen in follow-up for mixed incontinence and levator spasm.    She feels that physical therapy is helping. She has been doing her exercises every day. The leakage is still there but she is not always aware when it is happening. Denies leakage with cough/ sneeze. When she is walking in the morning and evening she has more leakage. Also noticed with chores around the house.   Has tried different dilators and still having pain with intercourse. She tried the emla cream and slippery stuff lubricant, which helped some but still painful.   She has been on ozempic and has been more regular with her bowel movements.   Past Medical History: Patient  has a past medical history of Anxiety, Constipation, DEPRESSION, edema lower extremeties, GERD, Joint pain, MENOPAUSAL SYNDROME, Multiple food allergies, OBESITY, Shortness of breath, and Sleep apnea.   Past Surgical History: She  has a past surgical history that includes Cesarean section and Abdominal hysterectomy (11/1998).   Medications: She has a current medication list which includes the following prescription(s): acetaminophen, albuterol, bupropion, diazepam, estradiol, iron, lidocaine-prilocaine, linaclotide, metformin, mirabegron er, rosuvastatin, semaglutide (1 mg/dose), triamterene-hydrochlorothiazide, and vitamin d (ergocalciferol).   Allergies: Patient is allergic to shellfish allergy, pennsaid [diclofenac sodium], and oxycodone.   Social History: Patient  reports that she quit smoking about 44 years ago. Her smoking use included cigarettes. She has a 6.00 pack-year smoking history. She quit smokeless tobacco use about 34 years ago. She reports that she does not drink alcohol and does not use drugs.      OBJECTIVE     Physical Exam: Vitals:   05/18/22 0759  BP: 117/69  Pulse: 75    Gen: No apparent distress, A&O x  3.  Detailed Urogynecologic Evaluation:  Deferred.    ASSESSMENT AND PLAN    Ms. Borchard is a 61 y.o. with:  1. Levator spasm   2. Mixed incontinence      Vaginal atrophy - Continue estrace cream 0.5g twice a week  2. Dyspareunia/ levator spasm - Continue with PT for pelvic floor muscle tension - Discussed option of vaginal muscle relaxant vs pelvic floor muscle trigger point injections. She would like to try the muscle relaxant. Previously used flexeril but did not see much improvement. Prescribed vaginal valium '5mg'$  nightly for two weeks then as needed after. Advised to take at night to ensure it does not cause too much sedation.   3. Mixed incontinence - Continue PT - Did not respond to OAB medications - Recommended urodynamic testing as she is mostly unaware of leakage, however worse with activity so may be more related to stress incontinence.  - Discussed options for SUI that are non surgical including pessary or urethral bulking. Reviewed third line therapies for OAB. It would be ok to try a pessary without urodynamic testing and she is considering this.   Follow up 2 months or sooner as needed  Jaquita Folds, MD

## 2022-05-19 ENCOUNTER — Ambulatory Visit: Payer: BC Managed Care – PPO | Attending: Obstetrics and Gynecology | Admitting: Physical Therapy

## 2022-05-19 DIAGNOSIS — R2689 Other abnormalities of gait and mobility: Secondary | ICD-10-CM | POA: Diagnosis present

## 2022-05-19 DIAGNOSIS — N393 Stress incontinence (female) (male): Secondary | ICD-10-CM | POA: Diagnosis present

## 2022-05-19 DIAGNOSIS — M533 Sacrococcygeal disorders, not elsewhere classified: Secondary | ICD-10-CM | POA: Diagnosis not present

## 2022-05-19 DIAGNOSIS — R278 Other lack of coordination: Secondary | ICD-10-CM | POA: Diagnosis present

## 2022-05-19 NOTE — Patient Instructions (Addendum)
PELVIC FLOOR / KEGEL EXERCISES   Pelvic floor/ Kegel exercises are used to strengthen the muscles in the base of your pelvis that are responsible for supporting your pelvic organs and preventing urine/feces leakage. Based on your therapist's recommendations, they can be performed while standing, sitting, or lying down. Imagine pelvic floor area as a diamond with pelvic landmarks: top =pubic bone, bottom tip=tailbone, sides=sitting bones (ischial tuberosities).    Make yourself aware of this muscle group by using these cues while coordinating your breath: Inhale, feel pelvic floor diamond area lower like hammock towards your feet and ribcage/belly expanding. Pause. Let the exhale naturally and feel your belly sink, abdominal muscles hugging in around you and you may notice the pelvic diamond draws upward towards your head forming a umbrella shape. Give a squeeze during the exhalation like you are stopping the flow of urine. If you are squeezing the buttock muscles, try to give 50% less effort.   Common Errors: Breath holding: If you are holding your breath, you may be bearing down against your bladder instead of pulling it up. If you belly bulges up while you are squeezing, you are holding your breath. Be sure to breathe gently in and out while exercising. Counting out loud may help you avoid holding your breath. Accessory muscle use: You should not see or feel other muscle movement when performing pelvic floor exercises. When done properly, no one can tell that you are performing the exercises. Keep the buttocks, belly and inner thighs relaxed. Overdoing it: Your muscles can fatigue and stop working for you if you over-exercise. You may actually leak more or feel soreness at the lower abdomen or rectum.  YOUR HOME EXERCISE PROGRAM    SHORT HOLDS: Position: on back,Inhale and then exhale. Then squeeze the muscle.  (Be sure to let belly sink in with exhales and not push outward) Perform 10  repetitions, 4 different  Times/day  ( Insert one set before deep core level 1, anotehr set another deep core level 2) --repeat at night ( total of 4  of 10 reps sets)                       DECREASE DOWNWARD PRESSURE ON  YOUR PELVIC FLOOR, ABDOMINAL, LOW BACK MUSCLES       PRESERVE YOUR PELVIC HEALTH LONG-TERM   ** SQUEEZE pelvic floor BEFORE YOUR SNEEZE, COUGH, LAUGH   ** EXHALE BEFORE YOU RISE AGAINST GRAVITY (lifting, sit to stand, from squat to stand)   ** LOG ROLL OUT OF BED INSTEAD OF CRUNCH/SIT-UP

## 2022-05-19 NOTE — Therapy (Signed)
OUTPATIENT PHYSICAL THERAPY TREATMENT NOTE    Patient Name: Molly Bean MRN: [id number] DOB:[birth year], 61 y.o., female Today's Date: 05/19/2022  REFERRING PROVIDER: Wannetta Sender   PT End of Session - 05/19/22 0805     Visit Number 32    Date for PT Re-Evaluation 96/43/83   PN & recert 05/22/39, 12/08/52   PT Start Time 0800    PT Stop Time 0845    PT Time Calculation (min) 45 min    Activity Tolerance Patient tolerated treatment well    Behavior During Therapy [facility] for tasks assessed/performed             Past Medical History:  Diagnosis Date   Anxiety    Constipation    DEPRESSION    edema lower extremeties    GERD    Joint pain    MENOPAUSAL SYNDROME    Multiple food allergies    OBESITY    Shortness of breath    Sleep apnea    wears CPAP   Past Surgical History:  Procedure Laterality Date   ABDOMINAL HYSTERECTOMY  11/1998   total fibroids   CESAREAN SECTION     Patient Active Problem List   Diagnosis Date Noted   At risk for dehydration 03/31/2022   Axillary hidradenitis suppurativa 07/07/2021   Proteus mirabilis infection 01/16/2021   Cutaneous abscess of right axilla 01/13/2021   Iron deficiency anemia 12/10/2020   At risk for side effect of medication 12/10/2020   Other constipation 08/19/2020   Prediabetes 08/06/2020   Chronic constipation 08/06/2020   Other hyperlipidemia 08/06/2020   Vitamin D deficiency 08/06/2020   Vaginitis 04/09/2020   Essential hypertension 05/01/2018   Asthma 03/14/2018   Hidradenitis suppurativa 01/18/2018   GAD (generalized anxiety disorder) 10/11/2016   MENOPAUSAL SYNDROME 04/23/2009   OBESITY 02/15/2008   Mood disorder ([place]) 04/12/2007   Gastroesophageal reflux disease 04/12/2007    REFERRING DIAG: R32 (ICD-10-CM) - Urinary incontinence, unspecified type  THERAPY DIAG:  Stress incontinence of urine   Other abnormalities of gait and mobility   Other lack of coordination    Rationale for Evaluation and  Treatment Rehabilitation  PERTINENT HISTORY: C-section, abdominal hysterectomy   PRECAUTIONS: N/A  SUBJECTIVE:  Pt drove to [facility] wearing a pad. Pt had leakage but it was manageable. Pt does not know when she is leaking. Bending , leaning laundry, vaccuuming causes leakage. Pt saw her urogynecologist and pt declined the treatment options but will try a new medication for incontinence.   PAIN:  Are you having pain? No   [facility] PT Assessment - 05/19/22 0853       Observation/Other Assessments   Observations wearing flip flops             Pelvic Floor Special Questions - 05/19/22 0851     Pelvic Floor Internal Exam pt consented verbally and had no contraindications    Exam Type Vaginal    Palpation no tightness / tenderness , pelvic organ in position    Strength fair squeeze, definite lift   achieved 10 reps, MMT 3/5            [facility] Adult PT Treatment/Exercise - 05/19/22 0851       Therapeutic Activites    Other Therapeutic Activities explained  the importance of avoiding flip flop and rationale for pelvic floor function,  type of pelvic floor contractions , plan to progress to long holds next session      Neuro Re-ed    Neuro Re-ed Details  cued for toe abduction/ arch lifted,  quick pelvic floor relaxation and holds, 10 reps, reinforced not to overdo the quanity                    PATIENT EDUCATION: Education details: withhold long contractions, focus on not letting abdominal mm overuse    Person educated: Patient Education method: Explanation, Demonstration, Tactile cues, Verbal cues, and Handouts Education comprehension: verbalized understanding, returned demonstration, verbal cues required, tactile cues required, and needs further education   HOME EXERCISE PROGRAM:                         Sequential pelvic floor movement , low belly sinks, top ab sinks sequence to minimize overuse of ab mm     PT Long Term Goals -       PT LONG TERM GOAL #1    Title Pt will demo increased hip abduction strength from 3/5 to >4/5 in order to maintain walking routine and pelvic stability    Time 8    Period Weeks    Status Achieved    Target Date 02/03/22      PT LONG TERM GOAL #2   Title Pt will demo proper deepcore coordination and pelvic floor movement with no cues to progress to kegel exercises    Time 4    Period Weeks    Status Achieved    Target Date 02/03/22      PT LONG TERM GOAL #3   Title Pt will demo decreased abdominal scar restrictions to advance to deep core exercises and promote IAP for continence and bowel movement elimination with less dependence on Linzess    Time 4    Period Weeks    Status Achieved    Target Date 02/03/22      PT LONG TERM GOAL #4   Title Pt will increase FOTO score for constipation from 41 pts and Urinary 47 pts to > 55 pts in order to demo improved pelvic floor function  ( 06/15/21: no change urinary, 2 pt change improvement  with bowel function,  11/25/21: neg scores of change, need to read[place] because pt has reported improvements, 04/21/22: constipation 6 pt change, urinary -2 pt change   )    Time 10    Period Weeks    Status On-going    Target Date 06/30/2022       PT LONG TERM GOAL #5   Title Pt will demo decreased pelvic floor mm tightness and proper relaxation of mm to have less pain with intercourcse and gynecological exams    Time 10    Period Weeks    Status Achieved    Target Date 02/03/22      PT LONG TERM GOAL #6   Title Pt will demo no pelvic floor tightness/ tendernessacorss 2 sessions, demo proper quick contractions > 5 reps and long holds 3sec, 5 reps without cues ino rder to gain continence    Time 6    Period Weeks    Status Partially Met    Target Date 06/30/2022     PT LONG TERM GOAL #7   Title Pt will report dry pad with walking across 2 days in order to be continence with walking routine    Time 10    Period Weeks    Status On-going    Target Date 06/30/2022                Plan -  Clinical Impression Statement Pt demo'd proper coordination with short contractions of pelvic floor. Pt achieved 10 reps . Pt required cues for toe abduction/ extension to minimize pelvic floor overactivity. Plan to add feet arch strengthening as pt enjoys walking and lower kinetic chain strengthening will be helpful to minimize overactivity of pelvic floor. Educated to avoid flip flop and advised sandals with heel strap to help with minimizing feet hypomobility which affects pelvic floor.    Plan to assess pelvic floor at next session to progress to kegel training with long holds.    Anticipate pt will achieve remaining goals with  continued  skilled PT.     Examination-Activity Limitations Toileting;Continence;Other    Stability/Clinical Decision Making Evolving/Moderate complexity    Rehab Potential Good    PT Frequency 1x / week    PT Duration Other (comment)   10   PT Treatment/Interventions Neuromuscular re-education;Cryotherapy;ADLs/Self Care Home Management;Therapeutic activities;Functional mobility training;Traction;Moist Heat;Stair training;Gait training;Patient/family education;Taping;Manual techniques;Scar mobilization;Balance training;Therapeutic exercise;Spinal Manipulations;Joint Manipulations;Energy conservation    Consulted and Agree with Plan of Care Patient               Jerl Mina, PT 05/19/2022, 8:06 AM

## 2022-05-20 ENCOUNTER — Other Ambulatory Visit: Payer: Self-pay

## 2022-05-20 ENCOUNTER — Emergency Department (HOSPITAL_BASED_OUTPATIENT_CLINIC_OR_DEPARTMENT_OTHER): Payer: BC Managed Care – PPO

## 2022-05-20 ENCOUNTER — Emergency Department (HOSPITAL_BASED_OUTPATIENT_CLINIC_OR_DEPARTMENT_OTHER)
Admission: EM | Admit: 2022-05-20 | Discharge: 2022-05-20 | Disposition: A | Discharge status: Home / Self Care | Payer: BC Managed Care – PPO | Attending: Emergency Medicine | Admitting: Emergency Medicine

## 2022-05-20 ENCOUNTER — Encounter (HOSPITAL_BASED_OUTPATIENT_CLINIC_OR_DEPARTMENT_OTHER): Payer: Self-pay | Admitting: Emergency Medicine

## 2022-05-20 DIAGNOSIS — M79605 Pain in left leg: Secondary | ICD-10-CM | POA: Diagnosis present

## 2022-05-20 DIAGNOSIS — M25552 Pain in left hip: Secondary | ICD-10-CM | POA: Diagnosis not present

## 2022-05-20 DIAGNOSIS — W19XXXA Unspecified fall, initial encounter: Secondary | ICD-10-CM | POA: Diagnosis not present

## 2022-05-20 NOTE — Discharge Instructions (Addendum)
The x-rays of your hip along with pelvis did not show any acute findings on today's visit.  You may benefit from some anti-inflammatories such as aleve to help with your pain.

## 2022-05-20 NOTE — ED Provider Notes (Signed)
[facility]ERGENCY DEPARTMENT Provider Note   CSN: [id number] Arrival date & time: 05/20/22  2536     History GERD, Anxiety Chief Complaint  Patient presents with   Fall    Molly Bean is a 61 y.o. female.  61 year old female with a past medical history of GERD, Anxiety presents to the ED with a chief complaint of mechanical fall.  Patient patient is at her daughter's house yesterday when she went to try to walk out of the bathroom and slipped causing her legs to go into a Split.  She reports taking most of the impact on the left side felt that her leg went backwards.  There is pain along the posterior aspect of her left leg exacerbated with any type of palpation or ambulation.  She has tried Epsom salt bath with no improvement in her symptoms.  She did not strike her head, there was no loss of consciousness.  No other injuries to her arms or legs.No blood thinners.   The history is provided by the patient and medical records.  Fall Pertinent negatives include no chest pain, no abdominal pain, no headaches and no shortness of breath.       Home Medications Prior to Admission medications   Medication Sig Start Date End Date Taking? Authorizing Provider  acetaminophen (TYLENOL) 500 MG tablet Take 1,000 mg by mouth every 6 (six) hours as needed for mild pain or headache.    [provider]  albuterol (VENTOLIN HFA) 108 (90 Base) MCG/ACT inhaler TAKE 2 PUFFS BY MOUTH EVERY 6 HOURS AS NEEDED FOR WHEEZE OR SHORTNESS OF BREATH 12/07/21   Opalski, Neoma Laming, DO  buPROPion (WELLBUTRIN SR) 150 MG 12 hr tablet Take 1 tablet (150 mg total) by mouth in the morning. 01/04/22   Opalski, Neoma Laming, DO  diazepam (VALIUM) 5 MG tablet Place 1 tablet vaginally nightly as needed for muscle spasm/ pelvic pain. 05/18/22   Jaquita Folds, MD  estradiol (ESTRACE) 0.1 MG/GM vaginal cream Place 0.5g nightly for two weeks then twice a week after 01/13/22   Jaquita Folds, MD   Ferrous Sulfate (IRON) 325 (65 Fe) MG TABS Take 1 tablet by mouth daily at 2 PM.    [provider]  lidocaine-prilocaine (EMLA) cream Apply 1 application. topically as needed. 01/13/22   Jaquita Folds, MD  linaclotide [facility]) 145 MCG CAPS capsule 2 po qd 04/29/22   Mellody Dance, DO  metFORMIN (GLUCOPHAGE) 500 MG tablet Take 1 tablet (500 mg total) by mouth 2 (two) times daily with a meal. 04/29/22   Opalski, Neoma Laming, DO  mirabegron ER (MYRBETRIQ) 25 MG TB24 tablet Take 1 tablet (25 mg total) by mouth daily. 09/02/21   Jaquita Folds, MD  rosuvastatin (CRESTOR) 10 MG tablet TAKE 1 TABLET BY MOUTH EVERYDAY AT BEDTIME 01/04/22   Opalski, Deborah, DO  Semaglutide, 1 MG/DOSE, 4 MG/3ML SOPN Inject 1 mg as directed once a week. 04/29/22   Opalski, Neoma Laming, DO  triamterene-hydrochlorothiazide (MAXZIDE-25) 37.5-25 MG tablet Take 1 tablet by mouth daily. 09/04/21   Elouise Munroe, MD  Vitamin D, Ergocalciferol, (DRISDOL) 1.25 MG (50000 UNIT) CAPS capsule 1 po q wed, and 1 po q sun 04/29/22   Opalski, Neoma Laming, DO      Allergies    Shellfish allergy, Pennsaid [diclofenac sodium], and Oxycodone    Review of Systems   Review of Systems  Constitutional:  Negative for fever.  Respiratory:  Negative for shortness of breath.   Cardiovascular:  Negative  for chest pain.  Gastrointestinal:  Negative for abdominal pain.  Musculoskeletal:  Positive for arthralgias, back pain and myalgias.  Neurological:  Negative for light-headedness and headaches.    Physical Exam Updated Vital Signs BP (!) 143/73 (BP Location: Right Arm)   Pulse 82   Temp 98.8 F (37.1 C) (Oral)   Resp 18   Ht '5\' 2"'$  (1.575 m)   Wt 93.9 kg   SpO2 100%   BMI 37.86 kg/m  Physical Exam Vitals and nursing note reviewed.  Constitutional:      Appearance: Normal appearance.  HENT:     Head: Normocephalic and atraumatic.     Mouth/Throat:     Mouth: Mucous membranes are moist.  Cardiovascular:     Rate  and Rhythm: Normal rate.  Pulmonary:     Effort: Pulmonary effort is normal.  Abdominal:     General: Abdomen is flat.  Musculoskeletal:     Cervical back: Normal range of motion and neck supple.     Right hip: No tenderness or bony tenderness. Normal range of motion.     Left hip: Tenderness present. Normal range of motion.       Legs:     Comments: To palpation along the left hip, palpable pain down the left upper leg.Pulses present, sensation intact throughout. No bruising or hematoma noted.   Skin:    General: Skin is warm and dry.  Neurological:     Mental Status: She is alert and oriented to person, place, and time.     ED Results / Procedures / Treatments   Labs (all labs ordered are listed, but only abnormal results are displayed) Labs Reviewed - No data to display  EKG None  Radiology DG FEMUR MIN 2 VIEWS LEFT  Result Date: 05/20/2022 CLINICAL DATA:  Severe left hip and buttock pain following an injury yesterday. EXAM: LEFT FEMUR 2 VIEWS COMPARISON:  Pelvis radiographs obtained at the same time. FINDINGS: There is no evidence of fracture or other focal bone lesions. Soft tissues are unremarkable. Posterior femoral linear lucency is compatible with a normal nutrient channel. IMPRESSION: Negative. Electronically Signed   By: Claudie Revering M.D.   On: 05/20/2022 11:10   DG Pelvis 1-2 Views  Result Date: 05/20/2022 CLINICAL DATA:  Trauma, pain EXAM: PELVIS - 1-2 VIEW COMPARISON:  None Available. FINDINGS: No displaced fracture or dislocation is seen. Phleboliths are seen in pelvis. IMPRESSION: No fracture or dislocation is seen in pelvis. Electronically Signed   By: Elmer Picker M.D.   On: 05/20/2022 11:09    Procedures Procedures    Medications Ordered in ED Medications - No data to display  ED Course/ Medical Decision Making/ A&P                           Medical Decision Making Amount and/or Complexity of Data Reviewed Radiology: ordered.   Patient  presents to the ED status post mechanical fall yesterday after splinting and her legs while walking out of the bathroom at her daughter's house.  Pain along the left hip and radiating down the left leg.  She is ambulatory in the ED.  No loss of conscious, currently on no blood thinners and did not strike her head.  During evaluation there is palpable pain along the left hip in the distribution down the sciatic nerve.  There is bruising or hematoma noted at this time.  She is neurovascularly intact to her left leg and  right leg.  We discussed plain films to evaluate for any fractures versus contusion.   X-ray of the left hip along with pelvis without any acute fracture noted.  I discussed these results with patient, she was provided with a copy of her x-ray for her records.  I did discuss anti-inflammatories at home such as Aleve to help with pain control.  She is otherwise in stable condition.  Patient hemodynamically stable for discharge.   Portions of this note were generated with [employer]. Dictation errors may occur despite best attempts at proofreading Final Clinical Impression(s) / ED Diagnoses Final diagnoses:  Fall, initial encounter  Left leg pain    Rx / DC Orders ED Discharge Orders     None         Janeece Fitting, Hershal Coria 05/20/22 1122    Fransico Meadow, MD 05/22/22 1217

## 2022-05-20 NOTE — ED Notes (Signed)
Patient transported to X-ray 

## 2022-05-20 NOTE — ED Triage Notes (Signed)
States she slipped on a clothes hanger yesterday and did a 'half split" and has been having pain to left thigh and buttocks. No LOC

## 2022-05-25 ENCOUNTER — Other Ambulatory Visit (INDEPENDENT_AMBULATORY_CARE_PROVIDER_SITE_OTHER): Payer: Self-pay | Admitting: Family Medicine

## 2022-05-25 DIAGNOSIS — R7303 Prediabetes: Secondary | ICD-10-CM

## 2022-05-28 ENCOUNTER — Ambulatory Visit: Payer: BC Managed Care – PPO | Admitting: Physical Therapy

## 2022-05-28 DIAGNOSIS — R278 Other lack of coordination: Secondary | ICD-10-CM

## 2022-05-28 DIAGNOSIS — R2689 Other abnormalities of gait and mobility: Secondary | ICD-10-CM

## 2022-05-28 DIAGNOSIS — N393 Stress incontinence (female) (male): Secondary | ICD-10-CM

## 2022-05-28 NOTE — Therapy (Signed)
OUTPATIENT PHYSICAL THERAPY TREATMENT NOTE    Patient Name: Molly Bean MRN: [id number] DOB:[birth year], 61 y.o., female Today's Date: 05/28/2022  REFERRING PROVIDER: Wannetta Sender   PT End of Session - 05/28/22 0832     Visit Number 33    Date for PT Re-Evaluation 24/58/09   PN & recert 9/83/38, 11/09/03   PT Start Time 0803    PT Stop Time 0845    PT Time Calculation (min) 42 min    Activity Tolerance Patient tolerated treatment well    Behavior During Therapy [facility] for tasks assessed/performed             Past Medical History:  Diagnosis Date   Anxiety    Constipation    DEPRESSION    edema lower extremeties    GERD    Joint pain    MENOPAUSAL SYNDROME    Multiple food allergies    OBESITY    Shortness of breath    Sleep apnea    wears CPAP   Past Surgical History:  Procedure Laterality Date   ABDOMINAL HYSTERECTOMY  11/1998   total fibroids   CESAREAN SECTION     Patient Active Problem List   Diagnosis Date Noted   At risk for dehydration 03/31/2022   Axillary hidradenitis suppurativa 07/07/2021   Proteus mirabilis infection 01/16/2021   Cutaneous abscess of right axilla 01/13/2021   Iron deficiency anemia 12/10/2020   At risk for side effect of medication 12/10/2020   Other constipation 08/19/2020   Prediabetes 08/06/2020   Chronic constipation 08/06/2020   Other hyperlipidemia 08/06/2020   Vitamin D deficiency 08/06/2020   Vaginitis 04/09/2020   Essential hypertension 05/01/2018   Asthma 03/14/2018   Hidradenitis suppurativa 01/18/2018   GAD (generalized anxiety disorder) 10/11/2016   MENOPAUSAL SYNDROME 04/23/2009   OBESITY 02/15/2008   Mood disorder ([place]) 04/12/2007   Gastroesophageal reflux disease 04/12/2007    REFERRING DIAG: R32 (ICD-10-CM) - Urinary incontinence, unspecified type  THERAPY DIAG:  Stress incontinence of urine   Other abnormalities of gait and mobility   Other lack of coordination    Rationale for Evaluation and  Treatment Rehabilitation  PERTINENT HISTORY: C-section, abdominal hysterectomy   PRECAUTIONS: N/A  SUBJECTIVE:  Pt is noticing her pad is drier but she still is not aware when she is leaking. Pt reported she had a fall last week without injury. Reported imaging indicated no Fx, bruise.    PAIN:  Are you having pain? No   [facility] PT Assessment - 05/28/22 0832       Coordination   Coordination and Movement Description poor diassocaition between trunk and pelvis/LE in multidifis training      Palpation   SI assessment  tenderness/ tightness at L SIJ , glut attchments along SIJ             Pelvic Floor Special Questions - 05/28/22 3976     Pelvic Floor Internal Exam pt consented verbally and had no contraindications    Exam Type Vaginal    Palpation tightness / tenderness ,only ay 5 o'clock position at deepest layers ( pt reported she had a fall last week ithout injury. Reported imaging indicated no Fx, bruise)    Strength fair squeeze, definite lift   achieved 4 reps, MMT 3/5            [facility] Adult PT Treatment/Exercise - 05/28/22 7341       Neuro Re-ed    Neuro Re-ed Details  cued for multidifis HEP  Exercises   Exercises --   see pt instructions     Moist Heat Therapy   Number Minutes Moist Heat 5 Minutes    Moist Heat Location --   sacrum, in lower trunk rotation to R for SIJ mobility ( unbilled)     Manual Therapy   Internal Pelvic Floor STM/MWM to decrease tightness/ tendenress at area noted in assessment to promote lengthening of posterior pelvic floor  and L SIJ mobility                    PATIENT EDUCATION: Education details: withhold long contractions, focus on not letting abdominal mm overuse    Person educated: Patient Education method: Explanation, Demonstration, Tactile cues, Verbal cues, and Handouts Education comprehension: verbalized understanding, returned demonstration, verbal cues required, tactile cues required, and needs  further education   HOME EXERCISE PROGRAM:                         Sequential pelvic floor movement , low belly sinks, top ab sinks sequence to minimize overuse of ab mm     PT Long Term Goals -       PT LONG TERM GOAL #1   Title Pt will demo increased hip abduction strength from 3/5 to >4/5 in order to maintain walking routine and pelvic stability    Time 8    Period Weeks    Status Achieved    Target Date 02/03/22      PT LONG TERM GOAL #2   Title Pt will demo proper deepcore coordination and pelvic floor movement with no cues to progress to kegel exercises    Time 4    Period Weeks    Status Achieved    Target Date 02/03/22      PT LONG TERM GOAL #3   Title Pt will demo decreased abdominal scar restrictions to advance to deep core exercises and promote IAP for continence and bowel movement elimination with less dependence on Linzess    Time 4    Period Weeks    Status Achieved    Target Date 02/03/22      PT LONG TERM GOAL #4   Title Pt will increase FOTO score for constipation from 41 pts and Urinary 47 pts to > 55 pts in order to demo improved pelvic floor function  ( 06/15/21: no change urinary, 2 pt change improvement  with bowel function,  11/25/21: neg scores of change, need to readminster because pt has reported improvements, 04/21/22: constipation 6 pt change, urinary -2 pt change   )    Time 10    Period Weeks    Status On-going    Target Date 06/30/2022       PT LONG TERM GOAL #5   Title Pt will demo decreased pelvic floor mm tightness and proper relaxation of mm to have less pain with intercourcse and gynecological exams    Time 10    Period Weeks    Status Achieved    Target Date 02/03/22      PT LONG TERM GOAL #6   Title Pt will demo no pelvic floor tightness/ tendernessacorss 2 sessions, demo proper quick contractions > 5 reps and long holds 3sec, 5 reps without cues ino rder to gain continence    Time 6    Period Weeks    Status Partially Met     Target Date 06/30/2022     PT LONG TERM GOAL #  7   Title Pt will report dry pad with walking across 2 days in order to be continence with walking routine    Time 10    Period Weeks    Status On-going    Target Date 06/30/2022               Plan -     Clinical Impression Statement Pt reports her urinary pad staying drier but she is still not sensing when she is leaking.   Pt demo'd proper coordination with short contractions of pelvic floor. Pt achieved 4 reps before fatigue today. Pt showed tenderness and tightness at L pelvic floor which was related to her fall last week. Pt reported reported imaging indicated no Fx, bruise. This indicates pt's body has strength and stability with quick recovery.  Manual Tx helped to address these issues.   Added multidifis strengthening seated but required cues for proper technique to minimize upper trap overuse and coordination. Anticipate this strengthening exercise will help with pelcic stability and minimize relapse of LBP.   Anticipate pt will achieve remaining goals with  continued  skilled PT.     Examination-Activity Limitations Toileting;Continence;Other    Stability/Clinical Decision Making Evolving/Moderate complexity    Rehab Potential Good    PT Frequency 1x / week    PT Duration Other (comment)   10   PT Treatment/Interventions Neuromuscular re-education;Cryotherapy;ADLs/Self Care Home Management;Therapeutic activities;Functional mobility training;Traction;Moist Heat;Stair training;Gait training;Patient/family education;Taping;Manual techniques;Scar mobilization;Balance training;Therapeutic exercise;Spinal Manipulations;Joint Manipulations;Energy conservation    Consulted and Agree with Plan of Care Patient               Jerl Mina, PT 05/28/2022, 8:45 AM

## 2022-05-28 NOTE — Patient Instructions (Addendum)
Multifidis twist   Band is on doorknob: sit facing perpendicular to door , sit halfway towards front of chair, firm through 4 points of contact at buttocks and feet. Feet are placed hip with apart.   Twisting trunk without moving the hips and knees Hold band at the level of ribcage, elbows bent,shoulder blades roll back and down like squeezing a pencil under armpit   Exhale twist,.10-15 deg away from door without moving your hips/ knees, press more weight on the side of the sitting bones/ foot opp of your direction of turn as your counterweight. Continue to maintain equal weight through legs.  Keep knee unlocked.  30 reps   __  Morning:  Pelvic quick contractions 4 reps  Deep core level 1 Deep core level 2  Pelvic quick contractions 4 reps   Evening:  Pelvic quick contractions 4 reps  Deep core level 1 Deep core level 2  Pelvic quick contractions 4 reps

## 2022-05-31 ENCOUNTER — Ambulatory Visit (INDEPENDENT_AMBULATORY_CARE_PROVIDER_SITE_OTHER): Payer: BC Managed Care – PPO | Admitting: Family Medicine

## 2022-06-03 ENCOUNTER — Ambulatory Visit: Payer: BC Managed Care – PPO | Admitting: Physical Therapy

## 2022-06-03 DIAGNOSIS — N393 Stress incontinence (female) (male): Secondary | ICD-10-CM | POA: Diagnosis not present

## 2022-06-03 DIAGNOSIS — R278 Other lack of coordination: Secondary | ICD-10-CM

## 2022-06-03 DIAGNOSIS — R2689 Other abnormalities of gait and mobility: Secondary | ICD-10-CM

## 2022-06-03 NOTE — Therapy (Signed)
OUTPATIENT PHYSICAL THERAPY TREATMENT NOTE    Patient Name: Molly Bean MRN: [id number] DOB:31-Mar-1961, 61 y.o., female Today's Date: 06/03/2022  REFERRING PROVIDER: Wannetta Sender   PT End of Session - 06/03/22 1034     Visit Number 34    Date for PT Re-Evaluation 16/55/37   PN & recert 4/82/70, 04/10/66   PT Start Time 0805    PT Stop Time 0845    PT Time Calculation (min) 40 min    Activity Tolerance Patient tolerated treatment well    Behavior During Therapy [facility] for tasks assessed/performed             Past Medical History:  Diagnosis Date   Anxiety    Constipation    DEPRESSION    edema lower extremeties    GERD    Joint pain    MENOPAUSAL SYNDROME    Multiple food allergies    OBESITY    Shortness of breath    Sleep apnea    wears CPAP   Past Surgical History:  Procedure Laterality Date   ABDOMINAL HYSTERECTOMY  11/1998   total fibroids   CESAREAN SECTION     Patient Active Problem List   Diagnosis Date Noted   At risk for dehydration 03/31/2022   Axillary hidradenitis suppurativa 07/07/2021   Proteus mirabilis infection 01/16/2021   Cutaneous abscess of right axilla 01/13/2021   Iron deficiency anemia 12/10/2020   At risk for side effect of medication 12/10/2020   Other constipation 08/19/2020   Prediabetes 08/06/2020   Chronic constipation 08/06/2020   Other hyperlipidemia 08/06/2020   Vitamin D deficiency 08/06/2020   Vaginitis 04/09/2020   Essential hypertension 05/01/2018   Asthma 03/14/2018   Hidradenitis suppurativa 01/18/2018   GAD (generalized anxiety disorder) 10/11/2016   MENOPAUSAL SYNDROME 04/23/2009   OBESITY 02/15/2008   Mood disorder ([place]) 04/12/2007   Gastroesophageal reflux disease 04/12/2007    REFERRING DIAG: R32 (ICD-10-CM) - Urinary incontinence, unspecified type  THERAPY DIAG:  Stress incontinence of urine   Other abnormalities of gait and mobility   Other lack of coordination    Rationale for Evaluation and  Treatment Rehabilitation  PERTINENT HISTORY: C-section, abdominal hysterectomy   PRECAUTIONS: N/A  SUBJECTIVE:  Pt wanted to review the multidifis exercise    PAIN:  Are you having pain? No   [facility] PT Assessment - 06/03/22 1034       Coordination   Coordination and Movement Description slight upper trap overuse with multidifis HEP      Posture/Postural Control   Posture Comments hyperextension of knees, in standing             Pelvic Floor Special Questions - 06/03/22 1035     External Palpation through clothing: 3 sec, 2 reps , seated             [facility] Adult PT Treatment/Exercise - 06/03/22 1034       Neuro Re-ed    Neuro Re-ed Details  cued for multifidis technique with less upper trap, cued for long pelvic floor contractions,                    PATIENT EDUCATION: Education details: withhold long contractions, focus on not letting abdominal mm overuse    Person educated: Patient Education method: Explanation, Demonstration, Tactile cues, Verbal cues, and Handouts Education comprehension: verbalized understanding, returned demonstration, verbal cues required, tactile cues required, and needs further education   HOME EXERCISE PROGRAM:  Sequential pelvic floor movement , low belly sinks, top ab sinks sequence to minimize overuse of ab mm     PT Long Term Goals -       PT LONG TERM GOAL #1   Title Pt will demo increased hip abduction strength from 3/5 to >4/5 in order to maintain walking routine and pelvic stability    Time 8    Period Weeks    Status Achieved    Target Date 02/03/22      PT LONG TERM GOAL #2   Title Pt will demo proper deepcore coordination and pelvic floor movement with no cues to progress to kegel exercises    Time 4    Period Weeks    Status Achieved    Target Date 02/03/22      PT LONG TERM GOAL #3   Title Pt will demo decreased abdominal scar restrictions to advance to deep core exercises  and promote IAP for continence and bowel movement elimination with less dependence on Linzess    Time 4    Period Weeks    Status Achieved    Target Date 02/03/22      PT LONG TERM GOAL #4   Title Pt will increase FOTO score for constipation from 41 pts and Urinary 47 pts to > 55 pts in order to demo improved pelvic floor function  ( 06/15/21: no change urinary, 2 pt change improvement  with bowel function,  11/25/21: neg scores of change, need to readminster because pt has reported improvements, 04/21/22: constipation 6 pt change, urinary -2 pt change   )    Time 10    Period Weeks    Status On-going    Target Date 06/30/2022       PT LONG TERM GOAL #5   Title Pt will demo decreased pelvic floor mm tightness and proper relaxation of mm to have less pain with intercourcse and gynecological exams    Time 10    Period Weeks    Status Achieved    Target Date 02/03/22      PT LONG TERM GOAL #6   Title Pt will demo no pelvic floor tightness/ tendernessacorss 2 sessions, demo proper quick contractions > 5 reps and long holds 3sec, 5 reps without cues ino rder to gain continence    Time 6    Period Weeks    Status Partially Met    Target Date 06/30/2022     PT LONG TERM GOAL #7   Title Pt will report dry pad with walking across 2 days in order to be continence with walking routine    Time 10    Period Weeks    Status On-going    Target Date 06/30/2022               Plan -     Clinical Impression Statement   Pt demo'd proper coordination with short contractions of pelvic floor in next progression with seated position.  Progressed pt to endurance contractions which pt demo'd correctly with cues.   Required cues for multidifis strengthening in seated exercise to minimize upper trap overuse and coordination.  Plan to add [facility] deep core exercises.   Anticipate pt will achieve remaining goals with  continued  skilled PT to minimize leakage.     Examination-Activity Limitations  Toileting;Continence;Other    Stability/Clinical Decision Making Evolving/Moderate complexity    Rehab Potential Good    PT Frequency 1x / week    PT Duration Other (  comment)   10   PT Treatment/Interventions Neuromuscular re-education;Cryotherapy;ADLs/Self Care Home Management;Therapeutic activities;Functional mobility training;Traction;Moist Heat;Stair training;Gait training;Patient/family education;Taping;Manual techniques;Scar mobilization;Balance training;Therapeutic exercise;Spinal Manipulations;Joint Manipulations;Energy conservation    Consulted and Agree with Plan of Care Patient               Jerl Mina, PT 06/03/2022, 11:37 AM

## 2022-06-03 NOTE — Patient Instructions (Signed)
PELVIC FLOOR / KEGEL EXERCISES   Pelvic floor/ Kegel exercises are used to strengthen the muscles in the base of your pelvis that are responsible for supporting your pelvic organs and preventing urine/feces leakage. Based on your therapist's recommendations, they can be performed while standing, sitting, or lying down. Imagine pelvic floor area as a diamond with pelvic landmarks: top =pubic bone, bottom tip=tailbone, sides=sitting bones (ischial tuberosities).    Make yourself aware of this muscle group by using these cues while coordinating your breath: Inhale, feel pelvic floor diamond area lower like hammock towards your feet and ribcage/belly expanding. Pause. Let the exhale naturally and feel your belly sink, abdominal muscles hugging in around you and you may notice the pelvic diamond draws upward towards your head forming a umbrella shape. Give a squeeze during the exhalation like you are stopping the flow of urine. If you are squeezing the buttock muscles, try to give 50% less effort.   Common Errors: Breath holding: If you are holding your breath, you may be bearing down against your bladder instead of pulling it up. If you belly bulges up while you are squeezing, you are holding your breath. Be sure to breathe gently in and out while exercising. Counting out loud may help you avoid holding your breath. Accessory muscle use: You should not see or feel other muscle movement when performing pelvic floor exercises. When done properly, no one can tell that you are performing the exercises. Keep the buttocks, belly and inner thighs relaxed. Overdoing it: Your muscles can fatigue and stop working for you if you over-exercise. You may actually leak more or feel soreness at the lower abdomen or rectum.  YOUR HOME EXERCISE PROGRAM  LONG HOLDS:   Position: on back   Inhale and then exhale. Then squeeze the muscle and count aloud for 3 seconds. Rest with three long breaths. (Be sure to let belly  sink in with exhales and not push outward) Perform 2 repetitions,  before and after deep core level 1 and 2 ( am and pm) (total 4 sets per day at different times)   SHORT HOLDS: Position: on sitting  Inhale and then exhale. Then squeeze the muscle.  (Be sure to let belly sink in with exhales and not push outward) Perform 4 repetitions, 3 different  Times/day at meals                       DECREASE DOWNWARD PRESSURE ON  YOUR PELVIC FLOOR, ABDOMINAL, LOW BACK MUSCLES       PRESERVE YOUR PELVIC HEALTH LONG-TERM   ** SQUEEZE pelvic floor BEFORE YOUR SNEEZE, COUGH, LAUGH   ** EXHALE BEFORE YOU RISE AGAINST GRAVITY (lifting, sit to stand, from squat to stand)   ** LOG ROLL OUT OF BED INSTEAD OF CRUNCH/SIT-UP

## 2022-06-14 ENCOUNTER — Encounter: Admitting: Physical Therapy

## 2022-06-17 ENCOUNTER — Ambulatory Visit (INDEPENDENT_AMBULATORY_CARE_PROVIDER_SITE_OTHER): Payer: BC Managed Care – PPO | Admitting: Family Medicine

## 2022-06-17 ENCOUNTER — Encounter (INDEPENDENT_AMBULATORY_CARE_PROVIDER_SITE_OTHER): Payer: Self-pay | Admitting: Family Medicine

## 2022-06-17 VITALS — BP 96/6 | HR 76 | Temp 98.2°F | Ht 62.0 in | Wt 208.0 lb

## 2022-06-17 DIAGNOSIS — R7303 Prediabetes: Secondary | ICD-10-CM

## 2022-06-17 DIAGNOSIS — Z6838 Body mass index (BMI) 38.0-38.9, adult: Secondary | ICD-10-CM

## 2022-06-17 DIAGNOSIS — E669 Obesity, unspecified: Secondary | ICD-10-CM

## 2022-06-17 DIAGNOSIS — E559 Vitamin D deficiency, unspecified: Secondary | ICD-10-CM | POA: Diagnosis not present

## 2022-06-17 DIAGNOSIS — K5909 Other constipation: Secondary | ICD-10-CM

## 2022-06-17 MED ORDER — SEMAGLUTIDE (1 MG/DOSE) 4 MG/3ML ~~LOC~~ SOPN: 1.0000 mg | PEN_INJECTOR | SUBCUTANEOUS | 0 refills | Status: DC

## 2022-06-17 MED ORDER — LINACLOTIDE 145 MCG PO CAPS: ORAL_CAPSULE | ORAL | 0 refills | Status: DC

## 2022-06-17 MED ORDER — VITAMIN D (ERGOCALCIFEROL) 1.25 MG (50000 UNIT) PO CAPS: ORAL_CAPSULE | ORAL | 0 refills | Status: DC

## 2022-06-17 MED ORDER — METFORMIN HCL 500 MG PO TABS: 500.0000 mg | ORAL_TABLET | Freq: Two times a day (BID) | ORAL | 0 refills | Status: DC

## 2022-06-19 NOTE — Progress Notes (Signed)
Chief Complaint:   OBESITY Molly Bean is here to discuss her progress with her obesity treatment plan along with follow-up of her obesity related diagnoses. Molly Bean is on the Category 1 Plan and states she is following her eating plan approximately 90% of the time. Molly Bean states she is walking and exercising for 60 minutes 6 times per week.  Today's visit was #: 29 Starting weight: 240 lbs Starting date: 04/14/2020 Today's weight: 208 lbs Today's date: 06/17/2022 Total lbs lost to date: 32 lbs Total lbs lost since last in-office visit: 0 lbs  Interim History:  Molly Bean is here for a follow up office visit.  We reviewed her meal plan and all questions were answered.  Patient's food recall appears to be accurate and consistent with what is on plan when she is following it.   When eating on plan, her hunger and cravings are well controlled.  Molly Bean gained 3 lbs of muscle and lost 0.6 lbs of fat mass.  Subjective:   1. Vitamin D deficiency She is currently taking prescription vitamin D 50,000 IU each week. She denies nausea, vomiting or muscle weakness.  Lab Results  Component Value Date   VD25OH 57.3 03/30/2022   VD25OH 30.5 11/03/2021   VD25OH 47.7 05/20/2021   2. Pre-diabetes Molly Bean is tolerating medication(s) well without side effects.  Medication compliance is good as patient endorses taking it as prescribed.  The patient denies additional concerns regarding this condition.    She is taking her Metformin at breakfast and lunch daily. Molly Bean reports great control of hunger and cravings.  3. Other constipation Molly Bean has increased her water intake.  Assessment/Plan:  No orders of the defined types were placed in this encounter.   Medications Discontinued During This Encounter  Medication Reason   linaclotide (LINZESS) 145 MCG CAPS capsule Reorder   metFORMIN (GLUCOPHAGE) 500 MG tablet Reorder   Semaglutide, 1 MG/DOSE, 4 MG/3ML SOPN Reorder    Vitamin D, Ergocalciferol, (DRISDOL) 1.25 MG (50000 UNIT) CAPS capsule Reorder     Meds ordered this encounter  Medications   Vitamin D, Ergocalciferol, (DRISDOL) 1.25 MG (50000 UNIT) CAPS capsule    Sig: 1 po q wed, and 1 po q sun    Dispense:  8 capsule    Refill:  0    Ov for rf   Semaglutide, 1 MG/DOSE, 4 MG/3ML SOPN    Sig: Inject 1 mg as directed once a week.    Dispense:  3 mL    Refill:  0    One mo supply; ov for rf   linaclotide (LINZESS) 145 MCG CAPS capsule    Sig: 2 po qd    Dispense:  60 capsule    Refill:  0   metFORMIN (GLUCOPHAGE) 500 MG tablet    Sig: Take 1 tablet (500 mg total) by mouth 2 (two) times daily with a meal.    Dispense:  60 tablet    Refill:  0    Ov for rf     1. Vitamin D deficiency Will refill Vitamin D as follows: - Vitamin D, Ergocalciferol, (DRISDOL) 1.25 MG (50000 UNIT) CAPS capsule; 1 po q wed, and 1 po q sun  Dispense: 8 capsule; Refill: 0  2. Pre-diabetes This is a new diagnosis for Molly Bean - I counseled patient on pathophysiology of the disease process of I.R.  and Pre-DM.   - Stressed importance of dietary and lifestyle modifications to result in weight loss as first line  txmnt - In addition, we discussed the risks and benefits of various medication options which can help [place] in the management of this disease process as well as with weight loss.  Will consider starting one of these meds in future, or a dose adjustment in the future, as we will focus on prudent nutritional plan at this time.  - Continue to decrease simple carbs; increase fiber and proteins -> follow her meal plan  - Handouts provided at pt's request after education provided.  All concerns/questions addressed.   - Anticipatory guidance given.   - Savvy will continue to work on weight loss, exercise, and decreasing simple carbohydrates to help decrease the risk of diabetes.  - We will recheck A1c and fasting insulin level in approximately 3 months from last  check, or as deemed appropriate.  Will refill Metformin and Semaglutide as follows:   - Semaglutide, 1 MG/DOSE, 4 MG/3ML SOPN; Inject 1 mg as directed once a week.  Dispense: 3 mL; Refill: 0 - metFORMIN (GLUCOPHAGE) 500 MG tablet; Take 1 tablet (500 mg total) by mouth 2 (two) times daily with a meal.  Dispense: 60 tablet; Refill: 0  3. Other constipation Molly Bean was informed that a decrease in bowel movement frequency is normal while losing weight, but stools should not be hard or painful. Orders and follow up as documented in patient record. Molly Bean will change from bread to wraps and increase fiber naturally. Meal plan was reviewed, as well as ways to increase fiber with Molly Bean. Will refill Linzess as follows: - linaclotide (LINZESS) 145 MCG CAPS capsule; 2 po qd  Dispense: 60 capsule; Refill: 0  4. Obesity with current BMI of 38. Molly Bean is currently in the action stage of change. As such, her goal is to continue with weight loss efforts. She has agreed to the Category 1 Plan.   Exercise goals: As is.  Behavioral modification strategies: increasing lean protein intake, decreasing simple carbohydrates, and increasing water intake.  Molly Bean has agreed to follow-up with our clinic in 3-4 weeks. She was informed of the importance of frequent follow-up visits to maximize her success with intensive lifestyle modifications for her multiple health conditions.   Objective:   Blood pressure (!) 96/6, pulse 76, temperature 98.2 F (36.8 C), height '5\' 2"'$  (1.575 m), weight 208 lb (94.3 kg), SpO2 97 %. Body mass index is 38.04 kg/m.  General: Cooperative, alert, well developed, in no acute distress. HEENT: Conjunctivae and lids unremarkable. Cardiovascular: Regular rhythm.  Lungs: Normal work of breathing. Neurologic: No focal deficits.   Lab Results  Component Value Date   CREATININE 0.80 03/30/2022   BUN 11 03/30/2022   NA 145 (H) 03/30/2022   K 4.3 03/30/2022   CL 105 03/30/2022    CO2 25 03/30/2022   Lab Results  Component Value Date   ALT 13 03/30/2022   AST 18 03/30/2022   ALKPHOS 74 03/30/2022   BILITOT 0.3 03/30/2022   Lab Results  Component Value Date   HGBA1C 5.7 (H) 03/30/2022   HGBA1C 5.7 (H) 11/03/2021   HGBA1C 5.9 (H) 07/01/2021   HGBA1C 6.0 (H) 03/23/2021   HGBA1C 6.2 (H) 12/30/2020   Lab Results  Component Value Date   INSULIN 9.1 03/30/2022   INSULIN 22.5 11/03/2021   INSULIN 21.1 03/23/2021   INSULIN 21.9 04/15/2020   Lab Results  Component Value Date   TSH 1.100 04/15/2020   Lab Results  Component Value Date   CHOL 170 11/03/2021   HDL 62 11/03/2021  LDLCALC 97 11/03/2021   LDLDIRECT 95 12/30/2020   TRIG 58 11/03/2021   CHOLHDL 2.9 07/01/2021   Lab Results  Component Value Date   VD25OH 57.3 03/30/2022   VD25OH 30.5 11/03/2021   VD25OH 47.7 05/20/2021   Lab Results  Component Value Date   WBC 5.3 03/30/2022   HGB 12.5 03/30/2022   HCT 39.5 03/30/2022   MCV 68 (L) 03/30/2022   PLT 236 03/30/2022   Lab Results  Component Value Date   IRON 63 11/03/2021   TIBC 332 11/03/2021   FERRITIN 72 11/03/2021   Attestation Statements:   Reviewed by clinician on day of visit: allergies, medications, problem list, medical history, surgical history, family history, social history, and previous encounter notes.  ILennette Bihari, CMA, am acting as transcriptionist for Dr. Raliegh Scarlet, DO.  I have reviewed the above documentation for accuracy and completeness, and I agree with the above. Marjory Sneddon, D.O.  The [place] was signed into law in 2016 which includes the topic of electronic health records.  This provides immediate access to information in MyChart.  This includes consultation notes, operative notes, office notes, lab results and pathology reports.  If you have any questions about what you read please let us know at your next visit so we can discuss your concerns and take corrective action if need be.   We are right here with you.

## 2022-06-21 ENCOUNTER — Encounter: Admitting: Physical Therapy

## 2022-06-28 ENCOUNTER — Encounter: Admitting: Physical Therapy

## 2022-07-01 ENCOUNTER — Other Ambulatory Visit (INDEPENDENT_AMBULATORY_CARE_PROVIDER_SITE_OTHER): Payer: Self-pay | Admitting: Family Medicine

## 2022-07-01 DIAGNOSIS — R7303 Prediabetes: Secondary | ICD-10-CM

## 2022-07-05 ENCOUNTER — Encounter: Admitting: Physical Therapy

## 2022-07-08 ENCOUNTER — Ambulatory Visit: Payer: BC Managed Care – PPO | Attending: Obstetrics and Gynecology | Admitting: Physical Therapy

## 2022-07-08 DIAGNOSIS — N393 Stress incontinence (female) (male): Secondary | ICD-10-CM | POA: Insufficient documentation

## 2022-07-08 DIAGNOSIS — R278 Other lack of coordination: Secondary | ICD-10-CM | POA: Diagnosis present

## 2022-07-08 DIAGNOSIS — R2689 Other abnormalities of gait and mobility: Secondary | ICD-10-CM | POA: Diagnosis present

## 2022-07-08 NOTE — Therapy (Signed)
OUTPATIENT PHYSICAL THERAPY TREATMENT NOTE  / Recert    Patient Name: Molly Bean MRN: [id number] DOB:[birth year], 61 y.o., female Today's Date: 07/09/2022  REFERRING PROVIDER: Wannetta Sender   PT End of Session - 07/08/22 1640     Visit Number 35    Date for PT Re-Evaluation 85/88/50   PN & recert 2/77/41, 11/11/76, 07/09/22    PT Start Time 1640    PT Stop Time 1720    PT Time Calculation (min) 40 min    Activity Tolerance Patient tolerated treatment well    Behavior During Therapy WFL for tasks assessed/performed             Past Medical History:  Diagnosis Date   Anxiety    Constipation    DEPRESSION    edema lower extremeties    GERD    Joint pain    MENOPAUSAL SYNDROME    Multiple food allergies    OBESITY    Shortness of breath    Sleep apnea    wears CPAP   Past Surgical History:  Procedure Laterality Date   ABDOMINAL HYSTERECTOMY  11/1998   total fibroids   CESAREAN SECTION     Patient Active Problem List   Diagnosis Date Noted   At risk for dehydration 03/31/2022   Axillary hidradenitis suppurativa 07/07/2021   Proteus mirabilis infection 01/16/2021   Cutaneous abscess of right axilla 01/13/2021   Iron deficiency anemia 12/10/2020   At risk for side effect of medication 12/10/2020   Other constipation 08/19/2020   Prediabetes 08/06/2020   Chronic constipation 08/06/2020   Other hyperlipidemia 08/06/2020   Vitamin D deficiency 08/06/2020   Vaginitis 04/09/2020   Essential hypertension 05/01/2018   Asthma 03/14/2018   Hidradenitis suppurativa 01/18/2018   GAD (generalized anxiety disorder) 10/11/2016   MENOPAUSAL SYNDROME 04/23/2009   OBESITY 02/15/2008   Mood disorder ([place]) 04/12/2007   Gastroesophageal reflux disease 04/12/2007    REFERRING DIAG: R32 (ICD-10-CM) - Urinary incontinence, unspecified type  THERAPY DIAG:  Stress incontinence of urine   Other abnormalities of gait and mobility   Other lack of coordination    Rationale  for Evaluation and Treatment Rehabilitation  PERTINENT HISTORY: C-section, abdominal hysterectomy   PRECAUTIONS: N/A  SUBJECTIVE:                Pt reports leakage is still happening but it is better than before by 60%.   PAIN:  Are you having pain? No   [facility] PT Assessment - 07/08/22 1714       Other:   Other/ Comments dolphin mini squat : upper trap overuse, poor scapular retraction/ depression             [facility] PT Assessment - 07/08/22 1714       Other:   Other/ Comments dolphin mini squat : upper trap overuse, poor scapular retraction/ depression               Pelvic Floor Special Questions - 07/08/22 1643     Pelvic Floor Internal Exam pt consented verbally and had no contraindications    Exam Type Vaginal , no pelvic floor tightness and proper technique without cues             [facility] Adult PT Treatment/Exercise - 07/09/22 1121       Therapeutic Activites    Other Therapeutic Activities reassessed goals, FOTO      Neuro Re-ed    Neuro Re-ed Details  cued for pelvic floor contractions  Manual Therapy   Internal Pelvic Floor reassessed pelvic floor and kegels              [facility] Adult PT Treatment/Exercise - 07/09/22 1121       Therapeutic Activites    Other Therapeutic Activities reassessed goals, FOTO      Neuro Re-ed    Neuro Re-ed Details  cued for pelvic floor contractions      Manual Therapy   Internal Pelvic Floor reassessed pelvic floor and kegels                PATIENT EDUCATION: Education details: withhold long contractions, focus on not letting abdominal mm overuse    Person educated: Patient Education method: Explanation, Demonstration, Tactile cues, Verbal cues, and Handouts Education comprehension: verbalized understanding, returned demonstration, verbal cues required, tactile cues required, and needs further education   HOME EXERCISE PROGRAM:                             PT Long Term Goals -        PT LONG TERM GOAL #1   Title Pt will demo increased hip abduction strength from 3/5 to >4/5 in order to maintain walking routine and pelvic stability    Time 8    Period Weeks    Status Achieved    Target Date 02/03/22      PT LONG TERM GOAL #2   Title Pt will demo proper deepcore coordination and pelvic floor movement with no cues to progress to kegel exercises    Time 4    Period Weeks    Status Achieved    Target Date 02/03/22      PT LONG TERM GOAL #3   Title Pt will demo decreased abdominal scar restrictions to advance to deep core exercises and promote IAP for continence and bowel movement elimination with less dependence on Linzess    Time 4    Period Weeks    Status Achieved    Target Date 02/03/22      PT LONG TERM GOAL #4   Title Pt will increase FOTO score for constipation from 41 pts and Urinary 47 pts to > 55 pts in order to demo improved pelvic floor function    ( 06/15/21: no change urinary, 2 pt change improvement  with bowel function,    11/25/21: neg scores of change, need to read[place] because pt has reported improvements,   04/21/22: constipation 6 pt change, urinary -2 pt change,      07/08/22: constipation constipation 2pt,  urinary 0 pts) )    Time 10    Period Weeks    Status On-going    Target Date 09/16/2022     PT LONG TERM GOAL #5   Title Pt will demo decreased pelvic floor mm tightness and proper relaxation of mm to have less pain with intercourcse and gynecological exams    Time 10    Period Weeks    Status Achieved    Target Date 02/03/22      PT LONG TERM GOAL #6   Title Pt will demo no pelvic floor tightness/ tendernessacorss 2 sessions, demo proper quick contractions > 5 reps and long holds 3sec, 5 reps without cues ino rder to gain continence    Time 6    Period Weeks    Status Achieved    Target Date 06/30/2022     PT LONG TERM GOAL #7  Title Pt will report dry pad with walking across 2 days in order to be continence with walking  routine    Time 10    Period Weeks    Status On-going    Target Date 09/16/2022        PT LONG TERM GOAL #8  Title Pt will perform fitness exercises with proper alignment and technique to minimize overactivity of pelvic floor and incontinence   Time 10   Period Weeks   Status On-going   Target Date 09/16/2022        Plan -     Clinical Impression Statement Pt has achieved 5/8 goals and progressing well towards remaining goals. Bowel movements and LBP have improved. Pt reports improvement with 60% but is still leaking. Pt demo'd no more pelvic floor mm tightness and demo'd proper coordination of pelvic floor with no more straining from abdominal mm. Urethra and bladder position is no longer lowered. Progressed pt to fitness type exercises to continue improving deep core and pelvic floor IAP system and minimize leakage / relapse of LBP.   Anticipate pt will achieve remaining goals with  continued  skilled PT .     Examination-Activity Limitations Toileting;Continence;Other    Stability/Clinical Decision Making Evolving/Moderate complexity    Rehab Potential Good    PT Frequency 1x / week    PT Duration Other (comment)   10   PT Treatment/Interventions Neuromuscular re-education;Cryotherapy;ADLs/Self Care Home Management;Therapeutic activities;Functional mobility training;Traction;Moist Heat;Stair training;Gait training;Patient/family education;Taping;Manual techniques;Scar mobilization;Balance training;Therapeutic exercise;Spinal Manipulations;Joint Manipulations;Energy conservation    Consulted and Agree with Plan of Care Patient               Jerl Mina, PT 07/09/2022, 11:31 AM

## 2022-07-08 NOTE — Patient Instructions (Signed)
AM and night:  Back:  Pelvic floor long holds before and after deep core level 1-2   3 sec, 7 reps  2 breaths rest   ___  During meals:   Seated quick contractions:   5 quick and let go.    ___  Dolphin squats on wall   Fingers interlaced, elbows shoulder width apart, forearms in a triangle pressing against the wall Mini squat position   Inhale, exhale chin tucked, shoulders lengthen down from ears, as you rise up not locking knees, hairline brushes by thumbs ( "like dolphin snout / unicorn diving up our the water"  20 reps.  Make sure to not let the front ribs flare out, ribs over the pelvis aligned to not have a swayed back  Pressure through ballmounds to rise up, and dont lock the knees

## 2022-07-14 ENCOUNTER — Ambulatory Visit: Payer: BC Managed Care – PPO | Admitting: Physical Therapy

## 2022-07-15 ENCOUNTER — Encounter (INDEPENDENT_AMBULATORY_CARE_PROVIDER_SITE_OTHER): Payer: Self-pay | Admitting: Family Medicine

## 2022-07-15 ENCOUNTER — Ambulatory Visit (INDEPENDENT_AMBULATORY_CARE_PROVIDER_SITE_OTHER): Payer: BC Managed Care – PPO | Admitting: Family Medicine

## 2022-07-15 VITALS — BP 108/66 | HR 70 | Temp 98.1°F | Ht 62.0 in | Wt 210.0 lb

## 2022-07-15 DIAGNOSIS — E559 Vitamin D deficiency, unspecified: Secondary | ICD-10-CM | POA: Diagnosis not present

## 2022-07-15 DIAGNOSIS — R7303 Prediabetes: Secondary | ICD-10-CM

## 2022-07-15 DIAGNOSIS — K5909 Other constipation: Secondary | ICD-10-CM | POA: Diagnosis not present

## 2022-07-15 DIAGNOSIS — Z6838 Body mass index (BMI) 38.0-38.9, adult: Secondary | ICD-10-CM

## 2022-07-15 DIAGNOSIS — E669 Obesity, unspecified: Secondary | ICD-10-CM | POA: Diagnosis not present

## 2022-07-15 MED ORDER — VITAMIN D (ERGOCALCIFEROL) 1.25 MG (50000 UNIT) PO CAPS: ORAL_CAPSULE | ORAL | 0 refills | Status: DC

## 2022-07-15 MED ORDER — METFORMIN HCL 500 MG PO TABS: 500.0000 mg | ORAL_TABLET | Freq: Two times a day (BID) | ORAL | 0 refills | Status: DC

## 2022-07-15 MED ORDER — LINACLOTIDE 145 MCG PO CAPS: ORAL_CAPSULE | ORAL | 0 refills | Status: DC

## 2022-07-15 MED ORDER — SEMAGLUTIDE-WEIGHT MANAGEMENT 0.25 MG/0.5ML ~~LOC~~ SOAJ: 0.2500 mg | SUBCUTANEOUS | 0 refills | Status: AC

## 2022-07-20 ENCOUNTER — Ambulatory Visit (INDEPENDENT_AMBULATORY_CARE_PROVIDER_SITE_OTHER): Payer: BC Managed Care – PPO | Admitting: Obstetrics and Gynecology

## 2022-07-20 ENCOUNTER — Encounter: Payer: Self-pay | Admitting: Obstetrics and Gynecology

## 2022-07-20 VITALS — BP 122/84 | HR 67

## 2022-07-20 DIAGNOSIS — M62838 Other muscle spasm: Secondary | ICD-10-CM | POA: Diagnosis not present

## 2022-07-20 DIAGNOSIS — N3946 Mixed incontinence: Secondary | ICD-10-CM | POA: Diagnosis not present

## 2022-07-20 NOTE — Progress Notes (Signed)
[facility] Return Visit  SUBJECTIVE  History of Present Illness: Joleah Kosak is a 61 y.o. female seen in follow-up for mixed incontinence and levator spasm.   PT has been working well for her. Her pain has subsided but the leakage is still present. She feels that she has better control overall of her leakage. Again, not always aware of when leakage is happening.    Still has pain with sex at the introitus. She has been working on using different lubricants. Emla cream really did not work. She has discussed using dilators with PT.   Past Medical History: Patient  has a past medical history of Anxiety, Constipation, DEPRESSION, edema lower extremeties, GERD, Joint pain, MENOPAUSAL SYNDROME, Multiple food allergies, OBESITY, Shortness of breath, and Sleep apnea.   Past Surgical History: She  has a past surgical history that includes Cesarean section and Abdominal hysterectomy (11/1998).   Medications: She has a current medication list which includes the following prescription(s): acetaminophen, albuterol, bupropion, diazepam, estradiol, iron, lidocaine-prilocaine, linaclotide, metformin, mirabegron er, rosuvastatin, semaglutide-weight management, triamterene-hydrochlorothiazide, and vitamin d (ergocalciferol).   Allergies: Patient is allergic to shellfish allergy, oxycodone, and pennsaid [diclofenac sodium].   Social History: Patient  reports that she quit smoking about 44 years ago. Her smoking use included cigarettes. She has a 6.00 pack-year smoking history. She quit smokeless tobacco use about 34 years ago. She reports that she does not drink alcohol and does not use drugs.      OBJECTIVE     Physical Exam: Vitals:   07/20/22 0804  BP: 122/84  Pulse: 67    Gen: No apparent distress, A&O x 3.  Detailed Urogynecologic Evaluation:  Deferred.    ASSESSMENT AND PLAN    Ms. Cielo is a 61 y.o. with:  No diagnosis found.    Vaginal atrophy - Continue  estrace cream 0.5g twice a week  2. Dyspareunia/ levator spasm - Continue with PT   3. Mixed incontinence - Did not respond to OAB medications - We discussed specifics of urodynamic testing and that the test can give [place] more information about what is causing her leakage which will help [place] to guide treatment.  - She would like to continue with PT for now and will inform us if she wants to proceed with urodynamics.   Follow up as needed  Jaquita Folds, MD  Time spent: I spent 20 minutes dedicated to the care of this patient on the date of this encounter to include pre-visit review of records, face-to-face time with the patient and post visit documentation and ordering medication/ testing.

## 2022-07-21 ENCOUNTER — Ambulatory Visit: Payer: BC Managed Care – PPO | Admitting: Physical Therapy

## 2022-07-21 DIAGNOSIS — N393 Stress incontinence (female) (male): Secondary | ICD-10-CM

## 2022-07-21 DIAGNOSIS — R278 Other lack of coordination: Secondary | ICD-10-CM

## 2022-07-21 DIAGNOSIS — R2689 Other abnormalities of gait and mobility: Secondary | ICD-10-CM

## 2022-07-21 NOTE — Patient Instructions (Signed)
Strengthening feet arches:    __ Stretches : Calf stretch - two types:   - toe extension against the wall while other knee is bent, pull buttocks back  5 breaths     - lunge position, back foot is down, heel and toes straight     Bent the back knee  ___   WALKING WITH RESISTANCE BLUE Band at waist connected to doorknob 104mns Stepping forward normal length steps, planting mid and forefoot down, center of mass ( navel) leans forward slightly as if you were walking uphill 3-4 steps till band feels taut ( MAKE SURE THE DOOR IS LOCKED AND WON'T OPEN)   Stepping backwards, lower heel slowly, carry trunk and hips back , leaning forward, front knee along 2-3 rd toe line    3 min  ____   Dolphin squats on wall   Fingers interlaced, elbows shoulder width apart, forearms in a triangle pressing against the wall Mini squat position   Inhale, exhale chin tucked, shoulders lengthen down from ears, as you rise up not locking knees, hairline brushes by thumbs ( "like dolphin snout / unicorn diving up our the water" , heels raise up   15 reps.  Make sure to not let the front ribs flare out, ribs over the pelvis aligned to not have a swayed back  Pressure through ballmounds to rise up, and dont lock the knees

## 2022-07-21 NOTE — Therapy (Addendum)
OUTPATIENT PHYSICAL THERAPY TREATMENT NOTE     Patient Name: Molly Bean MRN: [id number] DOB:11-09-1960, 61 y.o., female Today's Date: 07/21/2022  REFERRING PROVIDER: Wannetta Sender   PT End of Session - 07/21/22 0806     Visit Number 36    Date for PT Re-Evaluation 09/98/33   PN & recert 05/28/04, 12/11/74, 07/08/22   PT Start Time 0805    PT Stop Time 0845    PT Time Calculation (min) 40 min    Activity Tolerance Patient tolerated treatment well    Behavior During Therapy [facility] for tasks assessed/performed             Past Medical History:  Diagnosis Date   Anxiety    Constipation    DEPRESSION    edema lower extremeties    GERD    Joint pain    MENOPAUSAL SYNDROME    Multiple food allergies    OBESITY    Shortness of breath    Sleep apnea    wears CPAP   Past Surgical History:  Procedure Laterality Date   ABDOMINAL HYSTERECTOMY  11/1998   total fibroids   CESAREAN SECTION     Patient Active Problem List   Diagnosis Date Noted   At risk for dehydration 03/31/2022   Axillary hidradenitis suppurativa 07/07/2021   Proteus mirabilis infection 01/16/2021   Cutaneous abscess of right axilla 01/13/2021   Iron deficiency anemia 12/10/2020   At risk for side effect of medication 12/10/2020   Other constipation 08/19/2020   Prediabetes 08/06/2020   Chronic constipation 08/06/2020   Other hyperlipidemia 08/06/2020   Vitamin D deficiency 08/06/2020   Vaginitis 04/09/2020   Essential hypertension 05/01/2018   Asthma 03/14/2018   Hidradenitis suppurativa 01/18/2018   GAD (generalized anxiety disorder) 10/11/2016   MENOPAUSAL SYNDROME 04/23/2009   OBESITY 02/15/2008   Mood disorder ([place]) 04/12/2007   Gastroesophageal reflux disease 04/12/2007    REFERRING DIAG: R32 (ICD-10-CM) - Urinary incontinence, unspecified type  THERAPY DIAG:  Stress incontinence of urine   Other abnormalities of gait and mobility   Other lack of coordination    Rationale for  Evaluation and Treatment Rehabilitation  PERTINENT HISTORY: C-section, abdominal hysterectomy   PRECAUTIONS: N/A  SUBJECTIVE:                Pt reports she plans to walk more with her husband with the weather getting cooler. Pt is not stretching after walking. Pt notices her pads are not as wet as they were before but she also is still not aware when she is leaking.   PAIN:  Are you having pain? No     [facility] PT Assessment - 07/21/22 0853       Coordination   Coordination and Movement Description hyperextension of knee, poor propioception of lower kientic chain with backward walking      Strength   Overall Strength Comments SLS  heel raises with hand on counter, MMT 3/5  10 reps B             [facility] Adult PT Treatment/Exercise - 07/21/22 0844       Therapeutic Activites    Other Therapeutic Activities discussed holding off on yoga classes until therapist goes over principles of alignment and ways to minimize yoga injuries      Neuro Re-ed    Neuro Re-ed Details  excessive cues for lower kinetic chain poropioception for SLS weight bearing, les shyperextension of knees , cued for heel raises, calf stretches  PATIENT EDUCATION: Education details: withhold long contractions, focus on not letting abdominal mm overuse    Person educated: Patient Education method: Explanation, Demonstration, Tactile cues, Verbal cues, and Handouts Education comprehension: verbalized understanding, returned demonstration, verbal cues required, tactile cues required, and needs further education   HOME EXERCISE PROGRAM:                             PT Long Term Goals -       PT LONG TERM GOAL #1   Title Pt will demo increased hip abduction strength from 3/5 to >4/5 in order to maintain walking routine and pelvic stability    Time 8    Period Weeks    Status Achieved    Target Date 02/03/22      PT LONG TERM GOAL #2   Title Pt will demo proper deepcore coordination  and pelvic floor movement with no cues to progress to kegel exercises    Time 4    Period Weeks    Status Achieved    Target Date 02/03/22      PT LONG TERM GOAL #3   Title Pt will demo decreased abdominal scar restrictions to advance to deep core exercises and promote IAP for continence and bowel movement elimination with less dependence on Linzess    Time 4    Period Weeks    Status Achieved    Target Date 02/03/22      PT LONG TERM GOAL #4   Title Pt will increase FOTO score for constipation from 41 pts and Urinary 47 pts to > 55 pts in order to demo improved pelvic floor function    ( 06/15/21: no change urinary, 2 pt change improvement  with bowel function,    11/25/21: neg scores of change, need to read[place] because pt has reported improvements,   04/21/22: constipation 6 pt change, urinary -2 pt change,      07/08/22: constipation constipation 2pt,  urinary 0 pts) )    Time 10    Period Weeks    Status On-going    Target Date 09/16/2022     PT LONG TERM GOAL #5   Title Pt will demo decreased pelvic floor mm tightness and proper relaxation of mm to have less pain with intercourcse and gynecological exams    Time 10    Period Weeks    Status Achieved    Target Date 02/03/22      PT LONG TERM GOAL #6   Title Pt will demo no pelvic floor tightness/ tendernessacorss 2 sessions, demo proper quick contractions > 5 reps and long holds 3sec, 5 reps without cues ino rder to gain continence    Time 6    Period Weeks    Status Achieved    Target Date 06/30/2022     PT LONG TERM GOAL #7   Title Pt will report dry pad with walking across 2 days in order to be continence with walking routine    Time 10    Period Weeks    Status On-going    Target Date 09/16/2022        PT LONG TERM GOAL #8  Title Pt will perform fitness exercises with proper alignment and technique to minimize overactivity of pelvic floor and incontinence   Time 10   Period Weeks   Status On-going    Target Date 09/16/2022        Plan -  Clinical Impression Statement Advanced to lower kinetic chain propiopcetion and strengthening to engage in feet arches, less hyperextended knees.  Pt required cues for propioception and less hyperextension of knees.  Pt is interested in attending yoga classes but educated to withhold until therapist has educated pt on principles of alignment and ways to minimize yoga injuries.  Pt voiced understanding.   Plan to progress with pelvic floor exercises next session    Anticipate pt will achieve remaining goals with  continued  skilled PT .     Examination-Activity Limitations Toileting;Continence;Other    Stability/Clinical Decision Making Evolving/Moderate complexity    Rehab Potential Good    PT Frequency 1x / week    PT Duration Other (comment)   10   PT Treatment/Interventions Neuromuscular re-education;Cryotherapy;ADLs/Self Care Home Management;Therapeutic activities;Functional mobility training;Traction;Moist Heat;Stair training;Gait training;Patient/family education;Taping;Manual techniques;Scar mobilization;Balance training;Therapeutic exercise;Spinal Manipulations;Joint Manipulations;Energy conservation    Consulted and Agree with Plan of Care Patient               Jerl Mina, PT 07/21/2022, 8:07 AM

## 2022-07-24 NOTE — Progress Notes (Signed)
Chief Complaint:   OBESITY Molly Bean is here to discuss her progress with her obesity treatment plan along with follow-up of her obesity related diagnoses. Molly Bean is on the Category 1 Plan and states she is following her eating plan approximately 90% of the time. Molly Bean states she is walking 60 minutes 6 times per week.  Today's visit was #: 32 Starting weight: 240 lbs Starting date: 04/14/2020 Today's weight: 210 lbs Today's date: 07/15/2022 Total lbs lost to date: 30 Total lbs lost since last in-office visit: +2  Interim History: Molly Bean is here for a follow up office visit.  We reviewed her meal plan and all questions were answered.  Patient's food recall appears to be accurate and consistent with what is on plan when she is following it.   When eating on plan, her hunger and cravings are well controlled.    Subjective:   1. Pre-diabetes Insurance stopped coverage of Ozempic, therefore pt stopped it 2 weeks ago. Pt desires change to another GLP-1.  2. Other constipation Zniyah is up to 7 bottles of water per day.  3. Vitamin D deficiency She is currently taking prescription vitamin D 50,000 IU twice a week. She denies nausea, vomiting or muscle weakness.  Assessment/Plan:  No orders of the defined types were placed in this encounter.   Medications Discontinued During This Encounter  Medication Reason   Semaglutide, 1 MG/DOSE, 4 MG/3ML SOPN    Vitamin D, Ergocalciferol, (DRISDOL) 1.25 MG (50000 UNIT) CAPS capsule Reorder   linaclotide (LINZESS) 145 MCG CAPS capsule Reorder   metFORMIN (GLUCOPHAGE) 500 MG tablet Reorder     Meds ordered this encounter  Medications   linaclotide (LINZESS) 145 MCG CAPS capsule    Sig: 2 po qd    Dispense:  60 capsule    Refill:  0   metFORMIN (GLUCOPHAGE) 500 MG tablet    Sig: Take 1 tablet (500 mg total) by mouth 2 (two) times daily with a meal.    Dispense:  60 tablet    Refill:  0    Ov for rf   Vitamin D,  Ergocalciferol, (DRISDOL) 1.25 MG (50000 UNIT) CAPS capsule    Sig: 1 po q wed, and 1 po q sun    Dispense:  8 capsule    Refill:  0    Ov for rf   Semaglutide-Weight Management 0.25 MG/0.5ML SOAJ    Sig: Inject 0.25 mg into the skin once a week.    Dispense:  2 mL    Refill:  0     1. Pre-diabetes Vy will continue to work on weight loss, exercise, and decreasing simple carbohydrates to help decrease the risk of diabetes.  Discontinue Ozempic. Start [facility], as per below . Refill- metFORMIN (GLUCOPHAGE) 500 MG tablet; Take 1 tablet (500 mg total) by mouth 2 (two) times daily with a meal.  Dispense: 60 tablet; Refill: 0 Start- Semaglutide-Weight Management 0.25 MG/0.5ML SOAJ; Inject 0.25 mg into the skin once a week.  Dispense: 2 mL; Refill: 0  2. Other constipation Molly Bean was informed that a decrease in bowel movement frequency is normal while losing weight, but stools should not be hard or painful. Orders and follow up as documented in patient record.   Counseling Getting to Good Bowel Health: Your goal is to have one soft bowel movement each day. Drink at least 8 glasses of water each day. Eat plenty of fiber (goal is over 25 grams each day). It is best to  get most of your fiber from dietary sources which includes leafy green vegetables, fresh fruit, and whole grains. You may need to add fiber with the help of OTC fiber supplements. These include Metamucil, Citrucel, and Flaxseed. If you are still having trouble, try adding Miralax or Magnesium Citrate. If all of these changes do not work, Dietitian.  Refill- linaclotide (LINZESS) 145 MCG CAPS capsule; 2 po qd  Dispense: 60 capsule; Refill: 0  3. Vitamin D deficiency Low Vitamin D level contributes to fatigue and are associated with obesity, breast, and colon cancer. She agrees to continue to take prescription Vitamin D 50,000 IU twice a week and will follow-up for routine testing of Vitamin D, at least 2-3 times per  year to avoid over-replacement.  Refill- Vitamin D, Ergocalciferol, (DRISDOL) 1.25 MG (50000 UNIT) CAPS capsule; 1 po q wed, and 1 po q sun  Dispense: 8 capsule; Refill: 0  4. Obesity with current BMI of 38.4 Molly Bean is currently in the action stage of change. As such, her goal is to continue with weight loss efforts. She has agreed to the Category 1 Plan with breakfast and lunch options.   Increase protein intake and measure vegetables.  Exercise goals:  As is  Behavioral modification strategies: increasing lean protein intake and decreasing simple carbohydrates.  Molly Bean has agreed to follow-up with our clinic in 4 weeks. She was informed of the importance of frequent follow-up visits to maximize her success with intensive lifestyle modifications for her multiple health conditions.   Objective:   Blood pressure 108/66, pulse 70, temperature 98.1 F (36.7 C), height 5\' 2"  (1.575 m), weight 210 lb (95.3 kg), SpO2 100 %. Body mass index is 38.41 kg/m.  General: Cooperative, alert, well developed, in no acute distress. HEENT: Conjunctivae and lids unremarkable. Cardiovascular: Regular rhythm.  Lungs: Normal work of breathing. Neurologic: No focal deficits.   Lab Results  Component Value Date   CREATININE 0.80 03/30/2022   BUN 11 03/30/2022   NA 145 (H) 03/30/2022   K 4.3 03/30/2022   CL 105 03/30/2022   CO2 25 03/30/2022   Lab Results  Component Value Date   ALT 13 03/30/2022   AST 18 03/30/2022   ALKPHOS 74 03/30/2022   BILITOT 0.3 03/30/2022   Lab Results  Component Value Date   HGBA1C 5.7 (H) 03/30/2022   HGBA1C 5.7 (H) 11/03/2021   HGBA1C 5.9 (H) 07/01/2021   HGBA1C 6.0 (H) 03/23/2021   HGBA1C 6.2 (H) 12/30/2020   Lab Results  Component Value Date   INSULIN 9.1 03/30/2022   INSULIN 22.5 11/03/2021   INSULIN 21.1 03/23/2021   INSULIN 21.9 04/15/2020   Lab Results  Component Value Date   TSH 1.100 04/15/2020   Lab Results  Component Value Date   CHOL  170 11/03/2021   HDL 62 11/03/2021   LDLCALC 97 11/03/2021   LDLDIRECT 95 12/30/2020   TRIG 58 11/03/2021   CHOLHDL 2.9 07/01/2021   Lab Results  Component Value Date   VD25OH 57.3 03/30/2022   VD25OH 30.5 11/03/2021   VD25OH 47.7 05/20/2021   Lab Results  Component Value Date   WBC 5.3 03/30/2022   HGB 12.5 03/30/2022   HCT 39.5 03/30/2022   MCV 68 (L) 03/30/2022   PLT 236 03/30/2022   Lab Results  Component Value Date   IRON 63 11/03/2021   TIBC 332 11/03/2021   FERRITIN 72 11/03/2021   Attestation Statements:   Reviewed by clinician on day of visit:  allergies, medications, problem list, medical history, surgical history, family history, social history, and previous encounter notes.  I, Kyung Rudd, BS, CMA, am acting as transcriptionist for [employer], DO.  I have reviewed the above documentation for accuracy and completeness, and I agree with the above. [name], D.O.  The 21st Century Cures Act was signed into law in 2016 which includes the topic of electronic health records.  This provides immediate access to information in MyChart.  This includes consultation notes, operative notes, office notes, lab results and pathology reports.  If you have any questions about what you read please let us know at your next visit so we can discuss your concerns and take corrective action if need be.  We are right here with you.

## 2022-07-26 ENCOUNTER — Encounter: Payer: Self-pay | Admitting: *Deleted

## 2022-07-28 ENCOUNTER — Encounter: Admitting: Physical Therapy

## 2022-08-03 ENCOUNTER — Encounter: Admitting: Physical Therapy

## 2022-08-04 ENCOUNTER — Ambulatory Visit: Payer: BC Managed Care – PPO | Attending: Obstetrics and Gynecology | Admitting: Physical Therapy

## 2022-08-04 DIAGNOSIS — R278 Other lack of coordination: Secondary | ICD-10-CM | POA: Insufficient documentation

## 2022-08-04 DIAGNOSIS — R2689 Other abnormalities of gait and mobility: Secondary | ICD-10-CM | POA: Insufficient documentation

## 2022-08-04 DIAGNOSIS — N393 Stress incontinence (female) (male): Secondary | ICD-10-CM | POA: Insufficient documentation

## 2022-08-11 ENCOUNTER — Ambulatory Visit: Payer: BC Managed Care – PPO | Admitting: Physical Therapy

## 2022-08-17 ENCOUNTER — Encounter (INDEPENDENT_AMBULATORY_CARE_PROVIDER_SITE_OTHER): Payer: Self-pay | Admitting: Family Medicine

## 2022-08-17 ENCOUNTER — Telehealth (INDEPENDENT_AMBULATORY_CARE_PROVIDER_SITE_OTHER): Payer: BC Managed Care – PPO | Admitting: Family Medicine

## 2022-08-17 DIAGNOSIS — R7303 Prediabetes: Secondary | ICD-10-CM | POA: Diagnosis not present

## 2022-08-17 DIAGNOSIS — E559 Vitamin D deficiency, unspecified: Secondary | ICD-10-CM | POA: Diagnosis not present

## 2022-08-17 DIAGNOSIS — E669 Obesity, unspecified: Secondary | ICD-10-CM | POA: Diagnosis not present

## 2022-08-17 DIAGNOSIS — K5909 Other constipation: Secondary | ICD-10-CM | POA: Diagnosis not present

## 2022-08-17 DIAGNOSIS — Z6838 Body mass index (BMI) 38.0-38.9, adult: Secondary | ICD-10-CM

## 2022-08-17 MED ORDER — VITAMIN D (ERGOCALCIFEROL) 1.25 MG (50000 UNIT) PO CAPS: ORAL_CAPSULE | ORAL | 0 refills | Status: DC

## 2022-08-17 MED ORDER — LINACLOTIDE 145 MCG PO CAPS: ORAL_CAPSULE | ORAL | 0 refills | Status: DC

## 2022-08-17 MED ORDER — SEMAGLUTIDE (1 MG/DOSE) 4 MG/3ML ~~LOC~~ SOPN: 1.0000 mg | PEN_INJECTOR | SUBCUTANEOUS | 0 refills | Status: DC

## 2022-08-17 MED ORDER — METFORMIN HCL 500 MG PO TABS: 500.0000 mg | ORAL_TABLET | Freq: Two times a day (BID) | ORAL | 0 refills | Status: DC

## 2022-08-18 ENCOUNTER — Ambulatory Visit: Payer: BC Managed Care – PPO | Admitting: Physical Therapy

## 2022-08-23 ENCOUNTER — Ambulatory Visit: Payer: BC Managed Care – PPO | Admitting: Physical Therapy

## 2022-08-25 ENCOUNTER — Ambulatory Visit: Payer: BC Managed Care – PPO | Admitting: Physical Therapy

## 2022-08-27 NOTE — Progress Notes (Signed)
TeleHealth Visit:  Due to the COVID-19 pandemic, this visit was completed with telemedicine (audio/video) technology to reduce patient and provider exposure as well as to preserve personal protective equipment.   Molly Bean has verbally consented to this TeleHealth visit. The patient is located at home, the provider is located at the [facility] office. The participants in this visit include the listed provider and patient . The visit was conducted today via video.  Chief Complaint: OBESITY Molly Bean is here to discuss her progress with her obesity treatment plan along with follow-up of her obesity related diagnoses. Molly Bean is on the Category 1 Plan with breakfast and lunch options and states she is following her eating plan approximately 90% of the time. Molly Bean states she is walking 60 minutes 6 times per week.  Also, doing PT for 15 minutes BID.  Today's visit was #: 56 Starting weight: 240 lbs Starting date: 04/14/2020  Interim History: Family member with Covid and patient was exposed.  She is asymptomatic, she denies any issues with meal plan.  She started additional exercise program.  Molly Bean is here for a follow up office visit.  We reviewed her meal plan and all questions were answered.  Patient's food recall appears to be accurate and consistent with what is on plan when she is following it.   When eating on plan, her hunger and cravings are well controlled.     Subjective:   1. Pre-diabetes [facility] the 1 mg Ozempic in .  Patient is taking it every Thursday.    2. Other constipation She is taking Linzess daily, she denies any issues.  Patient has been regular, asymptomatic.    3. Vitamin D deficiency She is currently taking prescription vitamin D 50,000 IU each week. She denies nausea, vomiting or muscle weakness.  Assessment/Plan:  No orders of the defined types were placed in this encounter.   Medications Discontinued During This  Encounter  Medication Reason   linaclotide (LINZESS) 145 MCG CAPS capsule Reorder   metFORMIN (GLUCOPHAGE) 500 MG tablet Reorder   Vitamin D, Ergocalciferol, (DRISDOL) 1.25 MG (50000 UNIT) CAPS capsule Reorder     Meds ordered this encounter  Medications   metFORMIN (GLUCOPHAGE) 500 MG tablet    Sig: Take 1 tablet (500 mg total) by mouth 2 (two) times daily with a meal.    Dispense:  60 tablet    Refill:  0    Ov for rf   Vitamin D, Ergocalciferol, (DRISDOL) 1.25 MG (50000 UNIT) CAPS capsule    Sig: 1 po q wed, and 1 po q sun    Dispense:  8 capsule    Refill:  0    Ov for rf   linaclotide (LINZESS) 145 MCG CAPS capsule    Sig: 2 po qd    Dispense:  60 capsule    Refill:  0   Semaglutide, 1 MG/DOSE, 4 MG/3ML SOPN    Sig: Inject 1 mg as directed once a week. thursday    Dispense:  3 mL    Refill:  0    One mo supply; ov for rf     1. Pre-diabetes Check labs at next office visit.  Molly Bean will continue to work on weight loss, exercise, and decreasing simple carbohydrates to help decrease the risk of diabetes.   Refill - metFORMIN (GLUCOPHAGE) 500 MG tablet; Take 1 tablet (500 mg total) by mouth 2 (two) times daily with a meal.  Dispense: 60  tablet; Refill: 0  Refill- Semaglutide, 1 MG/DOSE, 4 MG/3ML SOPN; Inject 1 mg as directed once a week. thursday  Dispense: 3 mL; Refill: 0  2. Other constipation Continue adequate hydration, decrease exercise and continue PNP with increased fiber.   Refill - linaclotide (LINZESS) 145 MCG CAPS capsule; 2 po qd  Dispense: 60 capsule; Refill: 0  3. Vitamin D deficiency Check labs at next office visit.  Low Vitamin D level contributes to fatigue and are associated with obesity, breast, and colon cancer. She agrees to continue to take prescription Vitamin D '@50'$ ,000 IU every week and will follow-up for routine testing of Vitamin D, at least 2-3 times per year to avoid over-replacement.  Refill - Vitamin D, Ergocalciferol, (DRISDOL) 1.25 MG  (50000 UNIT) CAPS capsule; 1 po q wed, and 1 po q sun  Dispense: 8 capsule; Refill: 0  4. Obesity with current BMI of 38.4 Molly Bean is currently in the action stage of change. As such, her goal is to continue with weight loss efforts. She has agreed to the Category 1 Plan+breakfast and lunch options.   Exercise goals:  As is, 30 + minutes daily.   Behavioral modification strategies: increasing lean protein intake, decreasing simple carbohydrates, and meal planning and cooking strategies.  Molly Bean has agreed to follow-up with our clinic in 3-4 weeks. She was informed of the importance of frequent follow-up visits to maximize her success with intensive lifestyle modifications for her multiple health conditions.  Objective:   VITALS: Per patient if applicable, see vitals. GENERAL: Alert and in no acute distress. CARDIOPULMONARY: No increased WOB. Speaking in clear sentences.  PSYCH: Pleasant and cooperative. Speech normal rate and rhythm. Affect is appropriate. Insight and judgement are appropriate. Attention is focused, linear, and appropriate.  NEURO: Oriented as arrived to appointment on time with no prompting.   Lab Results  Component Value Date   CREATININE 0.80 03/30/2022   BUN 11 03/30/2022   NA 145 (H) 03/30/2022   K 4.3 03/30/2022   CL 105 03/30/2022   CO2 25 03/30/2022   Lab Results  Component Value Date   ALT 13 03/30/2022   AST 18 03/30/2022   ALKPHOS 74 03/30/2022   BILITOT 0.3 03/30/2022   Lab Results  Component Value Date   HGBA1C 5.7 (H) 03/30/2022   HGBA1C 5.7 (H) 11/03/2021   HGBA1C 5.9 (H) 07/01/2021   HGBA1C 6.0 (H) 03/23/2021   HGBA1C 6.2 (H) 12/30/2020   Lab Results  Component Value Date   INSULIN 9.1 03/30/2022   INSULIN 22.5 11/03/2021   INSULIN 21.1 03/23/2021   INSULIN 21.9 04/15/2020   Lab Results  Component Value Date   TSH 1.100 04/15/2020   Lab Results  Component Value Date   CHOL 170 11/03/2021   HDL 62 11/03/2021   LDLCALC 97  11/03/2021   LDLDIRECT 95 12/30/2020   TRIG 58 11/03/2021   CHOLHDL 2.9 07/01/2021   Lab Results  Component Value Date   VD25OH 57.3 03/30/2022   VD25OH 30.5 11/03/2021   VD25OH 47.7 05/20/2021   Lab Results  Component Value Date   WBC 5.3 03/30/2022   HGB 12.5 03/30/2022   HCT 39.5 03/30/2022   MCV 68 (L) 03/30/2022   PLT 236 03/30/2022   Lab Results  Component Value Date   IRON 63 11/03/2021   TIBC 332 11/03/2021   FERRITIN 72 11/03/2021    Attestation Statements:   Reviewed by clinician on day of visit: allergies, medications, problem list, medical history, surgical history,  family history, social history, and previous encounter notes.  I, Davy Pique, RMA, am acting as Location manager for [employer], DO.   I have reviewed the above documentation for accuracy and completeness, and I agree with the above. Marjory Sneddon, D.O.  [place] was signed into law in 2016 which includes the topic of electronic health records.  This provides immediate access to information in MyChart.  This includes consultation notes, operative notes, office notes, lab results and pathology reports.  If you have any questions about what you read please let us know at your next visit so we can discuss your concerns and take corrective action if need be.  We are right here with you.

## 2022-08-31 ENCOUNTER — Other Ambulatory Visit (INDEPENDENT_AMBULATORY_CARE_PROVIDER_SITE_OTHER): Payer: Self-pay | Admitting: Family Medicine

## 2022-08-31 DIAGNOSIS — R7303 Prediabetes: Secondary | ICD-10-CM

## 2022-08-31 DIAGNOSIS — E559 Vitamin D deficiency, unspecified: Secondary | ICD-10-CM

## 2022-09-01 ENCOUNTER — Ambulatory Visit: Payer: BC Managed Care – PPO | Admitting: Physical Therapy

## 2022-09-01 DIAGNOSIS — R2689 Other abnormalities of gait and mobility: Secondary | ICD-10-CM

## 2022-09-01 DIAGNOSIS — N393 Stress incontinence (female) (male): Secondary | ICD-10-CM | POA: Diagnosis not present

## 2022-09-01 DIAGNOSIS — R278 Other lack of coordination: Secondary | ICD-10-CM | POA: Diagnosis present

## 2022-09-01 NOTE — Patient Instructions (Signed)
Lying on back, knees bent    band under ballmounds  while laying on back w/ knees bent  "W" exercise  10 reps x 2 sets   Band is placed under feet, knees bent, feet are hip width apart Hold band with thumbs point out, keep upper arm and elbow touching the bed the whole time  - inhale and then exhale pull bands by bending elbows hands move in a "w"  (feel shoulder blades squeezing)   ________________   Oblique/ scapula stabilization   Opposite arm   Place band in "U"    band under ballmounds  while laying on back w/ knees bent     20 reps  on each side  Holding band from opposite thigh,  Inhale,    exhale then pull band across body while keeping elbow , shoulders, back of the head pressed down    ______________    Single foot lift for 6 min  Deep core coordination   ____  Hamstring stretch with ballmounds on strap, 10- reps straight 10 reps with knee out by armpit for inner hamstring

## 2022-09-01 NOTE — Therapy (Signed)
OUTPATIENT PHYSICAL THERAPY TREATMENT NOTE     Patient Name: Molly Bean MRN: [id number] DOB:July 12, 1961, 61 y.o., female Today's Date: 09/01/2022  REFERRING PROVIDER: Wannetta Sender   PT End of Session - 09/01/22 0811     Visit Number 37    Date for PT Re-Evaluation 99/35/70   PN & recert 1/77/93, 9/0/30, 07/08/22   PT Start Time 0805    PT Stop Time 0845    PT Time Calculation (min) 40 min    Activity Tolerance Patient tolerated treatment well    Behavior During Therapy [facility] for tasks assessed/performed             Past Medical History:  Diagnosis Date   Anxiety    Constipation    DEPRESSION    edema lower extremeties    GERD    Joint pain    MENOPAUSAL SYNDROME    Multiple food allergies    OBESITY    Shortness of breath    Sleep apnea    wears CPAP   Past Surgical History:  Procedure Laterality Date   ABDOMINAL HYSTERECTOMY  11/1998   total fibroids   CESAREAN SECTION     Patient Active Problem List   Diagnosis Date Noted   At risk for dehydration 03/31/2022   Axillary hidradenitis suppurativa 07/07/2021   Proteus mirabilis infection 01/16/2021   Cutaneous abscess of right axilla 01/13/2021   Iron deficiency anemia 12/10/2020   At risk for side effect of medication 12/10/2020   Other constipation 08/19/2020   Prediabetes 08/06/2020   Chronic constipation 08/06/2020   Other hyperlipidemia 08/06/2020   Vitamin D deficiency 08/06/2020   Vaginitis 04/09/2020   Essential hypertension 05/01/2018   Asthma 03/14/2018   Hidradenitis suppurativa 01/18/2018   GAD (generalized anxiety disorder) 10/11/2016   MENOPAUSAL SYNDROME 04/23/2009   OBESITY 02/15/2008   Mood disorder ([place]) 04/12/2007   Gastroesophageal reflux disease 04/12/2007    REFERRING DIAG: R32 (ICD-10-CM) - Urinary incontinence, unspecified type  THERAPY DIAG:  Stress incontinence of urine   Other abnormalities of gait and mobility   Other lack of coordination    Rationale for  Evaluation and Treatment Rehabilitation  PERTINENT HISTORY: C-section, abdominal hysterectomy   PRECAUTIONS: N/A  SUBJECTIVE:                Pt reports she has been walking on the treadmillling. Pt reports leakage is still there but it is not worse despite adding dance to her weekly routine. Pt still does her kegels exercises.   PAIN:  Are you having pain? No   [facility] PT Assessment - 09/01/22 0832       Coordination   Coordination and Movement Description pelvic perturbation with single lift in hooklying      Flexibility   Hamstrings tightness B             [facility] Adult PT Treatment/Exercise - 09/01/22 0839       Neuro Re-ed    Neuro Re-ed Details  cued for hamstring stretc, cued for cervicoscapular strenghtening with low ab strengthening, cued  for toes spreads with strap, cued for bridges for propioception and explanation to avoid bridges in routine      Exercises   Exercises Other Exercises    Other Exercises  see pt instructions             PATIENT EDUCATION: Education details: withhold long contractions, focus on not letting abdominal mm overuse    Person educated: Patient Education method: Explanation, Demonstration, Tactile cues, Verbal  cues, and Handouts Education comprehension: verbalized understanding, returned demonstration, verbal cues required, tactile cues required, and needs further education   HOME EXERCISE PROGRAM:                             PT Long Term Goals -       PT LONG TERM GOAL #1   Title Pt will demo increased hip abduction strength from 3/5 to >4/5 in order to maintain walking routine and pelvic stability    Time 8    Period Weeks    Status Achieved    Target Date 02/03/22      PT LONG TERM GOAL #2   Title Pt will demo proper deepcore coordination and pelvic floor movement with no cues to progress to kegel exercises    Time 4    Period Weeks    Status Achieved    Target Date 02/03/22      PT LONG TERM GOAL #3   Title  Pt will demo decreased abdominal scar restrictions to advance to deep core exercises and promote IAP for continence and bowel movement elimination with less dependence on Linzess    Time 4    Period Weeks    Status Achieved    Target Date 02/03/22      PT LONG TERM GOAL #4   Title Pt will increase FOTO score for constipation from 41 pts and Urinary 47 pts to > 55 pts in order to demo improved pelvic floor function    ( 06/15/21: no change urinary, 2 pt change improvement  with bowel function,    11/25/21: neg scores of change, need to readminster because pt has reported improvements,   04/21/22: constipation 6 pt change, urinary -2 pt change,      07/08/22: constipation constipation 2pt,  urinary 0 pts) )    Time 10    Period Weeks    Status On-going    Target Date 09/16/2022     PT LONG TERM GOAL #5   Title Pt will demo decreased pelvic floor mm tightness and proper relaxation of mm to have less pain with intercourcse and gynecological exams    Time 10    Period Weeks    Status Achieved    Target Date 02/03/22      PT LONG TERM GOAL #6   Title Pt will demo no pelvic floor tightness/ tendernessacorss 2 sessions, demo proper quick contractions > 5 reps and long holds 3sec, 5 reps without cues ino rder to gain continence    Time 6    Period Weeks    Status Achieved    Target Date 06/30/2022     PT LONG TERM GOAL #7   Title Pt will report dry pad with walking across 2 days in order to be continence with walking routine    Time 10    Period Weeks    Status On-going    Target Date 09/16/2022        PT LONG TERM GOAL #8  Title Pt will perform fitness exercises with proper alignment and technique to minimize overactivity of pelvic floor and incontinence   Time 10   Period Weeks   Status On-going   Target Date 09/16/2022        Plan -     Clinical Impression Statement Advanced pt to hooklying resistance band strengthening for low ab and cervicoscapular stabilization and  coordination with deep core. Excessive cues for  toe abduction and more pelvic stabilization. Gradually progress with fitness routine to help minimize SUI.   Anticipate pt will achieve remaining goals with  continued  skilled PT .     Examination-Activity Limitations Toileting;Continence;Other    Stability/Clinical Decision Making Evolving/Moderate complexity    Rehab Potential Good    PT Frequency 1x / week    PT Duration Other (comment)   10   PT Treatment/Interventions Neuromuscular re-education;Cryotherapy;ADLs/Self Care Home Management;Therapeutic activities;Functional mobility training;Traction;Moist Heat;Stair training;Gait training;Patient/family education;Taping;Manual techniques;Scar mobilization;Balance training;Therapeutic exercise;Spinal Manipulations;Joint Manipulations;Energy conservation    Consulted and Agree with Plan of Care Patient               Jerl Mina, PT 09/01/2022, 9:19 AM

## 2022-09-16 ENCOUNTER — Encounter (INDEPENDENT_AMBULATORY_CARE_PROVIDER_SITE_OTHER): Payer: Self-pay | Admitting: Family Medicine

## 2022-09-16 ENCOUNTER — Ambulatory Visit (INDEPENDENT_AMBULATORY_CARE_PROVIDER_SITE_OTHER): Payer: BC Managed Care – PPO | Admitting: Family Medicine

## 2022-09-16 VITALS — BP 132/73 | HR 59 | Temp 98.5°F | Ht 62.0 in | Wt 209.0 lb

## 2022-09-16 DIAGNOSIS — E559 Vitamin D deficiency, unspecified: Secondary | ICD-10-CM

## 2022-09-16 DIAGNOSIS — D508 Other iron deficiency anemias: Secondary | ICD-10-CM | POA: Diagnosis not present

## 2022-09-16 DIAGNOSIS — R7303 Prediabetes: Secondary | ICD-10-CM

## 2022-09-16 DIAGNOSIS — E669 Obesity, unspecified: Secondary | ICD-10-CM

## 2022-09-16 DIAGNOSIS — Z6838 Body mass index (BMI) 38.0-38.9, adult: Secondary | ICD-10-CM

## 2022-09-16 DIAGNOSIS — Z6841 Body Mass Index (BMI) 40.0 and over, adult: Secondary | ICD-10-CM

## 2022-09-16 DIAGNOSIS — K5909 Other constipation: Secondary | ICD-10-CM

## 2022-09-16 MED ORDER — LINACLOTIDE 145 MCG PO CAPS: ORAL_CAPSULE | ORAL | 0 refills | Status: DC

## 2022-09-16 MED ORDER — SEMAGLUTIDE (2 MG/DOSE) 8 MG/3ML ~~LOC~~ SOPN: 2.0000 mg | PEN_INJECTOR | SUBCUTANEOUS | 0 refills | Status: DC

## 2022-09-16 MED ORDER — VITAMIN D (ERGOCALCIFEROL) 1.25 MG (50000 UNIT) PO CAPS: ORAL_CAPSULE | ORAL | 0 refills | Status: DC

## 2022-09-16 MED ORDER — IRON 325 (65 FE) MG PO TABS: 1.0000 | ORAL_TABLET | Freq: Every day | ORAL | 0 refills | Status: DC

## 2022-09-28 IMAGING — DX DG KNEE COMPLETE 4+V*L*
4 series · 4 of 4 positions shown · non-contrast
Comparison: None.

CLINICAL DATA: Left knee pain, fell 1 month ago

EXAM:
LEFT KNEE - COMPLETE 4+ VIEW

[knee pa]
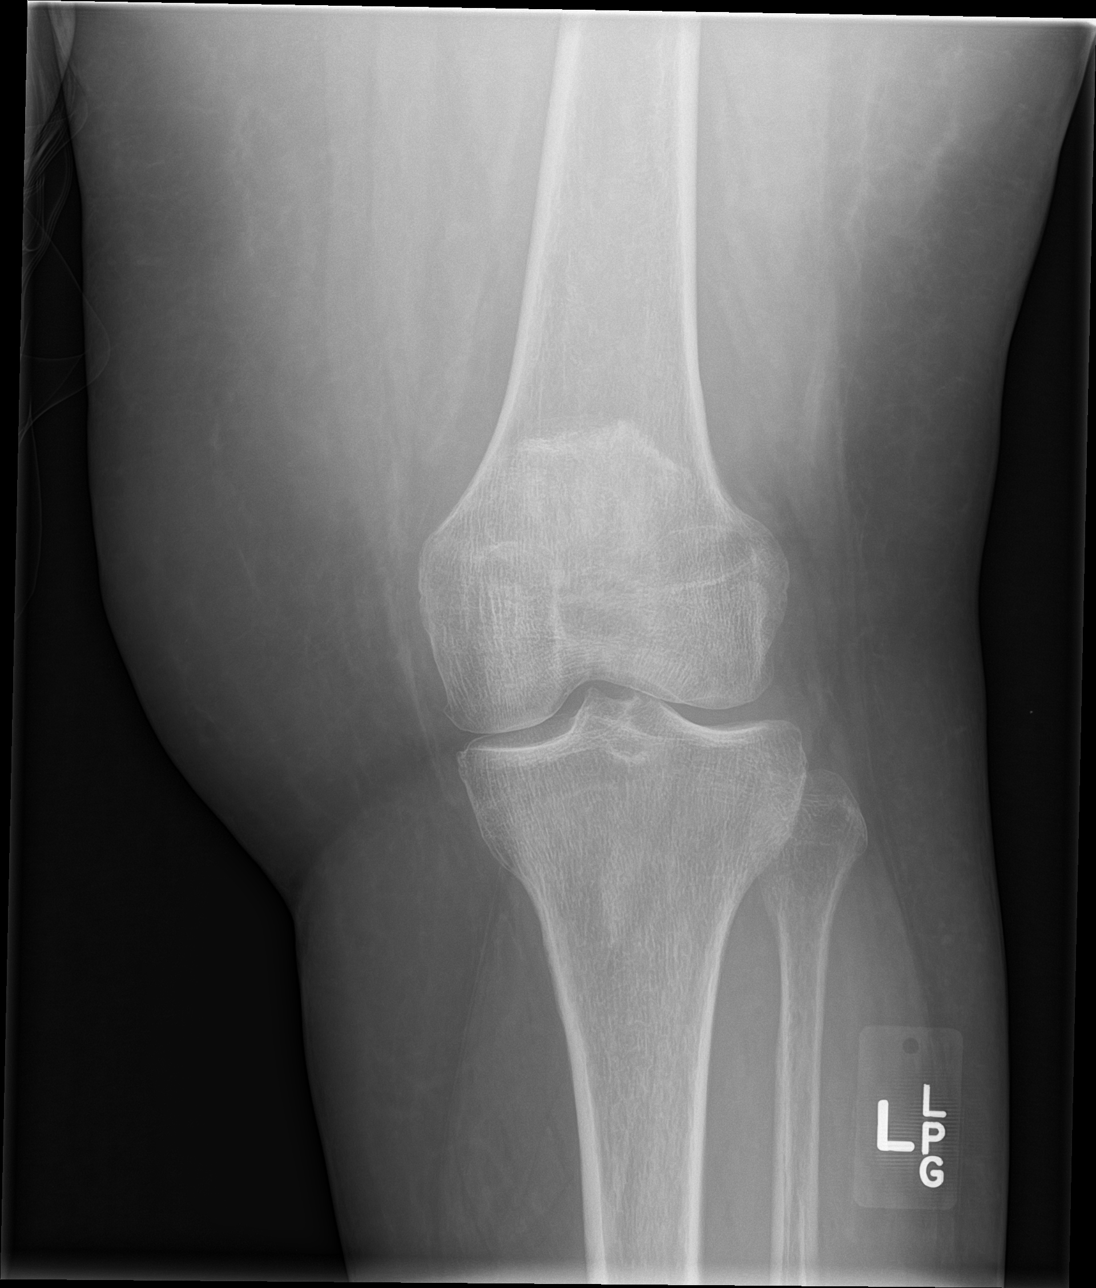

[knee obl (1 of 2)]
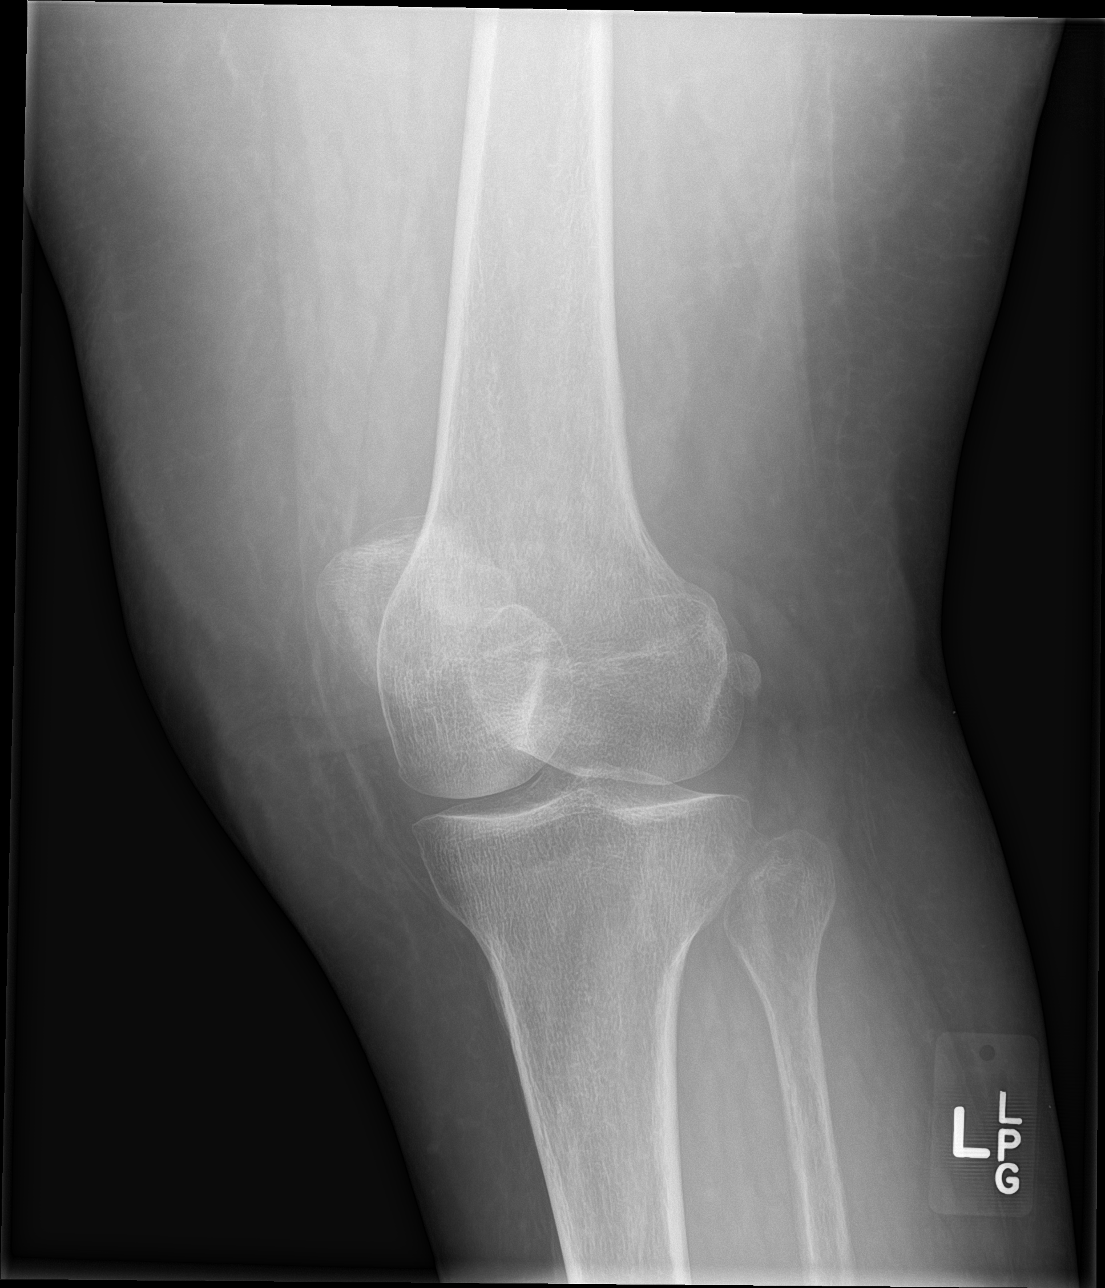

[knee obl (2 of 2)]
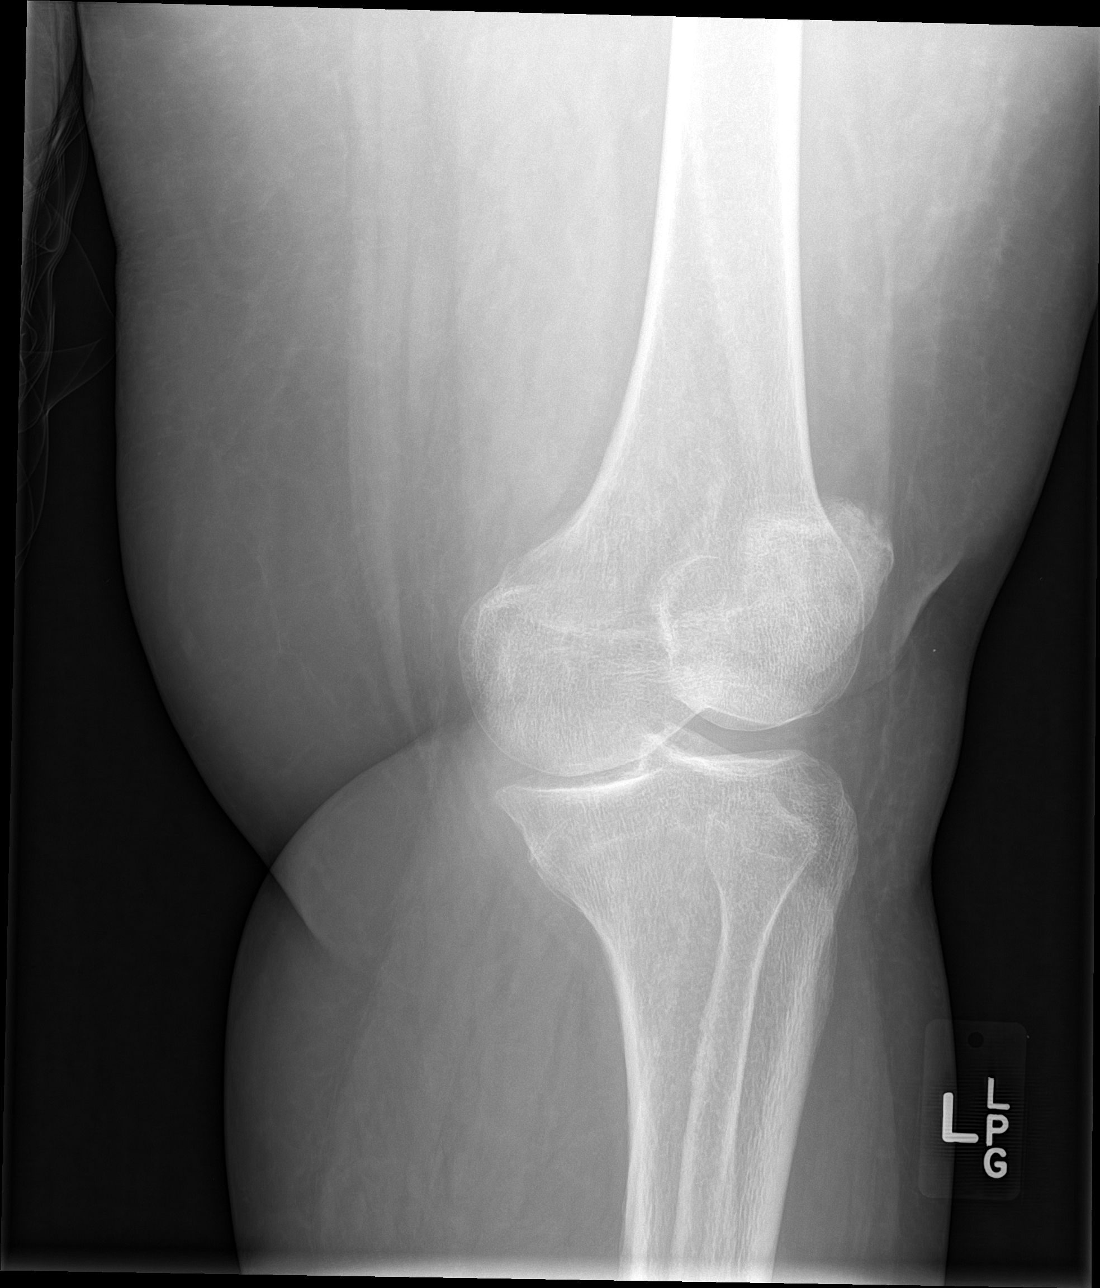

[knee lat]
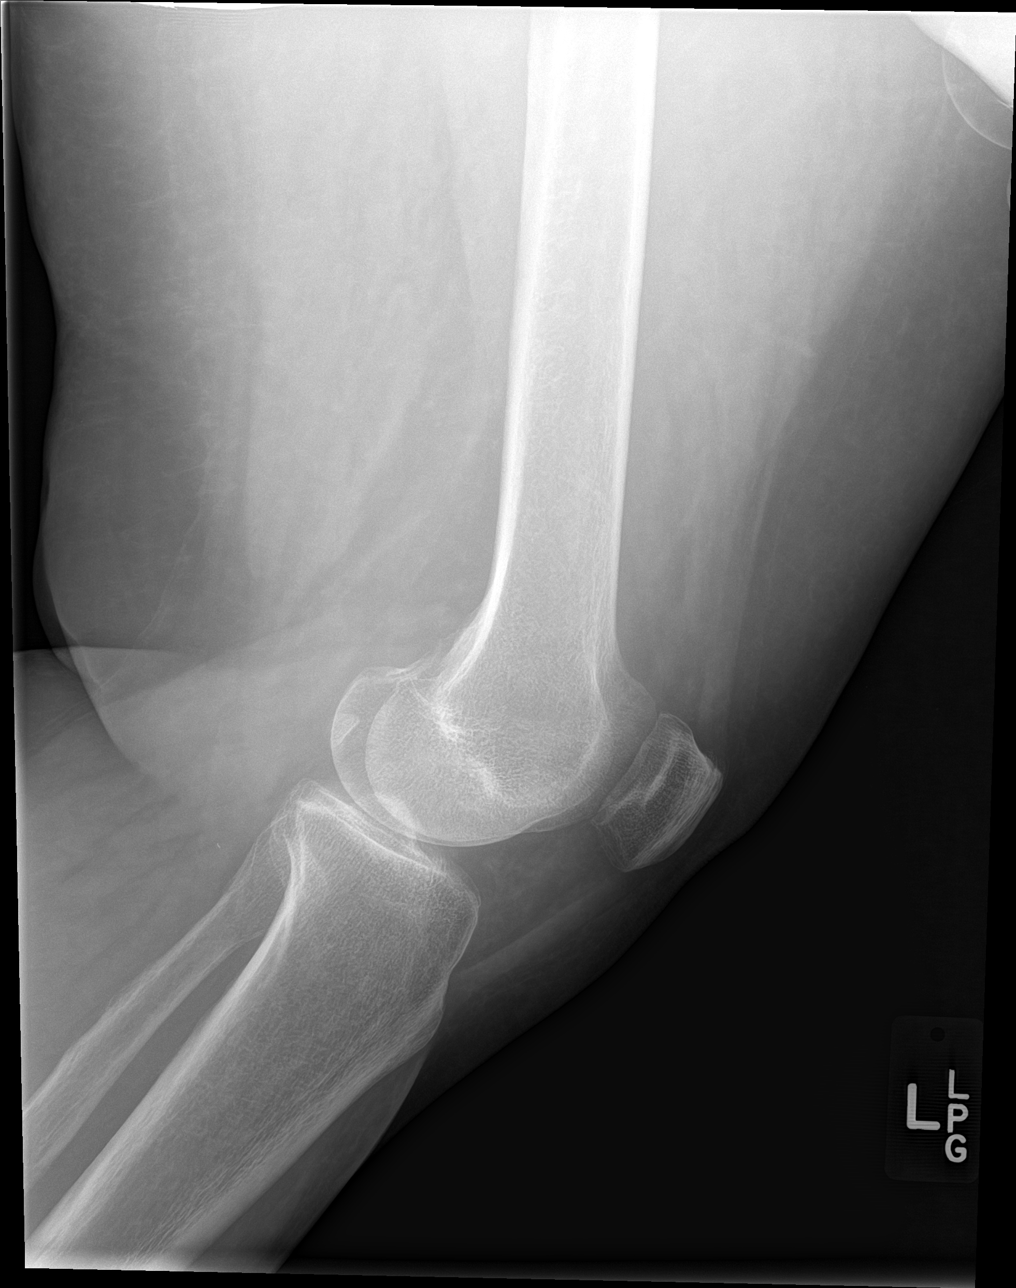

[4 of 4 positions shown; findings below may reference images not displayed]

FINDINGS: Frontal, bilateral oblique, lateral views of the left knee are
obtained. No fracture, subluxation, or dislocation. There is mild
medial compartmental joint space narrowing and osteophyte formation.
No joint effusion. Soft tissues are unremarkable.
IMPRESSION: 1. Mild medial compartmental osteoarthritis. No acute bony
abnormality.

## 2022-09-29 NOTE — Progress Notes (Signed)
Chief Complaint:   OBESITY Molly Bean is here to discuss her progress with her obesity treatment plan along with follow-up of her obesity related diagnoses. Molly Bean is on the Category 1 Plan and states she is following her eating plan approximately 95% of the time. Molly Bean states she is walking and treadmill 30 minutes 6 times per week.  Today's visit was #: 48 Starting weight: 240 lbs Starting date: 04/14/2020 Today's weight: 209 lbs Today's date: 09/16/2022 Total lbs lost to date: 31 lbs Total lbs lost since last in-office visit: 1 lb  Interim History: Molly Bean is here for a follow up office visit.  We reviewed her meal plan and all questions were answered.  Patient's food recall appears to be accurate and consistent with what is on plan when she is following it.   When eating on plan, her hunger and cravings are well controlled.  Patient did great.  Lost that mass and gain muscle mass.  No issues with meal plan.  Subjective:   1. Pre-diabetes Good control of hunger and cravings.  2. Other constipation Patient is doing great, no issues while on Linzess.  Drinking more water.  3. Vitamin D deficiency Molly Bean is tolerating medication(s) well without side effects.  Medication compliance is good as patient endorses taking it as prescribed.  Symptoms are stable and the patient denies additional concerns regarding this condition.   She is currently taking prescription vitamin D 50,000 IU each week. She denies nausea, vomiting or muscle weakness.  4. Other iron deficiency anemia Patient is asymptomatic.  No compliance with medications is tolerating well.  No increasing cough the patient.  Energy good.  Assessment/Plan:  No orders of the defined types were placed in this encounter.   Medications Discontinued During This Encounter  Medication Reason   Semaglutide, 1 MG/DOSE, 4 MG/3ML SOPN    Ferrous Sulfate (IRON) 325 (65 Fe) MG TABS Reorder   Vitamin D,  Ergocalciferol, (DRISDOL) 1.25 MG (50000 UNIT) CAPS capsule Reorder   linaclotide (LINZESS) 145 MCG CAPS capsule Reorder     Meds ordered this encounter  Medications   Vitamin D, Ergocalciferol, (DRISDOL) 1.25 MG (50000 UNIT) CAPS capsule    Sig: 1 po q wed, and 1 po q sun    Dispense:  8 capsule    Refill:  0    Ov for rf   Ferrous Sulfate (IRON) 325 (65 Fe) MG TABS    Sig: Take 1 tablet (325 mg total) by mouth daily at 2 PM.    Dispense:  30 tablet    Refill:  0   linaclotide (LINZESS) 145 MCG CAPS capsule    Sig: 2 po qd    Dispense:  60 capsule    Refill:  0   Semaglutide, 2 MG/DOSE, 8 MG/3ML SOPN    Sig: Inject 2 mg as directed once a week.    Dispense:  3 mL    Refill:  0     1. Pre-diabetes Increase Ozempic to 2 mg weekly.  Patient given instructions to slowly wean up to 54 clicks to equal 1.5 mg.  Continue PNP and weight loss and continue exercising.  2. Other constipation Continue to increase water, fiber, exercise.  Molly Bean was informed that a decrease in bowel movement frequency is normal while losing weight, but stools should not be hard or painful. Orders and follow up as documented in patient record.   Counseling Getting to Good Bowel Health: Your goal is to have one  soft bowel movement each day. Drink at least 8 glasses of water each day. Eat plenty of fiber (goal is over 25 grams each day). It is best to get most of your fiber from dietary sources which includes leafy green vegetables, fresh fruit, and whole grains. You may need to add fiber with the help of OTC fiber supplements. These include Metamucil, Citrucel, and Flaxseed. If you are still having trouble, try adding Miralax or Magnesium Citrate. If all of these changes do not work, Cabin crew.  - linaclotide (LINZESS) 145 MCG CAPS capsule; 2 po qd  Dispense: 60 capsule; Refill: 0  3. Vitamin D deficiency Low Vitamin D level contributes to fatigue and are associated with obesity, breast, and colon  cancer. She agrees to continue to take prescription Vitamin D '@50'$ ,000 IU every week and will follow-up for routine testing of Vitamin D, at least 2-3 times per year to avoid over-replacement.  Refill - Vitamin D, Ergocalciferol, (DRISDOL) 1.25 MG (50000 UNIT) CAPS capsule; 1 po q wed, and 1 po q sun  Dispense: 8 capsule; Refill: 0  4. Other iron deficiency anemia Continue and refill iron 1 daily.  Continue PNP on weight loss. Orders and follow up as documented in patient record.  Counseling Iron is essential for our bodies to make red blood cells.  Reasons that someone may be deficient include: an iron-deficient diet (more likely in those following vegan or vegetarian diets), women with heavy menses, patients with GI disorders or poor absorption, patients that have had bariatric surgery, frequent blood donors, patients with cancer, and patients with heart disease.   An iron supplement has been recommended. This is found over-the-counter. Iron-rich foods include dark leafy greens, red and white meats, eggs, seafood, and beans.   Certain foods and drinks prevent your body from absorbing iron properly. Avoid eating these foods in the same meal as iron-rich foods or with iron supplements. These foods include: coffee, black tea, and red wine; milk, dairy products, and foods that are high in calcium; beans and soybeans; whole grains.  Constipation can be a side effect of iron supplementation. Increased water and fiber intake are helpful. Water goal: > 2 liters/day. Fiber goal: > 25 grams/day.   Refill- Ferrous Sulfate (IRON) 325 (65 Fe) MG TABS; Take 1 tablet (325 mg total) by mouth daily at 2 PM.  Dispense: 30 tablet; Refill: 0  5. Obesity with current BMI of 38.2 Holiday eating guide and recipes given to patient today.  Refill - Semaglutide, 2 MG/DOSE, 8 MG/3ML SOPN; Inject 2 mg as directed once a week.  Dispense: 3 mL; Refill: 0  Molly Bean is currently in the action stage of change. As such, her  goal is to continue with weight loss efforts. She has agreed to the Category 1 Plan.   Exercise goals:  As is.  Behavioral modification strategies: increasing lean protein intake and holiday eating strategies .  Molly Bean has agreed to follow-up with our clinic in 4 weeks. She was informed of the importance of frequent follow-up visits to maximize her success with intensive lifestyle modifications for her multiple health conditions.   Objective:   Blood pressure 132/73, pulse (!) 59, temperature 98.5 F (36.9 C), height '5\' 2"'$  (1.575 m), weight 209 lb (94.8 kg), SpO2 99 %. Body mass index is 38.23 kg/m.  General: Cooperative, alert, well developed, in no acute distress. HEENT: Conjunctivae and lids unremarkable. Cardiovascular: Regular rhythm.  Lungs: Normal work of breathing. Neurologic: No focal deficits.   Lab  Results  Component Value Date   CREATININE 0.80 03/30/2022   BUN 11 03/30/2022   NA 145 (H) 03/30/2022   K 4.3 03/30/2022   CL 105 03/30/2022   CO2 25 03/30/2022   Lab Results  Component Value Date   ALT 13 03/30/2022   AST 18 03/30/2022   ALKPHOS 74 03/30/2022   BILITOT 0.3 03/30/2022   Lab Results  Component Value Date   HGBA1C 5.7 (H) 03/30/2022   HGBA1C 5.7 (H) 11/03/2021   HGBA1C 5.9 (H) 07/01/2021   HGBA1C 6.0 (H) 03/23/2021   HGBA1C 6.2 (H) 12/30/2020   Lab Results  Component Value Date   INSULIN 9.1 03/30/2022   INSULIN 22.5 11/03/2021   INSULIN 21.1 03/23/2021   INSULIN 21.9 04/15/2020   Lab Results  Component Value Date   TSH 1.100 04/15/2020   Lab Results  Component Value Date   CHOL 170 11/03/2021   HDL 62 11/03/2021   LDLCALC 97 11/03/2021   LDLDIRECT 95 12/30/2020   TRIG 58 11/03/2021   CHOLHDL 2.9 07/01/2021   Lab Results  Component Value Date   VD25OH 57.3 03/30/2022   VD25OH 30.5 11/03/2021   VD25OH 47.7 05/20/2021   Lab Results  Component Value Date   WBC 5.3 03/30/2022   HGB 12.5 03/30/2022   HCT 39.5 03/30/2022    MCV 68 (L) 03/30/2022   PLT 236 03/30/2022   Lab Results  Component Value Date   IRON 63 11/03/2021   TIBC 332 11/03/2021   FERRITIN 72 11/03/2021   Attestation Statements:   Reviewed by clinician on day of visit: allergies, medications, problem list, medical history, surgical history, family history, social history, and previous encounter notes.  I, Davy Pique, RMA, am acting as Location manager for [employer], DO.   I have reviewed the above documentation for accuracy and completeness, and I agree with the above. Marjory Sneddon, D.O.  The [place] was signed into law in 2016 which includes the topic of electronic health records.  This provides immediate access to information in MyChart.  This includes consultation notes, operative notes, office notes, lab results and pathology reports.  If you have any questions about what you read please let us know at your next visit so we can discuss your concerns and take corrective action if need be.  We are right here with you.

## 2022-10-15 ENCOUNTER — Ambulatory Visit: Payer: BC Managed Care – PPO | Attending: Obstetrics and Gynecology | Admitting: Physical Therapy

## 2022-10-15 DIAGNOSIS — R2689 Other abnormalities of gait and mobility: Secondary | ICD-10-CM | POA: Insufficient documentation

## 2022-10-15 DIAGNOSIS — N393 Stress incontinence (female) (male): Secondary | ICD-10-CM | POA: Diagnosis present

## 2022-10-15 DIAGNOSIS — R278 Other lack of coordination: Secondary | ICD-10-CM | POA: Insufficient documentation

## 2022-10-15 NOTE — Patient Instructions (Addendum)
Drink one bottle upon waking 6am Drink second bottle after the 30 min walk at 7:30am __ Drink 3 bottle 9-1pm   Drink 2 bottle between 1-4pm  Drink 1 bottle around 4-6:30pm  __   At meal times:  3 short seated kegels  In the morning, after work, bedtime:   Deep core level 1   3 sec holds, 2 rest breaths for kegels , 3 reps    Deep core level 2      3 sec holds, 2 rest breaths for kegels , 3 reps   __   SKI against chair   In front of chair, knee against seat to line knee above ankle,  back foot hip width apart, push off through ballmounds,  opp arm up, thumbs up, chin tuck, shoulders down  Push off through ballmounds , step back to under hip width   3 min

## 2022-10-15 NOTE — Therapy (Signed)
OUTPATIENT PHYSICAL THERAPY TREATMENT NOTE  / Recert     Patient Name: Molly Bean MRN: [id number] DOB:[birth year], 62 y.o., female Today's Date: 10/15/2022  REFERRING PROVIDER: Wannetta Sender   PT End of Session - 10/15/22 0807     Visit Number 38    Date for PT Re-Evaluation 32/35/57   PN & recert 12/23/00, 02/04/26, 07/08/22   PT Start Time 0802    PT Stop Time 0845    PT Time Calculation (min) 43 min    Activity Tolerance Patient tolerated treatment well    Behavior During Therapy [facility] for tasks assessed/performed             Past Medical History:  Diagnosis Date   Anxiety    Constipation    DEPRESSION    edema lower extremeties    GERD    Joint pain    MENOPAUSAL SYNDROME    Multiple food allergies    OBESITY    Shortness of breath    Sleep apnea    wears CPAP   Past Surgical History:  Procedure Laterality Date   ABDOMINAL HYSTERECTOMY  11/1998   total fibroids   CESAREAN SECTION     Patient Active Problem List   Diagnosis Date Noted   At risk for dehydration 03/31/2022   Axillary hidradenitis suppurativa 07/07/2021   Proteus mirabilis infection 01/16/2021   Cutaneous abscess of right axilla 01/13/2021   Iron deficiency anemia 12/10/2020   At risk for side effect of medication 12/10/2020   Other constipation 08/19/2020   Prediabetes 08/06/2020   Chronic constipation 08/06/2020   Other hyperlipidemia 08/06/2020   Vitamin D deficiency 08/06/2020   Vaginitis 04/09/2020   Essential hypertension 05/01/2018   Asthma 03/14/2018   Hidradenitis suppurativa 01/18/2018   GAD (generalized anxiety disorder) 10/11/2016   MENOPAUSAL SYNDROME 04/23/2009   OBESITY 02/15/2008   Mood disorder ([place]) 04/12/2007   Gastroesophageal reflux disease 04/12/2007    REFERRING DIAG: R32 (ICD-10-CM) - Urinary incontinence, unspecified type  THERAPY DIAG:  Stress incontinence of urine   Other abnormalities of gait and mobility   Other lack of coordination    Rationale  for Evaluation and Treatment Rehabilitation  PERTINENT HISTORY: C-section, abdominal hysterectomy   PRECAUTIONS: N/A  SUBJECTIVE:                Pt reports she is not able to wait to make it the bathroom before leakage when she is walking half a mile into one mile distance. Leakage amount is not worse. LBP is completely resolved.  Pt is ready for the long hold of kegels today. Pt sips her water during her walk and drank 16 fl oz before starting to walk.   Pt walks 30 min in the morning and afternoon. Pt drinks 6 bottle of water per day.  Pt is drinking her 2nd bottle when she starts her walk. Pt wakes up at 6am and then walks at 7:30am.   PAIN:  Are you having pain? No   [facility] PT Assessment - 10/15/22 0927       Coordination   Coordination and Movement Description poor proiopcetion of lower kinetic chain, toe gripping/ supination in front leg with backward lunges             [facility] Adult PT Treatment/Exercise - 10/15/22 0929       Therapeutic Activites    Other Therapeutic Activities strategy for water intake with      Neuro Re-ed    Neuro Re-ed Details  cued for  long kegels, seated kegels , standing backward lunge/ scapuothoracic strenghtening ,               PATIENT EDUCATION: Education details: withhold long contractions, focus on not letting abdominal mm overuse    Person educated: Patient Education method: Explanation, Demonstration, Tactile cues, Verbal cues, and Handouts Education comprehension: verbalized understanding, returned demonstration, verbal cues required, tactile cues required, and needs further education   HOME EXERCISE PROGRAM:                             PT Long Term Goals -       PT LONG TERM GOAL #1   Title Pt will demo increased hip abduction strength from 3/5 to >4/5 in order to maintain walking routine and pelvic stability    Time 8    Period Weeks    Status Achieved    Target Date 02/03/22      PT LONG TERM GOAL #2   Title Pt  will demo proper deepcore coordination and pelvic floor movement with no cues to progress to kegel exercises    Time 4    Period Weeks    Status Achieved    Target Date 02/03/22      PT LONG TERM GOAL #3   Title Pt will demo decreased abdominal scar restrictions to advance to deep core exercises and promote IAP for continence and bowel movement elimination with less dependence on Linzess    Time 4    Period Weeks    Status Achieved    Target Date 02/03/22      PT LONG TERM GOAL #4   Title Pt will increase FOTO score for constipation from 41 pts and Urinary 47 pts to > 55 pts in order to demo improved pelvic floor function    ( 06/15/21: no change urinary, 2 pt change improvement  with bowel function,    11/25/21: neg scores of change, need to readminster because pt has reported improvements,   04/21/22: constipation 6 pt change, urinary -2 pt change,      07/08/22: constipation constipation 2pt,  urinary 0 pts) )      Time 10    Period Weeks    Status On-going    Target Date 12/24/2022     PT LONG TERM GOAL #5   Title Pt will demo decreased pelvic floor mm tightness and proper relaxation of mm to have less pain with intercourcse and gynecological exams    Time 10    Period Weeks    Status Achieved    Target Date 02/03/22      PT LONG TERM GOAL #6   Title Pt will demo no pelvic floor tightness/ tendernessacorss 2 sessions, demo proper quick contractions > 5 reps and long holds 3sec, 5 reps without cues ino rder to gain continence    Time 6    Period Weeks    Status Ongoing    Target Date 11/26/2022      PT LONG TERM GOAL #7   Title Pt will report dry pad with walking 0.5 miles   in order to be continence with walking routine    Time 10    Period Weeks    Status On-going    Target Date 12/24/2022        PT LONG TERM GOAL #8  Title Pt will perform fitness exercises with proper alignment and technique to minimize overactivity of pelvic floor and  incontinence   Time 10    Period Weeks   Status On-going   Target Date 12/24/2022          Plan -     Clinical Impression Statement Pt has achieved 50% of her goals. LBP has completely resolved.   Pt is continuing to show no pelvic floor tightness even after she started regular treadmill walking which is a positive sign for her readiness for endurance contraction for kegel training . Discussed water intake strategy to minimize leakage when walking.   Pt has advanced to kegel contractions with proper technique. Also advanced to lower kinetic chain propioception training for fitness activity which pt required excessive cues.    Anticipate pt will achieve remaining goals with  continued  skilled PT .      Examination-Activity Limitations Toileting;Continence;Other    Stability/Clinical Decision Making Evolving/Moderate complexity    Rehab Potential Good    PT Frequency 1x / week    PT Duration Other (comment)   10   PT Treatment/Interventions Neuromuscular re-education;Cryotherapy;ADLs/Self Care Home Management;Therapeutic activities;Functional mobility training;Traction;Moist Heat;Stair training;Gait training;Patient/family education;Taping;Manual techniques;Scar mobilization;Balance training;Therapeutic exercise;Spinal Manipulations;Joint Manipulations;Energy conservation    Consulted and Agree with Plan of Care Patient               Jerl Mina, PT 10/15/2022, 10:00 AM

## 2022-10-19 ENCOUNTER — Encounter: Payer: Self-pay | Admitting: Internal Medicine

## 2022-10-19 ENCOUNTER — Ambulatory Visit (INDEPENDENT_AMBULATORY_CARE_PROVIDER_SITE_OTHER): Payer: BC Managed Care – PPO | Admitting: Internal Medicine

## 2022-10-19 VITALS — BP 118/78 | HR 79 | Temp 98.3°F | Ht 62.0 in | Wt 219.0 lb

## 2022-10-19 DIAGNOSIS — H60311 Diffuse otitis externa, right ear: Secondary | ICD-10-CM

## 2022-10-19 DIAGNOSIS — H6090 Unspecified otitis externa, unspecified ear: Secondary | ICD-10-CM | POA: Insufficient documentation

## 2022-10-19 DIAGNOSIS — Z23 Encounter for immunization: Secondary | ICD-10-CM | POA: Diagnosis not present

## 2022-10-19 MED ORDER — AMOXICILLIN 500 MG PO CAPS
500.0000 mg | ORAL_CAPSULE | Freq: Three times a day (TID) | ORAL | 0 refills | Status: AC
Start: 2022-10-19 — End: 2022-10-29

## 2022-10-19 MED ORDER — NEOMYCIN-POLYMYXIN-HC 3.5-10000-1 OT SOLN: 3.0000 [drp] | Freq: Four times a day (QID) | OTIC | 0 refills | Status: AC

## 2022-10-19 NOTE — Progress Notes (Signed)
Subjective:  Patient ID: Molly Bean, female    DOB: 1961/07/31  Age: 61 y.o. MRN: [id number]  CC: Foreign Body in Ear (Right ear. No pain. Been feeling it for 2-3 weeks.)   HPI Molly Bean presents for something in the R ear - ?pain x 3 weeks  Outpatient Medications Prior to Visit  Medication Sig Dispense Refill   acetaminophen (TYLENOL) 500 MG tablet Take 1,000 mg by mouth every 6 (six) hours as needed for mild pain or headache.     albuterol (VENTOLIN HFA) 108 (90 Base) MCG/ACT inhaler TAKE 2 PUFFS BY MOUTH EVERY 6 HOURS AS NEEDED FOR WHEEZE OR SHORTNESS OF BREATH 8.5 g 0   buPROPion (WELLBUTRIN SR) 150 MG 12 hr tablet Take 1 tablet (150 mg total) by mouth in the morning. 30 tablet 0   diazepam (VALIUM) 5 MG tablet Place 1 tablet vaginally nightly as needed for muscle spasm/ pelvic pain. 40 tablet 0   estradiol (ESTRACE) 0.1 MG/GM vaginal cream Place 0.5g nightly for two weeks then twice a week after 30 g 11   Ferrous Sulfate (IRON) 325 (65 Fe) MG TABS Take 1 tablet (325 mg total) by mouth daily at 2 PM. 30 tablet 0   lidocaine-prilocaine (EMLA) cream Apply 1 application. topically as needed. 30 g 5   linaclotide (LINZESS) 145 MCG CAPS capsule 2 po qd 60 capsule 0   metFORMIN (GLUCOPHAGE) 500 MG tablet Take 1 tablet (500 mg total) by mouth 2 (two) times daily with a meal. 60 tablet 0   mirabegron ER (MYRBETRIQ) 25 MG TB24 tablet Take 1 tablet (25 mg total) by mouth daily. 30 tablet 5   rosuvastatin (CRESTOR) 10 MG tablet TAKE 1 TABLET BY MOUTH EVERYDAY AT BEDTIME 30 tablet 0   Semaglutide, 2 MG/DOSE, 8 MG/3ML SOPN Inject 2 mg as directed once a week. 3 mL 0   triamterene-hydrochlorothiazide (MAXZIDE-25) 37.5-25 MG tablet Take 1 tablet by mouth daily. 90 tablet 3   Vitamin D, Ergocalciferol, (DRISDOL) 1.25 MG (50000 UNIT) CAPS capsule 1 po q wed, and 1 po q sun 8 capsule 0   No facility-administered medications prior to visit.    ROS: Review of Systems  Constitutional:   Negative for activity change, appetite change, chills, fatigue and unexpected weight change.  HENT:  Positive for ear pain. Negative for congestion, mouth sores and sinus pressure.   Eyes:  Negative for visual disturbance.  Respiratory:  Negative for cough and chest tightness.   Gastrointestinal:  Negative for abdominal pain and nausea.  Genitourinary:  Negative for difficulty urinating, frequency and vaginal pain.  Musculoskeletal:  Negative for back pain and gait problem.  Skin:  Negative for pallor and rash.  Neurological:  Negative for dizziness, tremors, weakness, numbness and headaches.  Psychiatric/Behavioral:  Negative for confusion and sleep disturbance.     Objective:  BP 118/78 (BP Location: Left Arm, Patient Position: Sitting, Cuff Size: Large)   Pulse 79   Temp 98.3 F (36.8 C) (Oral)   Ht '5\' 2"'$  (1.575 m)   Wt 219 lb (99.3 kg)   SpO2 98%   BMI 40.06 kg/m   BP Readings from Last 3 Encounters:  10/19/22 118/78  09/16/22 132/73  07/20/22 122/84    Wt Readings from Last 3 Encounters:  10/19/22 219 lb (99.3 kg)  09/16/22 209 lb (94.8 kg)  07/15/22 210 lb (95.3 kg)    Physical Exam Eryth skin in the R ear canal B TM OK  Lab Results  Component  Value Date   WBC 5.3 03/30/2022   HGB 12.5 03/30/2022   HCT 39.5 03/30/2022   PLT 236 03/30/2022   GLUCOSE 87 03/30/2022   CHOL 170 11/03/2021   TRIG 58 11/03/2021   HDL 62 11/03/2021   LDLDIRECT 95 12/30/2020   LDLCALC 97 11/03/2021   ALT 13 03/30/2022   AST 18 03/30/2022   NA 145 (H) 03/30/2022   K 4.3 03/30/2022   CL 105 03/30/2022   CREATININE 0.80 03/30/2022   BUN 11 03/30/2022   CO2 25 03/30/2022   TSH 1.100 04/15/2020   INR 1.00 09/25/2016   HGBA1C 5.7 (H) 03/30/2022    DG FEMUR MIN 2 VIEWS LEFT  Result Date: 05/20/2022 CLINICAL DATA:  Severe left hip and buttock pain following an injury yesterday. EXAM: LEFT FEMUR 2 VIEWS COMPARISON:  Pelvis radiographs obtained at the same time. FINDINGS: There is  no evidence of fracture or other focal bone lesions. Soft tissues are unremarkable. Posterior femoral linear lucency is compatible with a normal nutrient channel. IMPRESSION: Negative. Electronically Signed   By: Claudie Revering M.D.   On: 05/20/2022 11:10   DG Pelvis 1-2 Views  Result Date: 05/20/2022 CLINICAL DATA:  Trauma, pain EXAM: PELVIS - 1-2 VIEW COMPARISON:  None Available. FINDINGS: No displaced fracture or dislocation is seen. Phleboliths are seen in pelvis. IMPRESSION: No fracture or dislocation is seen in pelvis. Electronically Signed   By: Elmer Picker M.D.   On: 05/20/2022 11:09    Assessment & Plan:   Problem List Items Addressed This Visit       Nervous and Auditory   Otitis externa - Primary    R ear Amoxicillin po Otic cortisporin          Meds ordered this encounter  Medications   amoxicillin (AMOXIL) 500 MG capsule    Sig: Take 1 capsule (500 mg total) by mouth 3 (three) times daily for 10 days.    Dispense:  30 capsule    Refill:  0   neomycin-polymyxin-hydrocortisone (CORTISPORIN) OTIC solution    Sig: Place 3 drops into the right ear 4 (four) times daily for 5 days.    Dispense:  10 mL    Refill:  0      Follow-up: No follow-ups on file.  Walker Kehr, MD

## 2022-10-19 NOTE — Assessment & Plan Note (Signed)
R ear Amoxicillin po Otic cortisporin

## 2022-10-21 ENCOUNTER — Ambulatory Visit: Payer: BC Managed Care – PPO | Admitting: Physical Therapy

## 2022-10-26 ENCOUNTER — Ambulatory Visit: Payer: BC Managed Care – PPO | Admitting: Physical Therapy

## 2022-10-26 DIAGNOSIS — R2689 Other abnormalities of gait and mobility: Secondary | ICD-10-CM

## 2022-10-26 DIAGNOSIS — N393 Stress incontinence (female) (male): Secondary | ICD-10-CM

## 2022-10-26 DIAGNOSIS — R278 Other lack of coordination: Secondary | ICD-10-CM

## 2022-10-26 NOTE — Therapy (Signed)
OUTPATIENT PHYSICAL THERAPY TREATMENT NOTE    Patient Name: Molly Bean MRN: [id number] DOB:1961/04/24, 62 y.o., female Today's Date: 10/26/2022  REFERRING PROVIDER: Wannetta Sender   PT End of Session - 10/26/22 0944     Visit Number [id number]    Date for PT Re-Evaluation 37/90/24   PN & recert 0/97/35, 12/03/97, 07/08/22   PT Start Time 0802    PT Stop Time 0845    PT Time Calculation (min) 43 min    Activity Tolerance Patient tolerated treatment well    Behavior During Therapy [facility] for tasks assessed/performed             Past Medical History:  Diagnosis Date   Anxiety    Constipation    DEPRESSION    edema lower extremeties    GERD    Joint pain    MENOPAUSAL SYNDROME    Multiple food allergies    OBESITY    Shortness of breath    Sleep apnea    wears CPAP   Past Surgical History:  Procedure Laterality Date   ABDOMINAL HYSTERECTOMY  11/1998   total fibroids   CESAREAN SECTION     Patient Active Problem List   Diagnosis Date Noted   Otitis externa 10/19/2022   At risk for dehydration 03/31/2022   Axillary hidradenitis suppurativa 07/07/2021   Proteus mirabilis infection 01/16/2021   Cutaneous abscess of right axilla 01/13/2021   Iron deficiency anemia 12/10/2020   At risk for side effect of medication 12/10/2020   Other constipation 08/19/2020   Prediabetes 08/06/2020   Chronic constipation 08/06/2020   Other hyperlipidemia 08/06/2020   Vitamin D deficiency 08/06/2020   Vaginitis 04/09/2020   Essential hypertension 05/01/2018   Asthma 03/14/2018   Hidradenitis suppurativa 01/18/2018   GAD (generalized anxiety disorder) 10/11/2016   MENOPAUSAL SYNDROME 04/23/2009   OBESITY 02/15/2008   Mood disorder ([place]) 04/12/2007   Gastroesophageal reflux disease 04/12/2007    REFERRING DIAG: R32 (ICD-10-CM) - Urinary incontinence, unspecified type  THERAPY DIAG:  Stress incontinence of urine   Other abnormalities of gait and mobility   Other lack of  coordination    Rationale for Evaluation and Treatment Rehabilitation  PERTINENT HISTORY: C-section, abdominal hysterectomy   PRECAUTIONS: N/A  SUBJECTIVE:                Pt reports she gained 6 lbs and doesn't like that   PAIN:  Are you having pain? No   [facility] PT Assessment - 10/26/22 1116       Coordination   Coordination and Movement Description poor proiopcetion of lower kinetic chain, hyperextended knees with backward stepping, increased lumbar lordosis              [facility] Adult PT Treatment/Exercise - 10/26/22 1048       Therapeutic Activites    Other Therapeutic Activities explained HIIT workouts, guided pt to test HR 60% max with finding pulse      Neuro Re-ed    Neuro Re-ed Details  cued for foot and knee propioception for knee stbility and toe push off, toe abduciton, plantar fascia mobility in new HEP               PATIENT EDUCATION: Education details: withhold long contractions, focus on not letting abdominal mm overuse    Person educated: Patient Education method: Explanation, Demonstration, Tactile cues, Verbal cues, and Handouts Education comprehension: verbalized understanding, returned demonstration, verbal cues required, tactile cues required, and needs further education   HOME EXERCISE PROGRAM:  PT Long Term Goals -       PT LONG TERM GOAL #1   Title Pt will demo increased hip abduction strength from 3/5 to >4/5 in order to maintain walking routine and pelvic stability    Time 8    Period Weeks    Status Achieved    Target Date 02/03/22      PT LONG TERM GOAL #2   Title Pt will demo proper deepcore coordination and pelvic floor movement with no cues to progress to kegel exercises    Time 4    Period Weeks    Status Achieved    Target Date 02/03/22      PT LONG TERM GOAL #3   Title Pt will demo decreased abdominal scar restrictions to advance to deep core exercises and promote IAP for continence and  bowel movement elimination with less dependence on Linzess    Time 4    Period Weeks    Status Achieved    Target Date 02/03/22      PT LONG TERM GOAL #4   Title Pt will increase FOTO score for constipation from 41 pts and Urinary 47 pts to > 55 pts in order to demo improved pelvic floor function    ( 06/15/21: no change urinary, 2 pt change improvement  with bowel function,    11/25/21: neg scores of change, need to read[place] because pt has reported improvements,   04/21/22: constipation 6 pt change, urinary -2 pt change,      07/08/22: constipation constipation 2pt,  urinary 0 pts) )      Time 10    Period Weeks    Status On-going    Target Date 12/24/2022     PT LONG TERM GOAL #5   Title Pt will demo decreased pelvic floor mm tightness and proper relaxation of mm to have less pain with intercourcse and gynecological exams    Time 10    Period Weeks    Status Achieved    Target Date 02/03/22      PT LONG TERM GOAL #6   Title Pt will demo no pelvic floor tightness/ tendernessacorss 2 sessions, demo proper quick contractions > 5 reps and long holds 3sec, 5 reps without cues ino rder to gain continence    Time 6    Period Weeks    Status Ongoing    Target Date 11/26/2022      PT LONG TERM GOAL #7   Title Pt will report dry pad with walking 0.5 miles   in order to be continence with walking routine    Time 10    Period Weeks    Status On-going    Target Date 12/24/2022        PT LONG TERM GOAL #8  Title Pt will perform fitness exercises with proper alignment and technique to minimize overactivity of pelvic floor and incontinence   Time 10   Period Weeks   Status On-going   Target Date 12/24/2022          Plan -     Clinical Impression Statement Advanced pt to upright closed kinetic chain fitness exercises to enhance walking for more coactivation of deep core and proper feet mechanics to promote less relapse of prolapse and incontinence. Pt required cues for  proprioception of feet , knees, and less lumbar lordosis of spine. Pt demo'd correctly after training.   Provided HIIT workout with education on checking HR 60% max for optimal fitness in addition  to her endurance workout with walking.   Anticipate pt will achieve remaining goals with  continued  skilled PT .      Examination-Activity Limitations Toileting;Continence;Other    Stability/Clinical Decision Making Evolving/Moderate complexity    Rehab Potential Good    PT Frequency 1x / week    PT Duration Other (comment)   10   PT Treatment/Interventions Neuromuscular re-education;Cryotherapy;ADLs/Self Care Home Management;Therapeutic activities;Functional mobility training;Traction;Moist Heat;Stair training;Gait training;Patient/family education;Taping;Manual techniques;Scar mobilization;Balance training;Therapeutic exercise;Spinal Manipulations;Joint Manipulations;Energy conservation    Consulted and Agree with Plan of Care Patient               Jerl Mina, PT 10/26/2022, 11:15 AM

## 2022-10-26 NOTE — Patient Instructions (Addendum)
   WALKING WITH RESISTANCE BLUE Band at waist connected to doorknob 35mns Stepping forward normal length steps, planting mid and forefoot down, center of mass ( navel) leans forward slightly as if you were walking uphill 3-4 steps till band feels taut ( MAKE SURE THE DOOR IS LOCKED AND WON'T OPEN)   Stepping backwards, lower heel slowly, carry trunk and hips back , leaning forward, front knee along 2-3 rd toe line  Gaze down a few feet ahead    2 min  With 1 min rest break  Repeat 3 x at one time   1 x day  ____   Wrist for pulse   104-125 bpm  __for high intensity    SKI against chair   In front of chair, knee against seat to line knee above ankle,  back foot hip width apart, push off through ballmounds,  opp arm up, thumbs up, chin tuck, shoulders down  Push off through ballmounds , step back to under hip width    2 min  With 1 min rest break  Repeat 3 x at one time   1 x day   2 min

## 2022-10-28 ENCOUNTER — Encounter (INDEPENDENT_AMBULATORY_CARE_PROVIDER_SITE_OTHER): Payer: Self-pay | Admitting: Family Medicine

## 2022-10-28 ENCOUNTER — Ambulatory Visit (INDEPENDENT_AMBULATORY_CARE_PROVIDER_SITE_OTHER): Payer: BC Managed Care – PPO | Admitting: Family Medicine

## 2022-10-28 VITALS — BP 118/74 | HR 76 | Temp 97.6°F | Ht 62.0 in | Wt 212.0 lb

## 2022-10-28 DIAGNOSIS — G4709 Other insomnia: Secondary | ICD-10-CM

## 2022-10-28 DIAGNOSIS — E559 Vitamin D deficiency, unspecified: Secondary | ICD-10-CM | POA: Diagnosis not present

## 2022-10-28 DIAGNOSIS — R7303 Prediabetes: Secondary | ICD-10-CM

## 2022-10-28 DIAGNOSIS — R0602 Shortness of breath: Secondary | ICD-10-CM | POA: Diagnosis not present

## 2022-10-28 DIAGNOSIS — E669 Obesity, unspecified: Secondary | ICD-10-CM

## 2022-10-28 DIAGNOSIS — K5909 Other constipation: Secondary | ICD-10-CM

## 2022-10-28 DIAGNOSIS — Z6838 Body mass index (BMI) 38.0-38.9, adult: Secondary | ICD-10-CM

## 2022-10-28 MED ORDER — LINACLOTIDE 145 MCG PO CAPS: ORAL_CAPSULE | ORAL | 0 refills | Status: DC

## 2022-10-28 MED ORDER — METFORMIN HCL 500 MG PO TABS: 500.0000 mg | ORAL_TABLET | Freq: Two times a day (BID) | ORAL | 0 refills | Status: DC

## 2022-10-28 MED ORDER — SEMAGLUTIDE (2 MG/DOSE) 8 MG/3ML ~~LOC~~ SOPN: 2.0000 mg | PEN_INJECTOR | SUBCUTANEOUS | 0 refills | Status: DC

## 2022-10-28 MED ORDER — VITAMIN D (ERGOCALCIFEROL) 1.25 MG (50000 UNIT) PO CAPS: ORAL_CAPSULE | ORAL | 0 refills | Status: DC

## 2022-11-01 ENCOUNTER — Ambulatory Visit: Payer: BC Managed Care – PPO | Admitting: Physical Therapy

## 2022-11-05 ENCOUNTER — Encounter: Admitting: Physical Therapy

## 2022-11-08 ENCOUNTER — Telehealth (INDEPENDENT_AMBULATORY_CARE_PROVIDER_SITE_OTHER): Payer: BC Managed Care – PPO | Admitting: Family Medicine

## 2022-11-08 ENCOUNTER — Encounter: Payer: Self-pay | Admitting: Family Medicine

## 2022-11-08 VITALS — BP 133/81 | HR 71 | Temp 99.2°F | Ht 62.0 in | Wt 212.0 lb

## 2022-11-08 DIAGNOSIS — U071 COVID-19: Secondary | ICD-10-CM

## 2022-11-08 DIAGNOSIS — I10 Essential (primary) hypertension: Secondary | ICD-10-CM | POA: Diagnosis not present

## 2022-11-08 MED ORDER — NIRMATRELVIR/RITONAVIR (PAXLOVID)TABLET: 3.0000 | ORAL_TABLET | Freq: Two times a day (BID) | ORAL | 0 refills | Status: AC

## 2022-11-08 MED ORDER — TRIAMTERENE-HCTZ 37.5-25 MG PO TABS: 1.0000 | ORAL_TABLET | Freq: Every day | ORAL | 0 refills | Status: DC

## 2022-11-08 MED ORDER — DEXAMETHASONE 6 MG PO TABS: 6.0000 mg | ORAL_TABLET | Freq: Every day | ORAL | 0 refills | Status: AC

## 2022-11-08 NOTE — Progress Notes (Signed)
Virtual Visit via Video Note  I connected with Molly Bean on 11/08/22 at  1:00 PM EST by a video enabled telemedicine application and verified that I am speaking with the correct person using two identifiers.  Patient Location: Home Provider Location: Office/Clinic  I discussed the limitations, risks, security, and privacy concerns of performing an evaluation and management service by video and the availability of in person appointments. I also discussed with the patient that there may be a patient responsible charge related to this service. The patient expressed understanding and agreed to proceed.  Subjective: PCP: Janith Lima, MD  Chief Complaint  Patient presents with   Covid Positive    Took test fri and yest - both pos.  S/s: facial pressure, cough and yellow congestion - productive, had fever - at 99 this morning    Patient complains of cough, head/chest congestion, headache, runny nose, sneezing, facial pain/pressure, fever, postnasal drainage, shortness of breath, and wheezing. Onset of symptoms was 5 days ago, unchanged since that time. She is drinking plenty of fluids. Evaluation to date: at home COVID test positive x2. Treatment to date: Sinus severe, DayQuil, TheraFlu, and her albuterol inhaler which she required 3 times yesterday. She has a history of asthma. She does not smoke.      ROS: Per HPI  Current Outpatient Medications:    acetaminophen (TYLENOL) 500 MG tablet, Take 1,000 mg by mouth every 6 (six) hours as needed for mild pain or headache., Disp: , Rfl:    albuterol (VENTOLIN HFA) 108 (90 Base) MCG/ACT inhaler, TAKE 2 PUFFS BY MOUTH EVERY 6 HOURS AS NEEDED FOR WHEEZE OR SHORTNESS OF BREATH, Disp: 8.5 g, Rfl: 0   buPROPion (WELLBUTRIN SR) 150 MG 12 hr tablet, Take 1 tablet (150 mg total) by mouth in the morning., Disp: 30 tablet, Rfl: 0   dexamethasone (DECADRON) 6 MG tablet, Take 1 tablet (6 mg total) by mouth daily for 5 days., Disp: 5 tablet, Rfl:  0   diazepam (VALIUM) 5 MG tablet, Place 1 tablet vaginally nightly as needed for muscle spasm/ pelvic pain., Disp: 40 tablet, Rfl: 0   estradiol (ESTRACE) 0.1 MG/GM vaginal cream, Place 0.5g nightly for two weeks then twice a week after, Disp: 30 g, Rfl: 11   Ferrous Sulfate (IRON) 325 (65 Fe) MG TABS, Take 1 tablet (325 mg total) by mouth daily at 2 PM., Disp: 30 tablet, Rfl: 0   lidocaine-prilocaine (EMLA) cream, Apply 1 application. topically as needed., Disp: 30 g, Rfl: 5   linaclotide (LINZESS) 145 MCG CAPS capsule, 2 po qd, Disp: 60 capsule, Rfl: 0   metFORMIN (GLUCOPHAGE) 500 MG tablet, Take 1 tablet (500 mg total) by mouth 2 (two) times daily with a meal., Disp: 60 tablet, Rfl: 0   mirabegron ER (MYRBETRIQ) 25 MG TB24 tablet, Take 1 tablet (25 mg total) by mouth daily., Disp: 30 tablet, Rfl: 5   nirmatrelvir/ritonavir (PAXLOVID) 20 x 150 MG & 10 x '100MG'$  TABS, Take 3 tablets by mouth 2 (two) times daily for 5 days. (Take nirmatrelvir 150 mg two tablets twice daily for 5 days and ritonavir 100 mg one tablet twice daily for 5 days) Patient GFR is 83, Disp: 30 tablet, Rfl: 0   rosuvastatin (CRESTOR) 10 MG tablet, TAKE 1 TABLET BY MOUTH EVERYDAY AT BEDTIME, Disp: 30 tablet, Rfl: 0   Semaglutide, 2 MG/DOSE, 8 MG/3ML SOPN, Inject 2 mg as directed once a week., Disp: 3 mL, Rfl: 0   Vitamin D, Ergocalciferol, (  DRISDOL) 1.25 MG (50000 UNIT) CAPS capsule, 1 po q wed, and 1 po q sun, Disp: 8 capsule, Rfl: 0   triamterene-hydrochlorothiazide (MAXZIDE-25) 37.5-25 MG tablet, Take 1 tablet by mouth daily., Disp: 30 tablet, Rfl: 0  Observations/Objective: Today's Vitals   11/08/22 0946  BP: 133/81  Pulse: 71  Temp: 99.2 F (37.3 C)  Weight: 212 lb (96.2 kg)  Height: '5\' 2"'$  (1.575 m)   Body mass index is 38.78 kg/m.  Physical Exam Constitutional:      General: She is not in acute distress.    Appearance: Normal appearance. She is not ill-appearing or toxic-appearing.  Eyes:     General: No  scleral icterus.       Right eye: No discharge.        Left eye: No discharge.     Conjunctiva/sclera: Conjunctivae normal.  Pulmonary:     Effort: Pulmonary effort is normal. No respiratory distress.  Neurological:     Mental Status: She is alert and oriented to person, place, and time.  Psychiatric:        Mood and Affect: Mood normal.        Behavior: Behavior normal.        Thought Content: Thought content normal.        Judgment: Judgment normal.     Assessment and Plan: 1. COVID-19 Discussed symptom management and s/s that should prompt her to go to the ER.  Education provided on COVID-19. - nirmatrelvir/ritonavir (PAXLOVID) 20 x 150 MG & 10 x '100MG'$  TABS; Take 3 tablets by mouth 2 (two) times daily for 5 days. (Take nirmatrelvir 150 mg two tablets twice daily for 5 days and ritonavir 100 mg one tablet twice daily for 5 days) Patient GFR is 83  Dispense: 30 tablet; Refill: 0 - dexamethasone (DECADRON) 6 MG tablet; Take 1 tablet (6 mg total) by mouth daily for 5 days.  Dispense: 5 tablet; Refill: 0  2. Essential hypertension Well controlled on current regimen.  Patient is about to be out of her medication.  I did send in a 30-day refill but advised that she needs to schedule an appointment with her PCP as soon as possible as she has not seen him in over a year. - triamterene-hydrochlorothiazide (MAXZIDE-25) 37.5-25 MG tablet; Take 1 tablet by mouth daily.  Dispense: 30 tablet; Refill: 0    Follow Up Instructions: Return for Chronic follow-up with PCP ASAP.   I discussed the assessment and treatment plan with the patient. The patient was provided an opportunity to ask questions, and all were answered. The patient agreed with the plan and demonstrated an understanding of the instructions.   The patient was advised to call back or seek an in-person evaluation if the symptoms worsen or if the condition fails to improve as anticipated.  The above assessment and management plan was  discussed with the patient. The patient verbalized understanding of and has agreed to the management plan.   Loman Brooklyn, FNP

## 2022-11-09 ENCOUNTER — Encounter: Admitting: Physical Therapy

## 2022-11-10 NOTE — Progress Notes (Signed)
Chief Complaint:   OBESITY Molly Bean is here to discuss her progress with her obesity treatment plan along with follow-up of her obesity related diagnoses. Molly Bean is on the Category 1 Plan and states she is following her eating plan approximately 90% of the time. Molly Bean states she is walking and doing pelvic physical therapy for 105 minutes 6 times per week.  Today's visit was #: 32 Starting weight: 240 lbs Starting date: 04/14/2020 Today's weight: 212 lbs Today's date: 10/28/2022 Total lbs lost to date: 28 Total lbs lost since last in-office visit: 0  Interim History: Molly Bean has been off track over the holidays. She is working on getting back on track with her eating plan, and she is doing very well with her exercise.   Subjective:   1. Pre-diabetes Molly Bean is on Ozempic, but she has been out for 2-3 weeks due to finances. She is able to restart now.   2. Other insomnia Molly Bean notes difficulty falling asleep and staying asleep.  She has obstructive sleep apnea and she uses her CPAP, but she has not been evaluated recently.  3. SOBOE (shortness of breath on exertion) Molly Bean notes improvement and shortness of breath with exertion.  This is likely due to weight loss and increased exercise.  Her RMR has improved from last test and 2021, but it is not yet at goal.  She has a history of anemia and poor sleep.  4. Vitamin D deficiency Molly Bean is on vitamin D with no side effects noted.  She requests a refill today.  5. Other constipation Molly Bean is on Linzess, and her BM is not daily but are not hard or painful.  Assessment/Plan:   1. Pre-diabetes Samaia will continue her medications, and we will refill metformin and Ozempic for 1 month.  - metFORMIN (GLUCOPHAGE) 500 MG tablet; Take 1 tablet (500 mg total) by mouth 2 (two) times daily with a meal.  Dispense: 60 tablet; Refill: 0  2. Other insomnia Molly Bean is to see her sleep doctor to discuss treatment  options.  3. SOBOE (shortness of breath on exertion) IC was repeated today. Molly Bean is to continue to work on iron rich foods and improve sleep, and will continue exercise as is.  4. Vitamin D deficiency Molly Bean will continue prescription vitamin D, and we will refill for 1 month.  - Vitamin D, Ergocalciferol, (DRISDOL) 1.25 MG (50000 UNIT) CAPS capsule; 1 po q wed, and 1 po q sun  Dispense: 8 capsule; Refill: 0  5. Other constipation Molly Bean will continue Linzess, and we will refill for 1 month.  - linaclotide (LINZESS) 145 MCG CAPS capsule; 2 po qd  Dispense: 60 capsule; Refill: 0  6. BMI 38.0-38.9,adult  - Semaglutide, 2 MG/DOSE, 8 MG/3ML SOPN; Inject 2 mg as directed once a week.  Dispense: 3 mL; Refill: 0  7. Obesity, Beginning BMI 43.90 We will refill Ozempic for 1 month.   - Semaglutide, 2 MG/DOSE, 8 MG/3ML SOPN; Inject 2 mg as directed once a week.  Dispense: 3 mL; Refill: 0  Molly Bean is currently in the action stage of change. As such, her goal is to continue with weight loss efforts. She has agreed to the Category 1 Plan.   Exercise goals: As is.   Behavioral modification strategies: no skipping meals and meal planning and cooking strategies.  Molly Bean has agreed to follow-up with our clinic in 3 weeks with Dr. Raliegh Scarlet. She was informed of the importance of frequent follow-up visits to maximize her success with  intensive lifestyle modifications for her multiple health conditions.   Objective:   Blood pressure 118/74, pulse 76, temperature 97.6 F (36.4 C), height 5' 2"$  (1.575 m), weight 212 lb (96.2 kg), SpO2 96 %. Body mass index is 38.78 kg/m.  General: Cooperative, alert, well developed, in no acute distress. HEENT: Conjunctivae and lids unremarkable. Cardiovascular: Regular rhythm.  Lungs: Normal work of breathing. Neurologic: No focal deficits.   Lab Results  Component Value Date   CREATININE 0.80 03/30/2022   BUN 11 03/30/2022   NA 145 (H)  03/30/2022   K 4.3 03/30/2022   CL 105 03/30/2022   CO2 25 03/30/2022   Lab Results  Component Value Date   ALT 13 03/30/2022   AST 18 03/30/2022   ALKPHOS 74 03/30/2022   BILITOT 0.3 03/30/2022   Lab Results  Component Value Date   HGBA1C 5.7 (H) 03/30/2022   HGBA1C 5.7 (H) 11/03/2021   HGBA1C 5.9 (H) 07/01/2021   HGBA1C 6.0 (H) 03/23/2021   HGBA1C 6.2 (H) 12/30/2020   Lab Results  Component Value Date   INSULIN 9.1 03/30/2022   INSULIN 22.5 11/03/2021   INSULIN 21.1 03/23/2021   INSULIN 21.9 04/15/2020   Lab Results  Component Value Date   TSH 1.100 04/15/2020   Lab Results  Component Value Date   CHOL 170 11/03/2021   HDL 62 11/03/2021   LDLCALC 97 11/03/2021   LDLDIRECT 95 12/30/2020   TRIG 58 11/03/2021   CHOLHDL 2.9 07/01/2021   Lab Results  Component Value Date   VD25OH 57.3 03/30/2022   VD25OH 30.5 11/03/2021   VD25OH 47.7 05/20/2021   Lab Results  Component Value Date   WBC 5.3 03/30/2022   HGB 12.5 03/30/2022   HCT 39.5 03/30/2022   MCV 68 (L) 03/30/2022   PLT 236 03/30/2022   Lab Results  Component Value Date   IRON 63 11/03/2021   TIBC 332 11/03/2021   FERRITIN 72 11/03/2021   Attestation Statements:   Reviewed by clinician on day of visit: allergies, medications, problem list, medical history, surgical history, family history, social history, and previous encounter notes.  Time spent on visit including pre-visit chart review and post-visit care and charting was 40 minutes.   I, Trixie Dredge, am acting as transcriptionist for Dennard Nip, MD.  I have reviewed the above documentation for accuracy and completeness, and I agree with the above. -  Dennard Nip, MD

## 2022-11-16 ENCOUNTER — Ambulatory Visit: Admitting: Internal Medicine

## 2022-11-22 ENCOUNTER — Ambulatory Visit (INDEPENDENT_AMBULATORY_CARE_PROVIDER_SITE_OTHER): Payer: BC Managed Care – PPO | Admitting: Family Medicine

## 2022-11-22 ENCOUNTER — Encounter (INDEPENDENT_AMBULATORY_CARE_PROVIDER_SITE_OTHER): Payer: Self-pay | Admitting: Family Medicine

## 2022-11-22 VITALS — BP 123/71 | HR 63 | Temp 98.5°F | Ht 62.0 in | Wt 216.0 lb

## 2022-11-22 DIAGNOSIS — R7303 Prediabetes: Secondary | ICD-10-CM | POA: Diagnosis not present

## 2022-11-22 DIAGNOSIS — K5909 Other constipation: Secondary | ICD-10-CM | POA: Diagnosis not present

## 2022-11-22 DIAGNOSIS — Z6838 Body mass index (BMI) 38.0-38.9, adult: Secondary | ICD-10-CM

## 2022-11-22 DIAGNOSIS — I10 Essential (primary) hypertension: Secondary | ICD-10-CM

## 2022-11-22 DIAGNOSIS — E559 Vitamin D deficiency, unspecified: Secondary | ICD-10-CM | POA: Diagnosis not present

## 2022-11-22 DIAGNOSIS — E7849 Other hyperlipidemia: Secondary | ICD-10-CM

## 2022-11-22 DIAGNOSIS — E669 Obesity, unspecified: Secondary | ICD-10-CM

## 2022-11-22 DIAGNOSIS — Z6839 Body mass index (BMI) 39.0-39.9, adult: Secondary | ICD-10-CM

## 2022-11-22 LAB — HM MAMMOGRAPHY

## 2022-11-22 MED ORDER — METFORMIN HCL 500 MG PO TABS: 500.0000 mg | ORAL_TABLET | Freq: Two times a day (BID) | ORAL | 0 refills | Status: DC

## 2022-11-22 MED ORDER — VITAMIN D (ERGOCALCIFEROL) 1.25 MG (50000 UNIT) PO CAPS: ORAL_CAPSULE | ORAL | 0 refills | Status: DC

## 2022-11-22 MED ORDER — LINACLOTIDE 145 MCG PO CAPS: ORAL_CAPSULE | ORAL | 0 refills | Status: DC

## 2022-11-22 MED ORDER — SEMAGLUTIDE (2 MG/DOSE) 8 MG/3ML ~~LOC~~ SOPN: 2.0000 mg | PEN_INJECTOR | SUBCUTANEOUS | 0 refills | Status: DC

## 2022-11-23 LAB — COMPREHENSIVE METABOLIC PANEL
ALT: 17 IU/L (ref 0–32)
AST: 14 IU/L (ref 0–40)
Albumin/Globulin Ratio: 1.6 (ref 1.2–2.2)
Albumin: 3.7 g/dL — ABNORMAL LOW (ref 3.9–4.9)
Alkaline Phosphatase: 76 IU/L (ref 44–121)
BUN/Creatinine Ratio: 10 — ABNORMAL LOW (ref 12–28)
BUN: 8 mg/dL (ref 8–27)
Bilirubin Total: <0.2 mg/dL (ref 0.0–1.2)
CO2: 23 mmol/L (ref 20–29)
Calcium: 8.7 mg/dL (ref 8.7–10.3)
Chloride: 104 mmol/L (ref 96–106)
Creatinine, Ser: 0.83 mg/dL (ref 0.57–1.00)
Globulin, Total: 2.3 g/dL (ref 1.5–4.5)
Glucose: 74 mg/dL (ref 70–99)
Potassium: 4.1 mmol/L (ref 3.5–5.2)
Sodium: 141 mmol/L (ref 134–144)
Total Protein: 6 g/dL (ref 6.0–8.5)
eGFR: 79 mL/min/{1.73_m2} (ref >59)

## 2022-11-23 LAB — LIPID PANEL
Chol/HDL Ratio: 2.7 ratio (ref 0.0–4.4)
Cholesterol, Total: 171 mg/dL (ref 100–199)
HDL: 64 mg/dL (ref >39)
LDL Chol Calc (NIH): 92 mg/dL (ref 0–99)
Triglycerides: 78 mg/dL (ref 0–149)
VLDL Cholesterol Cal: 15 mg/dL (ref 5–40)

## 2022-11-23 LAB — LDL CHOLESTEROL, DIRECT: LDL Direct: 96 mg/dL (ref 0–99)

## 2022-11-23 LAB — HEMOGLOBIN A1C
Est. average glucose Bld gHb Est-mCnc: 128 mg/dL
Hgb A1c MFr Bld: 6.1 % — ABNORMAL HIGH (ref 4.8–5.6)

## 2022-11-23 LAB — VITAMIN D 25 HYDROXY (VIT D DEFICIENCY, FRACTURES): Vit D, 25-Hydroxy: 43.7 ng/mL (ref 30.0–100.0)

## 2022-11-25 ENCOUNTER — Encounter: Admitting: Physical Therapy

## 2022-11-25 DIAGNOSIS — Z6839 Body mass index (BMI) 39.0-39.9, adult: Secondary | ICD-10-CM | POA: Insufficient documentation

## 2022-11-30 ENCOUNTER — Encounter: Admitting: Physical Therapy

## 2022-11-30 ENCOUNTER — Ambulatory Visit: Payer: BC Managed Care – PPO | Attending: Obstetrics and Gynecology | Admitting: Physical Therapy

## 2022-11-30 DIAGNOSIS — R2689 Other abnormalities of gait and mobility: Secondary | ICD-10-CM | POA: Diagnosis present

## 2022-11-30 DIAGNOSIS — R278 Other lack of coordination: Secondary | ICD-10-CM

## 2022-11-30 DIAGNOSIS — N393 Stress incontinence (female) (male): Secondary | ICD-10-CM | POA: Diagnosis present

## 2022-11-30 NOTE — Therapy (Unsigned)
OUTPATIENT PHYSICAL THERAPY TREATMENT NOTE / Progress Note across visit 310 visit from  07/08/22 to 11/30/22     Patient Name: Molly Bean MRN: [id number] DOB:[birth year], 62 y.o., female Today's Date: 11/30/2022  REFERRING PROVIDER: Wannetta Sender   PT End of Session - 11/30/22 0812     Visit Number 40    Date for PT Re-Evaluation [id number]   PN & recert [id number], [id number], 07/08/22   PT Start Time 0802    PT Stop Time 0845    PT Time Calculation (min) 43 min    Activity Tolerance Patient tolerated treatment well    Behavior During Therapy WFL for tasks assessed/performed             Past Medical History:  Diagnosis Date   Anxiety    Constipation    DEPRESSION    edema lower extremeties    GERD    Joint pain    MENOPAUSAL SYNDROME    Multiple food allergies    OBESITY    Shortness of breath    Sleep apnea    wears CPAP   Past Surgical History:  Procedure Laterality Date   ABDOMINAL HYSTERECTOMY  11/1998   total fibroids   CESAREAN SECTION     Patient Active Problem List   Diagnosis Date Noted   BMI 39.0-39.9,adult-current bmi 39.5 11/25/2022   Morbid obesity (HCC)-start bmi 43.90 11/25/2022   Other insomnia 10/28/2022   BMI 38.0-38.9,adult 10/28/2022   Otitis externa 10/19/2022   At risk for dehydration 03/31/2022   Axillary hidradenitis suppurativa 07/07/2021   Proteus mirabilis infection 01/16/2021   Cutaneous abscess of right axilla 01/13/2021   Iron deficiency anemia 12/10/2020   At risk for side effect of medication 12/10/2020   Other constipation 08/19/2020   Prediabetes 08/06/2020   Chronic constipation 08/06/2020   Other hyperlipidemia 08/06/2020   Vitamin D deficiency 08/06/2020   Vaginitis 04/09/2020   Essential hypertension 05/01/2018   SOBOE (shortness of breath on exertion) 03/24/2018   Asthma 03/14/2018   Hidradenitis suppurativa 01/18/2018   GAD (generalized anxiety disorder) 10/11/2016   MENOPAUSAL SYNDROME 04/23/2009   Generalized  obesity 02/15/2008   Mood disorder ([place]) 04/12/2007   Gastroesophageal reflux disease 04/12/2007    REFERRING DIAG: R32 (ICD-10-CM) - Urinary incontinence, unspecified type  THERAPY DIAG:  Stress incontinence of urine   Other abnormalities of gait and mobility   Other lack of coordination    Rationale for Evaluation and Treatment Rehabilitation  PERTINENT HISTORY: C-section, abdominal hysterectomy   PRECAUTIONS: N/A  SUBJECTIVE:                Pt reports she gained 6 lbs and doesn't like that   PAIN:  Are you having pain? No          PATIENT EDUCATION: Education details: withhold long contractions, focus on not letting abdominal mm overuse    Person educated: Patient Education method: Explanation, Demonstration, Tactile cues, Verbal cues, and Handouts Education comprehension: verbalized understanding, returned demonstration, verbal cues required, tactile cues required, and needs further education   HOME EXERCISE PROGRAM:                             PT Long Term Goals -       PT LONG TERM GOAL #1   Title Pt will demo increased hip abduction strength from 3/5 to >4/5 in order to maintain walking routine and pelvic stability    Time 8  Period Weeks    Status Achieved    Target Date 02/03/22      PT LONG TERM GOAL #2   Title Pt will demo proper deepcore coordination and pelvic floor movement with no cues to progress to kegel exercises    Time 4    Period Weeks    Status Achieved    Target Date 02/03/22      PT LONG TERM GOAL #3   Title Pt will demo decreased abdominal scar restrictions to advance to deep core exercises and promote IAP for continence and bowel movement elimination with less dependence on Linzess    Time 4    Period Weeks    Status Achieved    Target Date 02/03/22      PT LONG TERM GOAL #4   Title Pt will increase FOTO score for constipation from 41 pts and Urinary 47 pts to > 55 pts in order to demo improved pelvic floor  function    ( 06/15/21: no change urinary, 2 pt change improvement  with bowel function,    11/25/21: neg scores of change, need to read[place] because pt has reported improvements,   04/21/22: constipation 6 pt change, urinary -2 pt change,      07/08/22: constipation constipation 2pt,  urinary 0 pts) )      Time 10    Period Weeks    Status On-going    Target Date 12/24/2022     PT LONG TERM GOAL #5   Title Pt will demo decreased pelvic floor mm tightness and proper relaxation of mm to have less pain with intercourcse and gynecological exams    Time 10    Period Weeks    Status Achieved    Target Date 02/03/22      PT LONG TERM GOAL #6   Title Pt will demo no pelvic floor tightness/ tendernessacorss 2 sessions, demo proper quick contractions > 5 reps and long holds 3sec, 5 reps without cues ino rder to gain continence    Time 6    Period Weeks    Status Ongoing    Target Date 11/26/2022      PT LONG TERM GOAL #7   Title Pt will report dry pad with walking 0.5 miles   in order to be continence with walking routine    Time 10    Period Weeks    Status On-going    Target Date 12/24/2022        PT LONG TERM GOAL #8  Title Pt will perform fitness exercises with proper alignment and technique to minimize overactivity of pelvic floor and incontinence   Time 10   Period Weeks   Status On-going   Target Date 12/24/2022          Plan -     Clinical Impression Statement Advanced pt to upright closed kinetic chain fitness exercises to enhance walking for more coactivation of deep core and proper feet mechanics to promote less relapse of prolapse and incontinence. Pt required cues for proprioception of feet , knees, and less lumbar lordosis of spine. Pt demo'd correctly after training.   Provided HIIT workout with education on checking HR 60% max for optimal fitness in addition to her endurance workout with walking.   Anticipate pt will achieve remaining goals with  continued   skilled PT .      Examination-Activity Limitations Toileting;Continence;Other    Stability/Clinical Decision Making Evolving/Moderate complexity    Rehab Potential Good    PT  Frequency 1x / week    PT Duration Other (comment)   10   PT Treatment/Interventions Neuromuscular re-education;Cryotherapy;ADLs/Self Care Home Management;Therapeutic activities;Functional mobility training;Traction;Moist Heat;Stair training;Gait training;Patient/family education;Taping;Manual techniques;Scar mobilization;Balance training;Therapeutic exercise;Spinal Manipulations;Joint Manipulations;Energy conservation    Consulted and Agree with Plan of Care Patient               Jerl Mina, PT 11/30/2022, 8:13 AM

## 2022-11-30 NOTE — Patient Instructions (Addendum)
relaxation to minimzie straining abs  On back , knees bent , feet and back connected, toes up  3 sec (count aloud), 6 reps  deep core level 1-2   3 sec (count aloud), 6 reps again   ___   , seated quicked contractions at meal times  ___    Practice proper pelvic floor coordination  Inhale: expand pelvic floor muscles Exhale" "j" scoop, allow pelvic floor to close, lift first before belly sinks , hand on low belly sinks first, other hand on upper belly sinks last    ( not "draw abdominal muscle to spine" or strain with abdominal muscles")   __   Sit with knees aligned with 2-3rd toes, not turned in which is poor alignment and tightens adductor and pelvic floor

## 2022-12-01 ENCOUNTER — Ambulatory Visit: Admitting: Internal Medicine

## 2022-12-02 NOTE — Progress Notes (Signed)
Chief Complaint:   OBESITY Molly Bean is here to discuss her progress with her obesity treatment plan along with follow-up of her obesity related diagnoses. Molly Bean is on the Category 1 Plan and states she is following her eating plan approximately 80% of the time. Molly Bean states she is walking, workout 30-60 minutes 6 times per week.  Today's visit was #: 80 Starting weight: 240 lbs Starting date: 04/14/2020 Today's weight: 216 lbs Today's date: 11/22/2022 Total lbs lost to date: 24 lbs Total lbs lost since last in-office visit: +4 lbs  Interim History: Patient has flu symptoms for 2 weeks and has not eaten as much during that time.  Patient had an IC done last office visit, but no labs drawn.   Subjective:   1. Essential hypertension Patient has been off her Maxide blood pressure medications for 2 months refill.  Blood pressure at home 120/70 right around where.  Patient is asymptomatic.  Doing well minor lower extremity edema.  2. Pre-diabetes Patient is on Ozempic, metformin and tolerating well.  Patient denies side effects.  Patient denies hunger or cravings.  No GI side effects.  3. Other constipation Patient is on Linzess 2 daily.  Symptoms well-controlled.  Patient is taking 7 bottles of water per day.  4. Vitamin D deficiency Molly Bean is tolerating medication(s) well without side effects.  Medication compliance is good as patient endorses taking it as prescribed.  Symptoms are stable and the patient denies additional concerns regarding this condition.    5. Other hyperlipidemia Patient is taking Crestor 2 days/week along with vitamin D.  No side effects and tolerating well.  He went I  Assessment/Plan:   Orders Placed This Encounter  Procedures   VITAMIN D 25 Hydroxy (Vit-D Deficiency, Fractures)   Lipid panel   LDL cholesterol, direct   Hemoglobin A1c   Comprehensive metabolic panel    Medications Discontinued During This Encounter  Medication  Reason   buPROPion (WELLBUTRIN SR) 150 MG 12 hr tablet Side effect (s)   linaclotide (LINZESS) 145 MCG CAPS capsule Reorder   metFORMIN (GLUCOPHAGE) 500 MG tablet Reorder   Vitamin D, Ergocalciferol, (DRISDOL) 1.25 MG (50000 UNIT) CAPS capsule Reorder   Semaglutide, 2 MG/DOSE, 8 MG/3ML SOPN Reorder     Meds ordered this encounter  Medications   Semaglutide, 2 MG/DOSE, 8 MG/3ML SOPN    Sig: Inject 2 mg as directed once a week.    Dispense:  3 mL    Refill:  0   metFORMIN (GLUCOPHAGE) 500 MG tablet    Sig: Take 1 tablet (500 mg total) by mouth 2 (two) times daily with a meal.    Dispense:  60 tablet    Refill:  0    Ov for rf   linaclotide (LINZESS) 145 MCG CAPS capsule    Sig: 2 po qd    Dispense:  60 capsule    Refill:  0   Vitamin D, Ergocalciferol, (DRISDOL) 1.25 MG (50000 UNIT) CAPS capsule    Sig: 1 po q wed, and 1 po q sun    Dispense:  8 capsule    Refill:  0    Ov for rf     1. Essential hypertension Continue all blood pressure medications until seen by PCP.  Continue PNP and weight loss.  Check CMP, decrease salt, increase water, continue exercise, increase activity as tolerated.  Check labs today.  - Comprehensive metabolic panel  2. Pre-diabetes Continue PNP and decrease simple sugars.  Check labs today.  Refill- Semaglutide, 2 MG/DOSE, 8 MG/3ML SOPN; Inject 2 mg as directed once a week.  Dispense: 3 mL; Refill: 0  Refill- metFORMIN (GLUCOPHAGE) 500 MG tablet; Take 1 tablet (500 mg total) by mouth 2 (two) times daily with a meal.  Dispense: 60 tablet; Refill: 0  - Hemoglobin A1c  3. Other constipation Check labs today.  Refill- linaclotide (LINZESS) 145 MCG CAPS capsule; 2 po qd  Dispense: 60 capsule; Refill: 0  4. Vitamin D deficiency Molly Bean is tolerating medication(s) well without side effects.  Medication compliance is good as patient endorses taking it as prescribed.  Symptoms are stable and the patient denies additional concerns regarding  this condition.  Check labs today.   Refill - Vitamin D, Ergocalciferol, (DRISDOL) 1.25 MG (50000 UNIT) CAPS capsule; 1 po q wed, and 1 po q sun  Dispense: 8 capsule; Refill: 0  - VITAMIN D 25 Hydroxy (Vit-D Deficiency, Fractures)  5. Other hyperlipidemia Check labs today. - Lipid panel - LDL cholesterol, direct  6. BMI 39.0-39.9,adult-current bmi 39.5   7. Morbid obesity (HCC)-start bmi 43.90 Check labs today.  Molly Bean is currently in the action stage of change. As such, her goal is to continue with weight loss efforts. She has agreed to the Category 1 Plan breakfast and lunch options.   Exercise goals:  As is, but increase activity as tolerated.  Behavioral modification strategies: increasing lean protein intake, decreasing simple carbohydrates, and planning for success.  Molly Bean has agreed to follow-up with our clinic in 3 weeks. She was informed of the importance of frequent follow-up visits to maximize her success with intensive lifestyle modifications for her multiple health conditions.   Objective:   Blood pressure 123/71, pulse 63, temperature 98.5 F (36.9 C), height '5\' 2"'$  (1.575 m), weight 216 lb (98 kg), SpO2 100 %. Body mass index is 39.51 kg/m.  General: Cooperative, alert, well developed, in no acute distress. HEENT: Conjunctivae and lids unremarkable. Cardiovascular: Regular rhythm.  Lungs: Normal work of breathing. Neurologic: No focal deficits.   Lab Results  Component Value Date   CREATININE 0.83 11/22/2022   BUN 8 11/22/2022   NA 141 11/22/2022   K 4.1 11/22/2022   CL 104 11/22/2022   CO2 23 11/22/2022   Lab Results  Component Value Date   ALT 17 11/22/2022   AST 14 11/22/2022   ALKPHOS 76 11/22/2022   BILITOT <0.2 11/22/2022   Lab Results  Component Value Date   HGBA1C 6.1 (H) 11/22/2022   HGBA1C 5.7 (H) 03/30/2022   HGBA1C 5.7 (H) 11/03/2021   HGBA1C 5.9 (H) 07/01/2021   HGBA1C 6.0 (H) 03/23/2021   Lab Results  Component Value Date    INSULIN 9.1 03/30/2022   INSULIN 22.5 11/03/2021   INSULIN 21.1 03/23/2021   INSULIN 21.9 04/15/2020   Lab Results  Component Value Date   TSH 1.100 04/15/2020   Lab Results  Component Value Date   CHOL 171 11/22/2022   HDL 64 11/22/2022   LDLCALC 92 11/22/2022   LDLDIRECT 96 11/22/2022   TRIG 78 11/22/2022   CHOLHDL 2.7 11/22/2022   Lab Results  Component Value Date   VD25OH 43.7 11/22/2022   VD25OH 57.3 03/30/2022   VD25OH 30.5 11/03/2021   Lab Results  Component Value Date   WBC 5.3 03/30/2022   HGB 12.5 03/30/2022   HCT 39.5 03/30/2022   MCV 68 (L) 03/30/2022   PLT 236 03/30/2022   Lab Results  Component Value Date  IRON 63 11/03/2021   TIBC 332 11/03/2021   FERRITIN 72 11/03/2021    Attestation Statements:   Reviewed by clinician on day of visit: allergies, medications, problem list, medical history, surgical history, family history, social history, and previous encounter notes.  I, Davy Pique, RMA, am acting as Location manager for [employer], DO.   I have reviewed the above documentation for accuracy and completeness, and I agree with the above. Marjory Sneddon, D.O.  The [place] was signed into law in 2016 which includes the topic of electronic health records.  This provides immediate access to information in MyChart.  This includes consultation notes, operative notes, office notes, lab results and pathology reports.  If you have any questions about what you read please let us know at your next visit so we can discuss your concerns and take corrective action if need be.  We are right here with you.

## 2022-12-07 ENCOUNTER — Encounter: Admitting: Physical Therapy

## 2022-12-14 ENCOUNTER — Encounter: Admitting: Physical Therapy

## 2022-12-16 ENCOUNTER — Encounter: Admitting: Physical Therapy

## 2022-12-19 ENCOUNTER — Other Ambulatory Visit (INDEPENDENT_AMBULATORY_CARE_PROVIDER_SITE_OTHER): Payer: Self-pay | Admitting: Family Medicine

## 2022-12-19 DIAGNOSIS — R7303 Prediabetes: Secondary | ICD-10-CM

## 2022-12-21 ENCOUNTER — Encounter: Admitting: Physical Therapy

## 2022-12-21 ENCOUNTER — Ambulatory Visit (INDEPENDENT_AMBULATORY_CARE_PROVIDER_SITE_OTHER): Payer: BC Managed Care – PPO | Admitting: Family Medicine

## 2022-12-28 ENCOUNTER — Ambulatory Visit: Payer: BC Managed Care – PPO | Attending: Obstetrics and Gynecology | Admitting: Physical Therapy

## 2022-12-28 DIAGNOSIS — R2689 Other abnormalities of gait and mobility: Secondary | ICD-10-CM | POA: Insufficient documentation

## 2022-12-28 DIAGNOSIS — N393 Stress incontinence (female) (male): Secondary | ICD-10-CM | POA: Insufficient documentation

## 2022-12-28 DIAGNOSIS — R278 Other lack of coordination: Secondary | ICD-10-CM | POA: Diagnosis present

## 2022-12-28 NOTE — Therapy (Signed)
OUTPATIENT PHYSICAL THERAPY TREATMENT NOTE/ recert     Patient Name: Molly Bean MRN: [id number] DOB:06-06-1961, 62 y.o., female Today's Date: 12/28/2022  REFERRING PROVIDER: Wannetta Sender   PT End of Session - 11/30/22 0812     Visit Number 40    Date for PT Re-Evaluation 0000000    PN & recert [id number], 0000000, 07/08/22, 12/01/22   PT Start Time 0802    PT Stop Time 0845    PT Time Calculation (min) 43 min    Activity Tolerance Patient tolerated treatment well    Behavior During Therapy [facility] for tasks assessed/performed             Past Medical History:  Diagnosis Date   Anxiety    Constipation    DEPRESSION    edema lower extremeties    GERD    Joint pain    MENOPAUSAL SYNDROME    Multiple food allergies    OBESITY    Shortness of breath    Sleep apnea    wears CPAP   Past Surgical History:  Procedure Laterality Date   ABDOMINAL HYSTERECTOMY  11/1998   total fibroids   CESAREAN SECTION     Patient Active Problem List   Diagnosis Date Noted   BMI 39.0-39.9,adult-current bmi 39.5 11/25/2022   Morbid obesity (HCC)-start bmi 43.90 11/25/2022   Other insomnia 10/28/2022   BMI 38.0-38.9,adult 10/28/2022   Otitis externa 10/19/2022   At risk for dehydration 03/31/2022   Axillary hidradenitis suppurativa 07/07/2021   Proteus mirabilis infection 01/16/2021   Cutaneous abscess of right axilla 01/13/2021   Iron deficiency anemia 12/10/2020   At risk for side effect of medication 12/10/2020   Other constipation 08/19/2020   Prediabetes 08/06/2020   Chronic constipation 08/06/2020   Other hyperlipidemia 08/06/2020   Vitamin D deficiency 08/06/2020   Vaginitis 04/09/2020   Essential hypertension 05/01/2018   SOBOE (shortness of breath on exertion) 03/24/2018   Asthma 03/14/2018   Hidradenitis suppurativa 01/18/2018   GAD (generalized anxiety disorder) 10/11/2016   MENOPAUSAL SYNDROME 04/23/2009   Generalized obesity 02/15/2008   Mood disorder ([place])  04/12/2007   Gastroesophageal reflux disease 04/12/2007    REFERRING DIAG: R32 (ICD-10-CM) - Urinary incontinence, unspecified type  THERAPY DIAG:  Stress incontinence of urine   Other abnormalities of gait and mobility   Other lack of coordination    Rationale for Evaluation and Treatment Rehabilitation  PERTINENT HISTORY: C-section, abdominal hysterectomy   PRECAUTIONS: N/A  SUBJECTIVE:                Pt reports her leakage has not worsened.   PAIN:  Are you having pain? No    [facility] PT Assessment - 12/28/22 0836       Coordination   Coordination and Movement Description poor proiopcetion of lower kinetic chain with backward stepping              [facility] Adult PT Treatment/Exercise - 12/28/22 0858       Neuro Re-ed    Neuro Re-ed Details  excessive cues for alignment in HIIT exercise and stretches              PATIENT EDUCATION: Education details: withhold long contractions, focus on not letting abdominal mm overuse    Person educated: Patient Education method: Explanation, Demonstration, Tactile cues, Verbal cues, and Handouts Education comprehension: verbalized understanding, returned demonstration, verbal cues required, tactile cues required, and needs further education    HOME EXERCISE PROGRAM:  See pt instructions       PT Long Term Goals -       PT LONG TERM GOAL #1   Title Pt will demo increased hip abduction strength from 3/5 to >4/5 in order to maintain walking routine and pelvic stability    Time 8    Period Weeks    Status Achieved    Target Date 02/03/22      PT LONG TERM GOAL #2   Title Pt will demo proper deepcore coordination and pelvic floor movement with no cues to progress to kegel exercises    Time 4    Period Weeks    Status Achieved    Target Date 02/03/22      PT LONG TERM GOAL #3   Title Pt will demo decreased abdominal scar restrictions to advance to deep core exercises and promote IAP  for continence and bowel movement elimination with less dependence on Linzess    Time 4    Period Weeks    Status Achieved    Target Date 02/03/22      PT LONG TERM GOAL #4   Title Pt will increase FOTO score for constipation from 41 pts and Urinary 47 pts to > 55 pts in order to demo improved pelvic floor function    ( 06/15/21: no change urinary, 2 pt change improvement  with bowel function,    11/25/21: neg scores of change, need to read[place] because pt has reported improvements,   04/21/22: constipation 6 pt change, urinary -2 pt change,      07/08/22: constipation constipation 2pt,  urinary 0 pts) )   11/30/22: assess at next session    Time 10    Period Weeks    Status On-going    Target Date 12/24/2022     PT LONG TERM GOAL #5   Title Pt will demo decreased pelvic floor mm tightness and proper relaxation of mm to have less pain with intercourcse and gynecological exams    Time 10    Period Weeks    Status Achieved    Target Date 02/03/22      PT LONG TERM GOAL #6   Title Pt will demo no pelvic floor tightness/ tendernessacorss 2 sessions, demo proper quick contractions > 5 reps and long holds 3sec, 5 reps without cues ino rder to gain continence    Time 10   Period Weeks    Status Ongoing    Target Date 03/08/2023         PT LONG TERM GOAL #7   Title Pt will report dry pad with walking 0.5 miles   in order to be continence with walking routine    Time 12   Period Weeks    Status On-going    Target Date 02/23/2023          PT LONG TERM GOAL #8  Title Pt will perform fitness exercises with proper alignment and technique to minimize overactivity of pelvic floor and incontinence   Time 10   Period Weeks   Status MET   Target Date 12/24/2022          Plan -     Clinical Impression Statement Pt has met 5/8 goals and is progressing gradually towards remaining goals.   Advanced to HIIT with backward lunges with cues for proper alignment and optimal feet  activation, less hyperextensions of knee to minimize injuries. Pt showed proper form post training. Pt got a sweat from 3 sets of  3 min which will be a helpful way for her to continue to workout. Pt voiced she did not experience leakage.   Anticipate pt will achieve remaining goals with monthly visits.   continued  skilled PT .     Examination-Activity Limitations Toileting;Continence;Other    Stability/Clinical Decision Making Evolving/Moderate complexity    Rehab Potential Good    PT Frequency 1x / month   PT Duration Other (comment)  02/23/2023     PT Treatment/Interventions Neuromuscular re-education;Cryotherapy;ADLs/Self Care Home Management;Therapeutic activities;Functional mobility training;Traction;Moist Heat;Stair training;Gait training;Patient/family education;Taping;Manual techniques;Scar mobilization;Balance training;Therapeutic exercise;Spinal Manipulations;Joint Manipulations;Energy conservation    Consulted and Agree with Plan of Care Patient            Jerl Mina, PT 12/28/2022, 9:00 AM

## 2022-12-30 ENCOUNTER — Encounter: Payer: Self-pay | Admitting: Internal Medicine

## 2022-12-30 ENCOUNTER — Ambulatory Visit (INDEPENDENT_AMBULATORY_CARE_PROVIDER_SITE_OTHER): Payer: BC Managed Care – PPO | Admitting: Internal Medicine

## 2022-12-30 VITALS — BP 126/84 | HR 67 | Temp 98.3°F | Ht 62.0 in | Wt 217.0 lb

## 2022-12-30 DIAGNOSIS — G4700 Insomnia, unspecified: Secondary | ICD-10-CM

## 2022-12-30 DIAGNOSIS — K5904 Chronic idiopathic constipation: Secondary | ICD-10-CM | POA: Diagnosis not present

## 2022-12-30 DIAGNOSIS — Z0001 Encounter for general adult medical examination with abnormal findings: Secondary | ICD-10-CM | POA: Diagnosis not present

## 2022-12-30 DIAGNOSIS — D508 Other iron deficiency anemias: Secondary | ICD-10-CM

## 2022-12-30 DIAGNOSIS — Z1159 Encounter for screening for other viral diseases: Secondary | ICD-10-CM | POA: Insufficient documentation

## 2022-12-30 DIAGNOSIS — G4730 Sleep apnea, unspecified: Secondary | ICD-10-CM

## 2022-12-30 DIAGNOSIS — I10 Essential (primary) hypertension: Secondary | ICD-10-CM | POA: Diagnosis not present

## 2022-12-30 DIAGNOSIS — E785 Hyperlipidemia, unspecified: Secondary | ICD-10-CM

## 2022-12-30 DIAGNOSIS — R0609 Other forms of dyspnea: Secondary | ICD-10-CM | POA: Diagnosis not present

## 2022-12-30 DIAGNOSIS — L732 Hidradenitis suppurativa: Secondary | ICD-10-CM | POA: Diagnosis not present

## 2022-12-30 DIAGNOSIS — R7303 Prediabetes: Secondary | ICD-10-CM

## 2022-12-30 LAB — CBC WITH DIFFERENTIAL/PLATELET
Basophils Absolute: 0 10*3/uL (ref 0.0–0.1)
Basophils Relative: 0.6 % (ref 0.0–3.0)
Eosinophils Absolute: 0.1 10*3/uL (ref 0.0–0.7)
Eosinophils Relative: 1.8 % (ref 0.0–5.0)
HCT: 39.6 % (ref 36.0–46.0)
Hemoglobin: 12.7 g/dL (ref 12.0–15.0)
Lymphocytes Relative: 26.4 % (ref 12.0–46.0)
Lymphs Abs: 1.9 10*3/uL (ref 0.7–4.0)
MCHC: 32 g/dL (ref 30.0–36.0)
MCV: 67.1 fl — ABNORMAL LOW (ref 78.0–100.0)
Monocytes Absolute: 0.6 10*3/uL (ref 0.1–1.0)
Monocytes Relative: 7.8 % (ref 3.0–12.0)
Neutro Abs: 4.6 10*3/uL (ref 1.4–7.7)
Neutrophils Relative %: 63.4 % (ref 43.0–77.0)
Platelets: 190 10*3/uL (ref 150.0–400.0)
RBC: 5.91 Mil/uL — ABNORMAL HIGH (ref 3.87–5.11)
RDW: 17.7 % — ABNORMAL HIGH (ref 11.5–15.5)
WBC: 7.2 10*3/uL (ref 4.0–10.5)

## 2022-12-30 LAB — IBC + FERRITIN
Ferritin: 38.7 ng/mL (ref 10.0–291.0)
Iron: 62 ug/dL (ref 42–145)
Saturation Ratios: 17 % — ABNORMAL LOW (ref 20.0–50.0)
TIBC: 365.4 ug/dL (ref 250.0–450.0)
Transferrin: 261 mg/dL (ref 212.0–360.0)

## 2022-12-30 LAB — TROPONIN I (HIGH SENSITIVITY): High Sens Troponin I: 3 ng/L (ref 2–17)

## 2022-12-30 LAB — BRAIN NATRIURETIC PEPTIDE: Pro B Natriuretic peptide (BNP): 21 pg/mL (ref 0.0–100.0)

## 2022-12-30 LAB — TSH: TSH: 0.95 u[IU]/mL (ref 0.35–5.50)

## 2022-12-30 MED ORDER — ROSUVASTATIN CALCIUM 10 MG PO TABS: 10.0000 mg | ORAL_TABLET | Freq: Every day | ORAL | 1 refills | Status: DC

## 2022-12-30 MED ORDER — MINOCYCLINE HCL 50 MG PO TABS: 50.0000 mg | ORAL_TABLET | Freq: Two times a day (BID) | ORAL | 0 refills | Status: DC

## 2022-12-30 NOTE — Patient Instructions (Signed)

## 2022-12-30 NOTE — Progress Notes (Signed)
Subjective:  Patient ID: Molly Bean, female    DOB: 1960-12-11  Age: 62 y.o. MRN: [id number]  CC: Annual Exam, Hypertension, and Hyperlipidemia   HPI Molly Bean presents for a CPX and F/up ---  She has had stable DOE for 2 years.  She walks about a mile a day and denies chest pain, diaphoresis, or edema.  Outpatient Medications Prior to Visit  Medication Sig Dispense Refill   acetaminophen (TYLENOL) 500 MG tablet Take 1,000 mg by mouth every 6 (six) hours as needed for mild pain or headache.     albuterol (VENTOLIN HFA) 108 (90 Base) MCG/ACT inhaler TAKE 2 PUFFS BY MOUTH EVERY 6 HOURS AS NEEDED FOR WHEEZE OR SHORTNESS OF BREATH 8.5 g 0   diazepam (VALIUM) 5 MG tablet Place 1 tablet vaginally nightly as needed for muscle spasm/ pelvic pain. 40 tablet 0   estradiol (ESTRACE) 0.1 MG/GM vaginal cream Place 0.5g nightly for two weeks then twice a week after 30 g 11   Ferrous Sulfate (IRON) 325 (65 Fe) MG TABS Take 1 tablet (325 mg total) by mouth daily at 2 PM. 30 tablet 0   lidocaine-prilocaine (EMLA) cream Apply 1 application. topically as needed. 30 g 5   linaclotide (LINZESS) 145 MCG CAPS capsule 2 po qd 60 capsule 0   metFORMIN (GLUCOPHAGE) 500 MG tablet Take 1 tablet (500 mg total) by mouth 2 (two) times daily with a meal. 60 tablet 0   mirabegron ER (MYRBETRIQ) 25 MG TB24 tablet Take 1 tablet (25 mg total) by mouth daily. 30 tablet 5   Semaglutide, 2 MG/DOSE, 8 MG/3ML SOPN Inject 2 mg as directed once a week. 3 mL 0   Vitamin D, Ergocalciferol, (DRISDOL) 1.25 MG (50000 UNIT) CAPS capsule 1 po q wed, and 1 po q sun 8 capsule 0   rosuvastatin (CRESTOR) 10 MG tablet TAKE 1 TABLET BY MOUTH EVERYDAY AT BEDTIME 30 tablet 0   triamterene-hydrochlorothiazide (MAXZIDE-25) 37.5-25 MG tablet Take 1 tablet by mouth daily. 30 tablet 0   No facility-administered medications prior to visit.    ROS Review of Systems  Respiratory:  Positive for apnea. Negative for choking, shortness of  breath and wheezing.   Cardiovascular:  Negative for chest pain, palpitations and leg swelling.  Gastrointestinal:  Positive for constipation. Negative for abdominal pain, blood in stool, diarrhea, nausea and vomiting.  Genitourinary:  Negative for difficulty urinating.  Musculoskeletal: Negative.  Negative for arthralgias, joint swelling and myalgias.  Skin: Negative.        Painful bumps in armpits  Neurological:  Negative for dizziness, weakness and light-headedness.  Hematological:  Negative for adenopathy. Does not bruise/bleed easily.  Psychiatric/Behavioral:  Positive for sleep disturbance. Negative for behavioral problems, confusion, decreased concentration, dysphoric mood and suicidal ideas. The patient is nervous/anxious.     Objective:  BP 126/84 (BP Location: Right Arm, Patient Position: Sitting, Cuff Size: Large)   Pulse 67   Temp 98.3 F (36.8 C) (Oral)   Ht 5\' 2"  (1.575 m)   Wt 217 lb (98.4 kg)   SpO2 98%   BMI 39.69 kg/m   BP Readings from Last 3 Encounters:  12/30/22 126/84  11/22/22 123/71  11/08/22 133/81    Wt Readings from Last 3 Encounters:  12/30/22 217 lb (98.4 kg)  11/22/22 216 lb (98 kg)  11/08/22 212 lb (96.2 kg)    Physical Exam Vitals reviewed.  Constitutional:      Appearance: She is obese. She is not ill-appearing.  HENT:     Nose: Nose normal.     Mouth/Throat:     Mouth: Mucous membranes are moist.  Eyes:     General: No scleral icterus.    Conjunctiva/sclera: Conjunctivae normal.  Cardiovascular:     Rate and Rhythm: Normal rate and regular rhythm.     Heart sounds: Normal heart sounds, S1 normal and S2 normal. No murmur heard.    No gallop.     Comments: EKG- NSR, 80 bpm Diffuse flat/inverted T waves No LVH or Q waves unchanged Pulmonary:     Effort: Pulmonary effort is normal.     Breath sounds: No stridor. No wheezing, rhonchi or rales.  Abdominal:     General: Abdomen is flat.     Palpations: There is no mass.      Tenderness: There is no abdominal tenderness. There is no guarding or rebound.     Hernia: No hernia is present.  Musculoskeletal:     Cervical back: Neck supple.     Right lower leg: No edema.     Left lower leg: No edema.  Lymphadenopathy:     Cervical: No cervical adenopathy.  Skin:    General: Skin is warm and dry.     Coloration: Skin is not pale.     Comments: Multiple, small, subcu nodules in both axillary regions with fluctuance  Neurological:     General: No focal deficit present.     Mental Status: She is alert. Mental status is at baseline.  Psychiatric:        Mood and Affect: Mood normal.        Behavior: Behavior normal.     Lab Results  Component Value Date   WBC 7.2 12/30/2022   HGB 12.7 12/30/2022   HCT 39.6 12/30/2022   PLT 190.0 12/30/2022   GLUCOSE 74 11/22/2022   CHOL 171 11/22/2022   TRIG 78 11/22/2022   HDL 64 11/22/2022   LDLDIRECT 96 11/22/2022   LDLCALC 92 11/22/2022   ALT 17 11/22/2022   AST 14 11/22/2022   NA 141 11/22/2022   K 4.1 11/22/2022   CL 104 11/22/2022   CREATININE 0.83 11/22/2022   BUN 8 11/22/2022   CO2 23 11/22/2022   TSH 0.95 12/30/2022   INR 1.00 09/25/2016   HGBA1C 6.1 (H) 11/22/2022    DG FEMUR MIN 2 VIEWS LEFT  Result Date: 05/20/2022 CLINICAL DATA:  Severe left hip and buttock pain following an injury yesterday. EXAM: LEFT FEMUR 2 VIEWS COMPARISON:  Pelvis radiographs obtained at the same time. FINDINGS: There is no evidence of fracture or other focal bone lesions. Soft tissues are unremarkable. Posterior femoral linear lucency is compatible with a normal nutrient channel. IMPRESSION: Negative. Electronically Signed   By: Claudie Revering M.D.   On: 05/20/2022 11:10   DG Pelvis 1-2 Views  Result Date: 05/20/2022 CLINICAL DATA:  Trauma, pain EXAM: PELVIS - 1-2 VIEW COMPARISON:  None Available. FINDINGS: No displaced fracture or dislocation is seen. Phleboliths are seen in pelvis. IMPRESSION: No fracture or dislocation is  seen in pelvis. Electronically Signed   By: Elmer Picker M.D.   On: 05/20/2022 11:09    Assessment & Plan:  Prediabetes  Essential hypertension- Her blood pressure is adequately well-controlled. -     TSH; Future -     EKG 12-Lead  Encounter for general adult medical examination with abnormal findings- Exam completed, labs reviewed, vaccines reviewed and updated, cancer screenings are up-to-date, patient education was given. -  Hepatitis C antibody; Future -     HIV Antibody (routine testing w rflx); Future  Need for hepatitis C screening test -     Hepatitis C antibody; Future  Other iron deficiency anemia -     CBC with Differential/Platelet; Future -     IBC + Ferritin; Future  Axillary hidradenitis suppurativa -     Ambulatory referral to Dermatology -     Minocycline HCl; Take 1 tablet (50 mg total) by mouth 2 (two) times daily.  Dispense: 180 tablet; Refill: 0  Chronic idiopathic constipation -     TSH; Future  Insomnia with sleep apnea -     Belsomra; Take 1 tablet (15 mg total) by mouth at bedtime as needed.  Dispense: 90 tablet; Refill: 0  DOE (dyspnea on exertion)- EKG and labs are reassuring.  I think this is due to obesity and poor conditioning. -     Troponin I (High Sensitivity); Future -     Brain natriuretic peptide; Future  Dyslipidemia, goal LDL below 130- LDL goal achieved. Doing well on the statin  -     Rosuvastatin Calcium; Take 1 tablet (10 mg total) by mouth daily.  Dispense: 90 tablet; Refill: 1     Follow-up: Return in about 6 months (around 07/02/2023).  Scarlette Calico, MD

## 2022-12-31 LAB — HIV ANTIBODY (ROUTINE TESTING W REFLEX): HIV 1&2 Ab, 4th Generation: NONREACTIVE

## 2022-12-31 LAB — HEPATITIS C ANTIBODY: Hepatitis C Ab: NONREACTIVE

## 2023-01-02 MED ORDER — BELSOMRA 15 MG PO TABS: 15.0000 mg | ORAL_TABLET | Freq: Every evening | ORAL | 0 refills | Status: DC | PRN

## 2023-01-04 ENCOUNTER — Ambulatory Visit (INDEPENDENT_AMBULATORY_CARE_PROVIDER_SITE_OTHER): Payer: BC Managed Care – PPO | Admitting: Family Medicine

## 2023-01-04 ENCOUNTER — Encounter (INDEPENDENT_AMBULATORY_CARE_PROVIDER_SITE_OTHER): Payer: Self-pay | Admitting: Family Medicine

## 2023-01-04 VITALS — BP 106/72 | HR 76 | Temp 98.6°F | Ht 62.0 in | Wt 215.8 lb

## 2023-01-04 DIAGNOSIS — I10 Essential (primary) hypertension: Secondary | ICD-10-CM

## 2023-01-04 DIAGNOSIS — E7849 Other hyperlipidemia: Secondary | ICD-10-CM | POA: Diagnosis not present

## 2023-01-04 DIAGNOSIS — R7303 Prediabetes: Secondary | ICD-10-CM | POA: Diagnosis not present

## 2023-01-04 DIAGNOSIS — K5909 Other constipation: Secondary | ICD-10-CM

## 2023-01-04 DIAGNOSIS — E559 Vitamin D deficiency, unspecified: Secondary | ICD-10-CM

## 2023-01-04 DIAGNOSIS — Z6839 Body mass index (BMI) 39.0-39.9, adult: Secondary | ICD-10-CM | POA: Insufficient documentation

## 2023-01-04 MED ORDER — SEMAGLUTIDE (2 MG/DOSE) 8 MG/3ML ~~LOC~~ SOPN: 2.0000 mg | PEN_INJECTOR | SUBCUTANEOUS | 0 refills | Status: DC

## 2023-01-04 MED ORDER — METFORMIN HCL 500 MG PO TABS: 500.0000 mg | ORAL_TABLET | Freq: Two times a day (BID) | ORAL | 0 refills | Status: DC

## 2023-01-04 MED ORDER — LINACLOTIDE 145 MCG PO CAPS: ORAL_CAPSULE | ORAL | 0 refills | Status: DC

## 2023-01-04 MED ORDER — VITAMIN D (ERGOCALCIFEROL) 1.25 MG (50000 UNIT) PO CAPS: ORAL_CAPSULE | ORAL | 0 refills | Status: DC

## 2023-01-04 NOTE — Progress Notes (Signed)
[name], D.O.  ABFM, ABOM Specializing in Clinical Bariatric Medicine  Office located at: [address] [place], [place]  [address]     Assessment and Plan:   Medications Discontinued During This Encounter  Medication Reason   Semaglutide, 2 MG/DOSE, 8 MG/3ML SOPN Reorder   metFORMIN (GLUCOPHAGE) 500 MG tablet Reorder   linaclotide (LINZESS) 145 MCG CAPS capsule Reorder   Vitamin D, Ergocalciferol, (DRISDOL) 1.25 MG (50000 UNIT) CAPS capsule Reorder     Meds ordered this encounter  Medications   Vitamin D, Ergocalciferol, (DRISDOL) 1.25 MG (50000 UNIT) CAPS capsule    Sig: 1 po q wed, and 1 po q sun    Dispense:  8 capsule    Refill:  0    Ov for rf   linaclotide (LINZESS) 145 MCG CAPS capsule    Sig: 2 po qd    Dispense:  60 capsule    Refill:  0   metFORMIN (GLUCOPHAGE) 500 MG tablet    Sig: Take 1 tablet (500 mg total) by mouth 2 (two) times daily with a meal.    Dispense:  60 tablet    Refill:  0    Ov for rf   Semaglutide, 2 MG/DOSE, 8 MG/3ML SOPN    Sig: Inject 2 mg as directed once a week.    Dispense:  3 mL    Refill:  0     Essential hypertension Assessment: Condition is stable. Labs were reviewed. Last 3 blood pressure readings in our office are as follows: BP Readings from Last 3 Encounters:  01/04/23 106/72  12/30/22 126/84  11/22/22 123/71  Patient is not currently on any medication for blood pressure. No concerns. Asymptomatic. She has been off blood pressure per her physician for the last 2 weeks.   Plan: Continue monitoring bp at home - Continue with Prudent nutritional plan and low sodium diet, advance exercise as tolerated.   - We will continue to monitor symptoms as they relate to the her weight loss journey.    Other hyperlipidemia Assessment: Condition is stable. Labs were reviewed.  Lab Results  Component Value Date   CHOL 171 11/22/2022   HDL 64 11/22/2022   LDLCALC 92 11/22/2022   LDLDIRECT 96 11/22/2022   TRIG 78  11/22/2022   CHOLHDL 2.7 11/22/2022      Component Value Date/Time   PROT 6.0 11/22/2022 1446   ALBUMIN 3.7 (L) 11/22/2022 1446   AST 14 11/22/2022 1446   ALT 17 11/22/2022 1446   ALKPHOS 76 11/22/2022 1446   BILITOT <0.2 11/22/2022 1446   BILIDIR 0.0 01/30/2013 1213  No issues with Crestor 10 mg daily, compliance good.  Plan: Continue with med as recommended by PCP.  Continue her prudent nutritional plan and continue to advance exercise and cardiovascular fitness as tolerated.  I stressed the importance of getting in all her daily lean protein.    Vitamin D deficiency Assessment: Condition is not optimized. Labs were reviewed.  Lab Results  Component Value Date   VD25OH 43.7 11/22/2022   VD25OH 57.3 03/30/2022   VD25OH 30.5 11/03/2021  Patient has been more compliant with Vitamin D 1.25 mg twice weekly. Denies any side effects.   Plan: Will refill Vitamin D today.  Continue with med. - weight loss will likely improve availability of vitamin D, thus encouraged Naiyana to continue with meal plan and their weight loss efforts to further improve this condition.  Thus, we will need to monitor levels regularly (  every 3-4 mo on average) to keep levels within normal limits and prevent over supplementation.   Pre-diabetes Assessment: Condition is Not optimized. Labs were reviewed.  Lab Results  Component Value Date   HGBA1C 6.1 (H) 11/22/2022   HGBA1C 5.7 (H) 03/30/2022   HGBA1C 5.7 (H) 11/03/2021   INSULIN 9.1 03/30/2022   INSULIN 22.5 11/03/2021   INSULIN 21.1 03/23/2021  She has been more compliant with Metformin 500 mg BID and Semaglutide 2 mg weekly. Denies any side effects. Her hunger and cravings are well controlled.   Plan: Will refill Metformin and Semaglutide today.  Continue with meds.  - Blayklee will continue to work on weight loss, exercise, via their meal plan we devised to help decrease the risk of progressing to diabetes.  - Continue to decrease simple carbs/  sugars; increase fiber and proteins -> follow her meal plan.   - I recommended that journaling her intake will help her to monitor what she is eating. Mointering food intake more closely will also likely help bring her A1c levels down.   Other constipation Assessment: Condition is stable.  Lab Results  Component Value Date   CREATININE 0.83 11/22/2022   BUN 8 11/22/2022   NA 141 11/22/2022   K 4.1 11/22/2022   CL 104 11/22/2022   CO2 23 11/22/2022  Patient is on Linzess 2 mg daily. Her symptoms are well controlled.  Plan: Will refill Linzess today. Continue with med.  Continue her prudent nutritional plan and continue to advance exercise and cardiovascular fitness as tolerated.   TREATMENT PLAN FOR OBESITY: BMI 39.0-39.9,adult-current bmi 39.5 Morbid obesity (HCC)-start bmi 43.90/date 04/14/20 Assessment: Condition is Not optimized. Biometric data collected today, was reviewed with patient.  Fat mass has increased by .2lb. Muscle mass has decreased by .4lb. Total body water has increased by 1lb.   Plan: Continue with  Category 1 meal plan with breakfast and lunch options.  We discussed several meals that she could have for breakfast like protein shakes.  Behavioral Intervention Additional resources provided today: category 1 meal plan information and patient declined Evidence-based interventions for health behavior change were utilized today including the discussion of self monitoring techniques, problem-solving barriers and SMART goal setting techniques.   Regarding patient's less desirable eating habits and patterns, we employed the technique of small changes.  Pt will specifically work on: continuing the Category 1 meal plan with breakfast and lunch options  for next visit.    Recommended Physical Activity Goals Maxi has been advised to work up to 150 minutes of moderate intensity aerobic activity a week and strengthening exercises 2-3 times per week for cardiovascular  health, weight loss maintenance and preservation of muscle mass.  She has agreed to Continue current level of physical activity    FOLLOW UP: No follow-ups on file. She was informed of the importance of frequent follow up visits to maximize her success with intensive lifestyle modifications for her multiple health conditions.  Subjective:   Chief complaint: Obesity Damesha is here to discuss her progress with her obesity treatment plan. She is on the the Category 1 Plan with breakfast and lunch options and states she is following her eating plan approximately 90% of the time. She states she is walking 30-45 minutes 6 days per week.  Interval History:  Paytn Armenta is here for a follow up office visit. Since the last office visit, she endorses that following the meal and meal prepping as been better.  We reviewed her meal plan and  all questions were answered. Patient's food recall appears to be accurate and consistent with what is on plan when she is following it. When eating on plan, her hunger and cravings are well controlled.      Pharmacotherapy for weight loss: She is currently taking  Semaglutide and Metformin  for medical weight loss.  Denies side effects.    Review of Systems:  Pertinent positives were addressed with patient today.  Weight Summary and Biometrics   Weight Lost Since Last Visit: 1lb  No data recorded   Vitals Temp: 98.6 F (37 C) BP: 106/72 Pulse Rate: 76 SpO2: 98 %   Anthropometric Measurements Height:  (1.575 m) Weight: 215 lb 12.8 oz (97.9 kg) BMI (Calculated): 39.46 Weight at Last Visit: 216lb Weight Lost Since Last Visit: 1lb Starting Weight: 240lb Total Weight Loss (lbs): 25 lb (11.3 kg) Peak Weight: 242lb   Body Composition  Body Fat %: 50.2 % Fat Mass (lbs): 108.4 lbs Muscle Mass (lbs): 102 lbs Total Body Water (lbs): 81.6 lbs Visceral Fat Rating : 16   Other Clinical Data Fasting: no Labs: no Today's Visit #:  37 Starting Date: 04/14/20   Objective:   PHYSICAL EXAM:  Blood pressure 106/72, pulse 76, temperature 98.6 F (37 C), height  (1.575 m), weight 215 lb 12.8 oz (97.9 kg), SpO2 98 %. Body mass index is 39.47 kg/m.  General: Well Developed, well nourished, and in no acute distress.  HEENT: Normocephalic, atraumatic Skin: Warm and dry, cap RF less 2 sec, good turgor Chest:  Normal excursion, shape, no gross abn Respiratory: speaking in full sentences, no conversational dyspnea NeuroM-Sk: Ambulates w/o assistance, moves * 4 Psych: A and O *3, insight good, mood-full  DIAGNOSTIC DATA REVIEWED:  BMET    Component Value Date/Time   NA 141 11/22/2022 1446   K 4.1 11/22/2022 1446   CL 104 11/22/2022 1446   CO2 23 11/22/2022 1446   GLUCOSE 74 11/22/2022 1446   GLUCOSE 95 11/12/2019 0930   BUN 8 11/22/2022 1446   CREATININE 0.83 11/22/2022 1446   CALCIUM 8.7 11/22/2022 1446   GFRNONAA 91 04/15/2020 1654   GFRAA 105 04/15/2020 1654   Lab Results  Component Value Date   HGBA1C 6.1 (H) 11/22/2022   HGBA1C 6.1 (H) 04/15/2020   Lab Results  Component Value Date   INSULIN 9.1 03/30/2022   INSULIN 21.9 04/15/2020   Lab Results  Component Value Date   TSH 0.95 12/30/2022   CBC    Component Value Date/Time   WBC 7.2 12/30/2022 1429   RBC 5.91 (H) 12/30/2022 1429   HGB 12.7 12/30/2022 1429   HGB 12.5 03/30/2022 1049   HCT 39.6 12/30/2022 1429   HCT 39.5 03/30/2022 1049   PLT 190.0 12/30/2022 1429   PLT 236 03/30/2022 1049   MCV 67.1 Repeated and verified X2. (L) 12/30/2022 1429   MCV 68 (L) 03/30/2022 1049   MCH 21.6 (L) 03/30/2022 1049   MCH 21.7 (L) 09/25/2016 0012   MCHC 32.0 12/30/2022 1429   RDW 17.7 (H) 12/30/2022 1429   RDW 19.0 (H) 03/30/2022 1049   Iron Studies    Component Value Date/Time   IRON 62 12/30/2022 1429   IRON 63 11/03/2021 0942   TIBC 365.4 12/30/2022 1429   TIBC 332 11/03/2021 0942   FERRITIN 38.7 12/30/2022 1429   FERRITIN 72  11/03/2021 0942   IRONPCTSAT 17.0 (L) 12/30/2022 1429   IRONPCTSAT 19 11/03/2021 0942   Lipid Panel  Component Value Date/Time   CHOL 171 11/22/2022 1446   TRIG 78 11/22/2022 1446   HDL 64 11/22/2022 1446   CHOLHDL 2.7 11/22/2022 1446   CHOLHDL 3 11/06/2010 1638   VLDL 20.8 11/06/2010 1638   LDLCALC 92 11/22/2022 1446   LDLDIRECT 96 11/22/2022 1446   Hepatic Function Panel     Component Value Date/Time   PROT 6.0 11/22/2022 1446   ALBUMIN 3.7 (L) 11/22/2022 1446   AST 14 11/22/2022 1446   ALT 17 11/22/2022 1446   ALKPHOS 76 11/22/2022 1446   BILITOT <0.2 11/22/2022 1446   BILIDIR 0.0 01/30/2013 1213      Component Value Date/Time   TSH 0.95 12/30/2022 1429   Nutritional Lab Results  Component Value Date   VD25OH 43.7 11/22/2022   VD25OH 57.3 03/30/2022   VD25OH 30.5 11/03/2021    Attestations:   Reviewed by clinician on day of visit: allergies, medications, problem list, medical history, surgical history, family history, social history, and previous encounter notes.   Patient was in the office today and time spent on visit including pre-visit chart review and post-visit care/coordination of care and electronic medical record documentation was 45 minutes. 50% of the time was in face to face counseling of this patient's medical condition(s) and providing education on treatment options to include the first-line treatment of diet and lifestyle modification.   I,Special Puri,acting as a Neurosurgeon for [employer], DO.,have documented all relevant documentation on the behalf of Thomasene Lot, DO,as directed by  Thomasene Lot, DO while in the presence of Thomasene Lot, DO.   I, Thomasene Lot, DO, have reviewed all documentation for this visit. The documentation on 01/04/23 for the exam, diagnosis, procedures, and orders are all accurate and complete.

## 2023-01-11 ENCOUNTER — Encounter: Admitting: Physical Therapy

## 2023-01-12 ENCOUNTER — Telehealth: Payer: Self-pay | Admitting: Internal Medicine

## 2023-01-12 ENCOUNTER — Other Ambulatory Visit: Payer: Self-pay | Admitting: Internal Medicine

## 2023-01-12 DIAGNOSIS — E785 Hyperlipidemia, unspecified: Secondary | ICD-10-CM

## 2023-01-12 DIAGNOSIS — G4700 Insomnia, unspecified: Secondary | ICD-10-CM

## 2023-01-12 DIAGNOSIS — L732 Hidradenitis suppurativa: Secondary | ICD-10-CM

## 2023-01-12 NOTE — Telephone Encounter (Signed)
Patient called and said that the prescriptions that were sent on 12/30/2022 to [facility] need to be sent to [facility] on [address].  [facility] told[facility] her that she is not eligible for the home delivery.  Please send them again.

## 2023-01-13 ENCOUNTER — Other Ambulatory Visit: Payer: Self-pay | Admitting: Internal Medicine

## 2023-01-13 DIAGNOSIS — E785 Hyperlipidemia, unspecified: Secondary | ICD-10-CM

## 2023-01-13 DIAGNOSIS — L732 Hidradenitis suppurativa: Secondary | ICD-10-CM

## 2023-01-13 DIAGNOSIS — G4700 Insomnia, unspecified: Secondary | ICD-10-CM

## 2023-01-13 MED ORDER — MINOCYCLINE HCL 50 MG PO TABS
50.0000 mg | ORAL_TABLET | Freq: Two times a day (BID) | ORAL | 0 refills | Status: AC
Start: 2023-01-13 — End: ?

## 2023-01-13 MED ORDER — BELSOMRA 15 MG PO TABS
15.0000 mg | ORAL_TABLET | Freq: Every evening | ORAL | 0 refills | Status: AC | PRN
Start: 2023-01-13 — End: ?

## 2023-01-13 MED ORDER — ROSUVASTATIN CALCIUM 10 MG PO TABS
10.0000 mg | ORAL_TABLET | Freq: Every day | ORAL | 1 refills | Status: AC
Start: 2023-01-13 — End: ?

## 2023-01-19 ENCOUNTER — Ambulatory Visit (INDEPENDENT_AMBULATORY_CARE_PROVIDER_SITE_OTHER): Payer: BC Managed Care – PPO | Admitting: Family Medicine

## 2023-01-19 ENCOUNTER — Encounter (INDEPENDENT_AMBULATORY_CARE_PROVIDER_SITE_OTHER): Payer: Self-pay | Admitting: Family Medicine

## 2023-01-19 VITALS — BP 132/88 | HR 66 | Temp 98.7°F | Ht 62.0 in | Wt 214.4 lb

## 2023-01-19 DIAGNOSIS — K5909 Other constipation: Secondary | ICD-10-CM | POA: Diagnosis not present

## 2023-01-19 DIAGNOSIS — E559 Vitamin D deficiency, unspecified: Secondary | ICD-10-CM | POA: Diagnosis not present

## 2023-01-19 DIAGNOSIS — Z6839 Body mass index (BMI) 39.0-39.9, adult: Secondary | ICD-10-CM

## 2023-01-19 DIAGNOSIS — R7303 Prediabetes: Secondary | ICD-10-CM

## 2023-01-19 MED ORDER — METFORMIN HCL 500 MG PO TABS: 500.0000 mg | ORAL_TABLET | Freq: Two times a day (BID) | ORAL | 0 refills | Status: DC

## 2023-01-19 MED ORDER — LINACLOTIDE 145 MCG PO CAPS
ORAL_CAPSULE | ORAL | 0 refills | Status: DC
Start: 2023-01-19 — End: 2023-02-10

## 2023-01-19 MED ORDER — SEMAGLUTIDE (2 MG/DOSE) 8 MG/3ML ~~LOC~~ SOPN: 2.0000 mg | PEN_INJECTOR | SUBCUTANEOUS | 0 refills | Status: DC

## 2023-01-19 MED ORDER — VITAMIN D (ERGOCALCIFEROL) 1.25 MG (50000 UNIT) PO CAPS
ORAL_CAPSULE | ORAL | 0 refills | Status: DC
Start: 2023-01-19 — End: 2023-02-10

## 2023-01-19 NOTE — Progress Notes (Signed)
Molly Bean, D.O.  ABFM, ABOM Specializing in Clinical Bariatric Medicine  Office located at: [address] [place], [place]  [address]     Assessment and Plan:    Medications Discontinued During This Encounter  Medication Reason   Vitamin D, Ergocalciferol, (DRISDOL) 1.25 MG (50000 UNIT) CAPS capsule Reorder   linaclotide (LINZESS) 145 MCG CAPS capsule Reorder   metFORMIN (GLUCOPHAGE) 500 MG tablet Reorder   Semaglutide, 2 MG/DOSE, 8 MG/3ML SOPN Reorder     Meds ordered this encounter  Medications   Vitamin D, Ergocalciferol, (DRISDOL) 1.25 MG (50000 UNIT) CAPS capsule    Sig: 1 po q wed, and 1 po q sun    Dispense:  8 capsule    Refill:  0    Ov for rf   metFORMIN (GLUCOPHAGE) 500 MG tablet    Sig: Take 1 tablet (500 mg total) by mouth 2 (two) times daily with a meal.    Dispense:  60 tablet    Refill:  0    Ov for rf   linaclotide (LINZESS) 145 MCG CAPS capsule    Sig: 2 po qd    Dispense:  60 capsule    Refill:  0   Semaglutide, 2 MG/DOSE, 8 MG/3ML SOPN    Sig: Inject 2 mg as directed once a week.    Dispense:  3 mL    Refill:  0     Pre-diabetes Assessment: Condition is not optimized. Lab Results  Component Value Date   HGBA1C 6.1 (H) 11/22/2022   HGBA1C 5.7 (H) 03/30/2022   HGBA1C 5.7 (H) 11/03/2021   INSULIN 9.1 03/30/2022   INSULIN 22.5 11/03/2021   INSULIN 21.1 03/23/2021  Patient has been compliant with  Metformin 500 mg BID and Semaglutide 2 mg weekly. Denies any GI side effects. She endorses that her hunger and cravings are well controlled.   Plan: Will refill both meds today. Continue Metformin and Semaglutide. Continue her prudent nutritional plan that is low in simple carbohydrates, saturated fats and trans fats to goal of 5-10% weight loss to achieve significant health benefits.  Pt encouraged to continually advance exercise and cardiovascular fitness as tolerated throughout weight loss journey.     Vitamin D  deficiency Assessment: Condition is Not optimized. Lab Results  Component Value Date   VD25OH 43.7 11/22/2022   VD25OH 57.3 03/30/2022   VD25OH 30.5 11/03/2021  Patient has been more compliant with Ergocalciferol 50K IU twice weekly. Denies any side effects.  Plan: Continue with med. Will refill this today.   - weight loss will likely improve availability of vitamin D, thus encouraged Myrakle to continue with meal plan and their weight loss efforts to further improve this condition.  Thus, we will need to monitor levels regularly (every 3-4 mo on average) to keep levels within normal limits and prevent over supplementation.   Other constipation Assessment: Condition is stable. No issues with Linzess 2 mg daily. Denies any side effects. Her symptoms are well controlled.  Plan: Continue with med. Will refill this today.   - Adequate daily water intake encouraged along with activity/ movement  - Eat plenty of fiber (goal is over 25 grams each day).  It is best to get most of your fiber from dietary sources which includes leafy green vegetables, fresh fruit, and whole grains.    TREATMENT PLAN FOR OBESITY: BMI 39.0-39.9,adult-current bmi 39.2 Morbid obesity (HCC)-start bmi 43.90/date 04/14/20 Assessment: Condition is improving, but not optimized.Biometric data collected today, was  reviewed with patient.  Fat mass has decreased by 3.6lb. Muscle mass has increased by 2lb. Total body water has decreased by 1.8lb.   Plan:  Alixandria is currently in the action stage of change. As such, her goal is to continue weight management plan. Ciana will work on healthier eating habits and try their best to continue the Category 1 meal plan with breakfast and lunch options.  I reviewed her meal plan with her again to give her some additional meal ideas.   Behavioral Intervention Additional resources provided today: lunch options Evidence-based interventions for health behavior change were utilized  today including the discussion of self monitoring techniques, problem-solving barriers and SMART goal setting techniques.   Regarding patient's less desirable eating habits and patterns, we employed the technique of small changes.  Pt will specifically work on: increase water intake to 7 bottles daily for next visit.    Recommended Physical Activity Goals Raynell has been advised to work up to 150 minutes of moderate intensity aerobic activity a week and strengthening exercises 2-3 times per week for cardiovascular health, weight loss maintenance and preservation of muscle mass.  She has agreed to Continue current level of physical activity   FOLLOW UP: Return in about 3 weeks (around 02/09/2023). She was informed of the importance of frequent follow up visits to maximize her success with intensive lifestyle modifications for her multiple health conditions.   Subjective:   Chief complaint: Obesity Analys is here to discuss her progress with her obesity treatment plan. She is on the the Category 1 Plan with breakfast and lunch options and states she is following her eating plan approximately 85% of the time. She states she is walking 60 minutes and exercising 45 minutes 6 days per week.   Interval History:  Shacara Cozine is here for a follow up office visit. Since last office visit she is drinking 5 bottles of water daily. On a daily basis, she has been able to eat most of the proteins on her meal plan. We reviewed her meal plan and all questions were answered. Patient's food recall appears to be accurate and consistent with what is on plan when she is following it. When eating on plan, her hunger and cravings are well controlled.      Pharmacotherapy for weight loss: She is currently taking Metformin and Semaglutide for medical weight loss.  Denies side effects.    Review of Systems:  Pertinent positives were addressed with patient today.   Weight Summary and Biometrics   Weight  Lost Since Last Visit: 1lb  Weight Gained Since Last Visit: 0    Vitals Temp: 98.7 F (37.1 C) BP: 132/88 Pulse Rate: 66 SpO2: 99 %   Anthropometric Measurements Height:  (1.575 m) Weight: 214 lb 6.4 oz (97.3 kg) BMI (Calculated): 39.2 Weight at Last Visit: 215lb Weight Lost Since Last Visit: 1lb Weight Gained Since Last Visit: 0 Starting Weight: 240lb Total Weight Loss (lbs): 26 lb (11.8 kg) Peak Weight: 242lb   Body Composition  Body Fat %: 48.9 % Fat Mass (lbs): 104.8 lbs Muscle Mass (lbs): 104 lbs Total Body Water (lbs): 79.8 lbs Visceral Fat Rating : 16   Other Clinical Data Fasting: no Labs: no Today's Visit #: 38 Starting Date: 04/14/20    Objective:   PHYSICAL EXAM: Blood pressure 132/88, pulse 66, temperature 98.7 F (37.1 C), height  (1.575 m), weight 214 lb 6.4 oz (97.3 kg), SpO2 99 %. Body mass index is 39.21 kg/m.  General: Well Developed, well nourished, and in no acute distress.  HEENT: Normocephalic, atraumatic Skin: Warm and dry, cap RF less 2 sec, good turgor Chest:  Normal excursion, shape, no gross abn Respiratory: speaking in full sentences, no conversational dyspnea NeuroM-Sk: Ambulates w/o assistance, moves * 4 Psych: A and O *3, insight good, mood-full  DIAGNOSTIC DATA REVIEWED:  BMET    Component Value Date/Time   NA 141 11/22/2022 1446   K 4.1 11/22/2022 1446   CL 104 11/22/2022 1446   CO2 23 11/22/2022 1446   GLUCOSE 74 11/22/2022 1446   GLUCOSE 95 11/12/2019 0930   BUN 8 11/22/2022 1446   CREATININE 0.83 11/22/2022 1446   CALCIUM 8.7 11/22/2022 1446   GFRNONAA 91 04/15/2020 1654   GFRAA 105 04/15/2020 1654   Lab Results  Component Value Date   HGBA1C 6.1 (H) 11/22/2022   HGBA1C 6.1 (H) 04/15/2020   Lab Results  Component Value Date   INSULIN 9.1 03/30/2022   INSULIN 21.9 04/15/2020   Lab Results  Component Value Date   TSH 0.95 12/30/2022   CBC    Component Value Date/Time   WBC 7.2  12/30/2022 1429   RBC 5.91 (H) 12/30/2022 1429   HGB 12.7 12/30/2022 1429   HGB 12.5 03/30/2022 1049   HCT 39.6 12/30/2022 1429   HCT 39.5 03/30/2022 1049   PLT 190.0 12/30/2022 1429   PLT 236 03/30/2022 1049   MCV 67.1 Repeated and verified X2. (L) 12/30/2022 1429   MCV 68 (L) 03/30/2022 1049   MCH 21.6 (L) 03/30/2022 1049   MCH 21.7 (L) 09/25/2016 0012   MCHC 32.0 12/30/2022 1429   RDW 17.7 (H) 12/30/2022 1429   RDW 19.0 (H) 03/30/2022 1049   Iron Studies    Component Value Date/Time   IRON 62 12/30/2022 1429   IRON 63 11/03/2021 0942   TIBC 365.4 12/30/2022 1429   TIBC 332 11/03/2021 0942   FERRITIN 38.7 12/30/2022 1429   FERRITIN 72 11/03/2021 0942   IRONPCTSAT 17.0 (L) 12/30/2022 1429   IRONPCTSAT 19 11/03/2021 0942   Lipid Panel     Component Value Date/Time   CHOL 171 11/22/2022 1446   TRIG 78 11/22/2022 1446   HDL 64 11/22/2022 1446   CHOLHDL 2.7 11/22/2022 1446   CHOLHDL 3 11/06/2010 1638   VLDL 20.8 11/06/2010 1638   LDLCALC 92 11/22/2022 1446   LDLDIRECT 96 11/22/2022 1446   Hepatic Function Panel     Component Value Date/Time   PROT 6.0 11/22/2022 1446   ALBUMIN 3.7 (L) 11/22/2022 1446   AST 14 11/22/2022 1446   ALT 17 11/22/2022 1446   ALKPHOS 76 11/22/2022 1446   BILITOT <0.2 11/22/2022 1446   BILIDIR 0.0 01/30/2013 1213      Component Value Date/Time   TSH 0.95 12/30/2022 1429   Nutritional Lab Results  Component Value Date   VD25OH 43.7 11/22/2022   VD25OH 57.3 03/30/2022   VD25OH 30.5 11/03/2021    Attestations:   Reviewed by clinician on day of visit: allergies, medications, problem list, medical history, surgical history, family history, social history, and previous encounter notes.  I,Special Puri,acting as a Neurosurgeon for [employer], DO.,have documented all relevant documentation on the behalf of Thomasene Lot, DO,as directed by  Thomasene Lot, DO while in the presence of Thomasene Lot, DO.   I, Thomasene Lot, DO,  have reviewed all documentation for this visit. The documentation on 01/19/23 for the exam, diagnosis, procedures, and orders are all accurate and  complete.

## 2023-01-20 ENCOUNTER — Ambulatory Visit (INDEPENDENT_AMBULATORY_CARE_PROVIDER_SITE_OTHER): Payer: BC Managed Care – PPO | Admitting: Family Medicine

## 2023-01-25 ENCOUNTER — Ambulatory Visit: Payer: BC Managed Care – PPO | Attending: Obstetrics and Gynecology | Admitting: Physical Therapy

## 2023-02-08 ENCOUNTER — Encounter: Admitting: Physical Therapy

## 2023-02-10 ENCOUNTER — Ambulatory Visit (INDEPENDENT_AMBULATORY_CARE_PROVIDER_SITE_OTHER): Payer: BC Managed Care – PPO | Admitting: Family Medicine

## 2023-02-10 ENCOUNTER — Encounter (INDEPENDENT_AMBULATORY_CARE_PROVIDER_SITE_OTHER): Payer: Self-pay | Admitting: Family Medicine

## 2023-02-10 VITALS — BP 119/68 | HR 72 | Temp 98.2°F | Ht 62.0 in | Wt 217.0 lb

## 2023-02-10 DIAGNOSIS — Z6839 Body mass index (BMI) 39.0-39.9, adult: Secondary | ICD-10-CM

## 2023-02-10 DIAGNOSIS — E559 Vitamin D deficiency, unspecified: Secondary | ICD-10-CM | POA: Diagnosis not present

## 2023-02-10 DIAGNOSIS — R7303 Prediabetes: Secondary | ICD-10-CM

## 2023-02-10 DIAGNOSIS — K5909 Other constipation: Secondary | ICD-10-CM

## 2023-02-10 MED ORDER — LINACLOTIDE 145 MCG PO CAPS
ORAL_CAPSULE | ORAL | 0 refills | Status: DC
Start: 2023-02-10 — End: 2023-03-31

## 2023-02-10 MED ORDER — VITAMIN D (ERGOCALCIFEROL) 1.25 MG (50000 UNIT) PO CAPS
ORAL_CAPSULE | ORAL | 0 refills | Status: DC
Start: 2023-02-10 — End: 2023-03-31

## 2023-02-10 MED ORDER — SEMAGLUTIDE (2 MG/DOSE) 8 MG/3ML ~~LOC~~ SOPN: 2.0000 mg | PEN_INJECTOR | SUBCUTANEOUS | 0 refills | Status: DC

## 2023-02-10 MED ORDER — METFORMIN HCL 500 MG PO TABS: 500.0000 mg | ORAL_TABLET | Freq: Two times a day (BID) | ORAL | 0 refills | Status: DC

## 2023-02-10 NOTE — Progress Notes (Signed)
Molly Bean, D.O.  ABFM, ABOM Specializing in Clinical Bariatric Medicine  Office located at: [address] [place], [place]  [address]     Assessment and Plan:   Medications Discontinued During This Encounter  Medication Reason   Vitamin D, Ergocalciferol, (DRISDOL) 1.25 MG (50000 UNIT) CAPS capsule Reorder   metFORMIN (GLUCOPHAGE) 500 MG tablet Reorder   linaclotide (LINZESS) 145 MCG CAPS capsule Reorder   Semaglutide, 2 MG/DOSE, 8 MG/3ML SOPN Reorder     Meds ordered this encounter  Medications   metFORMIN (GLUCOPHAGE) 500 MG tablet    Sig: Take 1 tablet (500 mg total) by mouth 2 (two) times daily with a meal.    Dispense:  60 tablet    Refill:  0    Ov for rf   Vitamin D, Ergocalciferol, (DRISDOL) 1.25 MG (50000 UNIT) CAPS capsule    Sig: 1 po q wed, and 1 po q sun    Dispense:  8 capsule    Refill:  0    Ov for rf   Semaglutide, 2 MG/DOSE, 8 MG/3ML SOPN    Sig: Inject 2 mg as directed once a week.    Dispense:  3 mL    Refill:  0   linaclotide (LINZESS) 145 MCG CAPS capsule    Sig: 2 po qd    Dispense:  60 capsule    Refill:  0    Check A1c and Vitamin D next OV  Other constipation Assessment: Condition is stable. - Endorses that she felt constipated last week, which is likely due to dehydration. She is feeling better now.  - She is drinking 5-6 bottles of water daily. - She is compliant with Linzess 2 mg daily. Denies any side effects.   Plan:  - Continue with med as prescribed. Will refill this today.   - Unless pre-existing renal or cardiopulmonary conditions exist which patient was told to limit their fluid intake by another provider, I recommended roughly one half of their weight in pounds, to be the approximate ounces of non-caloric, non-caffeinated beverages they should drink per day; including more if they are engaging in exercise.  - Eat plenty of fiber (goal is over 25 grams each day).     Pre-diabetes Assessment: Condition is not  optimized. Lab Results  Component Value Date   HGBA1C 6.1 (H) 11/22/2022   HGBA1C 5.7 (H) 03/30/2022   HGBA1C 5.7 (H) 11/03/2021   INSULIN 9.1 03/30/2022   INSULIN 22.5 11/03/2021   INSULIN 21.1 03/23/2021  - Good compliance and tolerance with Metformin 500 mg BID and Semaglutide 2 mg weekly. Denies any side effects. - Hunger and cravings are well controlled when eating on plan.  Plan: - Continue with meds as prescribed.  Will refill Metformin and Semaglutide today.     - Alaura will continue to work on weight loss, exercise, via their meal plan we devised to help decrease the risk of progressing to diabetes.     Vitamin D deficiency Assessment: Condition is not optimized. Lab Results  Component Value Date   VD25OH 43.7 11/22/2022   VD25OH 57.3 03/30/2022   VD25OH 30.5 11/03/2021  - No issues with Ergocalciferol 50K IU twice weekly. Denies any side effects.   Plan: - Continue with med. Will refill this today.   -Continue her prudent nutritional plan that is low in simple carbohydrates, saturated fats and trans fats to goal of 5-10% weight loss to achieve significant health benefits.  Pt encouraged to continually advance  exercise and cardiovascular fitness as tolerated throughout weight loss journey.    TREATMENT PLAN FOR OBESITY: BMI 39.0-39.9,adult-current bmi 39.2 Morbid obesity (HCC)-start bmi 43.90/date 04/14/20 Assessment:  Molly Bean is here to discuss her progress with her obesity treatment plan along with follow-up of her obesity related diagnoses. See Medical Weight Management Flowsheet for complete bioelectrical impedance results.  Condition is not optimized. Biometric data was collected and reviewed with patient.   Since last office visit on 4/17 patient's Fat mass has increased by 6.6 lb. Muscle mass has decreased by 3.2 lb. Total body water has increased by 2.2 lb.  Counseling done on how various foods will affect these numbers and how to maximize  success  Weight change since last visit: + 3 lbs. Total lbs lost to date: 23 Total weight loss percentage to date: 9.58    Plan:  [name] will work on healthier eating habits and continue the Category 1 meal plan with breakfast and lunch options.  - Discussed ways she can eat healthy when eating out for Mother's day.   Behavioral Intervention Additional resources provided today: patient declined Evidence-based interventions for health behavior change were utilized today including the discussion of self monitoring techniques, problem-solving barriers and SMART goal setting techniques.   Regarding patient's less desirable eating habits and patterns, we employed the technique of small changes.  Pt will specifically work on: continue adherence to prudent nutritional and drinking 8 bottles of water daily for next visit.    Recommended Physical Activity Goals  Charise has been advised to slowly work up to 150 minutes of moderate intensity aerobic activity a week and strengthening exercises 2-3 times per week for cardiovascular health, weight loss maintenance and preservation of muscle mass.   She has agreed to Continue current level of physical activity   FOLLOW UP: Return in about 3 weeks (around 03/03/2023). She was informed of the importance of frequent follow up visits to maximize her success with intensive lifestyle modifications for her multiple health conditions.   Subjective:   Chief complaint: Obesity Jasdeep is here to discuss her progress with her obesity treatment plan. She is on the the Category 1 Plan with breakfast and lunch options and states she is following her eating plan approximately 90% of the time. She states she is exercising 45-60 minutes 6 days per week.  Interval History:  Caragan Mazzola is here for a follow up office visit. This is patient's 39 office visit with [place].    Since last office visit: - Endorses that she felt constipated last week. Also reports  that her feet feels cramped at night. This is likely due to dehydration.  - Due to her GI illness, she did not consume a lot of protein.  - Drinks 6 bottles of water daily.  - Endorses she has not been eating out since last OV.   Pharmacotherapy for weight loss: She is currently taking  Semaglutide and Metformin  for medical weight loss.  Denies side effects.    Review of Systems:  Pertinent positives were addressed with patient today.  Weight Summary and Biometrics   Weight Lost Since Last Visit: 0  Weight Gained Since Last Visit: 3 lb    Vitals Temp: 98.2 F (36.8 C) BP: 119/68 Pulse Rate: 72 SpO2: 95 %   Anthropometric Measurements Height: 5\' 2"  (1.575 m) Weight: 217 lb (98.4 kg) BMI (Calculated): 39.68 Weight at Last Visit: 214 lb Weight Lost Since Last Visit: 0 Weight Gained Since Last Visit: 3 lb Starting  Weight: 240 lb Total Weight Loss (lbs): 23 lb (10.4 kg) Peak Weight: 242 lb   Body Composition  Body Fat %: 51.2 % Fat Mass (lbs): 111.4 lbs Muscle Mass (lbs): 100.8 lbs Total Body Water (lbs): 81.6 lbs Visceral Fat Rating : 16   Other Clinical Data Fasting: No Labs: No Today's Visit #: 39 Starting Date: 04/14/20   Objective:   PHYSICAL EXAM: Blood pressure 119/68, pulse 72, temperature 98.2 F (36.8 C), height 5\' 2"  (1.575 m), weight 217 lb (98.4 kg), SpO2 95 %. Body mass index is 39.69 kg/m.  General: Well Developed, well nourished, and in no acute distress.  HEENT: Normocephalic, atraumatic Skin: Warm and dry, cap RF less 2 sec, good turgor Chest:  Normal excursion, shape, no gross abn Respiratory: speaking in full sentences, no conversational dyspnea NeuroM-Sk: Ambulates w/o assistance, moves * 4 Psych: A and O *3, insight good, mood-full  DIAGNOSTIC DATA REVIEWED:  BMET    Component Value Date/Time   NA 141 11/22/2022 1446   K 4.1 11/22/2022 1446   CL 104 11/22/2022 1446   CO2 23 11/22/2022 1446   GLUCOSE 74 11/22/2022 1446    GLUCOSE 95 11/12/2019 0930   BUN 8 11/22/2022 1446   CREATININE 0.83 11/22/2022 1446   CALCIUM 8.7 11/22/2022 1446   GFRNONAA 91 04/15/2020 1654   GFRAA 105 04/15/2020 1654   Lab Results  Component Value Date   HGBA1C 6.1 (H) 11/22/2022   HGBA1C 6.1 (H) 04/15/2020   Lab Results  Component Value Date   INSULIN 9.1 03/30/2022   INSULIN 21.9 04/15/2020   Lab Results  Component Value Date   TSH 0.95 12/30/2022   CBC    Component Value Date/Time   WBC 7.2 12/30/2022 1429   RBC 5.91 (H) 12/30/2022 1429   HGB 12.7 12/30/2022 1429   HGB 12.5 03/30/2022 1049   HCT 39.6 12/30/2022 1429   HCT 39.5 03/30/2022 1049   PLT 190.0 12/30/2022 1429   PLT 236 03/30/2022 1049   MCV 67.1 Repeated and verified X2. (L) 12/30/2022 1429   MCV 68 (L) 03/30/2022 1049   MCH 21.6 (L) 03/30/2022 1049   MCH 21.7 (L) 09/25/2016 0012   MCHC 32.0 12/30/2022 1429   RDW 17.7 (H) 12/30/2022 1429   RDW 19.0 (H) 03/30/2022 1049   Iron Studies    Component Value Date/Time   IRON 62 12/30/2022 1429   IRON 63 11/03/2021 0942   TIBC 365.4 12/30/2022 1429   TIBC 332 11/03/2021 0942   FERRITIN 38.7 12/30/2022 1429   FERRITIN 72 11/03/2021 0942   IRONPCTSAT 17.0 (L) 12/30/2022 1429   IRONPCTSAT 19 11/03/2021 0942   Lipid Panel     Component Value Date/Time   CHOL 171 11/22/2022 1446   TRIG 78 11/22/2022 1446   HDL 64 11/22/2022 1446   CHOLHDL 2.7 11/22/2022 1446   CHOLHDL 3 11/06/2010 1638   VLDL 20.8 11/06/2010 1638   LDLCALC 92 11/22/2022 1446   LDLDIRECT 96 11/22/2022 1446   Hepatic Function Panel     Component Value Date/Time   PROT 6.0 11/22/2022 1446   ALBUMIN 3.7 (L) 11/22/2022 1446   AST 14 11/22/2022 1446   ALT 17 11/22/2022 1446   ALKPHOS 76 11/22/2022 1446   BILITOT <0.2 11/22/2022 1446   BILIDIR 0.0 01/30/2013 1213      Component Value Date/Time   TSH 0.95 12/30/2022 1429   Nutritional Lab Results  Component Value Date   VD25OH 43.7 11/22/2022   VD25OH 57.3  03/30/2022    VD25OH 30.5 11/03/2021    Attestations:   Reviewed by clinician on day of visit: allergies, medications, problem list, medical history, surgical history, family history, social history, and previous encounter notes.   I,Special Puri,acting as a Neurosurgeon for [employer], DO.,have documented all relevant documentation on the behalf of Thomasene Lot, DO,as directed by  Thomasene Lot, DO while in the presence of Thomasene Lot, DO.   I, Thomasene Lot, DO, have reviewed all documentation for this visit. The documentation on 02/10/23 for the exam, diagnosis, procedures, and orders are all accurate and complete.

## 2023-02-22 ENCOUNTER — Ambulatory Visit: Payer: BC Managed Care – PPO | Attending: Obstetrics and Gynecology | Admitting: Physical Therapy

## 2023-02-22 DIAGNOSIS — R278 Other lack of coordination: Secondary | ICD-10-CM | POA: Diagnosis present

## 2023-02-22 DIAGNOSIS — N393 Stress incontinence (female) (male): Secondary | ICD-10-CM | POA: Diagnosis present

## 2023-02-22 DIAGNOSIS — R2689 Other abnormalities of gait and mobility: Secondary | ICD-10-CM | POA: Insufficient documentation

## 2023-02-22 NOTE — Therapy (Signed)
OUTPATIENT PHYSICAL THERAPY TREATMENT NOTE    Patient Name: Molly Bean MRN: [id number] DOB:1960-11-11, 62 y.o., female Today's Date: 02/22/2023  REFERRING PROVIDER: Florian Buff   PT End of Session - 02/22/23 0848     Visit Number 42    Date for PT Re-Evaluation 03/08/23   PN & recert 06/15/21, 02/03/22, 07/08/22   PT Start Time 0800    PT Stop Time 0845    PT Time Calculation (min) 45 min    Activity Tolerance Patient tolerated treatment well    Behavior During Therapy [facility] for tasks assessed/performed               Past Medical History:  Diagnosis Date   Anxiety    Constipation    DEPRESSION    edema lower extremeties    GERD    Joint pain    MENOPAUSAL SYNDROME    Multiple food allergies    OBESITY    Shortness of breath    Sleep apnea    wears CPAP   Past Surgical History:  Procedure Laterality Date   ABDOMINAL HYSTERECTOMY  11/1998   total fibroids   CESAREAN SECTION     Patient Active Problem List   Diagnosis Date Noted   Other hyperlipidemia 01/04/2023   Other constipation 01/04/2023   BMI 39.0-39.9,adult-current bmi 39.5 01/04/2023   Encounter for general adult medical examination with abnormal findings 12/30/2022   Need for hepatitis C screening test 12/30/2022   Chronic idiopathic constipation 12/30/2022   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   DOE (dyspnea on exertion) 12/30/2022   Dyslipidemia, goal LDL below 130 12/30/2022   Morbid obesity (HCC)-start bmi 43.90 11/25/2022   Axillary hidradenitis suppurativa 07/07/2021   Iron deficiency anemia 12/10/2020   Prediabetes 08/06/2020   Vitamin D deficiency 08/06/2020   Essential hypertension 05/01/2018   Asthma 03/14/2018   Hidradenitis suppurativa 01/18/2018   GAD (generalized anxiety disorder) 10/11/2016   Gastroesophageal reflux disease 04/12/2007    REFERRING DIAG: R32 (ICD-10-CM) - Urinary incontinence, unspecified type  THERAPY DIAG:  Stress incontinence of urine   Other  abnormalities of gait and mobility   Other lack of coordination    Rationale for Evaluation and Treatment Rehabilitation  PERTINENT HISTORY: C-section, abdominal hysterectomy   PRECAUTIONS: N/A  SUBJECTIVE:                Pt reports she has been doing her quick contractions of kegels 3 x day the past month. Pt now can notice when she is leaking whereas before she did not. Pt also noticed she is not leaking when on her walks. Pt continues to walking routine. Pt noticed leaking most when she is getting out of bed.   PAIN:  Are you having pain? No   [facility] PT Assessment - 02/22/23 0957       Other:   Other/ Comments step back exercise with poor knee alingment             Pelvic Floor Special Questions - 02/22/23 0959     External Perineal Exam Through clothing: hooklying 3 sec, 5 reps, seated: 7 reps              [facility] Adult PT Treatment/Exercise - 02/22/23 0958       Therapeutic Activites    Other Therapeutic Activities explained short and long contractions kegels into progrma, explained deep core HEP to continue      Neuro Re-ed    Neuro Re-ed Details  excessive cues for propioception of  back foot and front knee alignment for more stbaility and minimize knee pain, cued for longcontractions of kegels              PATIENT EDUCATION: Education details: withhold long contractions, focus on not letting abdominal mm overuse    Person educated: Patient Education method: Explanation, Demonstration, Tactile cues, Verbal cues, and Handouts Education comprehension: verbalized understanding, returned demonstration, verbal cues required, tactile cues required, and needs further education    HOME EXERCISE PROGRAM:                        See pt instructions       PT Long Term Goals -       PT LONG TERM GOAL #1   Title Pt will demo increased hip abduction strength from 3/5 to >4/5 in order to maintain walking routine and pelvic stability    Time 8    Period  Weeks    Status Achieved    Target Date 02/03/22      PT LONG TERM GOAL #2   Title Pt will demo proper deepcore coordination and pelvic floor movement with no cues to progress to kegel exercises    Time 4    Period Weeks    Status Achieved    Target Date 02/03/22      PT LONG TERM GOAL #3   Title Pt will demo decreased abdominal scar restrictions to advance to deep core exercises and promote IAP for continence and bowel movement elimination with less dependence on Linzess    Time 4    Period Weeks    Status Achieved    Target Date 02/03/22      PT LONG TERM GOAL #4   Title Pt will increase FOTO score for constipation from 41 pts and Urinary 47 pts to > 55 pts in order to demo improved pelvic floor function    ( 06/15/21: no change urinary, 2 pt change improvement  with bowel function,    11/25/21: neg scores of change, need to read[place] because pt has reported improvements,   04/21/22: constipation 6 pt change, urinary -2 pt change,      07/08/22: constipation constipation 2pt,  urinary 0 pts) )   11/30/22: assess at next session    Time 10    Period Weeks    Status On-going    Target Date 12/24/2022     PT LONG TERM GOAL #5   Title Pt will demo decreased pelvic floor mm tightness and proper relaxation of mm to have less pain with intercourcse and gynecological exams    Time 10    Period Weeks    Status Achieved    Target Date 02/03/22      PT LONG TERM GOAL #6   Title Pt will demo no pelvic floor tightness/ tendernessacorss 2 sessions, demo proper quick contractions > 5 reps and long holds 3sec, 5 reps without cues ino rder to gain continence    Time 10   Period Weeks    Status Ongoing    Target Date 03/08/2023         PT LONG TERM GOAL #7   Title Pt will report dry pad with walking 0.5 miles   in order to be continence with walking routine    Time 12   Period Weeks    Status On-going    Target Date 03/08/23           PT LONG TERM GOAL #  8  Title Pt will  perform fitness exercises with proper alignment and technique to minimize overactivity of pelvic floor and incontinence   Time 10   Period Weeks   Status MET   Target Date 12/24/2022          Plan -     Clinical Impression Statement Pt reports she has been doing her quick contractions of kegels 3 x day the past month. Pt now can notice when she is leaking whereas before she did not. Pt also noticed she is not leaking when on her walks. Pt continues to walking routine. Pt noticed leaking most when she is getting out of bed.    Progressed to long kegel contractions in hooklying and short contractions in seated position today. Pt required cues for technique and demo'd correctly  Cued for deep core HEP to resume.   Excessive cues for propioception of back foot and front knee alignment for more stability and minimize knee pain.   Anticipate pt will achieve remaining goals with monthly visits.   Pt continues to benefit from skilled PT .     Examination-Activity Limitations Toileting;Continence;Other    Stability/Clinical Decision Making Evolving/Moderate complexity    Rehab Potential Good    PT Frequency 1x / month   PT Duration Other (comment)  02/23/2023     PT Treatment/Interventions Neuromuscular re-education;Cryotherapy;ADLs/Self Care Home Management;Therapeutic activities;Functional mobility training;Traction;Moist Heat;Stair training;Gait training;Patient/family education;Taping;Manual techniques;Scar mobilization;Balance training;Therapeutic exercise;Spinal Manipulations;Joint Manipulations;Energy conservation    Consulted and Agree with Plan of Care Patient            Mariane Masters, PT 02/22/2023, 8:48 AM

## 2023-02-22 NOTE — Patient Instructions (Signed)
  Exercise check off   Mon Tue Wed Thurs Fri  Sat Sun  Long holds   5 reps , 3 sec, 2 rest breaths             Deep core  level 1  ( 10 breaths)   Level 2 ( 6 mins)             Long holds   5 reps , 3 sec, 2 rest breaths               When seated, hands press by side on chair to squeeze shoulders back and down, chin tuck Rock pelvic forward slightly to find anterior tilt of pelvis for optimal lengthening of pelvic floor    BREAKFAST< LUNCH< DINNER   7 reps in a chair -quick contractions    __    Step back opp arm exercise:  Front knee above ankle, back foot further back and aligned with hip width

## 2023-03-10 ENCOUNTER — Ambulatory Visit (INDEPENDENT_AMBULATORY_CARE_PROVIDER_SITE_OTHER): Payer: BC Managed Care – PPO | Admitting: Family Medicine

## 2023-03-31 ENCOUNTER — Encounter (INDEPENDENT_AMBULATORY_CARE_PROVIDER_SITE_OTHER): Payer: Self-pay | Admitting: Family Medicine

## 2023-03-31 ENCOUNTER — Ambulatory Visit (INDEPENDENT_AMBULATORY_CARE_PROVIDER_SITE_OTHER): Payer: BC Managed Care – PPO | Admitting: Family Medicine

## 2023-03-31 VITALS — BP 135/64 | HR 71 | Temp 98.9°F | Ht 62.0 in | Wt 211.0 lb

## 2023-03-31 DIAGNOSIS — K5909 Other constipation: Secondary | ICD-10-CM

## 2023-03-31 DIAGNOSIS — R7303 Prediabetes: Secondary | ICD-10-CM | POA: Diagnosis not present

## 2023-03-31 DIAGNOSIS — Z6839 Body mass index (BMI) 39.0-39.9, adult: Secondary | ICD-10-CM

## 2023-03-31 DIAGNOSIS — Z6838 Body mass index (BMI) 38.0-38.9, adult: Secondary | ICD-10-CM

## 2023-03-31 DIAGNOSIS — E559 Vitamin D deficiency, unspecified: Secondary | ICD-10-CM | POA: Diagnosis not present

## 2023-03-31 MED ORDER — SEMAGLUTIDE (2 MG/DOSE) 8 MG/3ML ~~LOC~~ SOPN
2.0000 mg | PEN_INJECTOR | SUBCUTANEOUS | 1 refills | Status: DC
Start: 2023-03-31 — End: 2023-05-16

## 2023-03-31 MED ORDER — METFORMIN HCL 500 MG PO TABS: 500.0000 mg | ORAL_TABLET | Freq: Two times a day (BID) | ORAL | 1 refills | Status: DC

## 2023-03-31 MED ORDER — VITAMIN D (ERGOCALCIFEROL) 1.25 MG (50000 UNIT) PO CAPS
ORAL_CAPSULE | ORAL | 1 refills | Status: DC
Start: 2023-03-31 — End: 2023-05-16

## 2023-03-31 MED ORDER — LINACLOTIDE 145 MCG PO CAPS
ORAL_CAPSULE | ORAL | 1 refills | Status: DC
Start: 2023-03-31 — End: 2023-05-16

## 2023-03-31 NOTE — Progress Notes (Signed)
Molly Bean, D.O.  ABFM, ABOM Specializing in Clinical Bariatric Medicine  Office located at: [address] [place], [place]  [address]     Assessment and Plan:   Orders Placed This Encounter  Procedures   VITAMIN D 25 Hydroxy (Vit-D Deficiency, Fractures)   Hemoglobin A1c   Insulin, random   Basic metabolic panel   Vitamin B12   Magnesium    Medications Discontinued During This Encounter  Medication Reason   metFORMIN (GLUCOPHAGE) 500 MG tablet Reorder   Vitamin D, Ergocalciferol, (DRISDOL) 1.25 MG (50000 UNIT) CAPS capsule Reorder   Semaglutide, 2 MG/DOSE, 8 MG/3ML SOPN Reorder   linaclotide (LINZESS) 145 MCG CAPS capsule Reorder     Meds ordered this encounter  Medications   Vitamin D, Ergocalciferol, (DRISDOL) 1.25 MG (50000 UNIT) CAPS capsule    Sig: 1 po q wed, and 1 po q sun    Dispense:  8 capsule    Refill:  1    Ov for rf   Semaglutide, 2 MG/DOSE, 8 MG/3ML SOPN    Sig: Inject 2 mg as directed once a week.    Dispense:  3 mL    Refill:  1   metFORMIN (GLUCOPHAGE) 500 MG tablet    Sig: Take 1 tablet (500 mg total) by mouth 2 (two) times daily with a meal.    Dispense:  60 tablet    Refill:  1    Ov for rf   linaclotide (LINZESS) 145 MCG CAPS capsule    Sig: 2 po qd    Dispense:  60 capsule    Refill:  1     Vitamin D deficiency Assessment: Her Vitamin D def is being treated with Ergocalciferol 50K IU twice weekly and endorses tolerating supplement well. No concerns.   Lab Results  Component Value Date   VD25OH 43.7 11/22/2022   VD25OH 57.3 03/30/2022   VD25OH 30.5 11/03/2021   Plan: Recheck labs. Continue with prescribed supplement at current dose. Will refill ERGO today.    Weight loss will likely improve availability of vitamin D, thus encouraged Israa to continue with meal plan and their weight loss efforts to further improve this condition.  Thus, we will need to monitor levels regularly (every 3-4 mo on average) to keep levels  within normal limits and prevent over supplementation.   Pre-diabetes Assessment: No issues with Metformin 500 mg twice daily and Semaglutide 2 mg once weekly. Denies any GI upset. She endorses that her cravings and hunger have been well controlled; in fact, she states feeling quite full with the Semaglutide. She is able to eat all the food on the meal plan.   Lab Results  Component Value Date   HGBA1C 6.1 (H) 11/22/2022   HGBA1C 5.7 (H) 03/30/2022   HGBA1C 5.7 (H) 11/03/2021   INSULIN 9.1 03/30/2022   INSULIN 22.5 11/03/2021   INSULIN 21.1 03/23/2021    Plan: Recheck labs. Continue with both prediabetes medications. Will refill Metformin and Semaglutide today.    Continue her prudent nutritional plan that is low in simple carbohydrates, saturated fats and trans fats to goal of 5-10% weight loss to achieve significant health benefits.  Pt encouraged to continually advance exercise and cardiovascular fitness as tolerated throughout weight loss journey. Will continue to monitor condition alongside her PCP.    Other constipation Assessment: Condition is improving. Pt endorses having one normal caliber bowel movement each day. She states increasing her exercise regiment and her water intake to 7  bottles per day may be helping with her improvement in symptoms. No issues with Linzess 145 mcg 2 po every day.   Plan: Recheck labs. Pt advised to maintain with Linzess at current dose. Will refill today.    Unless pre-existing renal or cardiopulmonary conditions exist which patient was told to limit their fluid intake by another provider, I recommended roughly one half of their weight in pounds, to be the approximate ounces of non-caloric, non-caffeinated beverages they should drink per day; including more if they are engaging in exercise.   Eat plenty of fiber (goal is over 25 grams each day).  It is best to get most of your fiber from dietary sources which includes leafy green vegetables, fresh  fruit, and whole grains. Will continue to monitor condition closely.     TREATMENT PLAN FOR OBESITY: BMI 39.0-39.9,adult-current bmi 38.58 Morbid obesity (HCC)-start bmi 43.90 Assessment: [name] is here to discuss her progress with her obesity treatment plan along with follow-up of her obesity related diagnoses. See Medical Weight Management Flowsheet for complete bioelectrical impedance results.  Condition is improving.Biometric data collected today, was reviewed with patient.   Since last office visit on 02/10/23 patient's  Muscle mass has increased by 4.8 lb. Fat mass has decreased by 10.6 lb. Total body water has decreased by 7.6 lb.  Counseling done on how various foods will affect these numbers and how to maximize success  Total lbs lost to date: 29  Total weight loss percentage to date: 12.08    Plan: Continue the Category 1 meal plan with breakfast and lunch options.   Behavioral Intervention Additional resources provided today: patient declined Evidence-based interventions for health behavior change were utilized today including the discussion of self monitoring techniques, problem-solving barriers and SMART goal setting techniques.   Regarding patient's less desirable eating habits and patterns, we employed the technique of small changes.  Pt will specifically work on: continue with prescribed meal plan/current exercise regiment  and increase water to 8 bottles daily for next visit.    Recommended Physical Activity Goals  Carlena has been advised to slowly work up to 150 minutes of moderate intensity aerobic activity a week and strengthening exercises 2-3 times per week for cardiovascular health, weight loss maintenance and preservation of muscle mass.   She has agreed to Continue current level of physical activity   FOLLOW UP: Return in about 7 weeks (around 05/19/2023). She was informed of the importance of frequent follow up visits to maximize her success with  intensive lifestyle modifications for her multiple health conditions  Onell Sarchet is aware that we will review all of her lab results at our next visit.  She is aware that if anything is critical/ life threatening with the results, we will be contacting her via MyChart prior to the office visit to discuss management.    Subjective:   Chief complaint: Obesity Titiana is here to discuss her progress with her obesity treatment plan. She is on the the Category 1 Plan  with breakfast and lunch options and states she is following her eating plan approximately 95% of the time. She states she is going to the gym 60 minutes 5 days per week.  Interval History:  Mieshia Pepitone is here for a follow up office visit. Since last OV, Mrs. Unterreiner has been doing well. Apart from going to the gym, she is also walking 30 minutes, 5 days a wk. She has been drinking 7 bottles of water daily. 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 Her hunger and cravings are well controlled on medications and when following the meal plan.   Pharmacotherapy for weight loss: She is currently taking  Semaglutide and Metformin  for medical weight loss.  Denies side effects.    Review of Systems:  Pertinent positives were addressed with patient today.  Reviewed by clinician on day of visit: allergies, medications, problem list, medical history, surgical history, family history, social history, and previous encounter notes.  Weight Summary and Biometrics   Weight Lost Since Last Visit: 6lb  Weight Gained Since Last Visit: 0lb    Vitals Temp: 98.9 F (37.2 C) BP: 135/64 Pulse Rate: 71 SpO2: 98 %   Anthropometric Measurements Height: 5\' 2"  (1.575 m) Weight: 211 lb (95.7 kg) BMI (Calculated): 38.58 Weight at Last Visit: 217lb Weight Lost Since Last Visit: 6lb Weight Gained Since Last Visit: 0lb Starting Weight: 240lb Total Weight Loss (lbs): 29 lb (13.2 kg) Peak  Weight: 242lb   Body Composition  Body Fat %: 47.6 % Fat Mass (lbs): 100.8 lbs Muscle Mass (lbs): 105.4 lbs Total Body Water (lbs): 74 lbs Visceral Fat Rating : 15   Other Clinical Data Fasting: no Labs: no Today's Visit #: 40 Starting Date: 04/14/20   Objective:   PHYSICAL EXAM: Blood pressure 135/64, pulse 71, temperature 98.9 F (37.2 C), height 5\' 2"  (1.575 m), weight 211 lb (95.7 kg), SpO2 98 %. Body mass index is 38.59 kg/m.  General: Well Developed, well nourished, and in no acute distress.  HEENT: Normocephalic, atraumatic Skin: Warm and dry, cap RF less 2 sec, good turgor Chest:  Normal excursion, shape, no gross abn Respiratory: speaking in full sentences, no conversational dyspnea NeuroM-Sk: Ambulates w/o assistance, moves * 4 Psych: A and O *3, insight good, mood-full  DIAGNOSTIC DATA REVIEWED:  BMET    Component Value Date/Time   NA 141 11/22/2022 1446   K 4.1 11/22/2022 1446   CL 104 11/22/2022 1446   CO2 23 11/22/2022 1446   GLUCOSE 74 11/22/2022 1446   GLUCOSE 95 11/12/2019 0930   BUN 8 11/22/2022 1446   CREATININE 0.83 11/22/2022 1446   CALCIUM 8.7 11/22/2022 1446   GFRNONAA 91 04/15/2020 1654   GFRAA 105 04/15/2020 1654   Lab Results  Component Value Date   HGBA1C 6.1 (H) 11/22/2022   HGBA1C 6.1 (H) 04/15/2020   Lab Results  Component Value Date   INSULIN 9.1 03/30/2022   INSULIN 21.9 04/15/2020   Lab Results  Component Value Date   TSH 0.95 12/30/2022   CBC    Component Value Date/Time   WBC 7.2 12/30/2022 1429   RBC 5.91 (H) 12/30/2022 1429   HGB 12.7 12/30/2022 1429   HGB 12.5 03/30/2022 1049   HCT 39.6 12/30/2022 1429   HCT 39.5 03/30/2022 1049   PLT 190.0 12/30/2022 1429   PLT 236 03/30/2022 1049   MCV 67.1 Repeated and verified X2. (L) 12/30/2022 1429   MCV 68 (L) 03/30/2022 1049   MCH 21.6 (L) 03/30/2022 1049   MCH 21.7 (L) 09/25/2016 0012   MCHC 32.0 12/30/2022 1429   RDW 17.7 (H) 12/30/2022 1429   RDW 19.0  (H) 03/30/2022 1049   Iron Studies    Component Value Date/Time   IRON 62 12/30/2022 1429   IRON 63 11/03/2021 0942   TIBC 365.4 12/30/2022 1429   TIBC 332 11/03/2021 0942   FERRITIN 38.7 12/30/2022 1429   FERRITIN 72 11/03/2021 0942   IRONPCTSAT  17.0 (L) 12/30/2022 1429   IRONPCTSAT 19 11/03/2021 0942   Lipid Panel     Component Value Date/Time   CHOL 171 11/22/2022 1446   TRIG 78 11/22/2022 1446   HDL 64 11/22/2022 1446   CHOLHDL 2.7 11/22/2022 1446   CHOLHDL 3 11/06/2010 1638   VLDL 20.8 11/06/2010 1638   LDLCALC 92 11/22/2022 1446   LDLDIRECT 96 11/22/2022 1446   Hepatic Function Panel     Component Value Date/Time   PROT 6.0 11/22/2022 1446   ALBUMIN 3.7 (L) 11/22/2022 1446   AST 14 11/22/2022 1446   ALT 17 11/22/2022 1446   ALKPHOS 76 11/22/2022 1446   BILITOT <0.2 11/22/2022 1446   BILIDIR 0.0 01/30/2013 1213      Component Value Date/Time   TSH 0.95 12/30/2022 1429   Nutritional Lab Results  Component Value Date   VD25OH 43.7 11/22/2022   VD25OH 57.3 03/30/2022   VD25OH 30.5 11/03/2021    Attestations:   I, Special Puri, acting as a Stage manager for [employer], DO., have compiled all relevant documentation for today's office visit on behalf of Thomasene Lot, DO, while in the presence of [employer], DO.  I have reviewed the above documentation for accuracy and completeness, and I agree with the above. Molly Bean, D.O.  The 21st Century Cures Act was signed into law in 2016 which includes the topic of electronic health records.  This provides immediate access to information in MyChart.  This includes consultation notes, operative notes, office notes, lab results and pathology reports.  If you have any questions about what you read please let us know at your next visit so we can discuss your concerns and take corrective action if need be.  We are right here with you.

## 2023-04-01 LAB — BASIC METABOLIC PANEL
BUN/Creatinine Ratio: 12 (ref 12–28)
BUN: 11 mg/dL (ref 8–27)
CO2: 25 mmol/L (ref 20–29)
Calcium: 9.8 mg/dL (ref 8.7–10.3)
Chloride: 105 mmol/L (ref 96–106)
Creatinine, Ser: 0.89 mg/dL (ref 0.57–1.00)
Glucose: 85 mg/dL (ref 70–99)
Potassium: 4.5 mmol/L (ref 3.5–5.2)
Sodium: 145 mmol/L — ABNORMAL HIGH (ref 134–144)
eGFR: 73 mL/min/{1.73_m2} (ref >59)

## 2023-04-01 LAB — INSULIN, RANDOM: INSULIN: 28.8 u[IU]/mL — ABNORMAL HIGH (ref 2.6–24.9)

## 2023-04-01 LAB — HEMOGLOBIN A1C
Est. average glucose Bld gHb Est-mCnc: 120 mg/dL
Hgb A1c MFr Bld: 5.8 % — ABNORMAL HIGH (ref 4.8–5.6)

## 2023-04-01 LAB — VITAMIN B12: Vitamin B-12: 466 pg/mL (ref 232–1245)

## 2023-04-01 LAB — VITAMIN D 25 HYDROXY (VIT D DEFICIENCY, FRACTURES): Vit D, 25-Hydroxy: 63.6 ng/mL (ref 30.0–100.0)

## 2023-04-01 LAB — MAGNESIUM: Magnesium: 2.2 mg/dL (ref 1.6–2.3)

## 2023-04-04 ENCOUNTER — Encounter: Admitting: Physical Therapy

## 2023-04-21 ENCOUNTER — Ambulatory Visit: Payer: BC Managed Care – PPO | Attending: Obstetrics and Gynecology | Admitting: Physical Therapy

## 2023-04-21 DIAGNOSIS — N393 Stress incontinence (female) (male): Secondary | ICD-10-CM | POA: Insufficient documentation

## 2023-04-21 DIAGNOSIS — R278 Other lack of coordination: Secondary | ICD-10-CM | POA: Insufficient documentation

## 2023-04-21 DIAGNOSIS — R2689 Other abnormalities of gait and mobility: Secondary | ICD-10-CM | POA: Diagnosis present

## 2023-04-21 NOTE — Patient Instructions (Addendum)
Use hands by hips, pressed down, , slight leaning back when scooting off bed to minimize leakage    __  Pay attention to front knee when doing skiing exercise  __ Pay attention to not locking knees and want to keep belly slight forward in band exerise at the door way when walking backwards   __  Heel raise with not locked knees 10-15 reps  Hand on counter   __  Not locked knees standing , spread toes

## 2023-04-21 NOTE — Therapy (Signed)
OUTPATIENT PHYSICAL THERAPY TREATMENT NOTE / Recert    Patient Name: Molly Bean MRN: [id number] DOB:[birth year], 62 y.o., female Today's Date: 04/21/2023  REFERRING PROVIDER: Florian Buff   PT End of Session - 04/21/23 1503     Visit Number 43    Date for PT Re-Evaluation 06/30/23   PN & recert 06/15/21, 02/03/22, 07/08/22   PT Start Time 1500    PT Stop Time 1545    PT Time Calculation (min) 45 min    Activity Tolerance Patient tolerated treatment well    Behavior During Therapy [facility] for tasks assessed/performed               Past Medical History:  Diagnosis Date   Anxiety    Constipation    DEPRESSION    edema lower extremeties    GERD    Joint pain    MENOPAUSAL SYNDROME    Multiple food allergies    OBESITY    Shortness of breath    Sleep apnea    wears CPAP   Past Surgical History:  Procedure Laterality Date   ABDOMINAL HYSTERECTOMY  11/1998   total fibroids   CESAREAN SECTION     Patient Active Problem List   Diagnosis Date Noted   Other hyperlipidemia 01/04/2023   Other constipation 01/04/2023   BMI 39.0-39.9,adult-current bmi 39.5 01/04/2023   Encounter for general adult medical examination with abnormal findings 12/30/2022   Need for hepatitis C screening test 12/30/2022   Chronic idiopathic constipation 12/30/2022   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   DOE (dyspnea on exertion) 12/30/2022   Dyslipidemia, goal LDL below 130 12/30/2022   Morbid obesity (HCC)-start bmi 43.90 11/25/2022   Axillary hidradenitis suppurativa 07/07/2021   Iron deficiency anemia 12/10/2020   Prediabetes 08/06/2020   Vitamin D deficiency 08/06/2020   Essential hypertension 05/01/2018   Asthma 03/14/2018   Hidradenitis suppurativa 01/18/2018   GAD (generalized anxiety disorder) 10/11/2016   Gastroesophageal reflux disease 04/12/2007    REFERRING DIAG: R32 (ICD-10-CM) - Urinary incontinence, unspecified type  THERAPY DIAG:  Stress incontinence of urine   Other  abnormalities of gait and mobility   Other lack of coordination    Rationale for Evaluation and Treatment Rehabilitation  PERTINENT HISTORY: C-section, abdominal hysterectomy   PRECAUTIONS: N/A  SUBJECTIVE:                Pt reports she has been doing her quick contractions of kegels 10 reps  3 x day and 5 reps of 3 sec  long holds on her back.  Leakage has improved and pads are only 40 % wet with walking compared 100% wet. Pt has continued to walking 1 miles a day.  Pt is now getting up once a night to pee instead of 3 x a night.   Since January, pt lost 30lbs.  Since 2020, pt lost 40 lbs.     Pt still noticed leaking most when she is getting out of bed.   PAIN:  Are you having pain? No      [facility] PT Assessment - 04/21/23 1513       Observation/Other Assessments   Observations hyperextended knees in SLS, standing, in past HEP,      Coordination   Coordination and Movement Description poor front knee alignment in ski track , opp scaption exercise      Strength   Overall Strength Comments PF 10 reps unilateral UE support, hyperextended knees  [facility] Adult PT Treatment/Exercise -       Therapeutic Activites    Other Therapeutic Activities Problem solved her issue with leakage when getting out of bed with cue co activation of deep core with logrolling to sit, use of posterior upper kinetic chain for support as pt scoots off bed to minimixe spinal flexion/ downward pressure onto pelvic floor/ minimize leakage      Neuro Re-ed    Neuro Re-ed Details  excessive cues for less hyperextended knees in standing, HEP,             PATIENT EDUCATION: Education details: withhold long contractions, focus on not letting abdominal mm overuse    Person educated: Patient Education method: Explanation, Demonstration, Tactile cues, Verbal cues, and Handouts Education comprehension: verbalized understanding, returned demonstration, verbal cues required, tactile  cues required, and needs further education    HOME EXERCISE PROGRAM:                        See pt instructions       PT Long Term Goals -       PT LONG TERM GOAL #1   Title Pt will demo increased hip abduction strength from 3/5 to >4/5 in order to maintain walking routine and pelvic stability    Time 8    Period Weeks    Status Achieved    Target Date 02/03/22      PT LONG TERM GOAL #2   Title Pt will demo proper deepcore coordination and pelvic floor movement with no cues to progress to kegel exercises    Time 4    Period Weeks    Status Achieved    Target Date 02/03/22      PT LONG TERM GOAL #3   Title Pt will demo decreased abdominal scar restrictions to advance to deep core exercises and promote IAP for continence and bowel movement elimination with less dependence on Linzess    Time 4    Period Weeks    Status Achieved    Target Date 02/03/22      PT LONG TERM GOAL #4   Title Pt will increase FOTO score for constipation from 41 pts and Urinary 47 pts to > 55 pts in order to demo improved pelvic floor function    ( 06/15/21: no change urinary, 2 pt change improvement  with bowel function,    11/25/21: neg scores of change, need to readminster because pt has reported improvements,   04/21/22: constipation 6 pt change, urinary -2 pt change,      07/08/22: constipation constipation 2pt,  urinary 0 pts) )   11/30/22: assess at next session    Time 10    Period Weeks    Status On-going    Target Date 12/24/2022     PT LONG TERM GOAL #5   Title Pt will demo decreased pelvic floor mm tightness and proper relaxation of mm to have less pain with intercourcse and gynecological exams    Time 10    Period Weeks    Status Achieved    Target Date 02/03/22      PT LONG TERM GOAL #6   Title Pt will demo no pelvic floor tightness/ tendernessacorss 2 sessions, demo proper quick contractions > 5 reps and long holds 3sec, 5 reps without cues ino rder to gain continence    Time  10   Period Weeks    Status MET    Target Date  03/08/2023         PT LONG TERM GOAL #7   Title Pt will report dry pad with walking 0.5 miles   in order to be continence with walking routine    Time 10   Period Weeks    Status On-going    Target Date 06/30/2023        PT LONG TERM GOAL #8  Title Pt will perform fitness exercises with proper alignment and technique to minimize overactivity of pelvic floor and incontinence   Time 10   Period Weeks   Status MET   Target Date 12/24/2022          Plan -     Clinical Impression Statement   Pt's improvements include:   Leakage has improved and pads are only 40 % wet with walking compared 100% wet.  Pt has continued to walking 1 miles a day.   Pt is now getting up once a night to pee instead of 3 x a night.   Provided cues for no more hyperextended knees in standing, HEP, heel raise exercise.  Problem solved why pt noticed leakage when getting out of her high bed. Pt showed spinal flexion which caused loaded pressure on full bladder. Pt demo'd improved technique with use of lats mm and use of BUE to scoot off tall bed.    Cued for past HEP and pt required minor cues for front knee alignment.     Anticipate pt will achieve remaining goal  which is related leakage when getting out of bed.   Pt continues to benefit from skilled PT .     Examination-Activity Limitations Toileting;Continence;Other    Stability/Clinical Decision Making Evolving/Moderate complexity    Rehab Potential Good    PT Frequency 1x / month   PT Duration Other (comment)  02/23/2023     PT Treatment/Interventions Neuromuscular re-education;Cryotherapy;ADLs/Self Care Home Management;Therapeutic activities;Functional mobility training;Traction;Moist Heat;Stair training;Gait training;Patient/family education;Taping;Manual techniques;Scar mobilization;Balance training;Therapeutic exercise;Spinal Manipulations;Joint Manipulations;Energy conservation     Consulted and Agree with Plan of Care Patient            Mariane Masters, PT 04/21/2023, 3:04 PM

## 2023-05-16 ENCOUNTER — Encounter (INDEPENDENT_AMBULATORY_CARE_PROVIDER_SITE_OTHER): Payer: Self-pay | Admitting: Family Medicine

## 2023-05-16 ENCOUNTER — Ambulatory Visit (INDEPENDENT_AMBULATORY_CARE_PROVIDER_SITE_OTHER): Payer: BC Managed Care – PPO | Admitting: Family Medicine

## 2023-05-16 VITALS — BP 121/76 | HR 72 | Temp 97.6°F | Ht 62.0 in | Wt 225.0 lb

## 2023-05-16 DIAGNOSIS — K5909 Other constipation: Secondary | ICD-10-CM | POA: Diagnosis not present

## 2023-05-16 DIAGNOSIS — R7303 Prediabetes: Secondary | ICD-10-CM

## 2023-05-16 DIAGNOSIS — E559 Vitamin D deficiency, unspecified: Secondary | ICD-10-CM | POA: Diagnosis not present

## 2023-05-16 DIAGNOSIS — Z6841 Body Mass Index (BMI) 40.0 and over, adult: Secondary | ICD-10-CM

## 2023-05-16 DIAGNOSIS — Z6839 Body mass index (BMI) 39.0-39.9, adult: Secondary | ICD-10-CM

## 2023-05-16 MED ORDER — LINACLOTIDE 145 MCG PO CAPS: ORAL_CAPSULE | ORAL | 1 refills | Status: DC

## 2023-05-16 MED ORDER — METFORMIN HCL 500 MG PO TABS: 500.0000 mg | ORAL_TABLET | Freq: Two times a day (BID) | ORAL | 1 refills | Status: DC

## 2023-05-16 MED ORDER — VITAMIN D (ERGOCALCIFEROL) 1.25 MG (50000 UNIT) PO CAPS: ORAL_CAPSULE | ORAL | 1 refills | Status: DC

## 2023-05-16 MED ORDER — SEMAGLUTIDE (2 MG/DOSE) 8 MG/3ML ~~LOC~~ SOPN
2.0000 mg | PEN_INJECTOR | SUBCUTANEOUS | 1 refills | Status: DC
Start: 2023-05-16 — End: 2023-07-18

## 2023-05-16 NOTE — Progress Notes (Signed)
Molly Bean, D.O.  ABFM, ABOM Specializing in Clinical Bariatric Medicine  Office located at: [address] [place], [place]  [address]     Assessment and Plan:   Medications Discontinued During This Encounter  Medication Reason   Vitamin D, Ergocalciferol, (DRISDOL) 1.25 MG (50000 UNIT) CAPS capsule Reorder   Semaglutide, 2 MG/DOSE, 8 MG/3ML SOPN Reorder   metFORMIN (GLUCOPHAGE) 500 MG tablet Reorder   linaclotide (LINZESS) 145 MCG CAPS capsule Reorder     Meds ordered this encounter  Medications   linaclotide (LINZESS) 145 MCG CAPS capsule    Sig: 2 po qd    Dispense:  60 capsule    Refill:  1   metFORMIN (GLUCOPHAGE) 500 MG tablet    Sig: Take 1 tablet (500 mg total) by mouth 2 (two) times daily with a meal.    Dispense:  60 tablet    Refill:  1    Ov for rf   Semaglutide, 2 MG/DOSE, 8 MG/3ML SOPN    Sig: Inject 2 mg as directed once a week.    Dispense:  3 mL    Refill:  1   Vitamin D, Ergocalciferol, (DRISDOL) 1.25 MG (50000 UNIT) CAPS capsule    Sig: 1 po q wed, and 1 po q sun    Dispense:  8 capsule    Refill:  1    Ov for rf   Pre-diabetes Assessment: Condition is Not at goal.. Her A1c improved from 6.1 to 5.8 however, her insulin levels increased from 9.1 to 28.8. She informed me that she was not fasting for labs that day. Pt has been compliant with Metformin but has been off of her Semagltuide due to forgetting to pick up her refill. She denies any GI upset or adverse side effects. She endorses that her hunger and cravings are well controlled.  Lab Results  Component Value Date   HGBA1C 5.8 (H) 03/31/2023   HGBA1C 6.1 (H) 11/22/2022   HGBA1C 5.7 (H) 03/30/2022   INSULIN 28.8 (H) 03/31/2023   INSULIN 9.1 03/30/2022   INSULIN 22.5 11/03/2021   Lab Results  Component Value Date   CREATININE 0.89 03/31/2023   BUN 11 03/31/2023   NA 145 (H) 03/31/2023   K 4.5 03/31/2023   CL 105 03/31/2023   CO2 25 03/31/2023      Component Value  Date/Time   PROT 6.0 11/22/2022 1446   ALBUMIN 3.7 (L) 11/22/2022 1446   AST 14 11/22/2022 1446   ALT 17 11/22/2022 1446   ALKPHOS 76 11/22/2022 1446   BILITOT <0.2 11/22/2022 1446   BILIDIR 0.0 01/30/2013 1213    Component Ref Range & Units 03/31/2023  Magnesium 1.6 - 2.3 mg/dL 2.2   Plan: - Continue Metformin 500mg  twice daily and Semaglutide 2mg  once weekly. I will refill today.   - Continue to decrease simple carbs/ sugars; increase fiber and proteins -> follow her meal plan.  Lifestyle changes such as following our low salt, heart healthy meal plan and engaging in a regular exercise program discussed.   - Anticipatory guidance given.    - Molly Bean will continue to work on weight loss, exercise, via their meal plan we devised to help decrease the risk of progressing to diabetes.   - We will recheck A1c and fasting insulin level in approximately 3 months from last check, or as deemed appropriate.   - Labs were reviewed with patient today and education provided on them. We discussed how the foods patient  eats may influence these laboratory findings.  All of the patient's questions about them were answered    Vitamin D deficiency Assessment: Condition is Controlled. Her vitamin D levels are well controlled with ERGO once weekly. Pt endorses being compliant and tolerates this with no adverse side effects.  Lab Results  Component Value Date   VD25OH 63.6 03/31/2023   VD25OH 43.7 11/22/2022   VD25OH 57.3 03/30/2022   Lab Results  Component Value Date   VITAMINB12 466 03/31/2023   Plan: - Continue Ergocalciferol 50K IU weekly. I will refill today.   - Continue her prudent nutritional plan that is low in simple carbohydrates, saturated fats and trans fats to goal of 5-10% weight loss to achieve significant health benefits.  Pt encouraged to continually advance exercise and cardiovascular fitness as tolerated throughout weight loss journey.  - We will need to monitor levels  regularly (every 3-4 mo on average) to keep levels within normal limits and prevent over supplementation.  - Labs were reviewed with patient today and education provided on them. We discussed how the foods patient eats may influence these laboratory findings.  All of the patient's questions about them were answered    Other constipation Assessment: Condition is Controlled.. She continues to have normal bowel movements with Linzess and tolerates this without difficulty or any adverse side effects.   Plan: - Continue with Linzess. I will refill today.   - Unless pre-existing renal or cardiopulmonary conditions exist which patient was told to limit their fluid intake by another provider, I recommended roughly one half of their weight in pounds, to be the approximate ounces of non-caloric, non-caffeinated beverages they should drink per day; including more if they are engaging in exercise.   TREATMENT PLAN FOR OBESITY: BMI 39.0-39.9,adult-current bmi 41.14 Morbid obesity (HCC)-start bmi 43.90 Assessment:  Molly Bean is here to discuss her progress with her obesity treatment plan along with follow-up of her obesity related diagnoses. See Medical Weight Management Flowsheet for complete bioelectrical impedance results.  Condition is not optimized. Biometric data collected today, was reviewed with patient.   Since last office visit on 06/27/20244 patient's  Muscle mass has increased by 3lb. Fat mass has increased by 10.4lb. Total body water has increased by 5lb.  Counseling done on how various foods will affect these numbers and how to maximize success  Total lbs lost to date: 15lbs Total weight loss percentage to date: 6.25%   Plan: - Continue to follow the Category 1 meal plan and Begin journaling her food intake.   Behavioral Intervention Additional resources provided today: Food journaling plan information and food journal log Evidence-based interventions for health behavior change  were utilized today including the discussion of self monitoring techniques, problem-solving barriers and SMART goal setting techniques.   Regarding patient's less desirable eating habits and patterns, we employed the technique of small changes.  Pt will specifically work on: Comptroller her food intake for next visit.    Recommended Physical Activity Goals  Molly Bean has been advised to slowly work up to 150 minutes of moderate intensity aerobic activity a week and strengthening exercises 2-3 times per week for cardiovascular health, weight loss maintenance and preservation of muscle mass.   She has agreed to Continue current level of physical activity    FOLLOW UP: Return in about 8 weeks (around 07/11/2023).  She was informed of the importance of frequent follow up visits to maximize her success with intensive lifestyle modifications for her multiple health conditions.  Subjective:  Chief complaint: Obesity Molly Bean is here to discuss her progress with her obesity treatment plan. She is on the the Category 1 Plan and states she is following her eating plan approximately 90% of the time. She states she is walking 60 minutes 6 days per week and resistance training 45 minutes  days per week.   Interval History:  Molly Bean is here for a follow up office visit.     Since last OV she has been well. She informed me that she recently had a birthday and fell off plan during the weekend and went out to dinners and the [employer]. She recently bought an exercise machine to use at home. Pt endorses that her clothes do fit better and looser than before.    Pharmacotherapy for weight loss: She is currently taking  Metformin and Semaglutide  for medical weight loss.  Denies side effects.    Review of Systems:  Pertinent positives were addressed with patient today.   Reviewed by clinician on day of visit: allergies, medications, problem list, medical history, surgical history,  family history, social history, and previous encounter notes.  Weight Summary and Biometrics   Weight Lost Since Last Visit: 0lb  Weight Gained Since Last Visit: 14lb  Vitals Temp: 97.6 F (36.4 C) BP: 121/76 Pulse Rate: 72 SpO2: 97 %   Anthropometric Measurements Height: 5\' 2"  (1.575 m) Weight: 225 lb (102.1 kg) BMI (Calculated): 41.14 Weight at Last Visit: 211lb Weight Lost Since Last Visit: 0lb Weight Gained Since Last Visit: 14lb Starting Weight: 240lb Total Weight Loss (lbs): 15 lb (6.804 kg) Peak Weight: 242lb   Body Composition  Body Fat %: 49.3 % Fat Mass (lbs): 111.2 lbs Muscle Mass (lbs): 108.4 lbs Total Body Water (lbs): 79 lbs Visceral Fat Rating : 17   Other Clinical Data Fasting: no Labs: no Today's Visit #: 41 Starting Date: 04/14/20     Objective:   PHYSICAL EXAM: Blood pressure 121/76, pulse 72, temperature 97.6 F (36.4 C), height 5\' 2"  (1.575 m), weight 225 lb (102.1 kg), SpO2 97%. Body mass index is 41.15 kg/m.  General: Well Developed, well nourished, and in no acute distress.  HEENT: Normocephalic, atraumatic Skin: Warm and dry, cap RF less 2 sec, good turgor Chest:  Normal excursion, shape, no gross abn Respiratory: speaking in full sentences, no conversational dyspnea NeuroM-Sk: Ambulates w/o assistance, moves * 4 Psych: A and O *3, insight good, mood-full  DIAGNOSTIC DATA REVIEWED:  BMET    Component Value Date/Time   NA 145 (H) 03/31/2023 0852   K 4.5 03/31/2023 0852   CL 105 03/31/2023 0852   CO2 25 03/31/2023 0852   GLUCOSE 85 03/31/2023 0852   GLUCOSE 95 11/12/2019 0930   BUN 11 03/31/2023 0852   CREATININE 0.89 03/31/2023 0852   CALCIUM 9.8 03/31/2023 0852   GFRNONAA 91 04/15/2020 1654   GFRAA 105 04/15/2020 1654   Lab Results  Component Value Date   HGBA1C 5.8 (H) 03/31/2023   HGBA1C 6.1 (H) 04/15/2020   Lab Results  Component Value Date   INSULIN 28.8 (H) 03/31/2023   INSULIN 21.9 04/15/2020   Lab  Results  Component Value Date   TSH 0.95 12/30/2022   CBC    Component Value Date/Time   WBC 7.2 12/30/2022 1429   RBC 5.91 (H) 12/30/2022 1429   HGB 12.7 12/30/2022 1429   HGB 12.5 03/30/2022 1049   HCT 39.6 12/30/2022 1429   HCT 39.5 03/30/2022 1049   PLT 190.0 12/30/2022  1429   PLT 236 03/30/2022 1049   MCV 67.1 Repeated and verified X2. (L) 12/30/2022 1429   MCV 68 (L) 03/30/2022 1049   MCH 21.6 (L) 03/30/2022 1049   MCH 21.7 (L) 09/25/2016 0012   MCHC 32.0 12/30/2022 1429   RDW 17.7 (H) 12/30/2022 1429   RDW 19.0 (H) 03/30/2022 1049   Iron Studies    Component Value Date/Time   IRON 62 12/30/2022 1429   IRON 63 11/03/2021 0942   TIBC 365.4 12/30/2022 1429   TIBC 332 11/03/2021 0942   FERRITIN 38.7 12/30/2022 1429   FERRITIN 72 11/03/2021 0942   IRONPCTSAT 17.0 (L) 12/30/2022 1429   IRONPCTSAT 19 11/03/2021 0942   Lipid Panel     Component Value Date/Time   CHOL 171 11/22/2022 1446   TRIG 78 11/22/2022 1446   HDL 64 11/22/2022 1446   CHOLHDL 2.7 11/22/2022 1446   CHOLHDL 3 11/06/2010 1638   VLDL 20.8 11/06/2010 1638   LDLCALC 92 11/22/2022 1446   LDLDIRECT 96 11/22/2022 1446   Hepatic Function Panel     Component Value Date/Time   PROT 6.0 11/22/2022 1446   ALBUMIN 3.7 (L) 11/22/2022 1446   AST 14 11/22/2022 1446   ALT 17 11/22/2022 1446   ALKPHOS 76 11/22/2022 1446   BILITOT <0.2 11/22/2022 1446   BILIDIR 0.0 01/30/2013 1213      Component Value Date/Time   TSH 0.95 12/30/2022 1429   Nutritional Lab Results  Component Value Date   VD25OH 63.6 03/31/2023   VD25OH 43.7 11/22/2022   VD25OH 57.3 03/30/2022    Attestations:   I, Clinical biochemist, acting as a Stage manager for [employer], DO., have compiled all relevant documentation for today's office visit on behalf of Thomasene Lot, DO, while in the presence of [employer], DO.  I have reviewed the above documentation for accuracy and completeness, and I agree with the above.  Molly Bean, D.O.  The 21st Century Cures Act was signed into law in 2016 which includes the topic of electronic health records.  This provides immediate access to information in MyChart.  This includes consultation notes, operative notes, office notes, lab results and pathology reports.  If you have any questions about what you read please let us know at your next visit so we can discuss your concerns and take corrective action if need be.  We are right here with you.

## 2023-05-24 ENCOUNTER — Ambulatory Visit: Payer: BC Managed Care – PPO | Attending: Obstetrics and Gynecology | Admitting: Physical Therapy

## 2023-05-24 DIAGNOSIS — R2689 Other abnormalities of gait and mobility: Secondary | ICD-10-CM | POA: Diagnosis present

## 2023-05-24 DIAGNOSIS — R278 Other lack of coordination: Secondary | ICD-10-CM | POA: Diagnosis present

## 2023-05-24 DIAGNOSIS — N393 Stress incontinence (female) (male): Secondary | ICD-10-CM | POA: Diagnosis not present

## 2023-05-24 NOTE — Therapy (Unsigned)
OUTPATIENT PHYSICAL THERAPY TREATMENT NOTE / Discharge Summary across 44 visits    Patient Name: Nirali Turnbaugh MRN: [id number] DOB:[birth year], 62 y.o., female Today's Date: 05/24/2023  REFERRING PROVIDER: Florian Buff   PT End of Session - 05/24/23 0810     Visit Number 44    Date for PT Re-Evaluation 06/30/23   PN & recert 06/15/21, 02/03/22, 07/08/22   PT Start Time 0805    PT Stop Time 0845    PT Time Calculation (min) 40 min    Activity Tolerance Patient tolerated treatment well    Behavior During Therapy [facility] for tasks assessed/performed               Past Medical History:  Diagnosis Date   Anxiety    Constipation    DEPRESSION    edema lower extremeties    GERD    Joint pain    MENOPAUSAL SYNDROME    Multiple food allergies    OBESITY    Shortness of breath    Sleep apnea    wears CPAP   Past Surgical History:  Procedure Laterality Date   ABDOMINAL HYSTERECTOMY  11/1998   total fibroids   CESAREAN SECTION     Patient Active Problem List   Diagnosis Date Noted   Other hyperlipidemia 01/04/2023   Other constipation 01/04/2023   BMI 39.0-39.9,adult-current bmi 39.5 01/04/2023   Encounter for general adult medical examination with abnormal findings 12/30/2022   Need for hepatitis C screening test 12/30/2022   Chronic idiopathic constipation 12/30/2022   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   DOE (dyspnea on exertion) 12/30/2022   Dyslipidemia, goal LDL below 130 12/30/2022   Morbid obesity (HCC)-start bmi 43.90 11/25/2022   Axillary hidradenitis suppurativa 07/07/2021   Iron deficiency anemia 12/10/2020   Prediabetes 08/06/2020   Vitamin D deficiency 08/06/2020   Essential hypertension 05/01/2018   Asthma 03/14/2018   Hidradenitis suppurativa 01/18/2018   GAD (generalized anxiety disorder) 10/11/2016   Gastroesophageal reflux disease 04/12/2007    REFERRING DIAG: R32 (ICD-10-CM) - Urinary incontinence, unspecified type  THERAPY DIAG:  Stress  incontinence of urine   Other abnormalities of gait and mobility   Other lack of coordination    Rationale for Evaluation and Treatment Rehabilitation  PERTINENT HISTORY: C-section, abdominal hysterectomy   PRECAUTIONS: N/A  SUBJECTIVE:                Pt reports she is getting somewhere with the long kegel contractions and noticing less leakage.  Pt reports leakage decreased in the pads from 100% wetness to 30% wetness when walking. Pt still notices leakage with getting out of bed , leaning forward. Pt notices her CPAP mask is not sealed well when she sleeps. Her last sleep study was 5 years ago. Pt has lost 30 + lb across the past 5 years. Pt gets up in 2x a night with the urge to urinate at night.Before wearing CPAP, pt was getting up 4-5 x a night and then with the mask, she slept through the night. Within the past year, she has noticed she is getting up 2 x night and the mask ius not well sealed. She changes her mask and machine supplies recommended.    PAIN:  Are you having pain? No    [facility] PT Assessment - 05/24/23 1400       Floor to Stand   Comments uper trap overuse with forearm plantagrade technique over chair  [facility] Adult PT Treatment/Exercise - 05/24/23 1352       Therapeutic Activites    Other Therapeutic Activities reviewed goals, discussed d/c, explained stand<> floor with use of minisquat/ arms      Neuro Re-ed    Neuro Re-ed Details  excessive cues for stand <> floor               PATIENT EDUCATION: Education details: withhold long contractions, focus on not letting abdominal mm overuse    Person educated: Patient Education method: Explanation, Demonstration, Tactile cues, Verbal cues, and Handouts Education comprehension: verbalized understanding, returned demonstration, verbal cues required, tactile cues required, and needs further education    HOME EXERCISE PROGRAM:                        See pt instructions       PT Long  Term Goals -       PT LONG TERM GOAL #1   Title Pt will demo increased hip abduction strength from 3/5 to >4/5 in order to maintain walking routine and pelvic stability    Time 8    Period Weeks    Status Achieved    Target Date 02/03/22      PT LONG TERM GOAL #2   Title Pt will demo proper deep core coordination and pelvic floor movement with no cues to progress to kegel exercises    Time 4    Period Weeks    Status Achieved    Target Date 02/03/22      PT LONG TERM GOAL #3   Title Pt will demo decreased abdominal scar restrictions to advance to deep core exercises and promote IAP for continence and bowel movement elimination with less dependence on Linzess    Time 4    Period Weeks    Status Achieved    Target Date 02/03/22      PT LONG TERM GOAL #4   Title Pt will increase FOTO score for constipation from 41 pts and Urinary 47 pts to > 55 pts in order to demo improved pelvic floor function    ( 06/15/21: no change urinary, 2 pt change improvement  with bowel function,    11/25/21: neg scores of change, need to readminster because pt has reported improvements,   04/21/22: constipation 6 pt change, urinary -2 pt change,      07/08/22: constipation constipation 2pt,  urinary 0 pts) )   05/24/23:  constipation 52 pts  pts and Urinary 60pts t    Time 10    Period Weeks    Status Achieved    Target Date 12/24/2022     PT LONG TERM GOAL #5   Title Pt will demo decreased pelvic floor mm tightness and proper relaxation of mm to have less pain with intercourcse and gynecological exams    Time 10    Period Weeks    Status Achieved    Target Date 02/03/22      PT LONG TERM GOAL #6   Title Pt will demo no pelvic floor tightness/ tendernessacorss 2 sessions, demo proper quick contractions > 5 reps and long holds 3sec, 5 reps without cues ino rder to gain continence    Time 10   Period Weeks    Status Achieved    Target Date 03/08/2023         PT LONG TERM GOAL #7   Title Pt  will report dry pad with walking 0.5 miles  in order to be continence with walking routine    Time 10   Period Weeks    Status Partially met ( 05/24/23:  decreased from 70% wetness with one mile walk to 30 % wetness )    Target Date 06/30/2023        PT LONG TERM GOAL #8  Title Pt will perform fitness exercises with proper alignment and technique to minimize overactivity of pelvic floor and incontinence   Time 10   Period Weeks   Status ACHIEVED   Target Date 12/24/2022          Plan -     Clinical Impression Statement   Pt's improvements include:  Pt has met 7/8 goals and partially met last remaining goal. FOTO scores for urinary , bowel function improved significantly. Pt no longer has tight mm at pelvic floor, spine, lower kinetic chain. Pt is IND with kegel contractions for pelvic floor strengthening. Pt no longer has lowered urethra/ bladder.   Leakage has improved and pads are only 30 % wet with walking compared 100% wet.  Pt has continued to walking 1 miles a day.   Pt is now getting up once a night to pee instead of 3 x a night but when she first got the CPAP machine 5 years ago, she was able to sleep all night.   Pt was provided research that explains the associated factors between OSA and nocturia. Pt was encouraged to f/u with PCP to get an updated sleep study given she has not had one since 5 years ago. Pt has lost 30 + lb across the past 5 years  and she reports she notices air is leaking out of her CPAP mask currently.    Pt is ready for d/c.      Examination-Activity Limitations Toileting;Continence;Other    Stability/Clinical Decision Making Evolving/Moderate complexity    Rehab Potential Good    PT Frequency 1x / month   PT Duration Other (comment)  02/23/2023     PT Treatment/Interventions Neuromuscular re-education;Cryotherapy;ADLs/Self Care Home Management;Therapeutic activities;Functional mobility training;Traction;Moist Heat;Stair training;Gait  training;Patient/family education;Taping;Manual techniques;Scar mobilization;Balance training;Therapeutic exercise;Spinal Manipulations;Joint Manipulations;Energy conservation    Consulted and Agree with Plan of Care Patient            Mariane Masters, PT 05/24/2023, 8:11 AM

## 2023-05-24 NOTE — Patient Instructions (Signed)
   Transition from standing to floor if you have hypertension or can not have your head lower than your heart  Wide squat in front of a CHAIR  Forearms onto the chair Lower knees down into double kneeling   floor   To get up Walk on your knees to the front of chair again Forearms onto the chair Inhale, exhale, pushing onto the forearms to life  lifts hips up, kneeping knees bent hands on thighs, then hips then pause HERE  to avoid (moving too quickly up/ blood rush) -->  knees glide forward and roll  Hips up instead of hinging spine up

## 2023-06-21 ENCOUNTER — Encounter: Admitting: Physical Therapy

## 2023-06-30 ENCOUNTER — Ambulatory Visit: Admitting: Dermatology

## 2023-07-12 ENCOUNTER — Telehealth (INDEPENDENT_AMBULATORY_CARE_PROVIDER_SITE_OTHER): Payer: Self-pay

## 2023-07-12 NOTE — Telephone Encounter (Signed)
Dear Molly Bean:  [facility]  received a request from your provider for coverage of Ozempic (semaglutide). The request was denied because: You do not have Type 2 Diabetes.  Current plan approved criteria allows coverage of Ozempic if the following requirement is met: The patient has a diagnosis of type 2 diabetes mellitus. Your use of this drug does not meet the requirement.

## 2023-07-12 NOTE — Telephone Encounter (Signed)
Prior Auth has been submitted for Ozempic.  Awaiting determination.

## 2023-07-18 ENCOUNTER — Encounter (INDEPENDENT_AMBULATORY_CARE_PROVIDER_SITE_OTHER): Payer: Self-pay | Admitting: Family Medicine

## 2023-07-18 ENCOUNTER — Ambulatory Visit (INDEPENDENT_AMBULATORY_CARE_PROVIDER_SITE_OTHER): Payer: BC Managed Care – PPO | Admitting: Family Medicine

## 2023-07-18 VITALS — BP 129/79 | HR 72 | Temp 99.0°F | Ht 62.0 in | Wt 218.0 lb

## 2023-07-18 DIAGNOSIS — K5909 Other constipation: Secondary | ICD-10-CM

## 2023-07-18 DIAGNOSIS — E559 Vitamin D deficiency, unspecified: Secondary | ICD-10-CM

## 2023-07-18 DIAGNOSIS — G44019 Episodic cluster headache, not intractable: Secondary | ICD-10-CM

## 2023-07-18 DIAGNOSIS — Z6839 Body mass index (BMI) 39.0-39.9, adult: Secondary | ICD-10-CM

## 2023-07-18 DIAGNOSIS — R7303 Prediabetes: Secondary | ICD-10-CM

## 2023-07-18 MED ORDER — TOPIRAMATE 25 MG PO TABS
25.0000 mg | ORAL_TABLET | Freq: Two times a day (BID) | ORAL | 0 refills | Status: DC
Start: 2023-07-18 — End: 2023-09-05

## 2023-07-18 MED ORDER — LINACLOTIDE 145 MCG PO CAPS
ORAL_CAPSULE | ORAL | 0 refills | Status: DC
Start: 2023-07-18 — End: 2023-08-17

## 2023-07-18 MED ORDER — METFORMIN HCL 500 MG PO TABS: 500.0000 mg | ORAL_TABLET | Freq: Two times a day (BID) | ORAL | 0 refills | Status: DC

## 2023-07-18 MED ORDER — VITAMIN D (ERGOCALCIFEROL) 1.25 MG (50000 UNIT) PO CAPS: ORAL_CAPSULE | ORAL | 0 refills | Status: DC

## 2023-07-18 NOTE — Progress Notes (Signed)
[name], D.O.  ABFM, ABOM Specializing in Clinical Bariatric Medicine  Office located at: [address] [place], [place]  [address]     Assessment and Plan:  Come fasting next OV for labs. Check BMP, A1c, FI, Vitamin D, B-12 Medications Discontinued During This Encounter  Medication Reason   Semaglutide, 2 MG/DOSE, 8 MG/3ML SOPN    linaclotide (LINZESS) 145 MCG CAPS capsule Reorder   metFORMIN (GLUCOPHAGE) 500 MG tablet Reorder   Vitamin D, Ergocalciferol, (DRISDOL) 1.25 MG (50000 UNIT) CAPS capsule Reorder    Meds ordered this encounter  Medications   linaclotide (LINZESS) 145 MCG CAPS capsule    Sig: 2 po qd    Dispense:  60 capsule    Refill:  0   metFORMIN (GLUCOPHAGE) 500 MG tablet    Sig: Take 1 tablet (500 mg total) by mouth 2 (two) times daily with a meal.    Dispense:  60 tablet    Refill:  0    Ov for rf   Vitamin D, Ergocalciferol, (DRISDOL) 1.25 MG (50000 UNIT) CAPS capsule    Sig: 1 po q wed, and 1 po q sun    Dispense:  8 capsule    Refill:  0    Ov for rf   topiramate (TOPAMAX) 25 MG tablet    Sig: Take 1 tablet (25 mg total) by mouth 2 (two) times daily.    Dispense:  60 tablet    Refill:  0    Vitamin D deficiency Assessment: Condition is Controlled.. Pt endorses being consistent and tolerant of ERGO twice weekly.   Lab Results  Component Value Date   VD25OH 63.6 03/31/2023   VD25OH 43.7 11/22/2022   VD25OH 57.3 03/30/2022   Plan: - Continue Ergocalciferol 50K IU weekly and she was encouraged to continue to take the medicine until told otherwise. I will refill today.     - weight loss will likely improve availability of vitamin D, thus encouraged Ilee to continue with meal plan and their weight loss efforts to further improve this condition.  Thus, we will need to monitor levels regularly every 3-4 mo on average to keep levels within normal limits and prevent over supplementation.   Pre-diabetes Assessment: Condition is Not at  goal.. She informed me that insurance has denied to refill her Ozempic as she does not qualify anymore. Her last injection of Ozempic was on October 3rd. She can tell a slight difference on her hunger but endorses still having cravings. She continues Metformin without difficulty and denies any GI upset.  Lab Results  Component Value Date   HGBA1C 5.8 (H) 03/31/2023   HGBA1C 6.1 (H) 11/22/2022   HGBA1C 5.7 (H) 03/30/2022   INSULIN 28.8 (H) 03/31/2023   INSULIN 9.1 03/30/2022   INSULIN 22.5 11/03/2021    Plan: -  Continue with Metformin 500mg  as directed.  I will refill today.    - Explained role of simple carbs and insulin levels on hunger and cravings.  - Anticipatory guidance given.    - Lacheryl will continue to work on weight loss, exercise, via their meal plan we devised to help decrease the risk of progressing to diabetes.    Episodic cluster headache, not intractable Assessment: Condition is Uncontrolled. . Pt endorses having severe cluster headaches about once a week. This can be triggered by stress and she takes two 500mg  tablets to help with no resolve. She does experience nausea with her headaches. She denies changes in her eyesight  during these episodes.   Plan: - Begin Topiramate 25mg . I advised her to start with 1 tablet at bedtime and after 4-6 days if tolerated well she can increase to twice a day.    Other constipation Assessment: Condition is Controlled.. Pt endorses this being well controlled as long as she takes the Linzess. She denies any adverse side effects on this.   Plan:- Continue with Linzess as directed.  I will refill today.   - Drink at least half of your weight in ounces of water per day unless otherwise noted by one of your doctors that you must restrict water intake.     TREATMENT PLAN FOR OBESITY: BMI 39.0-39.9,adult-current bmi 39.86 Morbid obesity (HCC)-start bmi 43.90 Assessment:  Yakira Duquette is here to discuss her progress with her  obesity treatment plan along with follow-up of her obesity related diagnoses. See Medical Weight Management Flowsheet for complete bioelectrical impedance results.  Condition is improving but not optimized. Biometric data collected today, was reviewed with patient.   Since last office visit on 05/16/2023 patient's  Muscle mass has decreased by 1.8lb. Fat mass has decreased by 4.8lb. Total body water has decreased by 3.2lb.  Counseling done on how various foods will affect these numbers and how to maximize success  Total lbs lost to date: 22 Total weight loss percentage to date: 9.17%   Plan: - Michell will work on healthier eating habits and try to follow the continue the Category 1 meal plan and optional journaling best they can.   Behavioral Intervention Additional resources provided today: patient declined Evidence-based interventions for health behavior change were utilized today including the discussion of self monitoring techniques, problem-solving barriers and SMART goal setting techniques.   Regarding patient's less desirable eating habits and patterns, we employed the technique of small changes.  Pt will specifically work on: Continuing current regiment for next visit.     She has agreed to Continue current level of physical activity    FOLLOW UP: Return in about 4 weeks (around 08/15/2023).  She was informed of the importance of frequent follow up visits to maximize her success with intensive lifestyle modifications for her multiple health conditions.  Subjective:   Chief complaint: Obesity Larkin is here to discuss her progress with her obesity treatment plan. She is on the the Category 1 Plan and journaling and states she is following her eating plan approximately 85% of the time. She states she is walking 60 minutes 5 days per week and going to PT 45 minutes 6 days a week.   Interval History:  Markesia Crilly is here for a follow up office visit.     Since last office  visit:  Since last OV she has been well and now has a new exercise machine to use. She endorses still walking outside with her husband although it is getting colder.   We reviewed her meal plan and all questions were answered.   Pharmacotherapy for weight loss: She is currently taking  Metformin  for medical weight loss.  Denies side effects.    Review of Systems:  Pertinent positives were addressed with patient today.  Reviewed by clinician on day of visit: allergies, medications, problem list, medical history, surgical history, family history, social history, and previous encounter notes.  Weight Summary and Biometrics   Weight Lost Since Last Visit: 7lb  Weight Gained Since Last Visit: 0lb   Vitals Temp: 99 F (37.2 C) BP: 129/79 Pulse Rate: 72 SpO2: 96 %  Anthropometric Measurements Height: 5\' 2"  (1.575 m) Weight: 218 lb (98.9 kg) BMI (Calculated): 39.86 Weight at Last Visit: 225lb Weight Lost Since Last Visit: 7lb Weight Gained Since Last Visit: 0lb Starting Weight: 240lb Total Weight Loss (lbs): 22 lb (9.979 kg) Peak Weight: 242lb   Body Composition  Body Fat %: 48.7 % Fat Mass (lbs): 106.4 lbs Muscle Mass (lbs): 106.6 lbs Total Body Water (lbs): 75.8 lbs Visceral Fat Rating : 16   Other Clinical Data Fasting: no Labs: no Today's Visit #: 42 Starting Date: 04/14/20     Objective:   PHYSICAL EXAM: Blood pressure 129/79, pulse 72, temperature 99 F (37.2 C), height 5\' 2"  (1.575 m), weight 218 lb (98.9 kg), SpO2 96%. Body mass index is 39.87 kg/m.  General: Well Developed, well nourished, and in no acute distress.  HEENT: Normocephalic, atraumatic Skin: Warm and dry, cap RF less 2 sec, good turgor Chest:  Normal excursion, shape, no gross abn Respiratory: speaking in full sentences, no conversational dyspnea NeuroM-Sk: Ambulates w/o assistance, moves * 4 Psych: A and O *3, insight good, mood-full  DIAGNOSTIC DATA REVIEWED:  BMET     Component Value Date/Time   NA 145 (H) 03/31/2023 0852   K 4.5 03/31/2023 0852   CL 105 03/31/2023 0852   CO2 25 03/31/2023 0852   GLUCOSE 85 03/31/2023 0852   GLUCOSE 95 11/12/2019 0930   BUN 11 03/31/2023 0852   CREATININE 0.89 03/31/2023 0852   CALCIUM 9.8 03/31/2023 0852   GFRNONAA 91 04/15/2020 1654   GFRAA 105 04/15/2020 1654   Lab Results  Component Value Date   HGBA1C 5.8 (H) 03/31/2023   HGBA1C 6.1 (H) 04/15/2020   Lab Results  Component Value Date   INSULIN 28.8 (H) 03/31/2023   INSULIN 21.9 04/15/2020   Lab Results  Component Value Date   TSH 0.95 12/30/2022   CBC    Component Value Date/Time   WBC 7.2 12/30/2022 1429   RBC 5.91 (H) 12/30/2022 1429   HGB 12.7 12/30/2022 1429   HGB 12.5 03/30/2022 1049   HCT 39.6 12/30/2022 1429   HCT 39.5 03/30/2022 1049   PLT 190.0 12/30/2022 1429   PLT 236 03/30/2022 1049   MCV 67.1 Repeated and verified X2. (L) 12/30/2022 1429   MCV 68 (L) 03/30/2022 1049   MCH 21.6 (L) 03/30/2022 1049   MCH 21.7 (L) 09/25/2016 0012   MCHC 32.0 12/30/2022 1429   RDW 17.7 (H) 12/30/2022 1429   RDW 19.0 (H) 03/30/2022 1049   Iron Studies    Component Value Date/Time   IRON 62 12/30/2022 1429   IRON 63 11/03/2021 0942   TIBC 365.4 12/30/2022 1429   TIBC 332 11/03/2021 0942   FERRITIN 38.7 12/30/2022 1429   FERRITIN 72 11/03/2021 0942   IRONPCTSAT 17.0 (L) 12/30/2022 1429   IRONPCTSAT 19 11/03/2021 0942   Lipid Panel     Component Value Date/Time   CHOL 171 11/22/2022 1446   TRIG 78 11/22/2022 1446   HDL 64 11/22/2022 1446   CHOLHDL 2.7 11/22/2022 1446   CHOLHDL 3 11/06/2010 1638   VLDL 20.8 11/06/2010 1638   LDLCALC 92 11/22/2022 1446   LDLDIRECT 96 11/22/2022 1446   Hepatic Function Panel     Component Value Date/Time   PROT 6.0 11/22/2022 1446   ALBUMIN 3.7 (L) 11/22/2022 1446   AST 14 11/22/2022 1446   ALT 17 11/22/2022 1446   ALKPHOS 76 11/22/2022 1446   BILITOT <0.2 11/22/2022 1446   BILIDIR 0.0  01/30/2013 1213      Component Value Date/Time   TSH 0.95 12/30/2022 1429   Nutritional Lab Results  Component Value Date   VD25OH 63.6 03/31/2023   VD25OH 43.7 11/22/2022   VD25OH 57.3 03/30/2022    Attestations:   This encounter took 40 total minutes of time including any pre-visit and post-visit time spent on this date of service, including taking a thorough history, reviewing any labs and/or imaging, reviewing prior notes, as well as documenting in the electronic health record on the date of service. Over 50% of that time was in direct face-to-face counseling and coordinating care for the patient today  I, Clinical biochemist, acting as a Stage manager for [employer], DO., have compiled all relevant documentation for today's office visit on behalf of Thomasene Lot, DO, while in the presence of [employer], DO.  I have reviewed the above documentation for accuracy and completeness, and I agree with the above. [name], D.O.  The 21st Century Cures Act was signed into law in 2016 which includes the topic of electronic health records.  This provides immediate access to information in MyChart.  This includes consultation notes, operative notes, office notes, lab results and pathology reports.  If you have any questions about what you read please let us know at your next visit so we can discuss your concerns and take corrective action if need be.  We are right here with you.

## 2023-07-19 ENCOUNTER — Encounter: Admitting: Physical Therapy

## 2023-07-27 ENCOUNTER — Telehealth (INDEPENDENT_AMBULATORY_CARE_PROVIDER_SITE_OTHER): Payer: Self-pay | Admitting: Family Medicine

## 2023-07-27 ENCOUNTER — Encounter (INDEPENDENT_AMBULATORY_CARE_PROVIDER_SITE_OTHER): Payer: Self-pay

## 2023-07-27 NOTE — Telephone Encounter (Signed)
Patient called in stating her pharmacy told her she will need a prior authorization on her Linzess before they can fill this prescription. She uses Counselling psychologist on [address].  Thank you

## 2023-08-09 ENCOUNTER — Telehealth (INDEPENDENT_AMBULATORY_CARE_PROVIDER_SITE_OTHER): Payer: Self-pay

## 2023-08-09 NOTE — Telephone Encounter (Signed)
Prior auth started for Linzess.  Awaiting questions.

## 2023-08-16 NOTE — Telephone Encounter (Signed)
Call to patient, she has an appointment tomorrow and she will bring in her insurance cards.

## 2023-08-17 ENCOUNTER — Telehealth (INDEPENDENT_AMBULATORY_CARE_PROVIDER_SITE_OTHER): Payer: Self-pay

## 2023-08-17 MED ORDER — LINACLOTIDE 290 MCG PO CAPS
290.0000 ug | ORAL_CAPSULE | Freq: Every day | ORAL | 1 refills | Status: DC
Start: 2023-08-17 — End: 2023-09-05

## 2023-08-17 NOTE — Telephone Encounter (Signed)
Molly Bean (Key: [id number] Sent to plan, awaiting response

## 2023-08-17 NOTE — Addendum Note (Signed)
Addended by: Sande Brothers on: 08/17/2023 10:35 AM   Modules accepted: Orders

## 2023-08-17 NOTE — Telephone Encounter (Signed)
Dosage changed. No prior auth needed

## 2023-08-17 NOTE — Telephone Encounter (Signed)
Called [employer] and spoke with a representative.  The dose written for linzess 145 mcg is not covered with 2 tablets daily.  The higher dose 290 is covered with once daily signature.  Dr Sharee Holster is aware and will write for higher dose.  No prior auth needed per customer service.

## 2023-08-23 ENCOUNTER — Ambulatory Visit (INDEPENDENT_AMBULATORY_CARE_PROVIDER_SITE_OTHER): Payer: BC Managed Care – PPO | Admitting: Family Medicine

## 2023-08-23 ENCOUNTER — Encounter: Admitting: Physical Therapy

## 2023-09-05 ENCOUNTER — Encounter (INDEPENDENT_AMBULATORY_CARE_PROVIDER_SITE_OTHER): Payer: Self-pay | Admitting: Family Medicine

## 2023-09-05 ENCOUNTER — Ambulatory Visit (INDEPENDENT_AMBULATORY_CARE_PROVIDER_SITE_OTHER): Payer: BC Managed Care – PPO | Admitting: Family Medicine

## 2023-09-05 VITALS — BP 131/74 | HR 68 | Temp 98.4°F | Ht 62.0 in | Wt 227.0 lb

## 2023-09-05 DIAGNOSIS — E559 Vitamin D deficiency, unspecified: Secondary | ICD-10-CM | POA: Diagnosis not present

## 2023-09-05 DIAGNOSIS — E7849 Other hyperlipidemia: Secondary | ICD-10-CM | POA: Diagnosis not present

## 2023-09-05 DIAGNOSIS — Z6841 Body Mass Index (BMI) 40.0 and over, adult: Secondary | ICD-10-CM

## 2023-09-05 DIAGNOSIS — R7303 Prediabetes: Secondary | ICD-10-CM

## 2023-09-05 DIAGNOSIS — Z6839 Body mass index (BMI) 39.0-39.9, adult: Secondary | ICD-10-CM

## 2023-09-05 DIAGNOSIS — K5909 Other constipation: Secondary | ICD-10-CM | POA: Diagnosis not present

## 2023-09-05 MED ORDER — VITAMIN D (ERGOCALCIFEROL) 1.25 MG (50000 UNIT) PO CAPS: ORAL_CAPSULE | ORAL | 0 refills | Status: DC

## 2023-09-05 MED ORDER — LINACLOTIDE 290 MCG PO CAPS: 290.0000 ug | ORAL_CAPSULE | Freq: Every day | ORAL | 1 refills | Status: DC

## 2023-09-05 MED ORDER — METFORMIN HCL 500 MG PO TABS: 500.0000 mg | ORAL_TABLET | Freq: Two times a day (BID) | ORAL | 0 refills | Status: DC

## 2023-09-05 NOTE — Progress Notes (Signed)
[name], D.O.  ABFM, ABOM Specializing in Clinical Bariatric Medicine  Office located at: [address] [place], [place]  [address]   Assessment and Plan:  Review labs next OV FOR THE DISEASE OF OBESITY: BMI 39.0-39.9,adult-current bmi 41.51 -     Vitamin B12 Morbid obesity (HCC)-start bmi 43.90 -     Vitamin B12  Since last office visit on 07/18/2023 patient's  Muscle mass has increased by 5.6lb. Fat mass has increased by 2.2lb. Total body water has increased by 9.6lb.  Counseling done on how various foods will affect these numbers and how to maximize success  Total lbs lost to date: 13 Total weight loss percentage to date: 5.42%    Recommended Dietary Goals Molly Bean is currently in the action stage of change. As such, her goal is to continue weight management plan.  She has agreed to: continue current plan  - I recommended [facility] [place].   Behavioral Intervention We discussed the following today: increasing lean protein intake to established goals, avoiding skipping meals, increasing water intake , work on tracking and journaling calories using tracking application, continue to practice mindfulness when eating, and planning for success  Additional resources provided today: handout on CAT 1 meal plan , Handout on CAT 1-2 breakfast options, and Handout on CAT 1-2 lunch options  Evidence-based interventions for health behavior change were utilized today including the discussion of self monitoring techniques, problem-solving barriers and SMART goal setting techniques.   Regarding patient's less desirable eating habits and patterns, we employed the technique of small changes.   Pt will specifically work on: Join a gym and go 3 days a wk for next visit.    Recommended Physical Activity Goals Molly Bean has been advised to work up to 150 minutes of moderate intensity aerobic activity a week and strengthening exercises 2-3 times per week for  cardiovascular health, weight loss maintenance and preservation of muscle mass.   She has agreed to :  Think about enjoyable ways to increase daily physical activity and overcoming barriers to exercise and Increase physical activity in their day and reduce sedentary time (increase NEAT).   Pharmacotherapy We discussed various medication options to help Molly Bean with her weight loss efforts and we both agreed to : continue with nutritional and behavioral strategies   FOR ASSOCIATED CONDITIONS ADDRESSED TODAY: Vitamin D deficiency Assessment & Plan:  Condition is Controlled.. Patient reports good compliance and tolerance of taking ERGO. She tolerates this well without difficulty and denies any adverse side effects on this medication alone.   Continue with ERGO at current dose as directed by prescribing provider. We will need to monitor levels regularly (every 3-4 mo on average) to keep levels within normal limits and prevent over supplementation. I will recheck levels today.  Vitamin D deficiency -     Vitamin D (Ergocalciferol); 1 po q wed, and 1 po q sun  Dispense: 8 capsule; Refill: 0 -     VITAMIN D 25 Hydroxy (Vit-D Deficiency, Fractures) -     Vitamin B12   Pre-diabetes Assessment & Plan:  Condition is Not at goal.. Pt endorses her hunger and cravings are mainly controlled when following the prescribed meal plan exactly. This is being treated with Metformin which she tolerates well and denies any GI upset or N/V/D on this medication alone.   Continue with Metformin at current dose BID. Molly Bean will continue to work on weight loss, exercise, via their meal plan we devised to help  decrease the risk of progressing to diabetes. Anticipatory guidance given. We will recheck A1c and fasting insulin level today.  Pre-diabetes -     metFORMIN HCl; Take 1 tablet (500 mg total) by mouth 2 (two) times daily with a meal.  Dispense: 60 tablet; Refill: 0 -     Basic metabolic panel -     Vitamin  K44 -     Hemoglobin A1c -     Insulin, random   Other constipation Assessment & Plan:  Condition is Controlled. Her insurance will not cover Linzess BID and will only cover once daily. She endorses liking the better.  Other constipation -     linaCLOtide; Take 1 capsule (290 mcg total) by mouth daily before breakfast.  Dispense: 30 capsule; Refill: 1 -     Vitamin B12   Other hyperlipidemia Assessment & Plan:  Patient reports good compliance and tolerance of taking Crestor without difficulty. She denies any adverse side effects. Continue follow-ups with PCP or specialist recommended. Molly Bean agrees to continue with meds and/or our treatment plan of a heart-heathy, low cholesterol meal plan. We will continue routine screening as patient continues to achieve health goals along their weight loss journey. I will recheck her levels today.   Other hyperlipidemia -     Vitamin B12 -     Lipid panel  Follow up:   Return in about 1 month (around 10/06/2023). She was informed of the importance of frequent follow up visits to maximize her success with intensive lifestyle modifications for her multiple health conditions. Molly Bean is aware that we will review all of her lab results at our next visit together in person.  She is aware that if anything is critical/ life threatening with the results, we will be contacting her via MyChart or by my CMA will be calling them prior to the office visit to discuss acute management.    Subjective:   Chief complaint: Obesity Molly Bean is here to discuss her progress with her obesity treatment plan. She is on the the Category 1 Plan optional journaling and states she is following her eating plan approximately 75% of the time. She states she is walking 60 minutes 6 days per week and weights 45 minutes 5 days per week..  Interval History:  Molly Bean is here for a follow up office visit. Since last OV,  she has been well.  She notes eating sweets, fried foods, and other fatty foods on Thanksgiving. She informed me that she will be joining a gym today with a friend to be able to keep each other motivated and accountable. She informed me that she only ate breakfast/lunch yesterday. Which consisted of 3 cheese sticks and 2 boiled eggs for breakfast and of collard greens, tortilla scoops, and rotel dip for lunch. She also drinks 2% milk instead of skim milk.    Barriers identified: exposure to enticing environments and/or relationships.   Pharmacotherapy for weight loss: She is currently taking Metformin (off label use for incretin effect and / or insulin resistance and / or diabetes prevention) with adequate clinical response  and without side effects. and Topiramate (off label use, single agent) experiencing the following side effects: nausea, cramping, vomiting..   Review of Systems:  Pertinent positives were addressed with patient today.  Reviewed by clinician on day of visit: allergies, medications, problem list, medical history, surgical history, family history, social history, and previous encounter notes.  Weight Summary and Biometrics   Weight Lost  Since Last Visit: 0lb  Weight Gained Since Last Visit: 9lb  Vitals Temp: 98.4 F (36.9 C) BP: 131/74 Pulse Rate: 68 SpO2: 100 %   Anthropometric Measurements Height: 5\' 2"  (1.575 m) Weight: 227 lb (103 kg) BMI (Calculated): 41.51 Weight at Last Visit: 218lb Weight Lost Since Last Visit: 0lb Weight Gained Since Last Visit: 9lb Starting Weight: 240lb Total Weight Loss (lbs): 13 lb (5.897 kg) Peak Weight: 242lb   Body Composition  Body Fat %: 47.8 % Fat Mass (lbs): 108.6 lbs Muscle Mass (lbs): 112.4 lbs Total Body Water (lbs): 85.4 lbs Visceral Fat Rating : 13   Other Clinical Data Fasting: no Labs: no Today's Visit #: 43 Starting Date: 04/14/20    Objective:   PHYSICAL EXAM: Blood pressure 131/74, pulse 68, temperature 98.4 F  (36.9 C), height 5\' 2"  (1.575 m), weight 227 lb (103 kg), SpO2 100%. Body mass index is 41.52 kg/m.  General: she is overweight, cooperative and in no acute distress. PSYCH: Has normal mood, affect and thought process.   HEENT: EOMI, sclerae are anicteric. Lungs: Normal breathing effort, no conversational dyspnea. Extremities: Moves * 4 Neurologic: A and O * 3, good insight  DIAGNOSTIC DATA REVIEWED: BMET    Component Value Date/Time   NA 145 (H) 03/31/2023 0852   K 4.5 03/31/2023 0852   CL 105 03/31/2023 0852   CO2 25 03/31/2023 0852   GLUCOSE 85 03/31/2023 0852   GLUCOSE 95 11/12/2019 0930   BUN 11 03/31/2023 0852   CREATININE 0.89 03/31/2023 0852   CALCIUM 9.8 03/31/2023 0852   GFRNONAA 91 04/15/2020 1654   GFRAA 105 04/15/2020 1654   Lab Results  Component Value Date   HGBA1C 5.8 (H) 03/31/2023   HGBA1C 6.1 (H) 04/15/2020   Lab Results  Component Value Date   INSULIN 28.8 (H) 03/31/2023   INSULIN 21.9 04/15/2020   Lab Results  Component Value Date   TSH 0.95 12/30/2022   CBC    Component Value Date/Time   WBC 7.2 12/30/2022 1429   RBC 5.91 (H) 12/30/2022 1429   HGB 12.7 12/30/2022 1429   HGB 12.5 03/30/2022 1049   HCT 39.6 12/30/2022 1429   HCT 39.5 03/30/2022 1049   PLT 190.0 12/30/2022 1429   PLT 236 03/30/2022 1049   MCV 67.1 Repeated and verified X2. (L) 12/30/2022 1429   MCV 68 (L) 03/30/2022 1049   MCH 21.6 (L) 03/30/2022 1049   MCH 21.7 (L) 09/25/2016 0012   MCHC 32.0 12/30/2022 1429   RDW 17.7 (H) 12/30/2022 1429   RDW 19.0 (H) 03/30/2022 1049   Iron Studies    Component Value Date/Time   IRON 62 12/30/2022 1429   IRON 63 11/03/2021 0942   TIBC 365.4 12/30/2022 1429   TIBC 332 11/03/2021 0942   FERRITIN 38.7 12/30/2022 1429   FERRITIN 72 11/03/2021 0942   IRONPCTSAT 17.0 (L) 12/30/2022 1429   IRONPCTSAT 19 11/03/2021 0942   Lipid Panel     Component Value Date/Time   CHOL 171 11/22/2022 1446   TRIG 78 11/22/2022 1446   HDL 64  11/22/2022 1446   CHOLHDL 2.7 11/22/2022 1446   CHOLHDL 3 11/06/2010 1638   VLDL 20.8 11/06/2010 1638   LDLCALC 92 11/22/2022 1446   LDLDIRECT 96 11/22/2022 1446   Hepatic Function Panel     Component Value Date/Time   PROT 6.0 11/22/2022 1446   ALBUMIN 3.7 (L) 11/22/2022 1446   AST 14 11/22/2022 1446   ALT 17 11/22/2022 1446  ALKPHOS 76 11/22/2022 1446   BILITOT <0.2 11/22/2022 1446   BILIDIR 0.0 01/30/2013 1213      Component Value Date/Time   TSH 0.95 12/30/2022 1429   Nutritional Lab Results  Component Value Date   VD25OH 63.6 03/31/2023   VD25OH 43.7 11/22/2022   VD25OH 57.3 03/30/2022    Attestations:   I, Clinical biochemist, acting as a Stage manager for [employer], DO., have compiled all relevant documentation for today's office visit on behalf of Thomasene Lot, DO, while in the presence of [employer], DO.  Reviewed by clinician on day of visit: allergies, medications, problem list, medical history, surgical history, family history, social history, and previous encounter notes pertinent to patient's obesity diagnosis. preparing to see patient (e.g. review and interpretation of tests, old notes ), obtaining and/or reviewing separately obtained history, performing a medically appropriate examination or evaluation, counseling and educating the patient, ordering medications, test or procedures, documenting clinical information in the electronic or other health care record, and independently interpreting results and communicating results to the patient, family, or caregiver   I have reviewed the above documentation for accuracy and completeness, and I agree with the above. [name], D.O.  The 21st Century Cures Act was signed into law in 2016 which includes the topic of electronic health records.  This provides immediate access to information in MyChart.  This includes consultation notes, operative notes, office notes, lab results and pathology reports.   If you have any questions about what you read please let us know at your next visit so we can discuss your concerns and take corrective action if need be.  We are right here with you.

## 2023-09-07 LAB — INSULIN, RANDOM: INSULIN: 13.1 u[IU]/mL (ref 2.6–24.9)

## 2023-09-07 LAB — LIPID PANEL
Chol/HDL Ratio: 2.6 {ratio} (ref 0.0–4.4)
Cholesterol, Total: 204 mg/dL — ABNORMAL HIGH (ref 100–199)
HDL: 80 mg/dL (ref >39)
LDL Chol Calc (NIH): 110 mg/dL — ABNORMAL HIGH (ref 0–99)
Triglycerides: 80 mg/dL (ref 0–149)
VLDL Cholesterol Cal: 14 mg/dL (ref 5–40)

## 2023-09-07 LAB — VITAMIN D 25 HYDROXY (VIT D DEFICIENCY, FRACTURES): Vit D, 25-Hydroxy: 33.1 ng/mL (ref 30.0–100.0)

## 2023-09-07 LAB — BASIC METABOLIC PANEL
BUN/Creatinine Ratio: 12 (ref 12–28)
BUN: 12 mg/dL (ref 8–27)
CO2: 20 mmol/L (ref 20–29)
Calcium: 9.5 mg/dL (ref 8.7–10.3)
Chloride: 104 mmol/L (ref 96–106)
Creatinine, Ser: 0.97 mg/dL (ref 0.57–1.00)
Glucose: 92 mg/dL (ref 70–99)
Potassium: 4.4 mmol/L (ref 3.5–5.2)
Sodium: 142 mmol/L (ref 134–144)
eGFR: 65 mL/min/{1.73_m2} (ref >59)

## 2023-09-07 LAB — HEMOGLOBIN A1C
Est. average glucose Bld gHb Est-mCnc: 126 mg/dL
Hgb A1c MFr Bld: 6 % — ABNORMAL HIGH (ref 4.8–5.6)

## 2023-09-07 LAB — VITAMIN B12: Vitamin B-12: 481 pg/mL (ref 232–1245)

## 2023-09-20 ENCOUNTER — Encounter: Admitting: Physical Therapy

## 2023-10-10 ENCOUNTER — Encounter: Payer: Self-pay | Admitting: Dermatology

## 2023-10-10 ENCOUNTER — Ambulatory Visit: Payer: 59 | Admitting: Dermatology

## 2023-10-10 VITALS — BP 114/76

## 2023-10-10 DIAGNOSIS — L732 Hidradenitis suppurativa: Secondary | ICD-10-CM | POA: Diagnosis not present

## 2023-10-10 MED ORDER — DOXYCYCLINE HYCLATE 100 MG PO CAPS: 100.0000 mg | ORAL_CAPSULE | Freq: Two times a day (BID) | ORAL | 5 refills | Status: AC

## 2023-10-10 MED ORDER — CLINDAMYCIN PHOSPHATE 1 % EX LOTN: TOPICAL_LOTION | Freq: Every day | CUTANEOUS | 5 refills | Status: DC

## 2023-10-10 NOTE — Progress Notes (Signed)
   New Patient Visit   Subjective  Molly Bean is a 63 y.o. female who presents for the following: She has recurrent boils under both of her arms for a year or more. She has had one lanced in the past. She was given doxycycline  for that one episode.   The following portions of the chart were reviewed this encounter and updated as appropriate: medications, allergies, medical history  Review of Systems:  No other skin or systemic complaints except as noted in HPI or Assessment and Plan.  Objective  Well appearing patient in no apparent distress; mood and affect are within normal limits.   A focused examination was performed of the following areas: axillary areas  Relevant exam findings are noted in the Assessment and Plan.    Assessment & Plan   HIDRADENITIS SUPPURATIVA-hurley stage 1 Exam: comedonal plugging and scarring  - Assessment: Patient has recurrent boils under the arms for over a year, previously treated with lancing and doxycycline . Examination shows comedonal plugging and mild scarring, indicative of early-stage hidradenitis suppurativa. HS is a chronic condition with a genetic basis, causing inflammation of apocrine glands in the axillae, breasts, and groin. Hormonal changes, stress, and diet are noted as potential triggers.  - Plan:    - Prescribe Benzoyl peroxide wash for daily use.   - Doxycycline  100mg  PO BID for 7 days at the first sign of flare.   - Clindamycin  lotion applied topically BID PRN for flares.   - Schedule follow-up in 4 months or sooner if needed.   - Take baseline photographs for progress tracking.   - Consider Kenalog  injections for faster relief if doxycycline  is ineffective during active flares.   - Potential consideration of Cosentyx (secukinumab) injections if initial treatments fail to reduce the frequency or severity of flares.   Return in about 4 months (around 02/07/2024) for HS.  LILLETTE Darice Smock, CMA, am acting as scribe for  [employer], DO.    Documentation: I have reviewed the above documentation for accuracy and completeness, and I agree with the above.  Delon Lenis, DO

## 2023-10-10 NOTE — Patient Instructions (Addendum)
 Hello Molly Bean,  Thank you for visiting us  today. We appreciate your commitment to improving your health and managing your hidradenitis suppurativa (HS). Here is a summary of the key instructions from today's consultation:  - Benzoyl Peroxide Soap: Wash affected areas with CeraVe Acne Foaming Was with benzoyl peroxide soap to prevent severe flares.  - Doxycycline :    - Dosage: Take one tablet twice a day.   - Duration: Continue for a week at the first sign of a flare.  - Clindamycin  Lotion:   - Application: Apply topically at the first sign of a flare.   - Frequency: Use twice a day as needed.  - Follow-Up: Schedule a return visit in four months, or sooner if you experience a severe flare.  - Monitoring Progress: Photographs were taken today to monitor your progress and will continue to be taken in future visits.  Please start these treatments as prescribed and closely monitor your symptoms. If the current plan does not reduce the frequency or severity of your flares, we will consider additional treatments such as Cosentyx.  Thank you once again for your visit today. I look forward to seeing you again and continuing to support your health journey.  Warm regards,  Dr. Delon Lenis,  Dermatology  Important Information  Due to recent changes in healthcare laws, you may see results of your pathology and/or laboratory studies on MyChart before the doctors have had a chance to review them. We understand that in some cases there may be results that are confusing or concerning to you. Please understand that not all results are received at the same time and often the doctors may need to interpret multiple results in order to provide you with the best plan of care or course of treatment. Therefore, we ask that you please give us  2 business days to thoroughly review all your results before contacting the office for clarification. Should we see a critical lab result, you will be contacted  sooner.   If You Need Anything After Your Visit  If you have any questions or concerns for your doctor, please call our main line at [phone] If no one answers, please leave a voicemail as directed and we will return your call as soon as possible. Messages left after 4 pm will be answered the following business day.   You may also send us  a message via MyChart. We typically respond to MyChart messages within 1-2 business days.  For prescription refills, please ask your pharmacy to contact our office. Our fax number is [phone].  If you have an urgent issue when the clinic is closed that cannot wait until the next business day, you can page your doctor at the number below.    Please note that while we do our best to be available for urgent issues outside of office hours, we are not available 24/7.   If you have an urgent issue and are unable to reach us , you may choose to seek medical care at your doctor's office, retail clinic, urgent care center, or emergency room.  If you have a medical emergency, please immediately call 911 or go to the emergency department. In the event of inclement weather, please call our main line at [phone] for an update on the status of any delays or closures.  Dermatology Medication Tips: Please keep the boxes that topical medications come in in order to help keep track of the instructions about where and how to use these. Pharmacies typically print the medication instructions  only on the boxes and not directly on the medication tubes.   If your medication is too expensive, please contact our office at [phone] or send us  a message through MyChart.   We are unable to tell what your co-pay for medications will be in advance as this is different depending on your insurance coverage. However, we may be able to find a substitute medication at lower cost or fill out paperwork to get insurance to cover a needed medication.   If a prior authorization is  required to get your medication covered by your insurance company, please allow us  1-2 business days to complete this process.  Drug prices often vary depending on where the prescription is filled and some pharmacies may offer cheaper prices.  The website www.goodrx.com contains coupons for medications through different pharmacies. The prices here do not account for what the cost may be with help from insurance (it may be cheaper with your insurance), but the website can give you the price if you did not use any insurance.  - You can print the associated coupon and take it with your prescription to the pharmacy.  - You may also stop by our office during regular business hours and pick up a GoodRx coupon card.  - If you need your prescription sent electronically to a different pharmacy, notify our office through [facility] or by phone at [phone]

## 2023-10-12 ENCOUNTER — Encounter (INDEPENDENT_AMBULATORY_CARE_PROVIDER_SITE_OTHER): Payer: Self-pay | Admitting: Family Medicine

## 2023-10-12 ENCOUNTER — Ambulatory Visit (INDEPENDENT_AMBULATORY_CARE_PROVIDER_SITE_OTHER): Payer: 59 | Admitting: Family Medicine

## 2023-10-12 DIAGNOSIS — E559 Vitamin D deficiency, unspecified: Secondary | ICD-10-CM | POA: Diagnosis not present

## 2023-10-12 DIAGNOSIS — R7303 Prediabetes: Secondary | ICD-10-CM | POA: Diagnosis not present

## 2023-10-12 DIAGNOSIS — E7849 Other hyperlipidemia: Secondary | ICD-10-CM | POA: Diagnosis not present

## 2023-10-12 DIAGNOSIS — K5909 Other constipation: Secondary | ICD-10-CM | POA: Diagnosis not present

## 2023-10-12 DIAGNOSIS — Z6841 Body Mass Index (BMI) 40.0 and over, adult: Secondary | ICD-10-CM

## 2023-10-12 MED ORDER — METFORMIN HCL 500 MG PO TABS: 500.0000 mg | ORAL_TABLET | Freq: Two times a day (BID) | ORAL | 0 refills | Status: DC

## 2023-10-12 MED ORDER — VITAMIN D (ERGOCALCIFEROL) 1.25 MG (50000 UNIT) PO CAPS: ORAL_CAPSULE | ORAL | 0 refills | Status: DC

## 2023-10-12 MED ORDER — LINACLOTIDE 290 MCG PO CAPS: 290.0000 ug | ORAL_CAPSULE | Freq: Every day | ORAL | 1 refills | Status: DC

## 2023-10-12 NOTE — Progress Notes (Signed)
 Molly Bean, D.O.  ABFM, ABOM Specializing in Clinical Bariatric Medicine  Office located at: [address] [place], [place]  [address]   Assessment and Plan:   FOR THE DISEASE OF OBESITY: Morbid obesity (HCC)-start bmi 43.90 BMI 40.0-44.9, adult (HCC)- current bmi 42.06 Assessment & Plan: Since last office visit on 09/05/23 patient's muscle mass has decreased by 5 lb. Fat mass has increased by 8.2lb. Total body water has decreased by 5.4 lb.  Counseling done on how various foods will affect these numbers and how to maximize success  Total lbs lost to date: 10 lbs Total weight loss percentage to date: -4.17%  Pt states she would like to lose 50 lbs to get to a goal weight of 180 lbs by August of this year.    Recommended Dietary Goals Molly Bean is currently in the action stage of change. As such, her goal is to continue weight management plan.  She has agreed to: continue current plan   Behavioral Intervention We discussed the following today: increasing lean protein intake to established goals, decreasing simple carbohydrates , increasing vegetables, avoiding skipping meals, increasing water intake , keeping healthy foods at home, decreasing eating out or consumption of processed foods, and making healthy choices when eating convenient foods, avoiding temptations and identifying enticing environmental cues, and continue to work on maintaining a reduced calorie state, getting the recommended amount of protein, incorporating whole foods, making healthy choices, staying well hydrated and practicing mindfulness when eating.  Additional resources provided today: None  Evidence-based interventions for health behavior change were utilized today including the discussion of self monitoring techniques, problem-solving barriers and SMART goal setting techniques.   Regarding patient's less desirable eating habits and patterns, we employed the technique of small changes.   Pt will  specifically work on: increase weight training doing 2 sets of 10-12 reps and improve mindful eating for next visit.    Recommended Physical Activity Goals Molly Bean has been advised to work up to 150 minutes of moderate intensity aerobic activity a week and strengthening exercises 2-3 times per week for cardiovascular health, weight loss maintenance and preservation of muscle mass.   She has agreed to :  Increase physical activity in their day and reduce sedentary time (increase NEAT). and Increase the intensity, frequency or duration of strengthening exercises    Pharmacotherapy We discussed various medication options to help Molly Bean with her weight loss efforts and we both agreed to : continue with nutritional and behavioral strategies and continue current anti-obesity medication regimen   FOR ASSOCIATED CONDITIONS ADDRESSED TODAY:  Other constipation Assessment & Plan: Her condition is well controlled. She is complaint with linzess  290 mcg once daily. Tolerating well with no SE. Pt reports daily bowel movements. Continue with current regimen with no dose changes. I advise she drink at least half her body weight in ounces of water daily.   Orders: - Refill Linzess , no changes.    Pre-diabetes Assessment & Plan: Lab Results  Component Value Date   HGBA1C 6.0 (H) 09/05/2023   HGBA1C 5.8 (H) 03/31/2023   HGBA1C 6.1 (H) 11/22/2022   INSULIN  13.1 09/05/2023   INSULIN  28.8 (H) 03/31/2023   INSULIN  9.1 03/30/2022    Lab Results  Component Value Date   CREATININE 0.97 09/05/2023   Hunger/cravings well controlled. Molly Bean reports compliance and tolerance of Metformin  500 mg BID. Denies any nausea, GI upset or other adverse side effects to Metformin . Her A1c has increased from 5.8 in 03/2023 to 6.0  on 09/05/23. Reviewed creatinine and kidney function labs with pt, also reviewing ideal levels. Per pt she is drinking enough water throughout the day.   Ideal A1C levels for prediabetes  reviewed with pt, she verbalized understanding. Kidney function is expected to improve as she properly hydrates and increased exercises. I also recommend she avoid skipping meals. Advised to drink at least half her body weight in ounces of water. I recommend she start journaling to improve mindfulness and pay attention to calories of foods when eating out. Pt would rather not journal, but agreed to work on improving her mindfulness. I recommend she increase the weight she uses when weight training. Continue with Metformin  as prescribed with no dose changes today.   Orders: - Refill Metformin , no changes today.    Vitamin D  deficiency Assessment & Plan: Lab Results  Component Value Date   VD25OH 33.1 09/05/2023   VD25OH 63.6 03/31/2023   VD25OH 43.7 11/22/2022   Vitamin D  is sub-optimal at 33.1. Pt is compliant with ERGO 50K units twice weekly, on Sunday and Wednesdays. Tolerating well, no side effects. Denies any skipped doses. Also is taking B12 as instructed with no reported side effects. Reviewed ideal goal of 50+. We will continue at current dose and will recheck vitamin D  levels in 3-4 months from last labs.   Orders: - Refill ERGO, no dose changes.    Other hyperlipidemia Assessment & Plan: Lab Results  Component Value Date   CHOL 204 (H) 09/05/2023   HDL 80 09/05/2023   LDLCALC 110 (H) 09/05/2023   LDLDIRECT 96 11/22/2022   TRIG 80 09/05/2023   CHOLHDL 2.6 09/05/2023   Condition treated with Crestor . Tolerating well with no side effects. Reviewed lipid panel obtained on 09/05/23. HDL has improved to 80. LDL is above goal at 110, previously was 92 on 11/22/22. Pt attributes her increase in LDL to eating more fatty meats and eating out more in the last few months. I advised pt to follow a  heart healthy diet and avoid trans and saturated fats. Continue with statin therapy as instructed by PCP. We will monitor her condition alongside PCP/specialists.    Follow up:   Return in  about 4 weeks (around 11/09/2023). She was informed of the importance of frequent follow up visits to maximize her success with intensive lifestyle modifications for her multiple health conditions.  Subjective:   Chief complaint: Obesity Molly Bean is here to discuss her progress with her obesity treatment plan. She is on the Category 1 Plan and states she is following her eating plan approximately 88% of the time. She states she is walking 30 minutes 5 days per week and cardio 45 minutes 5 days per week. Also lifting weights with a 3 lb dumbbell for 45 minutes a day. She also adds light weights while walking.   Interval History:  Molly Bean is here for a follow up office visit. Since last OV, she is up 3 lbs. Some eating out and eating off-plan, especially over the holidays. Does not tend to eat bread. Has had more fatty meats and eating out more than usual in the last few months.   Barriers identified: Holiday/celebration eating  Pharmacotherapy for weight loss: She is currently taking Metformin  (off label use for incretin effect and / or insulin  resistance and / or diabetes prevention) with adequate clinical response  and without side effects..   Review of Systems:  Pertinent positives were addressed with patient today.  Reviewed by clinician on day of visit:  allergies, medications, problem list, medical history, surgical history, family history, social history, and previous encounter notes.  Weight Summary and Biometrics   Weight Lost Since Last Visit: 0lb  Weight Gained Since Last Visit: 3lb    Vitals Temp: 98.2 F (36.8 C) BP: 124/72 Pulse Rate: 72 SpO2: 97 %   Anthropometric Measurements Height: 5' 2 (1.575 m) Weight: 230 lb (104.3 kg) BMI (Calculated): 42.06 Weight at Last Visit: 227lb Weight Lost Since Last Visit: 0lb Weight Gained Since Last Visit: 3lb Starting Weight: 240lb Total Weight Loss (lbs): 10 lb (4.536 kg) Peak Weight: 242lb   Body Composition   Body Fat %: 50.8 % Fat Mass (lbs): 116.8 lbs Muscle Mass (lbs): 107.4 lbs Total Body Water (lbs): 80 lbs Visceral Fat Rating : 17   Other Clinical Data Fasting: no Labs: no Today's Visit #: 44 Starting Date: 04/14/20    Objective:   PHYSICAL EXAM: Blood pressure 124/72, pulse 72, temperature 98.2 F (36.8 C), height 5' 2 (1.575 m), weight 230 lb (104.3 kg), SpO2 97%. Body mass index is 42.07 kg/m.  General: she is overweight, cooperative and in no acute distress. PSYCH: Has normal mood, affect and thought process.   HEENT: EOMI, sclerae are anicteric. Lungs: Normal breathing effort, no conversational dyspnea. Extremities: Moves * 4 Neurologic: A and O * 3, good insight  DIAGNOSTIC DATA REVIEWED: BMET    Component Value Date/Time   NA 142 09/05/2023 1159   K 4.4 09/05/2023 1159   CL 104 09/05/2023 1159   CO2 20 09/05/2023 1159   GLUCOSE 92 09/05/2023 1159   GLUCOSE 95 11/12/2019 0930   BUN 12 09/05/2023 1159   CREATININE 0.97 09/05/2023 1159   CALCIUM  9.5 09/05/2023 1159   GFRNONAA 91 04/15/2020 1654   GFRAA 105 04/15/2020 1654   Lab Results  Component Value Date   HGBA1C 6.0 (H) 09/05/2023   HGBA1C 6.1 (H) 04/15/2020   Lab Results  Component Value Date   INSULIN  13.1 09/05/2023   INSULIN  21.9 04/15/2020   Lab Results  Component Value Date   TSH 0.95 12/30/2022   CBC    Component Value Date/Time   WBC 7.2 12/30/2022 1429   RBC 5.91 (H) 12/30/2022 1429   HGB 12.7 12/30/2022 1429   HGB 12.5 03/30/2022 1049   HCT 39.6 12/30/2022 1429   HCT 39.5 03/30/2022 1049   PLT 190.0 12/30/2022 1429   PLT 236 03/30/2022 1049   MCV 67.1 Repeated and verified X2. (L) 12/30/2022 1429   MCV 68 (L) 03/30/2022 1049   MCH 21.6 (L) 03/30/2022 1049   MCH 21.7 (L) 09/25/2016 0012   MCHC 32.0 12/30/2022 1429   RDW 17.7 (H) 12/30/2022 1429   RDW 19.0 (H) 03/30/2022 1049   Iron  Studies    Component Value Date/Time   IRON  62 12/30/2022 1429   IRON  63 11/03/2021  0942   TIBC 365.4 12/30/2022 1429   TIBC 332 11/03/2021 0942   FERRITIN 38.7 12/30/2022 1429   FERRITIN 72 11/03/2021 0942   IRONPCTSAT 17.0 (L) 12/30/2022 1429   IRONPCTSAT 19 11/03/2021 0942   Lipid Panel     Component Value Date/Time   CHOL 204 (H) 09/05/2023 1159   TRIG 80 09/05/2023 1159   HDL 80 09/05/2023 1159   CHOLHDL 2.6 09/05/2023 1159   CHOLHDL 3 11/06/2010 1638   VLDL 20.8 11/06/2010 1638   LDLCALC 110 (H) 09/05/2023 1159   LDLDIRECT 96 11/22/2022 1446   Hepatic Function Panel     Component Value  Date/Time   PROT 6.0 11/22/2022 1446   ALBUMIN 3.7 (L) 11/22/2022 1446   AST 14 11/22/2022 1446   ALT 17 11/22/2022 1446   ALKPHOS 76 11/22/2022 1446   BILITOT <0.2 11/22/2022 1446   BILIDIR 0.0 01/30/2013 1213      Component Value Date/Time   TSH 0.95 12/30/2022 1429   Nutritional Lab Results  Component Value Date   VD25OH 33.1 09/05/2023   VD25OH 63.6 03/31/2023   VD25OH 43.7 11/22/2022    Attestations:   I, Vernell Forest, acting as a medical scribe for Molly Jenkins, DO., have compiled all relevant documentation for today's office visit on behalf of Molly Jenkins, DO, while in the presence of [employer], DO.  Reviewed by clinician on day of visit: allergies, medications, problem list, medical history, surgical history, family history, social history, and previous encounter notes pertinent to patient's obesity diagnosis.  I have reviewed the above documentation for accuracy and completeness, and I agree with the above. Molly JINNY Bean, D.O.  The 21st Century Cures Act was signed into law in 2016 which includes the topic of electronic health records.  This provides immediate access to information in MyChart.  This includes consultation notes, operative notes, office notes, lab results and pathology reports.  If you have any questions about what you read please let us  know at your next visit so we can discuss your concerns and take corrective action  if need be.  We are right here with you.

## 2023-11-08 ENCOUNTER — Telehealth (INDEPENDENT_AMBULATORY_CARE_PROVIDER_SITE_OTHER): Payer: Self-pay | Admitting: Family Medicine

## 2023-11-08 NOTE — Telephone Encounter (Signed)
Notified patient that due to our refill policy we don't refill medications between visits, however when she is feeling better she can come for a follow-up to discuss medication refills with any of our providers here. Patient verbalized understanding.

## 2023-11-08 NOTE — Telephone Encounter (Signed)
Pt called in to cx her appt and stated that she needs a refill on Meformin, Vitamin D, and Lenzest. Please follow up with the patient.

## 2023-11-09 ENCOUNTER — Ambulatory Visit (INDEPENDENT_AMBULATORY_CARE_PROVIDER_SITE_OTHER): Admitting: Family Medicine

## 2023-11-22 ENCOUNTER — Ambulatory Visit (INDEPENDENT_AMBULATORY_CARE_PROVIDER_SITE_OTHER): Payer: 59 | Admitting: Family Medicine

## 2023-11-22 ENCOUNTER — Encounter (INDEPENDENT_AMBULATORY_CARE_PROVIDER_SITE_OTHER): Payer: Self-pay | Admitting: Family Medicine

## 2023-11-22 VITALS — BP 137/75 | HR 74 | Temp 98.2°F | Ht 62.0 in | Wt 231.0 lb

## 2023-11-22 DIAGNOSIS — E559 Vitamin D deficiency, unspecified: Secondary | ICD-10-CM

## 2023-11-22 DIAGNOSIS — K5909 Other constipation: Secondary | ICD-10-CM

## 2023-11-22 DIAGNOSIS — R7303 Prediabetes: Secondary | ICD-10-CM

## 2023-11-22 DIAGNOSIS — Z6841 Body Mass Index (BMI) 40.0 and over, adult: Secondary | ICD-10-CM

## 2023-11-22 MED ORDER — VITAMIN D (ERGOCALCIFEROL) 1.25 MG (50000 UNIT) PO CAPS: ORAL_CAPSULE | ORAL | 0 refills | Status: DC

## 2023-11-22 MED ORDER — LINACLOTIDE 290 MCG PO CAPS: 290.0000 ug | ORAL_CAPSULE | Freq: Every day | ORAL | 1 refills | Status: DC

## 2023-11-22 MED ORDER — METFORMIN HCL 500 MG PO TABS: 500.0000 mg | ORAL_TABLET | Freq: Two times a day (BID) | ORAL | 0 refills | Status: DC

## 2023-11-22 NOTE — Progress Notes (Signed)
[name], D.O.  ABFM, ABOM Specializing in Clinical Bariatric Medicine  Office located at: [address] [place], [place]  [address]   Assessment and Plan:   FOR THE DISEASE OF OBESITY: BMI 40.0-44.9, adult (HCC)- current bmi 42.24 Morbid obesity (HCC)-start bmi 43.90 Assessment & Plan: Since last office visit on 10/12/2023 patient's  Muscle mass has increased by 1.4 lb. Fat mass has increased by 0.4 lb. Total body water has decreased by 0.8 lb.  Counseling done on how various foods will affect these numbers and how to maximize success  Total lbs lost to date: 9 lbs  Total weight loss percentage to date: 3.75%    Recommended Dietary Goals Katriel is currently in the action stage of change. As such, her goal is to continue weight management plan.  She has agreed to: continue current plan   Behavioral Intervention We discussed the following today: proper form for squats, practicing mindfulness/making smarter choices when eating out, limiting red meat intake to only 2x weekly.    Additional resources provided today:  handout on non-starchy vegetables.  Evidence-based interventions for health behavior change were utilized today including the discussion of self monitoring techniques, problem-solving barriers and SMART goal setting techniques.   Regarding patient's less desirable eating habits and patterns, we employed the technique of small changes.   Pt will specifically work on: n/a   Recommended Physical Activity Goals Krissia has been advised to work up to 150 minutes of moderate intensity aerobic activity a week and strengthening exercises 2-3 times per week for cardiovascular health, weight loss maintenance and preservation of muscle mass.   She has agreed to : Continue current level of physical activity    Pharmacotherapy We both agreed to : continue with nutritional and behavioral strategies and continue medication regimen   FOR ASSOCIATED CONDITIONS  ADDRESSED TODAY:  Pre-diabetes Assessment & Plan: Pre-DM treated with Metformin 500 mg two tablets at dinner. She reports sometimes experiencing nausea from the Metformin - upon food recall this is likely from her eating simple carbs and foods high in unhealthy fats. In the past she has also tried taking both Metformin tablets at breakfast - when she did this she also experienced nausea and this was likely from not eating enough food in the mornings.    Reminded pt that the more she eats "off-plan", the more likely for her to have side effects from the Metformin. Consider taking one tablet of the Metformin at lunch and one at dinner. Continue RCNP. Losing 10% or more of body weight may improve condition.   Orders:  -     metFORMIN HCl; Take 1 tablet (500 mg total) by mouth 2 (two) times daily with a meal.  Dispense: 60 tablet; Refill: 0   Other constipation Assessment & Plan: Condition is well controlled on Linzess 290 mcg daily. Pt reports daily bowel movements. No acute concerns. Continue medication regimen and adequate hydration. Will continue to monitor.   Orders:  -     linaCLOtide; Take 1 capsule (290 mcg total) by mouth daily before breakfast.  Dispense: 30 capsule; Refill: 1   Vitamin D deficiency Assessment & Plan:  Pt doing well on ERGO 50,000 units twice weekly. Continue regimen. Recheck periodically.  Orders:  -     Vitamin D (Ergocalciferol); 1 po q wed, and 1 po q sun  Dispense: 8 capsule; Refill: 0   Follow up:   Return 12/28/2023. She was informed of the importance of frequent follow up  visits to maximize her success with intensive lifestyle modifications for her multiple health conditions.  Subjective:   Chief complaint: Obesity Shahad is here to discuss her progress with her obesity treatment plan. She is on the Category 1 Plan and states she is following her eating plan approximately 80% of the time. She states she is walking at home 30 minutes, 6 days a week  and lifting weights 30 minutes daily.   Interval History:  Tamani Durney is here for a follow up office visit. Since last OV on 10/12/2023, Mrs.Wallander is up 1 lb. Pt went to the beach recently during the [place] Bowl weekend - she admits to indulging a bit in "off-plan" foods like pizza. She has been eating out 2x per week at [employer] and usually orders a burrito bowl without rice and w/ chips.   Pharmacotherapy for weight loss: She is currently taking  Metformin 500 mg two tablets at dinner .   Review of Systems:  Pertinent positives were addressed with patient today.  Reviewed by clinician on day of visit: allergies, medications, problem list, medical history, surgical history, family history, social history, and previous encounter notes.  Weight Summary and Biometrics   Weight Lost Since Last Visit: 0  Weight Gained Since Last Visit: 1   Vitals Temp: 98.2 F (36.8 C) BP: 137/75 Pulse Rate: 74 SpO2: 98 %   Anthropometric Measurements Height: 5\' 2"  (1.575 m) Weight: 231 lb (104.8 kg) BMI (Calculated): 42.24 Weight at Last Visit: 230 lb Weight Lost Since Last Visit: 0 Weight Gained Since Last Visit: 1 Starting Weight: 240 lb Total Weight Loss (lbs): 9 lb (4.082 kg) Peak Weight: 242 lb   Body Composition  Body Fat %: 50.6 % Fat Mass (lbs): 117.2 lbs Muscle Mass (lbs): 108.8 lbs Total Body Water (lbs): 79.2 lbs Visceral Fat Rating : 17   Other Clinical Data Fasting: no Labs: no Today's Visit #: 45 Starting Date: 04/14/20   Objective:   PHYSICAL EXAM: Blood pressure 137/75, pulse 74, temperature 98.2 F (36.8 C), height 5\' 2"  (1.575 m), weight 231 lb (104.8 kg), SpO2 98%. Body mass index is 42.25 kg/m.  General: she is overweight, cooperative and in no acute distress. PSYCH: Has normal mood, affect and thought process.   HEENT: EOMI, sclerae are anicteric. Lungs: Normal breathing effort, no conversational dyspnea. Extremities: Moves * 4 Neurologic: A and  O * 3, good insight  DIAGNOSTIC DATA REVIEWED: BMET    Component Value Date/Time   NA 142 09/05/2023 1159   K 4.4 09/05/2023 1159   CL 104 09/05/2023 1159   CO2 20 09/05/2023 1159   GLUCOSE 92 09/05/2023 1159   GLUCOSE 95 11/12/2019 0930   BUN 12 09/05/2023 1159   CREATININE 0.97 09/05/2023 1159   CALCIUM 9.5 09/05/2023 1159   GFRNONAA 91 04/15/2020 1654   GFRAA 105 04/15/2020 1654   Lab Results  Component Value Date   HGBA1C 6.0 (H) 09/05/2023   HGBA1C 6.1 (H) 04/15/2020   Lab Results  Component Value Date   INSULIN 13.1 09/05/2023   INSULIN 21.9 04/15/2020   Lab Results  Component Value Date   TSH 0.95 12/30/2022   CBC    Component Value Date/Time   WBC 7.2 12/30/2022 1429   RBC 5.91 (H) 12/30/2022 1429   HGB 12.7 12/30/2022 1429   HGB 12.5 03/30/2022 1049   HCT 39.6 12/30/2022 1429   HCT 39.5 03/30/2022 1049   PLT 190.0 12/30/2022 1429   PLT 236 03/30/2022 1049  MCV 67.1 Repeated and verified X2. (L) 12/30/2022 1429   MCV 68 (L) 03/30/2022 1049   MCH 21.6 (L) 03/30/2022 1049   MCH 21.7 (L) 09/25/2016 0012   MCHC 32.0 12/30/2022 1429   RDW 17.7 (H) 12/30/2022 1429   RDW 19.0 (H) 03/30/2022 1049   Iron Studies    Component Value Date/Time   IRON 62 12/30/2022 1429   IRON 63 11/03/2021 0942   TIBC 365.4 12/30/2022 1429   TIBC 332 11/03/2021 0942   FERRITIN 38.7 12/30/2022 1429   FERRITIN 72 11/03/2021 0942   IRONPCTSAT 17.0 (L) 12/30/2022 1429   IRONPCTSAT 19 11/03/2021 0942   Lipid Panel     Component Value Date/Time   CHOL 204 (H) 09/05/2023 1159   TRIG 80 09/05/2023 1159   HDL 80 09/05/2023 1159   CHOLHDL 2.6 09/05/2023 1159   CHOLHDL 3 11/06/2010 1638   VLDL 20.8 11/06/2010 1638   LDLCALC 110 (H) 09/05/2023 1159   LDLDIRECT 96 11/22/2022 1446   Hepatic Function Panel     Component Value Date/Time   PROT 6.0 11/22/2022 1446   ALBUMIN 3.7 (L) 11/22/2022 1446   AST 14 11/22/2022 1446   ALT 17 11/22/2022 1446   ALKPHOS 76 11/22/2022  1446   BILITOT <0.2 11/22/2022 1446   BILIDIR 0.0 01/30/2013 1213      Component Value Date/Time   TSH 0.95 12/30/2022 1429   Nutritional Lab Results  Component Value Date   VD25OH 33.1 09/05/2023   VD25OH 63.6 03/31/2023   VD25OH 43.7 11/22/2022    Attestations:   I, Special Puri, acting as a Stage manager for [employer], DO., have compiled all relevant documentation for today's office visit on behalf of Thomasene Lot, DO, while in the presence of [employer], DO.  I have reviewed the above documentation for accuracy and completeness, and I agree with the above. [name], D.O.  The 21st Century Cures Act was signed into law in 2016 which includes the topic of electronic health records.  This provides immediate access to information in MyChart.  This includes consultation notes, operative notes, office notes, lab results and pathology reports.  If you have any questions about what you read please let us know at your next visit so we can discuss your concerns and take corrective action if need be.  We are right here with you.

## 2023-11-28 ENCOUNTER — Encounter: Payer: Self-pay | Admitting: Family Medicine

## 2023-11-28 LAB — HM MAMMOGRAPHY

## 2023-12-28 ENCOUNTER — Ambulatory Visit (INDEPENDENT_AMBULATORY_CARE_PROVIDER_SITE_OTHER): Payer: 59 | Admitting: Family Medicine

## 2023-12-28 ENCOUNTER — Encounter (INDEPENDENT_AMBULATORY_CARE_PROVIDER_SITE_OTHER): Payer: Self-pay | Admitting: Family Medicine

## 2023-12-28 VITALS — BP 135/83 | HR 65 | Temp 98.1°F | Ht 62.0 in | Wt 234.0 lb

## 2023-12-28 DIAGNOSIS — Z6841 Body Mass Index (BMI) 40.0 and over, adult: Secondary | ICD-10-CM

## 2023-12-28 DIAGNOSIS — E559 Vitamin D deficiency, unspecified: Secondary | ICD-10-CM

## 2023-12-28 DIAGNOSIS — F5089 Other specified eating disorder: Secondary | ICD-10-CM | POA: Diagnosis not present

## 2023-12-28 DIAGNOSIS — R7303 Prediabetes: Secondary | ICD-10-CM

## 2023-12-28 DIAGNOSIS — K5909 Other constipation: Secondary | ICD-10-CM

## 2023-12-28 DIAGNOSIS — Z9189 Other specified personal risk factors, not elsewhere classified: Secondary | ICD-10-CM | POA: Diagnosis not present

## 2023-12-28 DIAGNOSIS — F39 Unspecified mood [affective] disorder: Secondary | ICD-10-CM

## 2023-12-28 MED ORDER — BUPROPION HCL ER (SR) 100 MG PO TB12: 100.0000 mg | ORAL_TABLET | Freq: Two times a day (BID) | ORAL | 1 refills | Status: DC

## 2023-12-28 MED ORDER — METFORMIN HCL 500 MG PO TABS: 500.0000 mg | ORAL_TABLET | Freq: Two times a day (BID) | ORAL | 1 refills | Status: DC

## 2023-12-28 MED ORDER — VITAMIN D (ERGOCALCIFEROL) 1.25 MG (50000 UNIT) PO CAPS: ORAL_CAPSULE | ORAL | 1 refills | Status: DC

## 2023-12-28 MED ORDER — LINACLOTIDE 290 MCG PO CAPS: 290.0000 ug | ORAL_CAPSULE | Freq: Every day | ORAL | 1 refills | Status: DC

## 2023-12-28 NOTE — Progress Notes (Signed)
 [name], D.O.  ABFM, ABOM Specializing in Clinical Bariatric Medicine  Office located at: [address] [place], [place]  [address]   Assessment and Plan:   Medications Discontinued During This Encounter  Medication Reason   linaclotide (LINZESS) 290 MCG CAPS capsule Reorder   metFORMIN (GLUCOPHAGE) 500 MG tablet Reorder   Vitamin D, Ergocalciferol, (DRISDOL) 1.25 MG (50000 UNIT) CAPS capsule Reorder     Meds ordered this encounter  Medications   Vitamin D, Ergocalciferol, (DRISDOL) 1.25 MG (50000 UNIT) CAPS capsule    Sig: 1 po q wed, and 1 po q sun    Dispense:  8 capsule    Refill:  1    Ov for rf   linaclotide (LINZESS) 290 MCG CAPS capsule    Sig: Take 1 capsule (290 mcg total) by mouth daily before breakfast.    Dispense:  30 capsule    Refill:  1   metFORMIN (GLUCOPHAGE) 500 MG tablet    Sig: Take 1 tablet (500 mg total) by mouth 2 (two) times daily with a meal.    Dispense:  60 tablet    Refill:  1    Ov for rf   buPROPion ER (WELLBUTRIN SR) 100 MG 12 hr tablet    Sig: Take 1 tablet (100 mg total) by mouth 2 (two) times daily.    Dispense:  60 tablet    Refill:  1    PT WAS TOLD TO Come in 30 minutes early to her next visit for fasting labs and IC.  Come fasting, no caffeine, and no exercise/ activity prior to OV.    FOR THE DISEASE OF OBESITY: BMI 40.0-44.9, adult (HCC)- current bmi 42.79 Morbid obesity (HCC)-start bmi 43.90 Assessment & Plan: Since last office visit on 11/22/2023, patient's muscle mass has increased by 0.4 lbs. Fat mass has increased by 2.2 lbs. Total body water has increased by 2.2 lbs.   Counseling done on how various foods will affect these numbers and how to maximize success  Total lbs lost to date: 6 lbs Total weight loss percentage to date: 2.50%    Recommended Dietary Goals Molly Bean is currently in the action stage of change. As such, her goal is to continue weight management plan.  She has agreed to: continue  current plan   Behavioral Intervention We discussed the following today: work on managing stress, creating time for self-care and relaxation and continue to work on implementation of reduced calorie nutritional plan  Additional resources provided today: Handout for mental health resources, Handout on benefits of Metformin  Evidence-based interventions for health behavior change were utilized today including the discussion of self monitoring techniques, problem-solving barriers and SMART goal setting techniques.   Regarding patient's less desirable eating habits and patterns, we employed the technique of small changes.   Pt will specifically work on: self care, seek help of counselor   Recommended Physical Activity Goals Molly Bean has been advised to work up to 150 minutes of moderate intensity aerobic activity a week and strengthening exercises 2-3 times per week for cardiovascular health, weight loss maintenance and preservation of muscle mass.   She has agreed to :  Continue current level of physical activity    Pharmacotherapy We both agreed to : Start Wellbutrin SR 100 mg once daily, then increase to twice daily if tolerated well   FOR ASSOCIATED CONDITIONS ADDRESSED TODAY:  Vitamin D deficiency Assessment & Plan: Molly Bean is on ERGO 50000 units twice weekly. She denies any  adverse SE. Pt is tolerating well. No acute concerns. Will continue to monitor- reck vit D next OV.   Relevant Orders: -     Vitamin D (Ergocalciferol); 1 po q wed, and 1 po q sun  Dispense: 8 capsule; Refill: 1   Pre-diabetes Assessment & Plan: Molly Bean is taking Metformin 500 mg twice daily. Pt denies any GI upset. Hunger/cravings well controlled. Pt expresses concerns about information she has come across about ill-effects of Metformin. Pt given handouts on benefits and extensive counseling done addressing her specific concerns.   - Pt agrees to continue current medication regimen and RF given. Continue  following prudent nutritional plan. Will recheck A1c during next OV.   Relevant Orders: -     metFORMIN HCl; Take 1 tablet (500 mg total) by mouth 2 (two) times daily with a meal.  Dispense: 60 tablet; Refill: 1   Other constipation Assessment & Plan: Molly Bean is on Linzess 290 mcg once daily. Pt endorses medication is relieving her symptoms effectively, and she is experiencing regular bowel movements. Continue medication at current dose and prioritizing adequate water intake as well as fiber per her PNP.  Refill given.  Will monitor condition closely.  Relevant Orders: -     linaCLOtide; Take 1 capsule (290 mcg total) by mouth daily before breakfast.  Dispense: 30 capsule; Refill: 1   Mood disorder - emotional eating Assessment & Plan: Pt is not currently on any mood medications.  She reports recent stressors. Pt may start counseling in the near future for her excess worry and mood.  D/t these recent stressors, pt reports feeling depressed, saddened and a bit hopeless. She denies any SI/HI.  However, pt would like to incorporate antidepressant medications today after our discussion of Risks/ Benefits.  Discussed risks/benefits of Wellbutrin specifically due to it's anti-obesity effect as well.  Pt is agreeable to starting new medication, will start Wellbutrin SR 100 mg today. Instructed pt to start at once daily for 5-6 days, if tolerated well, increase dose to twice daily. Continue engaging in stress relieving activities. Will continue to monitor condition closely. Recommend counselor for her!  Relevant Orders: -     buPROPion HCl ER (SR); Take 1 tablet (100 mg total) by mouth 2 (two) times daily.  Dispense: 60 tablet; Refill: 1   At high risk for altered family dynamics Assessment & Plan:  Molly Bean was given approximately 8 minutes of preventative counseling today due to her higher than average risk for altered family dynamics.  She has been married to [place] for several decades  and recently she was told he wanted a divorce. Overall, she feels safe in her home and denies fears of physical abuse at this time, she is at risk for emotional abuse however.  Right now things are cordial. Molly Bean was counseled on the importance of regular exercise, a healthy relationship with food, and a good support system.  We discussed various strategies to help cope with these new circumstances as well.    Follow up:   Return in 7 weeks (on 02/13/2024) for arrive prior to appt fasting for IC, no food, caffeine and no exercise/sex B-4 appt.   She was informed of the importance of frequent follow up visits to maximize her success with intensive lifestyle modifications for her multiple health conditions.   Subjective:   Chief complaint: Obesity Molly Bean is here to discuss her progress with her obesity treatment plan. She is on the the Category 1 Plan and states she is  following her eating plan approximately 75% of the time. She states she is walking or lifting weights 45-60 minutes 6 days per week.  Interval History:  Molly Bean is here for a follow up office visit. Since last OV on 11/22/2023, Molly Bean is up 3 lbs. She has been going through a lot of recent stressors, including issues within her marriage. Additionally, pt is getting ready to travel. She expresses no complaints regarding her MP.  Pharmacotherapy for weight loss: She is currently taking Metformin 500 mg twice daily.   Review of Systems:  Pertinent positives were addressed with patient today.  Reviewed by clinician on day of visit: allergies, medications, problem list, medical history, surgical history, family history, social history, and previous encounter notes.   Weight Summary and Biometrics   Weight Lost Since Last Visit: 0  Weight Gained Since Last Visit: 3lb   Vitals Temp: 98.1 F (36.7 C) BP: 135/83 Pulse Rate: 65 SpO2: 97 %   Anthropometric Measurements Height: 5\' 2"  (1.575 m) Weight:  234 lb (106.1 kg) BMI (Calculated): 42.79 Weight at Last Visit: 230lb Weight Lost Since Last Visit: 0 Weight Gained Since Last Visit: 3lb Starting Weight: 240lb Total Weight Loss (lbs): 6 lb (2.722 kg) Peak Weight: 242lb   Body Composition  Body Fat %: 50.9 % Fat Mass (lbs): 119.4 lbs Muscle Mass (lbs): 109.2 lbs Total Body Water (lbs): 81.4 lbs Visceral Fat Rating : 18   Other Clinical Data Fasting: no Labs: no Today's Visit #: 46 Starting Date: 04/14/20   Objective:   PHYSICAL EXAM: Blood pressure 135/83, pulse 65, temperature 98.1 F (36.7 C), height 5\' 2"  (1.575 m), weight 234 lb (106.1 kg), SpO2 97%. Body mass index is 42.8 kg/m.  General: she is overweight, cooperative and in mild emotional distress. PSYCH: Has depressed mood, normal affect and thought process.   HEENT: EOMI, sclerae are anicteric. Lungs: Normal breathing effort, no conversational dyspnea. Extremities: Moves * 4 Neurologic: A and O * 3, good insight  DIAGNOSTIC DATA REVIEWED: BMET    Component Value Date/Time   NA 142 09/05/2023 1159   K 4.4 09/05/2023 1159   CL 104 09/05/2023 1159   CO2 20 09/05/2023 1159   GLUCOSE 92 09/05/2023 1159   GLUCOSE 95 11/12/2019 0930   BUN 12 09/05/2023 1159   CREATININE 0.97 09/05/2023 1159   CALCIUM 9.5 09/05/2023 1159   GFRNONAA 91 04/15/2020 1654   GFRAA 105 04/15/2020 1654   Lab Results  Component Value Date   HGBA1C 6.0 (H) 09/05/2023   HGBA1C 6.1 (H) 04/15/2020   Lab Results  Component Value Date   INSULIN 13.1 09/05/2023   INSULIN 21.9 04/15/2020   Lab Results  Component Value Date   TSH 0.95 12/30/2022   CBC    Component Value Date/Time   WBC 7.2 12/30/2022 1429   RBC 5.91 (H) 12/30/2022 1429   HGB 12.7 12/30/2022 1429   HGB 12.5 03/30/2022 1049   HCT 39.6 12/30/2022 1429   HCT 39.5 03/30/2022 1049   PLT 190.0 12/30/2022 1429   PLT 236 03/30/2022 1049   MCV 67.1 Repeated and verified X2. (L) 12/30/2022 1429   MCV 68 (L)  03/30/2022 1049   MCH 21.6 (L) 03/30/2022 1049   MCH 21.7 (L) 09/25/2016 0012   MCHC 32.0 12/30/2022 1429   RDW 17.7 (H) 12/30/2022 1429   RDW 19.0 (H) 03/30/2022 1049   Iron Studies    Component Value Date/Time   IRON 62 12/30/2022 1429   IRON 63  11/03/2021 0942   TIBC 365.4 12/30/2022 1429   TIBC 332 11/03/2021 0942   FERRITIN 38.7 12/30/2022 1429   FERRITIN 72 11/03/2021 0942   IRONPCTSAT 17.0 (L) 12/30/2022 1429   IRONPCTSAT 19 11/03/2021 0942   Lipid Panel     Component Value Date/Time   CHOL 204 (H) 09/05/2023 1159   TRIG 80 09/05/2023 1159   HDL 80 09/05/2023 1159   CHOLHDL 2.6 09/05/2023 1159   CHOLHDL 3 11/06/2010 1638   VLDL 20.8 11/06/2010 1638   LDLCALC 110 (H) 09/05/2023 1159   LDLDIRECT 96 11/22/2022 1446   Hepatic Function Panel     Component Value Date/Time   PROT 6.0 11/22/2022 1446   ALBUMIN 3.7 (L) 11/22/2022 1446   AST 14 11/22/2022 1446   ALT 17 11/22/2022 1446   ALKPHOS 76 11/22/2022 1446   BILITOT <0.2 11/22/2022 1446   BILIDIR 0.0 01/30/2013 1213      Component Value Date/Time   TSH 0.95 12/30/2022 1429   Nutritional Lab Results  Component Value Date   VD25OH 33.1 09/05/2023   VD25OH 63.6 03/31/2023   VD25OH 43.7 11/22/2022    Attestations:   I, Camryn Mix, acting as a Stage manager for [employer], DO., have compiled all relevant documentation for today's office visit on behalf of Thomasene Lot, DO, while in the presence of [employer], DO.  I have reviewed the above documentation for accuracy and completeness, and I agree with the above. [name], D.O.  The 21st Century Cures Act was signed into law in 2016 which includes the topic of electronic health records.  This provides immediate access to information in MyChart.  This includes consultation notes, operative notes, office notes, lab results and pathology reports.  If you have any questions about what you read please let us know at your next visit so we can  discuss your concerns and take corrective action if need be.  We are right here with you.

## 2023-12-29 ENCOUNTER — Telehealth (INDEPENDENT_AMBULATORY_CARE_PROVIDER_SITE_OTHER): Payer: Self-pay | Admitting: Family Medicine

## 2023-12-29 NOTE — Telephone Encounter (Signed)
 03/27 pt needs refill on Liness medication

## 2024-02-07 ENCOUNTER — Ambulatory Visit: Admitting: Dermatology

## 2024-02-07 ENCOUNTER — Encounter: Payer: Self-pay | Admitting: Dermatology

## 2024-02-07 VITALS — BP 143/95

## 2024-02-07 DIAGNOSIS — L723 Sebaceous cyst: Secondary | ICD-10-CM | POA: Diagnosis not present

## 2024-02-07 DIAGNOSIS — L732 Hidradenitis suppurativa: Secondary | ICD-10-CM

## 2024-02-07 MED ORDER — TRIAMCINOLONE ACETONIDE 40 MG/ML IJ SUSP
40.0000 mg | Freq: Once | INTRAMUSCULAR | Status: AC
  Administered 2024-02-07: 40 mg via INTRAMUSCULAR

## 2024-02-07 MED ORDER — RIFAMPIN 300 MG PO CAPS: 300.0000 mg | ORAL_CAPSULE | Freq: Two times a day (BID) | ORAL | 0 refills | Status: DC

## 2024-02-07 NOTE — Progress Notes (Signed)
   Follow-Up Visit   Subjective  Molly Bean is a 63 y.o. female who presents for the following: HS  Patient present today for follow up visit. Patient was last evaluated on 10/10/23. At this visit patient was prescribed Clindamycin  lotion & Doxycycline  100mg . Pt stated that she has had 2 flares since LOV and one active flare today. Patient reports sxs are unchanged and doesn't feel that the Doxycycline  is working. Patient would like to discuss other options previously mentioned of Kenalog  injections or starting Cosentyx. Patient denies medication changes.  The following portions of the chart were reviewed this encounter and updated as appropriate: medications, allergies, medical history  Review of Systems:  No other skin or systemic complaints except as noted in HPI or Assessment and Plan.  Objective  Well appearing patient in no apparent distress; mood and affect are within normal limits.   A focused examination was performed of the following areas: B/L axilla   Relevant exam findings are noted in the Assessment and Plan.  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 Patient has been using benzoyl peroxide wash daily and clindamycin  lotion as prescribed. Taking doxycycline  for 5 days without noticeable improvement. Previous management strategies appear to be insufficient for adequate control of HS flares.  - Plan:    Administer intralesional Kenalog  injection (0.05 cc) to the active flare site under the arm    Discontinue doxycycline     Start rifampin (dose and frequency not specified) for 7 days as needed for flares     - Informed patient of potential side effect: orange-colored urine    Continue daily benzoyl peroxide wash    Continue daily clindamycin  lotion application    Order laboratory tests:     - QuantiFERON-Gold      - Hepatitis panel    Provide patient education on Humira and Cosentyx as potential biologic treatment options     - Advised patient to research these medications and consider for future management    Follow-up appointment in 2 months to assess response to rifampin and discuss biologic options HIDRADENITIS SUPPURATIVA   Related Medications triamcinolone  acetonide (KENALOG -40) injection 40 mg  INFLAMED SEBACEOUS CYST Right Axilla Intralesional injection - Right Axilla Procedure Note Intralesional Injection  Location: R axilla  Informed Consent: Discussed risks (infection, pain, bleeding, bruising, thinning of the skin, loss of skin pigment, lack of resolution, and recurrence of lesion) and benefits of the procedure, as well as the alternatives. Informed consent was obtained. Preparation: The area was prepared a standard fashion.  Anesthesia: n/a  Procedure Details: An intralesional injection was performed with Kenalog  40 mg/cc. 0.05 cc in total were injected.  NDC #: [id number] Exp: 02/2025  Total number of injections: 1  Plan: The patient was instructed on post-op care. Recommend OTC analgesia as needed for pain.    No follow-ups on file.    Documentation: I have reviewed the above documentation for accuracy and completeness, and I agree with the above.  I, Shirron Louanne Roussel, CMA, am acting as scribe for [employer], DO.   Louana Roup, DO

## 2024-02-07 NOTE — Patient Instructions (Addendum)
 Dear Marycatherine,  Thank you for visiting today. Here is a summary of the key instructions:  - Medications:   - Switch from doxycycline  to rifampin for flares   - Take rifampin  300mg  twice daily for 7 days when you have a flare   - Your urine may look orange - this is normal  - Skin Care:   - Continue washing with benzoyl peroxide every day   - Keep applying clindamycin  lotion every day  - Lab Tests:   - Get blood tests for:     - Quantiferon gold     - Hepatitis panel  - Follow-up:   - Come back for a follow-up visit in 2 months  - Other Instructions:   - Read about Humira and Cosentyx for hidradenitis suppurativa (HS)   - Watch for food triggers that may cause flares, such as:     - Sugar     - Red meat     - Dairy  Please reach out if you have any questions or concerns.  Warm regards,  Dr. Louana Roup, Dermatology Important Information  Due to recent changes in healthcare laws, you may see results of your pathology and/or laboratory studies on MyChart before the doctors have had a chance to review them. We understand that in some cases there may be results that are confusing or concerning to you. Please understand that not all results are received at the same time and often the doctors may need to interpret multiple results in order to provide you with the best plan of care or course of treatment. Therefore, we ask that you please give us  2 business days to thoroughly review all your results before contacting the office for clarification. Should we see a critical lab result, you will be contacted sooner.   If You Need Anything After Your Visit  If you have any questions or concerns for your doctor, please call our main line at [phone] If no one answers, please leave a voicemail as directed and we will return your call as soon as possible. Messages left after 4 pm will be answered the following business day.   You may also send us  a message via MyChart. We typically  respond to MyChart messages within 1-2 business days.  For prescription refills, please ask your pharmacy to contact our office. Our fax number is [phone].  If you have an urgent issue when the clinic is closed that cannot wait until the next business day, you can page your doctor at the number below.    Please note that while we do our best to be available for urgent issues outside of office hours, we are not available 24/7.   If you have an urgent issue and are unable to reach us , you may choose to seek medical care at your doctor's office, retail clinic, urgent care center, or emergency room.  If you have a medical emergency, please immediately call 911 or go to the emergency department. In the event of inclement weather, please call our main line at [phone] for an update on the status of any delays or closures.  Dermatology Medication Tips: Please keep the boxes that topical medications come in in order to help keep track of the instructions about where and how to use these. Pharmacies typically print the medication instructions only on the boxes and not directly on the medication tubes.   If your medication is too expensive, please contact our office at [phone] or send us  a  message through MyChart.   We are unable to tell what your co-pay for medications will be in advance as this is different depending on your insurance coverage. However, we may be able to find a substitute medication at lower cost or fill out paperwork to get insurance to cover a needed medication.   If a prior authorization is required to get your medication covered by your insurance company, please allow us  1-2 business days to complete this process.  Drug prices often vary depending on where the prescription is filled and some pharmacies may offer cheaper prices.  The website www.goodrx.com contains coupons for medications through different pharmacies. The prices here do not account for what the cost  may be with help from insurance (it may be cheaper with your insurance), but the website can give you the price if you did not use any insurance.  - You can print the associated coupon and take it with your prescription to the pharmacy.  - You may also stop by our office during regular business hours and pick up a GoodRx coupon card.  - If you need your prescription sent electronically to a different pharmacy, notify our office through [facility] or by phone at [phone]

## 2024-02-13 ENCOUNTER — Encounter (INDEPENDENT_AMBULATORY_CARE_PROVIDER_SITE_OTHER): Payer: Self-pay | Admitting: Family Medicine

## 2024-02-13 ENCOUNTER — Ambulatory Visit (INDEPENDENT_AMBULATORY_CARE_PROVIDER_SITE_OTHER): Admitting: Family Medicine

## 2024-02-13 VITALS — BP 135/76 | HR 73 | Temp 98.2°F | Ht 62.0 in | Wt 234.0 lb

## 2024-02-13 DIAGNOSIS — R7303 Prediabetes: Secondary | ICD-10-CM | POA: Diagnosis not present

## 2024-02-13 DIAGNOSIS — E559 Vitamin D deficiency, unspecified: Secondary | ICD-10-CM

## 2024-02-13 DIAGNOSIS — Z6841 Body Mass Index (BMI) 40.0 and over, adult: Secondary | ICD-10-CM

## 2024-02-13 DIAGNOSIS — E7849 Other hyperlipidemia: Secondary | ICD-10-CM

## 2024-02-13 DIAGNOSIS — F5089 Other specified eating disorder: Secondary | ICD-10-CM

## 2024-02-13 DIAGNOSIS — K5909 Other constipation: Secondary | ICD-10-CM

## 2024-02-13 DIAGNOSIS — F39 Unspecified mood [affective] disorder: Secondary | ICD-10-CM

## 2024-02-13 MED ORDER — LINACLOTIDE 290 MCG PO CAPS: 290.0000 ug | ORAL_CAPSULE | Freq: Every day | ORAL | 0 refills | Status: DC

## 2024-02-13 MED ORDER — VITAMIN D (ERGOCALCIFEROL) 1.25 MG (50000 UNIT) PO CAPS: ORAL_CAPSULE | ORAL | 0 refills | Status: DC

## 2024-02-13 MED ORDER — METFORMIN HCL 500 MG PO TABS: 500.0000 mg | ORAL_TABLET | Freq: Two times a day (BID) | ORAL | 0 refills | Status: DC

## 2024-02-13 MED ORDER — BUPROPION HCL ER (SR) 150 MG PO TB12: 150.0000 mg | ORAL_TABLET | Freq: Two times a day (BID) | ORAL | 0 refills | Status: DC

## 2024-02-13 NOTE — Progress Notes (Signed)
 Rae Bugler, D.O.  ABFM, ABOM Specializing in Clinical Bariatric Medicine  Office located at: [address] [place], [place]  [address]   Assessment and Plan:  No orders of the defined types were placed in this encounter.   There are no discontinued medications.   No orders of the defined types were placed in this encounter.     FOR THE DISEASE OF OBESITY:  BMI 40.0-44.9, adult (HCC)- current bmi 42.79 Morbid obesity (HCC)-start bmi 43.90 Assessment & Plan: Since last office visit on 12/28/2023 patient's  Muscle mass has increased by 1.4 lb. Fat mass has decreased by 1.2 lb. Total body water has not changed.  Counseling done on how various foods will affect these numbers and how to maximize success  Total lbs lost to date: 6 lbs  Total weight loss percentage to date: 2.56%    Recommended Dietary Goals Alvina is currently in the action stage of change. As such, her goal is to continue weight management plan.  She has agreed to: continue current plan   Behavioral Intervention We discussed the following today: increasing lean protein intake to established goals and work on managing stress, creating time for self-care and relaxation  Additional resources provided today: None  Evidence-based interventions for health behavior change were utilized today including the discussion of self monitoring techniques, problem-solving barriers and SMART goal setting techniques.   Regarding patient's less desirable eating habits and patterns, we employed the technique of small changes.   Pt will specifically work on: *** for next visit.    Recommended Physical Activity Goals Jarissa has been advised to work up to 300-450 minutes of moderate intensity aerobic activity a week and strengthening exercises 2-3 times per week for cardiovascular health, weight loss maintenance and preservation of muscle mass.   She has agreed to :  continue to gradually increase the amount and  intensity of exercise routine   Pharmacotherapy We both agreed to : {EMagreedrx:29170}   ASSOCIATED CONDITIONS ADDRESSED TODAY:  Other hyperlipidemia Assessment & Plan: Most recent lipid panel: Lab Results  Component Value Date   CHOL 204 (H) 09/05/2023   HDL 80 09/05/2023   LDLCALC 110 (H) 09/05/2023   LDLDIRECT 96 11/22/2022   TRIG 80 09/05/2023   CHOLHDL 2.6 09/05/2023   Is taking Crestor  10 mg daily. LDL is not at goal. Recommended LDL goal is <100 to reduce the risk of fatty streaks and the progression to obstructive ASCVD in the future. Limit red meat intake to no more than two days a week  Maintain with same regimen. Continue diet low in saturated fats and trans fats. Order labs today.    Pre-diabetes Assessment & Plan: Most recent A1c and fasting insulin : Lab Results  Component Value Date   HGBA1C 6.0 (H) 09/05/2023   HGBA1C 5.8 (H) 03/31/2023   HGBA1C 6.1 (H) 11/22/2022   INSULIN  13.1 09/05/2023   INSULIN  28.8 (H) 03/31/2023   INSULIN  9.1 03/30/2022      Vitamin D  deficiency Assessment & Plan: Most recent Vitamin D :  Lab Results  Component Value Date   VD25OH 33.1 09/05/2023   VD25OH 63.6 03/31/2023   VD25OH 43.7 11/22/2022   Pt reports good compliance and tolerance of strength vitamin D  twice weekly. Maintain with current regimen. Will order labs today.    Other constipation Assessment & Plan: States that her symptoms are well controlled on Linzess  290 mcg daily. Continue Linzess , adequate daily water & fiber intake, and increase activity as tolerated.  Mood disorder - emotional eating Assessment & Plan: Relevant medication: Wellbutrin  SR 100 mg twice daily.   Stil lhave marital discourse but husband and her still living together and exercising together etc.   Emotionally better since lov     Takign wellbutrin  twice daily; started last OV Did not find counselor yet.  Difficulties with husband.     Sleepinmg  better feeling more hopeful still feeling anxious at times.  Sleeps 8-9 hrs of sleep  Goal: focusig/taking care of herself, going to counselour      Follow up:   No follow-ups on file. She was informed of the importance of frequent follow up visits to maximize her success with intensive lifestyle modifications for her multiple health conditions.  Subjective:   Chief complaint: Obesity Evamae is here to discuss her progress with her obesity treatment plan. She is on the Category 1 Plan and states she is following her eating plan approximately 75% of the time. She states she is walking 60 minutes 6 days per week.  Interval History:  Amnah Esplin is here for a follow up office visit. Since last OV on 12/28/2023, Mrs.Morace weight has not changed. Food wise, she acknowledges increased fatty meat consumption d/t several celebratory events last month and this month. She is doing well with her exercise regimen.   Pharmacotherapy that aid with  weight loss: She is currently taking Wellbutrin  SR 100 mg 2 times daily and Metformin  500 mg 2 times daily with reported good compliance and tolerance.   Review of Systems:  Pertinent positives were addressed with patient today.  Reviewed by clinician on day of visit: allergies, medications, problem list, medical history, surgical history, family history, social history, and previous encounter notes.  Weight Summary and Biometrics   Weight Lost Since Last Visit: 0  Weight Gained Since Last Visit: 0   Vitals Temp: 98.2 F (36.8 C) BP: 135/76 Pulse Rate: 73 SpO2: 99 %   Anthropometric Measurements Height: 5\' 2"  (1.575 m) Weight: 234 lb (106.1 kg) BMI (Calculated): 42.79 Weight at Last Visit: 234lb Weight Lost Since Last Visit: 0 Weight Gained Since Last Visit: 0 Starting Weight: 240lb Total Weight Loss (lbs): 6 lb (2.722 kg) Peak Weight: 242lb   Body Composition  Body Fat %: 50.4 % Fat Mass (lbs): 118.2 lbs Muscle Mass  (lbs): 110.6 lbs Total Body Water (lbs): 81.4 lbs Visceral Fat Rating : 17   Other Clinical Data Fasting: no Labs: yes Today's Visit #: 47 Starting Date: 04/14/20   Objective:   PHYSICAL EXAM: Blood pressure 135/76, pulse 73, temperature 98.2 F (36.8 C), height 5\' 2"  (1.575 m), weight 234 lb (106.1 kg), SpO2 99%. Body mass index is 42.8 kg/m.  General: she is overweight, cooperative and in no acute distress. PSYCH: Has normal mood, affect and thought process.   HEENT: EOMI, sclerae are anicteric. Lungs: Normal breathing effort, no conversational dyspnea. Extremities: Moves * 4 Neurologic: A and O * 3, good insight  DIAGNOSTIC DATA REVIEWED: BMET    Component Value Date/Time   NA 142 09/05/2023 1159   K 4.4 09/05/2023 1159   CL 104 09/05/2023 1159   CO2 20 09/05/2023 1159   GLUCOSE 92 09/05/2023 1159   GLUCOSE 95 11/12/2019 0930   BUN 12 09/05/2023 1159   CREATININE 0.97 09/05/2023 1159   CALCIUM  9.5 09/05/2023 1159   GFRNONAA 91 04/15/2020 1654   GFRAA 105 04/15/2020 1654   Lab Results  Component Value Date   HGBA1C 6.0 (H) 09/05/2023  HGBA1C 6.1 (H) 04/15/2020   Lab Results  Component Value Date   INSULIN  13.1 09/05/2023   INSULIN  21.9 04/15/2020   Lab Results  Component Value Date   TSH 0.95 12/30/2022   CBC    Component Value Date/Time   WBC 7.2 12/30/2022 1429   RBC 5.91 (H) 12/30/2022 1429   HGB 12.7 12/30/2022 1429   HGB 12.5 03/30/2022 1049   HCT 39.6 12/30/2022 1429   HCT 39.5 03/30/2022 1049   PLT 190.0 12/30/2022 1429   PLT 236 03/30/2022 1049   MCV 67.1 Repeated and verified X2. (L) 12/30/2022 1429   MCV 68 (L) 03/30/2022 1049   MCH 21.6 (L) 03/30/2022 1049   MCH 21.7 (L) 09/25/2016 0012   MCHC 32.0 12/30/2022 1429   RDW 17.7 (H) 12/30/2022 1429   RDW 19.0 (H) 03/30/2022 1049   Iron  Studies    Component Value Date/Time   IRON  62 12/30/2022 1429   IRON  63 11/03/2021 0942   TIBC 365.4 12/30/2022 1429   TIBC 332 11/03/2021  0942   FERRITIN 38.7 12/30/2022 1429   FERRITIN 72 11/03/2021 0942   IRONPCTSAT 17.0 (L) 12/30/2022 1429   IRONPCTSAT 19 11/03/2021 0942   Lipid Panel     Component Value Date/Time   CHOL 204 (H) 09/05/2023 1159   TRIG 80 09/05/2023 1159   HDL 80 09/05/2023 1159   CHOLHDL 2.6 09/05/2023 1159   CHOLHDL 3 11/06/2010 1638   VLDL 20.8 11/06/2010 1638   LDLCALC 110 (H) 09/05/2023 1159   LDLDIRECT 96 11/22/2022 1446   Hepatic Function Panel     Component Value Date/Time   PROT 6.0 11/22/2022 1446   ALBUMIN 3.7 (L) 11/22/2022 1446   AST 14 11/22/2022 1446   ALT 17 11/22/2022 1446   ALKPHOS 76 11/22/2022 1446   BILITOT <0.2 11/22/2022 1446   BILIDIR 0.0 01/30/2013 1213      Component Value Date/Time   TSH 0.95 12/30/2022 1429   Nutritional Lab Results  Component Value Date   VD25OH 33.1 09/05/2023   VD25OH 63.6 03/31/2023   VD25OH 43.7 11/22/2022    Attestations:   I, Special Puri , acting as a Stage manager for [employer], DO., have compiled all relevant documentation for today's office visit on behalf of Marceil Sensor, DO, while in the presence of [employer], DO.   I have reviewed the above documentation for accuracy and completeness, and I agree with the above. Rae Bugler, D.O.  The 21st Century Cures Act was signed into law in 2016 which includes the topic of electronic health records.  This provides immediate access to information in MyChart.  This includes consultation notes, operative notes, office notes, lab results and pathology reports.  If you have any questions about what you read please let us  know at your next visit so we can discuss your concerns and take corrective action if need be.  We are right here with you.

## 2024-04-09 ENCOUNTER — Ambulatory Visit (INDEPENDENT_AMBULATORY_CARE_PROVIDER_SITE_OTHER): Admitting: Dermatology

## 2024-04-09 ENCOUNTER — Encounter: Payer: Self-pay | Admitting: Dermatology

## 2024-04-09 VITALS — BP 103/69

## 2024-04-09 DIAGNOSIS — L732 Hidradenitis suppurativa: Secondary | ICD-10-CM

## 2024-04-09 MED ORDER — TACROLIMUS 0.1 % EX OINT: TOPICAL_OINTMENT | Freq: Two times a day (BID) | CUTANEOUS | 3 refills | Status: DC

## 2024-04-09 MED ORDER — RIFAMPIN 300 MG PO CAPS: 300.0000 mg | ORAL_CAPSULE | Freq: Two times a day (BID) | ORAL | 1 refills | Status: AC

## 2024-04-09 MED ORDER — CLINDAMYCIN PHOSPHATE 1 % EX LOTN: TOPICAL_LOTION | Freq: Every day | CUTANEOUS | 6 refills | Status: AC

## 2024-04-09 NOTE — Progress Notes (Unsigned)
   Follow Up Visit   Subjective  Molly Bean is a 63 y.o. female who presents for the following: Hidradenitis follow up - she is doing better. She has only had one flare since her last appointment and she took rifampin  twice daily for a week until the flare was over. She is washing BP wash 3 times per week. She is not using clindamycin  lotion- she is out.    The following portions of the chart were reviewed this encounter and updated as appropriate: medications, allergies, medical history  Review of Systems:  No other skin or systemic complaints except as noted in HPI or Assessment and Plan.  Objective  Well appearing patient in no apparent distress; mood and affect are within normal limits.  A focused examination was performed of the following areas: axillary areas   Relevant exam findings are noted in the Assessment and Plan.    Assessment & Plan   HIDRADENITIS SUPPURATIVA   Related Procedures QuantiFERON-TB Gold Plus  HIDRADENITIS SUPPURATIVA Exam: ***    Treatment Plan: Continue Panoxyl wash 3 times per week and clindamycin  lotion daily.  Continue Rifampin  300 mg 1 twice daily x 7 days for flares.   Return in about 6 months (around 10/10/2024) for Follow up.  I, Molly Bean, CMA, am acting as scribe for [employer], DO .   Documentation: I have reviewed the above documentation for accuracy and completeness, and I agree with the above.  Molly Lenis, DO

## 2024-04-09 NOTE — Patient Instructions (Addendum)
 Date: Mon Apr 09 2024  Dear Molly Bean,  Thank you for visiting today. Here is a summary of the key instructions:  - Medications:   - Continue using benzoyl peroxide wash daily   - Apply clindamycin  topically every day   - Use rifampin  when you have a flare-up  - Prescriptions:   - New prescription for clindamycin  with 6 refills   - Refill for rifampin   - Lifestyle Changes:   - Practice stress management techniques:     - Try meditation     - Exercise regularly     - Find physical and mental ways to relieve stress  - Lab Tests:   - Complete lab work, including Arboriculturist, at your convenience  - Follow-up:   - Return for a follow-up appointment in 6 months   - Come in sooner if you have an emergency  Please reach out if you have any questions or concerns.  Best Regards,  Dr. Delon Lenis, Dermatology   PLASTIC SURGEON Estefana Reichert Dillingham, DO Address: [address], [place], [place] [address] Phone: [phone]    Important Information  Due to recent changes in healthcare laws, you may see results of your pathology and/or laboratory studies on MyChart before the doctors have had a chance to review them. We understand that in some cases there may be results that are confusing or concerning to you. Please understand that not all results are received at the same time and often the doctors may need to interpret multiple results in order to provide you with the best plan of care or course of treatment. Therefore, we ask that you please give us  2 business days to thoroughly review all your results before contacting the office for clarification. Should we see a critical lab result, you will be contacted sooner.   If You Need Anything After Your Visit  If you have any questions or concerns for your doctor, please call our main line at [phone] If no one answers, please leave a voicemail as directed and we will return your call as soon as possible.  Messages left after 4 pm will be answered the following business day.   You may also send us  a message via MyChart. We typically respond to MyChart messages within 1-2 business days.  For prescription refills, please ask your pharmacy to contact our office. Our fax number is [phone].  If you have an urgent issue when the clinic is closed that cannot wait until the next business day, you can page your doctor at the number below.    Please note that while we do our best to be available for urgent issues outside of office hours, we are not available 24/7.   If you have an urgent issue and are unable to reach us , you may choose to seek medical care at your doctor's office, retail clinic, urgent care center, or emergency room.  If you have a medical emergency, please immediately call 911 or go to the emergency department. In the event of inclement weather, please call our main line at [phone] for an update on the status of any delays or closures.  Dermatology Medication Tips: Please keep the boxes that topical medications come in in order to help keep track of the instructions about where and how to use these. Pharmacies typically print the medication instructions only on the boxes and not directly on the medication tubes.   If your medication is too expensive, please contact our office at [phone]  or send us  a message through MyChart.   We are unable to tell what your co-pay for medications will be in advance as this is different depending on your insurance coverage. However, we may be able to find a substitute medication at lower cost or fill out paperwork to get insurance to cover a needed medication.   If a prior authorization is required to get your medication covered by your insurance company, please allow us  1-2 business days to complete this process.  Drug prices often vary depending on where the prescription is filled and some pharmacies may offer cheaper prices.  The  website www.goodrx.com contains coupons for medications through different pharmacies. The prices here do not account for what the cost may be with help from insurance (it may be cheaper with your insurance), but the website can give you the price if you did not use any insurance.  - You can print the associated coupon and take it with your prescription to the pharmacy.  - You may also stop by our office during regular business hours and pick up a GoodRx coupon card.  - If you need your prescription sent electronically to a different pharmacy, notify our office through [facility] or by phone at [phone]

## 2024-04-12 ENCOUNTER — Ambulatory Visit (INDEPENDENT_AMBULATORY_CARE_PROVIDER_SITE_OTHER): Admitting: Family Medicine

## 2024-04-19 ENCOUNTER — Encounter: Payer: Self-pay | Admitting: Emergency Medicine

## 2024-04-19 ENCOUNTER — Ambulatory Visit: Admitting: Emergency Medicine

## 2024-04-19 ENCOUNTER — Ambulatory Visit: Payer: Self-pay

## 2024-04-19 ENCOUNTER — Other Ambulatory Visit: Payer: Self-pay

## 2024-04-19 VITALS — BP 128/92 | HR 64 | Temp 98.0°F | Ht 62.0 in | Wt 241.2 lb

## 2024-04-19 DIAGNOSIS — M7918 Myalgia, other site: Secondary | ICD-10-CM

## 2024-04-19 DIAGNOSIS — M79651 Pain in right thigh: Secondary | ICD-10-CM

## 2024-04-19 MED ORDER — ACETAMINOPHEN-CODEINE 300-30 MG PO TABS: 1.0000 | ORAL_TABLET | Freq: Four times a day (QID) | ORAL | 0 refills | Status: AC | PRN

## 2024-04-19 NOTE — Patient Instructions (Signed)
 Sciatica  Sciatica is pain, weakness, tingling, or loss of feeling (numbness) along the sciatic nerve. The sciatic nerve starts in the lower back and goes down the back of each leg. Sciatica usually affects one side of the body. Sciatica usually goes away on its own or with treatment. Sometimes, sciatica may come back. What are the causes? This condition happens when the sciatic nerve is pinched or has pressure put on it. This may be caused by: A disk in between the bones of the spine bulging out too far (herniated disk). Changes in the spinal disks due to aging. A condition that affects a muscle in the butt. Extra bone growth near the sciatic nerve. A break (fracture) of the area between your hip bones (pelvis). Pregnancy. Tumor. This is rare. What increases the risk? You are more likely to develop this condition if you: Play sports that put pressure or stress on the spine. Have poor strength and ease of movement (flexibility). Have had a back injury or back surgery. Sit for long periods of time. Do activities that involve bending or lifting over and over again. Are very overweight (obese). What are the signs or symptoms? Symptoms can vary from mild to very bad. They may include: Any of these problems in the lower back, leg, hip, or butt: Mild tingling, loss of feeling, or dull aches. A burning feeling. Sharp pains. Loss of feeling in the back of the calf or the sole of the foot. Leg weakness. Very bad back pain that makes it hard to move. These symptoms may get worse when you cough, sneeze, or laugh. They may also get worse when you sit or stand for long periods of time. How is this treated? This condition often gets better without any treatment. However, treatment may include: Changing or cutting back on physical activity when you have pain. Exercising, including strengthening and stretching. Putting ice or heat on the affected area. Shots of medicines to relieve pain and  swelling or to relax your muscles. Surgery. Follow these instructions at home: Medicines Take over-the-counter and prescription medicines only as told by your doctor. Ask your doctor if you should avoid driving or using machines while you are taking your medicine. Managing pain     If told, put ice on the affected area. To do this: Put ice in a plastic bag. Place a towel between your skin and the bag. Leave the ice on for 20 minutes, 2-3 times a day. If your skin turns bright red, take off the ice right away to prevent skin damage. The risk of skin damage is higher if you cannot feel pain, heat, or cold. If told, put heat on the affected area. Do this as often as told by your doctor. Use the heat source that your doctor tells you to use, such as a moist heat pack or a heating pad. Place a towel between your skin and the heat source. Leave the heat on for 20-30 minutes. If your skin turns bright red, take off the heat right away to prevent burns. The risk of burns is higher if you cannot feel pain, heat, or cold. Activity  Return to your normal activities when your doctor says that it is safe. Avoid activities that make your symptoms worse. Take short rests during the day. When you rest for a long time, do some physical activity or stretching between periods of rest. Avoid sitting for a long time without moving. Get up and move around at least one time each  hour. Do exercises and stretches as told by your doctor. Do not lift anything that is heavier than 10 lb (4.5 kg). Avoid lifting heavy things even when you do not have symptoms. Avoid lifting heavy things over and over. When you lift objects, always lift in a way that is safe for your body. To do this, you should: Bend your knees. Keep the object close to your body. Avoid twisting. General instructions Stay at a healthy weight. Wear comfortable shoes that support your feet. Avoid wearing high heels. Avoid sleeping on a mattress  that is too soft or too hard. You might have less pain if you sleep on a mattress that is firm enough to support your back. Contact a doctor if: Your pain is not controlled by medicine. Your pain does not get better. Your pain gets worse. Your pain lasts longer than 4 weeks. You lose weight without trying. Get help right away if: You cannot control when you pee (urinate) or poop (have a bowel movement). You have weakness in any of these areas and it gets worse: Lower back. The area between your hip bones. Butt. Legs. You have redness or swelling of your back. You have a burning feeling when you pee. Summary Sciatica is pain, weakness, tingling, or loss of feeling (numbness) along the sciatic nerve. This may include the lower back, legs, hips, and butt. This condition happens when the sciatic nerve is pinched or has pressure put on it. Treatment often includes rest, exercise, medicines, and putting ice or heat on the affected area. This information is not intended to replace advice given to you by your health care provider. Make sure you discuss any questions you have with your health care provider. Document Revised: 12/28/2021 Document Reviewed: 12/28/2021 Elsevier Patient Education  2024 [employer].

## 2024-04-19 NOTE — Progress Notes (Signed)
 Molly Bean 63 y.o.   Chief Complaint  Patient presents with   Pain    Pain around the right hip area, been for about two weeks, pt been taking tylenol  arthritis pain medication which did not helped, pt stated she took some of her brother oxycodone which did seem to help, Depending on certain movement there is sharp shooting pain     HISTORY OF PRESENT ILLNESS: This is a 63 y.o. female complaining of pain to back of right thigh that started about 2 weeks ago Denies any injury.  No associated symptoms. Has history of arthritis.  Took over-the-counter Tylenol  arthritis medication without relief.  HPI   Prior to Admission medications   Medication Sig Start Date End Date Taking? Authorizing Provider  acetaminophen  (TYLENOL ) 500 MG tablet Take 1,000 mg by mouth every 6 (six) hours as needed for mild pain or headache.   Yes [provider]  albuterol  (VENTOLIN  HFA) 108 (90 Base) MCG/ACT inhaler TAKE 2 PUFFS BY MOUTH EVERY 6 HOURS AS NEEDED FOR WHEEZE OR SHORTNESS OF BREATH 12/07/21  Yes Opalski, Barnie, DO  buPROPion  (WELLBUTRIN  SR) 150 MG 12 hr tablet Take 1 tablet (150 mg total) by mouth 2 (two) times daily. 02/13/24  Yes Opalski, Barnie, DO  clindamycin  (CLEOCIN -T) 1 % lotion Apply topically daily. 04/09/24 04/09/25 Yes Alm Delon SAILOR, DO  diazepam  (VALIUM ) 5 MG tablet Place 1 tablet vaginally nightly as needed for muscle spasm/ pelvic pain. 05/18/22  Yes Marilynne Rosaline SAILOR, MD  estradiol  (ESTRACE ) 0.1 MG/GM vaginal cream Place 0.5g nightly for two weeks then twice a week after 01/13/22  Yes Marilynne Rosaline SAILOR, MD  Ferrous Sulfate (IRON ) 325 (65 Fe) MG TABS Take 1 tablet (325 mg total) by mouth daily at 2 PM. 09/16/22  Yes Opalski, Barnie, DO  lidocaine -prilocaine  (EMLA ) cream Apply 1 application. topically as needed. 01/13/22  Yes Marilynne Rosaline SAILOR, MD  linaclotide  (LINZESS ) 290 MCG CAPS capsule Take 1 capsule (290 mcg total) by mouth daily before breakfast. 02/13/24  Yes  Opalski, Barnie, DO  metFORMIN  (GLUCOPHAGE ) 500 MG tablet Take 1 tablet (500 mg total) by mouth 2 (two) times daily with a meal. 02/13/24  Yes Opalski, Barnie, DO  minocycline  (DYNACIN ) 50 MG tablet Take 1 tablet (50 mg total) by mouth 2 (two) times daily. 01/13/23  Yes Joshua Debby CROME, MD  mirabegron  ER (MYRBETRIQ ) 25 MG TB24 tablet Take 1 tablet (25 mg total) by mouth daily. 09/02/21  Yes Marilynne Rosaline SAILOR, MD  rifampin  (RIFADIN ) 300 MG capsule Take 1 capsule (300 mg total) by mouth 2 (two) times daily. Take 1 capsule (300 mg total) by mouth 2 (two) times daily. Take for 7 days PRN for flares 04/09/24  Yes Alm Delon SAILOR, DO  rosuvastatin  (CRESTOR ) 10 MG tablet Take 1 tablet (10 mg total) by mouth daily. 01/13/23  Yes Joshua Debby CROME, MD  Suvorexant  (BELSOMRA ) 15 MG TABS Take 1 tablet (15 mg total) by mouth at bedtime as needed. 01/13/23  Yes Joshua Debby CROME, MD  Vitamin D , Ergocalciferol , (DRISDOL ) 1.25 MG (50000 UNIT) CAPS capsule 1 po q wed, and 1 po q sun 02/13/24  Yes Opalski, Deborah, DO    Allergies  Allergen Reactions   Shellfish Allergy  Anaphylaxis   Hydrocodone  Other (See Comments)   Oxycodone     Hallucinations   Pennsaid  [Diclofenac  Sodium] Rash    Patient Active Problem List   Diagnosis Date Noted   Other hyperlipidemia 01/04/2023   Other constipation 01/04/2023   BMI  39.0-39.9,adult-current bmi 39.5 01/04/2023   Encounter for general adult medical examination with abnormal findings 12/30/2022   Need for hepatitis C screening test 12/30/2022   Chronic idiopathic constipation 12/30/2022   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   DOE (dyspnea on exertion) 12/30/2022   Dyslipidemia, goal LDL below 130 12/30/2022   Morbid obesity (HCC)-start bmi 43.90 11/25/2022   Axillary hidradenitis suppurativa 07/07/2021   Iron  deficiency anemia 12/10/2020   Prediabetes 08/06/2020   Vitamin D  deficiency 08/06/2020   Essential hypertension 05/01/2018   Asthma 03/14/2018    Hidradenitis suppurativa 01/18/2018   GAD (generalized anxiety disorder) 10/11/2016   Gastroesophageal reflux disease 04/12/2007    Past Medical History:  Diagnosis Date   Anxiety    Constipation    DEPRESSION    edema lower extremeties    GERD    Joint pain    MENOPAUSAL SYNDROME    Multiple food allergies    OBESITY    Shortness of breath    Sleep apnea    wears CPAP    Past Surgical History:  Procedure Laterality Date   ABDOMINAL HYSTERECTOMY  11/1998   total fibroids   CESAREAN SECTION      Social History   Socioeconomic History   Marital status: Married    Spouse name: Vinie   Number of children: Not on file   Years of education: Not on file   Highest education level: Not on file  Occupational History   Occupation: Psychologist, prison and probation services for [employer]   Tobacco Use   Smoking status: Former    Current packs/day: 0.00    Average packs/day: 0.3 packs/day for 20.0 years (6.0 ttl pk-yrs)    Types: Cigarettes    Start date: 10/04/1957    Quit date: 10/04/1977    Years since quitting: 46.5   Smokeless tobacco: Former    Quit date: 10/05/1987  Vaping Use   Vaping status: Never Used  Substance and Sexual Activity   Alcohol use: No   Drug use: No   Sexual activity: Yes    Birth control/protection: Surgical  Other Topics Concern   Not on file  Social History Narrative   Not on file   Social Drivers of Health   Financial Resource Strain: Not on file  Food Insecurity: Not on file  Transportation Needs: Not on file  Physical Activity: Not on file  Stress: Not on file  Social Connections: Unknown (02/16/2022)   [facility]   Social Network    Social Network: Not on file  Intimate Partner Violence: Unknown (01/08/2022)   Received from [facility]   HITS    Physically Hurt: Not on file    Insult or Talk Down To: Not on file    Threaten Physical Harm: Not on file    Scream or Curse: Not on file    Family History  Problem Relation Age of  Onset   Coronary artery disease Mother    Hypertension Mother    Diabetes Mother    Atrial fibrillation Mother    Sleep apnea Mother    Obesity Mother    Seizures Father    Stroke Father    Lupus Cousin    Breast cancer Sister 26     Review of Systems  Constitutional: Negative.  Negative for chills and fever.  HENT: Negative.  Negative for congestion and sore throat.   Respiratory: Negative.  Negative for cough and shortness of breath.   Cardiovascular: Negative.  Negative for chest  pain and palpitations.  Gastrointestinal:  Negative for abdominal pain, diarrhea, nausea and vomiting.  Genitourinary: Negative.  Negative for dysuria and hematuria.  Skin: Negative.  Negative for rash.  Neurological:  Negative for dizziness and headaches.    Today's Vitals   04/19/24 1332  BP: (!) 128/92  Pulse: 64  Temp: 98 F (36.7 C)  TempSrc: Temporal  SpO2: 94%  Weight: 241 lb 4 oz (109.4 kg)  Height: 5' 2 (1.575 m)   Body mass index is 44.13 kg/m.   Physical Exam Vitals reviewed.  Constitutional:      Appearance: Normal appearance. She is obese.  HENT:     Head: Normocephalic.  Eyes:     Extraocular Movements: Extraocular movements intact.  Cardiovascular:     Rate and Rhythm: Normal rate.  Pulmonary:     Effort: Pulmonary effort is normal.  Abdominal:     Palpations: Abdomen is soft.     Tenderness: There is no abdominal tenderness.  Musculoskeletal:     Comments: Right lower extremity: No hip tenderness.  Mild tenderness to deep palpation posterior thigh.  No other abnormal findings.  Skin:    General: Skin is warm and dry.     Capillary Refill: Capillary refill takes less than 2 seconds.  Neurological:     Mental Status: She is alert and oriented to person, place, and time.  Psychiatric:        Mood and Affect: Mood normal.        Behavior: Behavior normal.      ASSESSMENT & PLAN: A total of 33 minutes was spent with the patient and counseling/coordination  of care regarding preparing for this visit, review of most recent office visit notes, review of all medications and chronic medical conditions under management, differential diagnosis of right thigh pain and need for workup including ultrasound of the leg, need for sports medicine evaluation, pain management, prognosis, documentation and need for follow-up with PCP.  Problem List Items Addressed This Visit       Other   Musculoskeletal pain   Most likely musculoskeletal pain Pain management discussed May use Tylenol  for mild to moderate pain and Tylenol  with codeine  for moderate to severe pain      Right thigh pain - Primary   Clinically stable.  No red flag signs or symptoms Seems to be musculoskeletal Differential diagnosis discussed Recommend right lower extremity ultrasound to rule out DVT Recommend sports medicine evaluation Referral placed today Pain management discussed Tylenol  for mild to moderate pain and Tylenol  with codeine  for moderate to severe pain Recommend follow-up with PCP within the next 2 weeks.      Relevant Medications   acetaminophen -codeine  (TYLENOL  #3) 300-30 MG tablet   Other Relevant Orders   [facility] LOWER EXTREMITY VENOUS DVT RIGHT (BACK OFFICE)   Ambulatory referral to Sports Medicine   Patient Instructions  Sciatica  Sciatica is pain, weakness, tingling, or loss of feeling (numbness) along the sciatic nerve. The sciatic nerve starts in the lower back and goes down the back of each leg. Sciatica usually affects one side of the body. Sciatica usually goes away on its own or with treatment. Sometimes, sciatica may come back. What are the causes? This condition happens when the sciatic nerve is pinched or has pressure put on it. This may be caused by: A disk in between the bones of the spine bulging out too far (herniated disk). Changes in the spinal disks due to aging. A condition that affects a muscle  in the butt. Extra bone growth near the sciatic  nerve. A break (fracture) of the area between your hip bones (pelvis). Pregnancy. Tumor. This is rare. What increases the risk? You are more likely to develop this condition if you: Play sports that put pressure or stress on the spine. Have poor strength and ease of movement (flexibility). Have had a back injury or back surgery. Sit for long periods of time. Do activities that involve bending or lifting over and over again. Are very overweight (obese). What are the signs or symptoms? Symptoms can vary from mild to very bad. They may include: Any of these problems in the lower back, leg, hip, or butt: Mild tingling, loss of feeling, or dull aches. A burning feeling. Sharp pains. Loss of feeling in the back of the calf or the sole of the foot. Leg weakness. Very bad back pain that makes it hard to move. These symptoms may get worse when you cough, sneeze, or laugh. They may also get worse when you sit or stand for long periods of time. How is this treated? This condition often gets better without any treatment. However, treatment may include: Changing or cutting back on physical activity when you have pain. Exercising, including strengthening and stretching. Putting ice or heat on the affected area. Shots of medicines to relieve pain and swelling or to relax your muscles. Surgery. Follow these instructions at home: Medicines Take over-the-counter and prescription medicines only as told by your doctor. Ask your doctor if you should avoid driving or using machines while you are taking your medicine. Managing pain     If told, put ice on the affected area. To do this: Put ice in a plastic bag. Place a towel between your skin and the bag. Leave the ice on for 20 minutes, 2-3 times a day. If your skin turns bright red, take off the ice right away to prevent skin damage. The risk of skin damage is higher if you cannot feel pain, heat, or cold. If told, put heat on the affected area.  Do this as often as told by your doctor. Use the heat source that your doctor tells you to use, such as a moist heat pack or a heating pad. Place a towel between your skin and the heat source. Leave the heat on for 20-30 minutes. If your skin turns bright red, take off the heat right away to prevent burns. The risk of burns is higher if you cannot feel pain, heat, or cold. Activity  Return to your normal activities when your doctor says that it is safe. Avoid activities that make your symptoms worse. Take short rests during the day. When you rest for a long time, do some physical activity or stretching between periods of rest. Avoid sitting for a long time without moving. Get up and move around at least one time each hour. Do exercises and stretches as told by your doctor. Do not lift anything that is heavier than 10 lb (4.5 kg). Avoid lifting heavy things even when you do not have symptoms. Avoid lifting heavy things over and over. When you lift objects, always lift in a way that is safe for your body. To do this, you should: Bend your knees. Keep the object close to your body. Avoid twisting. General instructions Stay at a healthy weight. Wear comfortable shoes that support your feet. Avoid wearing high heels. Avoid sleeping on a mattress that is too soft or too hard. You might have less pain  if you sleep on a mattress that is firm enough to support your back. Contact a doctor if: Your pain is not controlled by medicine. Your pain does not get better. Your pain gets worse. Your pain lasts longer than 4 weeks. You lose weight without trying. Get help right away if: You cannot control when you pee (urinate) or poop (have a bowel movement). You have weakness in any of these areas and it gets worse: Lower back. The area between your hip bones. Butt. Legs. You have redness or swelling of your back. You have a burning feeling when you pee. Summary Sciatica is pain, weakness,  tingling, or loss of feeling (numbness) along the sciatic nerve. This may include the lower back, legs, hips, and butt. This condition happens when the sciatic nerve is pinched or has pressure put on it. Treatment often includes rest, exercise, medicines, and putting ice or heat on the affected area. This information is not intended to replace advice given to you by your health care provider. Make sure you discuss any questions you have with your health care provider. Document Revised: 12/28/2021 Document Reviewed: 12/28/2021 Elsevier Patient Education  2024 [employer]     Emil Schaumann, MD [facility]

## 2024-04-19 NOTE — Telephone Encounter (Signed)
 FYI Only or Action Required?: FYI only for provider.  Patient was last seen in primary care on 02/13/2024 by Midge Sober, DO.  Called Nurse Triage reporting Hip Pain.  Symptoms began a week ago.  Interventions attempted: OTC medications: Tylenol  arthritis .  Symptoms are: unchanged.  Triage Disposition: See HCP Within 4 Hours (Or PCP Triage)  Patient/caregiver understands and will follow disposition?: Yes  Pt unsure if she has pinched nerve or not but pain not getting better and taking Tylenol  before bed and still in severe pain when getting up in mornings. No appts with PCP, scheduled with Sargardia today at 140.   Copied from CRM [id number]. Topic: Clinical - Red Word Triage >> Apr 19, 2024  9:34 AM Deleta RAMAN wrote: Red Word that prompted transfer to Nurse Triage: Patient is calling to schedule an appointment mentioned she has serve hip pain causing trouble while walking and movement. Patient currently taking medication that is not working for the pain. Reason for Disposition  [1] SEVERE pain (e.g., excruciating, unable to do any normal activities) AND [2] not improved after 2 hours of pain medicine  Answer Assessment - Initial Assessment Questions 1. LOCATION and RADIATION: Where is the pain located? Does the pain spread (shoot) anywhere else?     R hip and upper leg  3. SEVERITY: How bad is the pain? What does it keep you from doing?   (Scale 1-10; or mild, moderate, severe)     10/10 4. ONSET: When did the pain start? Does it come and go, or is it there all the time?     1 week  5. WORK OR EXERCISE: Has there been any recent work or exercise that involved this part of the body?      no 6. CAUSE: What do you think is causing the hip pain?      Unsure if pinched nerve  7. AGGRAVATING FACTORS: What makes the hip pain worse? (e.g., walking, climbing stairs, running)     Can move a certain way and causes severe pain  8. OTHER SYMPTOMS: Do you have any other  symptoms? (e.g., back pain, pain shooting down leg,  fever, rash)     Possible swelling   Tylenol  Arthritis  Protocols used: Hip Pain-A-AH

## 2024-04-19 NOTE — Assessment & Plan Note (Signed)
 Most likely musculoskeletal pain Pain management discussed May use Tylenol  for mild to moderate pain and Tylenol  with codeine  for moderate to severe pain

## 2024-04-19 NOTE — Assessment & Plan Note (Signed)
 Clinically stable.  No red flag signs or symptoms Seems to be musculoskeletal Differential diagnosis discussed Recommend right lower extremity ultrasound to rule out DVT Recommend sports medicine evaluation Referral placed today Pain management discussed Tylenol  for mild to moderate pain and Tylenol  with codeine  for moderate to severe pain Recommend follow-up with PCP within the next 2 weeks.

## 2024-04-25 NOTE — Progress Notes (Unsigned)
 Ben Jackson D[facility] [address] [place] [address] Phone: [phone]   Assessment and Plan:     There are no diagnoses linked to this encounter.  ***   Pertinent previous records reviewed include ***    Follow Up: ***     Subjective:   I, Molly Bean, am serving as a Neurosurgeon for Doctor Morene Mace  Chief Complaint: right thigh pain   HPI:   04/26/2024 Patient is a 63 year old female with right thigh pain. Patient states   Relevant Historical Information: ***  Additional pertinent review of systems negative.   Current Outpatient Medications:    acetaminophen  (TYLENOL ) 500 MG tablet, Take 1,000 mg by mouth every 6 (six) hours as needed for mild pain or headache., Disp: , Rfl:    acetaminophen -codeine  (TYLENOL  #3) 300-30 MG tablet, Take 1-2 tablets by mouth every 6 (six) hours as needed for moderate pain (pain score 4-6)., Disp: 15 tablet, Rfl: 0   albuterol  (VENTOLIN  HFA) 108 (90 Base) MCG/ACT inhaler, TAKE 2 PUFFS BY MOUTH EVERY 6 HOURS AS NEEDED FOR WHEEZE OR SHORTNESS OF BREATH, Disp: 8.5 g, Rfl: 0   buPROPion  (WELLBUTRIN  SR) 150 MG 12 hr tablet, Take 1 tablet (150 mg total) by mouth 2 (two) times daily., Disp: 180 tablet, Rfl: 0   clindamycin  (CLEOCIN -T) 1 % lotion, Apply topically daily., Disp: 60 mL, Rfl: 6   diazepam  (VALIUM ) 5 MG tablet, Place 1 tablet vaginally nightly as needed for muscle spasm/ pelvic pain., Disp: 40 tablet, Rfl: 0   estradiol  (ESTRACE ) 0.1 MG/GM vaginal cream, Place 0.5g nightly for two weeks then twice a week after, Disp: 30 g, Rfl: 11   Ferrous Sulfate (IRON ) 325 (65 Fe) MG TABS, Take 1 tablet (325 mg total) by mouth daily at 2 PM., Disp: 30 tablet, Rfl: 0   lidocaine -prilocaine  (EMLA ) cream, Apply 1 application. topically as needed., Disp: 30 g, Rfl: 5   linaclotide  (LINZESS ) 290 MCG CAPS capsule, Take 1 capsule (290 mcg total) by mouth daily before breakfast., Disp: 90 capsule, Rfl:  0   metFORMIN  (GLUCOPHAGE ) 500 MG tablet, Take 1 tablet (500 mg total) by mouth 2 (two) times daily with a meal., Disp: 180 tablet, Rfl: 0   minocycline  (DYNACIN ) 50 MG tablet, Take 1 tablet (50 mg total) by mouth 2 (two) times daily., Disp: 180 tablet, Rfl: 0   mirabegron  ER (MYRBETRIQ ) 25 MG TB24 tablet, Take 1 tablet (25 mg total) by mouth daily., Disp: 30 tablet, Rfl: 5   rifampin  (RIFADIN ) 300 MG capsule, Take 1 capsule (300 mg total) by mouth 2 (two) times daily. Take 1 capsule (300 mg total) by mouth 2 (two) times daily. Take for 7 days PRN for flares, Disp: 60 capsule, Rfl: 1   rosuvastatin  (CRESTOR ) 10 MG tablet, Take 1 tablet (10 mg total) by mouth daily., Disp: 90 tablet, Rfl: 1   Suvorexant  (BELSOMRA ) 15 MG TABS, Take 1 tablet (15 mg total) by mouth at bedtime as needed., Disp: 90 tablet, Rfl: 0   Vitamin D , Ergocalciferol , (DRISDOL ) 1.25 MG (50000 UNIT) CAPS capsule, 1 po q wed, and 1 po q sun, Disp: 24 capsule, Rfl: 0   Objective:     There were no vitals filed for this visit.    There is no height or weight on file to calculate BMI.    Physical Exam:    ***   Electronically signed by:  Odis Mace D[facility] 7:37  AM 04/25/24

## 2024-04-26 ENCOUNTER — Ambulatory Visit: Admitting: Sports Medicine

## 2024-04-26 ENCOUNTER — Telehealth: Payer: Self-pay | Admitting: Internal Medicine

## 2024-04-26 VITALS — BP 124/72 | HR 50 | Ht 62.0 in | Wt 237.0 lb

## 2024-04-26 DIAGNOSIS — M79651 Pain in right thigh: Secondary | ICD-10-CM

## 2024-04-26 DIAGNOSIS — S76311A Strain of muscle, fascia and tendon of the posterior muscle group at thigh level, right thigh, initial encounter: Secondary | ICD-10-CM

## 2024-04-26 MED ORDER — MELOXICAM 15 MG PO TABS: 15.0000 mg | ORAL_TABLET | Freq: Every day | ORAL | 0 refills | Status: AC

## 2024-04-26 NOTE — Telephone Encounter (Signed)
 Patient saw Dr.Sagardia on 04/19/24 and was told she was supposed to get an MRI. She saw Sports Medicine downstairs and was told the order was ordered and completed on the 17th even though she has not gotten the MRI. She would like to know if it can be reordered and would like a call back to let her know it has been done. Best callback is [phone].

## 2024-04-26 NOTE — Patient Instructions (Signed)
-   Start meloxicam  15 mg daily x2 weeks.  If still having pain after 2 weeks, complete 3rd-week of NSAID. May use remaining NSAID as needed once daily for pain control.  Do not to use additional over-the-counter NSAIDs (ibuprofen , naproxen , Advil , Aleve , etc.) while taking prescription NSAIDs.  May use Tylenol  [phone] mg 2 to 3 times a day for breakthrough pain. Hamstring HEP  Heating pad over hamstring Recommend reaching out to PCP and asking about US  4 week follow up

## 2024-05-02 ENCOUNTER — Ambulatory Visit (INDEPENDENT_AMBULATORY_CARE_PROVIDER_SITE_OTHER): Admitting: Family Medicine

## 2024-05-02 ENCOUNTER — Encounter (INDEPENDENT_AMBULATORY_CARE_PROVIDER_SITE_OTHER): Payer: Self-pay | Admitting: Family Medicine

## 2024-05-02 VITALS — BP 133/80 | HR 81 | Temp 98.2°F | Ht 62.0 in | Wt 233.0 lb

## 2024-05-02 DIAGNOSIS — K5909 Other constipation: Secondary | ICD-10-CM | POA: Diagnosis not present

## 2024-05-02 DIAGNOSIS — D508 Other iron deficiency anemias: Secondary | ICD-10-CM

## 2024-05-02 DIAGNOSIS — R7303 Prediabetes: Secondary | ICD-10-CM

## 2024-05-02 DIAGNOSIS — R0602 Shortness of breath: Secondary | ICD-10-CM

## 2024-05-02 DIAGNOSIS — E559 Vitamin D deficiency, unspecified: Secondary | ICD-10-CM | POA: Diagnosis not present

## 2024-05-02 DIAGNOSIS — Z6841 Body Mass Index (BMI) 40.0 and over, adult: Secondary | ICD-10-CM

## 2024-05-02 DIAGNOSIS — E669 Obesity, unspecified: Secondary | ICD-10-CM

## 2024-05-02 MED ORDER — BUPROPION HCL ER (SR) 150 MG PO TB12: 150.0000 mg | ORAL_TABLET | Freq: Two times a day (BID) | ORAL | 0 refills | Status: DC

## 2024-05-02 MED ORDER — LINACLOTIDE 290 MCG PO CAPS: 290.0000 ug | ORAL_CAPSULE | Freq: Every day | ORAL | 0 refills | Status: DC

## 2024-05-02 MED ORDER — VITAMIN D (ERGOCALCIFEROL) 1.25 MG (50000 UNIT) PO CAPS: ORAL_CAPSULE | ORAL | 0 refills | Status: DC

## 2024-05-02 MED ORDER — METFORMIN HCL 500 MG PO TABS: 500.0000 mg | ORAL_TABLET | Freq: Two times a day (BID) | ORAL | 0 refills | Status: DC

## 2024-05-02 NOTE — Progress Notes (Incomplete)
 Molly Bean, D.O.  AB[facility], AB[facility] Specializing in Clinical Bariatric Medicine  Office located at: [address] [place], [place]  [address]   Assessment and Plan:   Orders Placed This Encounter  Procedures   QuantiFERON-TB Gold Plus   Comprehensive metabolic panel with GFR   CBC with Differential/Platelet    Medications Discontinued During This Encounter  Medication Reason   metFORMIN  (GLUCOPHAGE ) 500 MG tablet Reorder   Vitamin D , Ergocalciferol , (DRISDOL ) 1.25 MG (50000 UNIT) CAPS capsule Reorder   linaclotide  (LINZESS ) 290 MCG CAPS capsule Reorder   buPROPion  (WELLBUTRIN  SR) 150 MG 12 hr tablet Reorder     Meds ordered this encounter  Medications   Vitamin D , Ergocalciferol , (DRISDOL ) 1.25 MG (50000 UNIT) CAPS capsule    Sig: 1 po q wed, and 1 po q sun    Dispense:  24 capsule    Refill:  0    Ov for rf   metFORMIN  (GLUCOPHAGE ) 500 MG tablet    Sig: Take 1 tablet (500 mg total) by mouth 2 (two) times daily with a meal.    Dispense:  180 tablet    Refill:  0    Ov for rf   buPROPion  (WELLBUTRIN  SR) 150 MG 12 hr tablet    Sig: Take 1 tablet (150 mg total) by mouth 2 (two) times daily.    Dispense:  180 tablet    Refill:  0   linaclotide  (LINZESS ) 290 MCG CAPS capsule    Sig: Take 1 capsule (290 mcg total) by mouth daily before breakfast.    Dispense:  90 capsule    Refill:  0      FOR THE DISEASE OF OBESITY: Obesity, Beginning BMI 43.90 BMI 40.0-44.9, adult (HCC)- current bmi 42.69 Assessment & Plan: Since last office visit on 02/13/24 patient's muscle mass has increased by 1.6 lbs. Fat mass has decreased by 3.2 lbs. Total body water has decreased by 3.4 lbs.  Body fat % has decreased by 1.1%. Counseling done on how various foods will affect these numbers and how to maximize success  Total lbs lost to date: 7 lbs Total weight loss percentage to date: -2.92%   Recommended Dietary Goals Molly Bean currently in the action stage of change. As such, her  goal Bean to continue weight management plan.  She has agreed to: continue current plan   Behavioral Intervention We discussed the following today: increasing lean protein intake to established goals  Additional resources provided today: Handout on Common Characteristics of Successful Weight Losers and Maintainers   Evidence-based interventions for health behavior change were utilized today including the discussion of self monitoring techniques, problem-solving barriers and SMART goal setting techniques.   Regarding patient's less desirable eating habits and patterns, we employed the technique of small changes.   Pt will specifically work on: Starting to journaling     Recommended Physical Activity Goals Molly Bean has been advised to work up to 300-450 minutes of moderate intensity aerobic activity a week and strengthening exercises 2-3 times per week for cardiovascular health, weight loss maintenance and preservation of muscle mass.   She has agreed to: Continue current level of physical activity    Pharmacotherapy We both agreed to: {EMagreedrx:29170}   ASSOCIATED CONDITIONS ADDRESSED TODAY:  Pre-diabetes Assessment & Plan: Lab Results  Component Value Date   HGBA1C 6.0 (H) 09/05/2023   HGBA1C 5.8 (H) 03/31/2023   HGBA1C 6.1 (H) 11/22/2022   INSULIN  13.1 09/05/2023   INSULIN  28.8 (H) 03/31/2023  INSULIN  9.1 03/30/2022    Currently on Metformin  500 mg BID. Compliant and tolerating well with no SE.     - Continue Metformin , refilling today, no changes -   ***  Other constipation Assessment & Plan: Compliant with Linzess  and tolerating well. No acute changes or concerns with bowel habits.    - Continue Linzess   - Counseling - walking, increase water intake  ***  Other iron  deficiency anemia Assessment & Plan: Lab Results  Component Value Date   IRON  62 12/30/2022   TIBC 365.4 12/30/2022   FERRITIN 38.7 12/30/2022   Taking OTC iron  supplementation  ***  - Continue with iron  supplementation - Check CBC  ***  Vitamin D  deficiency Assessment & Plan: Lab Results  Component Value Date   VD25OH 33.1 09/05/2023   VD25OH 63.6 03/31/2023   VD25OH 43.7 11/22/2022   Currently on ERGO 50K units twice weekly, Wed/Sun  Continue with supplementation   ***   SOBOE (shortness of breath on exertion) Assessment & Plan: Condition Bean Improving, but not optimized.Molly Bean does feel that she easily gets out of breath when walking up two flights of stairs and this Bean concerning to the her.  It appears Molly Bean's shortness of breath continues to be obesity related and exercise induced.  she does not appear to have any red flag symptoms/ concerns today.  No chest pain, heart palpitations or dizziness.   Also, this condition appears to be improving from prior as she enhances her cardiovascular health and fitness.  Indirect Calorimeter performed today in office and interpreted by myself- see details below.  This information Bean a critical component in devising her nutritional plan today.  Counseling was done with patient on these findings and on various ways to make these results more favorable for weight loss.  Indirect Calorimeter completed today to help guide our dietary regimen. It shows a VO2 of 229 and a REE of 1584.  Her calculated basal metabolic rate Bean 8322, thus her measured basal metabolic rate Bean worse than expected.  Patient agreed to continue to work on weight loss at this time via a prudent nutritional plan and regular exercise.  As Molly Bean progresses, we will increase exercise intensity and frequency as tolerated to treat her current condition.   If Molly Bean follows our recommendations and loses 5-10% of their weight without improvement of her shortness of breath or if at any time, symptoms become more concerning, they agree to urgently follow up with their PCP/ specialist for further consideration/ evaluation.   Molly Bean  verbalizes agreement with this plan.     *** Muscle mass has improved, but since Feb it has improved.  If muscle mass goes up, her metabolism improves.   When she first started the plan 04/15/2020  her RMR was 1202, wt was 240 lbs, and BF% was 51.9%  Last checked 10/2022 and at that time RMR was 1382, wt was 212 lbs, BF% w 48%   IC done today, RMR 1584, wt Bean 233 lbs, and BF% 49.3%.   ***    Follow up:   Return in about 2 weeks (around 05/16/2024) for 2-3 week f/u.  She was informed of the importance of frequent follow up visits to maximize her success with intensive lifestyle modifications for her multiple health conditions.  Subjective:   Chief complaint: Obesity Molly Bean Bean here to discuss her progress with her obesity treatment plan. She Bean on the Category 1 Plan and states she Bean following her eating plan approximately 80%  of the time. She states she Bean walking/working out 30-45 minutes 6 days per week.   Interval History:  Joyanne Bean Bean here for a follow up office visit. Since last OV on 02/13/24, she Bean down 1 lb.    Salads with grilled chicken Not measuring  May not be eating enough each and not enough protein  Occasionally skips meals ***    Pharmacotherapy that aid with weight loss: She Bean currently taking Wellbutrin  SR 150 mg BID and Metformin  500 mg BID,    Review of Systems:  Pertinent positives were addressed with patient today.  Reviewed by clinician on day of visit: allergies, medications, problem list, medical history, surgical history, family history, social history, and previous encounter notes.  Weight Summary and Biometrics   Weight Lost Since Last Visit: 1lb  Weight Gained Since Last Visit: 0lb  ***  Vitals Temp: 98.2 F (36.8 C) BP: 133/80 Pulse Rate: 81 SpO2: 97 %   Anthropometric Measurements Height: 5' 2 (1.575 m) Weight: 233 lb (105.7 kg) BMI (Calculated): 42.61 Weight at Last Visit: 234lb Weight Lost Since Last Visit:  1lb Weight Gained Since Last Visit: 0lb Starting Weight: 240lb Total Weight Loss (lbs): 7 lb (3.175 kg)   Body Composition  Body Fat %: 49.3 % Fat Mass (lbs): 115 lbs Muscle Mass (lbs): 112.2 lbs Total Body Water (lbs): 78 lbs Visceral Fat Rating : 17   Other Clinical Data RMR: 1584 Fasting: Yes Labs: Yes Today's Visit #: 48 Starting Date: 04/14/20 Comments: Cat 1    Objective:   PHYSICAL EXAM: Blood pressure 133/80, pulse 81, temperature 98.2 F (36.8 C), height 5' 2 (1.575 m), weight 233 lb (105.7 kg), SpO2 97%. Body mass index Bean 42.62 kg/m.  General: she Bean overweight, cooperative and in no acute distress. PSYCH: Has normal mood, affect and thought process.   HEENT: EOMI, sclerae are anicteric. Lungs: Normal breathing effort, no conversational dyspnea. Extremities: Moves * 4 Neurologic: A and O * 3, good insight  DIAGNOSTIC DATA REVIEWED: BMET    Component Value Date/Time   NA 142 09/05/2023 1159   K 4.4 09/05/2023 1159   CL 104 09/05/2023 1159   CO2 20 09/05/2023 1159   GLUCOSE 92 09/05/2023 1159   GLUCOSE 95 11/12/2019 0930   BUN 12 09/05/2023 1159   CREATININE 0.97 09/05/2023 1159   CALCIUM  9.5 09/05/2023 1159   GFRNONAA 91 04/15/2020 1654   GFRAA 105 04/15/2020 1654   Lab Results  Component Value Date   HGBA1C 6.0 (H) 09/05/2023   HGBA1C 6.1 (H) 04/15/2020   Lab Results  Component Value Date   INSULIN  13.1 09/05/2023   INSULIN  21.9 04/15/2020   Lab Results  Component Value Date   TSH 0.95 12/30/2022   CBC    Component Value Date/Time   WBC 7.2 12/30/2022 1429   RBC 5.91 (H) 12/30/2022 1429   HGB 12.7 12/30/2022 1429   HGB 12.5 03/30/2022 1049   HCT 39.6 12/30/2022 1429   HCT 39.5 03/30/2022 1049   PLT 190.0 12/30/2022 1429   PLT 236 03/30/2022 1049   MCV 67.1 Repeated and verified X2. (L) 12/30/2022 1429   MCV 68 (L) 03/30/2022 1049   MCH 21.6 (L) 03/30/2022 1049   MCH 21.7 (L) 09/25/2016 0012   MCHC 32.0 12/30/2022 1429    RDW 17.7 (H) 12/30/2022 1429   RDW 19.0 (H) 03/30/2022 1049   Iron  Studies    Component Value Date/Time   IRON  62 12/30/2022 1429   IRON   63 11/03/2021 0942   TIBC 365.4 12/30/2022 1429   TIBC 332 11/03/2021 0942   FERRITIN 38.7 12/30/2022 1429   FERRITIN 72 11/03/2021 0942   IRONPCTSAT 17.0 (L) 12/30/2022 1429   IRONPCTSAT 19 11/03/2021 0942   Lipid Panel     Component Value Date/Time   CHOL 204 (H) 09/05/2023 1159   TRIG 80 09/05/2023 1159   HDL 80 09/05/2023 1159   CHOLHDL 2.6 09/05/2023 1159   CHOLHDL 3 11/06/2010 1638   VLDL 20.8 11/06/2010 1638   LDLCALC 110 (H) 09/05/2023 1159   LDLDIRECT 96 11/22/2022 1446   Hepatic Function Panel     Component Value Date/Time   PROT 6.0 11/22/2022 1446   ALBUMIN 3.7 (L) 11/22/2022 1446   AST 14 11/22/2022 1446   ALT 17 11/22/2022 1446   ALKPHOS 76 11/22/2022 1446   BILITOT <0.2 11/22/2022 1446   BILIDIR 0.0 01/30/2013 1213      Component Value Date/Time   TSH 0.95 12/30/2022 1429   Nutritional Lab Results  Component Value Date   VD25OH 33.1 09/05/2023   VD25OH 63.6 03/31/2023   VD25OH 43.7 11/22/2022    Attestations:   I, Vernell Forest, acting as a Stage manager for Molly Jenkins, DO., have compiled all relevant documentation for today's office visit on behalf of Molly Jenkins, DO, while in the presence of [employer], DO.  Reviewed by clinician on day of visit: allergies, medications, problem list, medical history, surgical history, family history, social history, and previous encounter notes pertinent to patient's obesity diagnosis.  I have spent 40 minutes in the care of the patient today including 30 minutes face-to-face assessing and reviewing listed medical problems above as outlined in office visit note and providing nutritional and behavioral counseling as outlined in obesity care plan.   I have reviewed the above documentation for accuracy and completeness, and I agree with the above. Molly JINNY Bean, D.O.  The 21st Century Cures Act was signed into law in 2016 which includes the topic of electronic health records.  This provides immediate access to information in MyChart.  This includes consultation notes, operative notes, office notes, lab results and pathology reports.  If you have any questions about what you read please let us  know at your next visit so we can discuss your concerns and take corrective action if need be.  We are right here with you.

## 2024-05-02 NOTE — Telephone Encounter (Unsigned)
 Copied from CRM [id number]. Topic: Clinical - Request for Lab/Test Order >> May 02, 2024  2:33 PM Deleta RAMAN wrote: Reason for CRM: patient was supposed to have a MRI completed on leg on 7/17. She has not received any MRI/XRAY. Felicia was supposed to follow up with patient. She has not received a call or update in regards to this. Patient would like a call back at [phone]

## 2024-05-03 ENCOUNTER — Other Ambulatory Visit: Payer: Self-pay

## 2024-05-03 DIAGNOSIS — M79651 Pain in right thigh: Secondary | ICD-10-CM

## 2024-05-03 NOTE — Telephone Encounter (Signed)
 Patient states that she was supposed to have a DVT ultrsound done. She states that she didn't have it done. But in the system it says that this is incomplete. Could you look at the order for me and see if this is a mistake in her chart ?

## 2024-05-03 NOTE — Telephone Encounter (Signed)
 On 04/19/2024 an order for ultrasound of right leg to rule out DVT was placed

## 2024-05-03 NOTE — Telephone Encounter (Signed)
 Not sure what else I can tell you.  Go downstairs and find out more.

## 2024-05-03 NOTE — Telephone Encounter (Signed)
 Order has been placed.

## 2024-05-03 NOTE — Telephone Encounter (Signed)
 Yes sir you did. But in the system it's saying she got it done but the patient is stating that she didn't. Is that something I need to fix with you or go downstairs?

## 2024-05-03 NOTE — Telephone Encounter (Signed)
 Can you place it for me please?

## 2024-05-03 NOTE — Telephone Encounter (Signed)
 Can you place a new DVT order?

## 2024-05-04 ENCOUNTER — Ambulatory Visit: Payer: Self-pay | Admitting: Emergency Medicine

## 2024-05-04 ENCOUNTER — Ambulatory Visit (HOSPITAL_COMMUNITY)
Admission: RE | Admit: 2024-05-04 | Discharge: 2024-05-04 | Disposition: A | Discharge status: Home / Self Care | Source: Ambulatory Visit | Attending: Emergency Medicine | Admitting: Emergency Medicine

## 2024-05-04 DIAGNOSIS — M79651 Pain in right thigh: Secondary | ICD-10-CM | POA: Insufficient documentation

## 2024-05-06 LAB — COMPREHENSIVE METABOLIC PANEL WITH GFR
ALT: 12 IU/L (ref 0–32)
AST: 15 IU/L (ref 0–40)
Albumin: 4.5 g/dL (ref 3.9–4.9)
Alkaline Phosphatase: 93 IU/L (ref 44–121)
BUN/Creatinine Ratio: 16 (ref 12–28)
BUN: 17 mg/dL (ref 8–27)
Bilirubin Total: 0.3 mg/dL (ref 0.0–1.2)
CO2: 23 mmol/L (ref 20–29)
Calcium: 10 mg/dL (ref 8.7–10.3)
Chloride: 100 mmol/L (ref 96–106)
Creatinine, Ser: 1.04 mg/dL — ABNORMAL HIGH (ref 0.57–1.00)
Globulin, Total: 2.3 g/dL (ref 1.5–4.5)
Glucose: 81 mg/dL (ref 70–99)
Potassium: 4.2 mmol/L (ref 3.5–5.2)
Sodium: 140 mmol/L (ref 134–144)
Total Protein: 6.8 g/dL (ref 6.0–8.5)
eGFR: 60 mL/min/1.73 (ref >59)

## 2024-05-06 LAB — CBC WITH DIFFERENTIAL/PLATELET
Basophils Absolute: 0 x10E3/uL (ref 0.0–0.2)
Basos: 0 %
EOS (ABSOLUTE): 0.1 x10E3/uL (ref 0.0–0.4)
Eos: 1 %
Hematocrit: 42.5 % (ref 34.0–46.6)
Hemoglobin: 12.7 g/dL (ref 11.1–15.9)
Immature Grans (Abs): 0 x10E3/uL (ref 0.0–0.1)
Immature Granulocytes: 0 %
Lymphocytes Absolute: 1.8 x10E3/uL (ref 0.7–3.1)
Lymphs: 25 %
MCH: 21.1 pg — ABNORMAL LOW (ref 26.6–33.0)
MCHC: 29.9 g/dL — ABNORMAL LOW (ref 31.5–35.7)
MCV: 71 fL — ABNORMAL LOW (ref 79–97)
Monocytes Absolute: 0.4 x10E3/uL (ref 0.1–0.9)
Monocytes: 6 %
Neutrophils Absolute: 4.6 x10E3/uL (ref 1.4–7.0)
Neutrophils: 68 %
Platelets: 194 x10E3/uL (ref 150–450)
RBC: 6.02 x10E6/uL — ABNORMAL HIGH (ref 3.77–5.28)
RDW: 18.4 % — ABNORMAL HIGH (ref 11.7–15.4)
WBC: 6.9 x10E3/uL (ref 3.4–10.8)

## 2024-05-06 LAB — LIPID PANEL
Chol/HDL Ratio: 2.1 ratio (ref 0.0–4.4)
Cholesterol, Total: 144 mg/dL (ref 100–199)
HDL: 70 mg/dL (ref >39)
LDL Chol Calc (NIH): 61 mg/dL (ref 0–99)
Triglycerides: 61 mg/dL (ref 0–149)
VLDL Cholesterol Cal: 13 mg/dL (ref 5–40)

## 2024-05-06 LAB — QUANTIFERON-TB GOLD PLUS
QuantiFERON Mitogen Value: >10 [IU]/mL
QuantiFERON Nil Value: 0.03 [IU]/mL
QuantiFERON TB1 Ag Value: 0.02 [IU]/mL
QuantiFERON TB2 Ag Value: 0.02 [IU]/mL
QuantiFERON-TB Gold Plus: NEGATIVE

## 2024-05-06 LAB — VITAMIN B12: Vitamin B-12: 557 pg/mL (ref 232–1245)

## 2024-05-06 LAB — HEMOGLOBIN A1C
Est. average glucose Bld gHb Est-mCnc: 128 mg/dL
Hgb A1c MFr Bld: 6.1 % — ABNORMAL HIGH (ref 4.8–5.6)

## 2024-05-06 LAB — VITAMIN D 25 HYDROXY (VIT D DEFICIENCY, FRACTURES): Vit D, 25-Hydroxy: 71.3 ng/mL (ref 30.0–100.0)

## 2024-05-06 LAB — MAGNESIUM: Magnesium: 2 mg/dL (ref 1.6–2.3)

## 2024-05-06 LAB — INSULIN, RANDOM: INSULIN: 24.6 u[IU]/mL (ref 2.6–24.9)

## 2024-05-20 NOTE — Progress Notes (Signed)
 Molly Bean, D.O.  ABFM, ABOM Specializing in Clinical Bariatric Medicine  Office located at: [address] [place], [place]  [address]   Assessment and Plan:   Orders Placed This Encounter  Procedures   QuantiFERON-TB Gold Plus   Comprehensive metabolic panel with GFR   CBC with Differential/Platelet   Medications Discontinued During This Encounter  Medication Reason   metFORMIN  (GLUCOPHAGE ) 500 MG tablet Reorder   Vitamin D , Ergocalciferol , (DRISDOL ) 1.25 MG (50000 UNIT) CAPS capsule Reorder   linaclotide  (LINZESS ) 290 MCG CAPS capsule Reorder   buPROPion  (WELLBUTRIN  SR) 150 MG 12 hr tablet Reorder    Meds ordered this encounter  Medications   Vitamin D , Ergocalciferol , (DRISDOL ) 1.25 MG (50000 UNIT) CAPS capsule    Sig: 1 po q wed, and 1 po q sun    Dispense:  24 capsule    Refill:  0    Ov for rf   metFORMIN  (GLUCOPHAGE ) 500 MG tablet    Sig: Take 1 tablet (500 mg total) by mouth 2 (two) times daily with a meal.    Dispense:  180 tablet    Refill:  0    Ov for rf   buPROPion  (WELLBUTRIN  SR) 150 MG 12 hr tablet    Sig: Take 1 tablet (150 mg total) by mouth 2 (two) times daily.    Dispense:  180 tablet    Refill:  0   linaclotide  (LINZESS ) 290 MCG CAPS capsule    Sig: Take 1 capsule (290 mcg total) by mouth daily before breakfast.    Dispense:  90 capsule    Refill:  0     Labs obtained today will be reviewed at next OV.    FOR THE DISEASE OF OBESITY: Obesity, Beginning BMI 43.90 BMI 40.0-44.9, adult (HCC)- current bmi 42.69 Assessment & Plan: Since last office visit on 02/13/24 patient's muscle mass has increased by 1.6 lbs. Fat mass has decreased by 3.2 lbs. Total body water has decreased by 3.4 lbs.  Body fat % has decreased by 1.1%. Counseling done on how various foods will affect these numbers and how to maximize success  Total lbs lost to date: 7 lbs Total weight loss percentage to date: -2.92%   Recommended Dietary Goals Molly Bean is  currently in the action stage of change. As such, her goal is to continue weight management plan.  She has agreed to: continue current plan --> CAT 1 MP  Also recommended she try journaling 1050-1150 calories per day with 80+ g of protein per day.  Pt is hesitant to start journaling, but states she will give it a try.   Behavioral Intervention We discussed the following today: increasing lean protein intake to established goals, decreasing simple carbohydrates , avoiding skipping meals, increasing water intake , and work on tracking and journaling calories using tracking application. Extensively discussed the benefits of journaling in helping her become more mindful when eating, work on eating all her food at every meal and prioritizing lean proteins, and trying to make healthier food choices when eating home cooked meals. Reviewed how all these lifestyle modifications can help her lose weight, improve her chronic conditions, and physical health overall.   Additional resources provided today: Handout on Common Characteristics of Successful Weight Losers and Maintainers   Evidence-based interventions for health behavior change were utilized today including the discussion of self monitoring techniques, problem-solving barriers and SMART goal setting techniques.   Regarding patient's less desirable eating habits and patterns, we employed the  technique of small changes.   Pt will specifically work on: Starting to journaling     Recommended Physical Activity Goals Molly Bean has been advised to work up to 300-450 minutes of moderate intensity aerobic activity a week and strengthening exercises 2-3 times per week for cardiovascular health, weight loss maintenance and preservation of muscle mass.   She has agreed to: Continue current level of physical activity    Pharmacotherapy We both agreed to: Continue with current nutritional and behavioral strategies and continue meds that aid in wt loss  (Wellbutrin  and Metformin )    ASSOCIATED CONDITIONS ADDRESSED TODAY:  Pre-diabetes Assessment & Plan: Lab Results  Component Value Date   HGBA1C 6.1 (H) 05/02/2024   HGBA1C 6.0 (H) 09/05/2023   HGBA1C 5.8 (H) 03/31/2023   INSULIN  24.6 05/02/2024   INSULIN  13.1 09/05/2023   INSULIN  28.8 (H) 03/31/2023    Currently on Metformin  500 mg BID. Compliant and tolerating well with no SE. Pt skipping meals. Not meeting her protein intake goals as recommended per her meal plan.  Was previously on Metformin , stating her insurances stopped coverage.   Work on increasing protein intake and avoid skipping meals. Reviewed how protein will help glycemic control. Discussed how skipping meals can effect blood sugars and make weight loss more challenging. Continue with current med regimen as prescribed. Will refill Metforin today, no changes made. Labs obtained today will be reviewed at her next OV.    Other constipation Assessment & Plan: Compliant with Linzess  and tolerating well. No acute changes or concerns with bowel habits. Reviewed how properly hydrating and exercise promotes bowel movements. Drink at least 1/2 her body weight in ounces of water per day. Continue with Linzess  as prescribed. Will refill Linzess  today, no changes made.     Other iron  deficiency anemia Assessment & Plan: Lab Results  Component Value Date   IRON  62 12/30/2022   TIBC 365.4 12/30/2022   FERRITIN 38.7 12/30/2022   Taking OTC iron  supplementation as advised. Tolerating well with no SE reported. No acute concerns. Pt can also increase intake of iron -rich foods. Continue with iron  supplementation. Will recheck CBC today and review results at her next OV. Pt requested     Vitamin D  deficiency Assessment & Plan: Lab Results  Component Value Date   VD25OH 71.3 05/02/2024   VD25OH 33.1 09/05/2023   VD25OH 63.6 03/31/2023   Currently on ERGO 50K units twice weekly, Wed/Sun. Good compliance/tolerance. No SE or  acute concerns reported. Continue with supplementation, will refill ERGO today with no changes. Labs obtained today will be reviewed at her next OV.    Obesity, Beginning BMI 43.90 BMI 40.0-44.9, adult (HCC)- current bmi 42.69 SOBOE (shortness of breath on exertion) Assessment & Plan: Condition is Improving, but not optimized.SABRA Barges does feel that she easily gets out of breath when walking up two flights of stairs and this is concerning to the her.  It appears Molly Bean's shortness of breath continues to be obesity related and exercise induced.  she does not appear to have any red flag symptoms/ concerns today.  No chest pain, heart palpitations or dizziness.   Also, this condition appears to be improving from prior as she enhances her cardiovascular health and fitness.  Indirect Calorimeter performed today in office and interpreted by myself- see details below.  This information is a critical component in devising her nutritional plan today.  Counseling was done with patient on these findings and on various ways to make these results more favorable  for weight loss.  Indirect Calorimeter completed today to help guide our dietary regimen. It shows a VO2 of 229 and a REE of 1584.  Her calculated basal metabolic rate is 8322, thus her measured basal metabolic rate is worse than expected.  Patient agreed to continue to work on weight loss at this time via a prudent nutritional plan and regular exercise.  As Molly Bean progresses, we will increase exercise intensity and frequency as tolerated to treat her current condition.   If Molly Bean follows our recommendations and loses 5-10% of their weight without improvement of her shortness of breath or if at any time, symptoms become more concerning, they agree to urgently follow up with their PCP/ specialist for further consideration/ evaluation.   Molly Bean verbalizes agreement with this plan.     Her muscle mass has improved since 10/2023. When she  first started the program on 04/15/2020: RMR 1202, weight of 240 lbs, and body fat % of 51.9%. On 10/2022 these were rechecked: RMR 1382, weight of 212 lbs, and body fat % of 48%. IC done today showed: RMR 1584, weight of 233 lbs, and body fat % of 49.3%.   Encouraged pt to continue working on increasing her protein intake and decreasing her simple carb intake. Reviewed how her metabolism will continue to improve as she continue to gain muscle mass and lose weight.     Follow up:   Return in about 2 weeks (around 05/16/2024) for 2-3 week f/u.  She was informed of the importance of frequent follow up visits to maximize her success with intensive lifestyle modifications for her multiple health conditions.  Subjective:   Chief complaint: Obesity Molly Bean is here to discuss her progress with her obesity treatment plan. She is on the Category 1 Plan and states she is following her eating plan approximately 80% of the time. She states she is walking/working out 30-45 minutes 6 days per week.   Interval History:  Molly Bean is here for a follow up office visit. Since last OV on 02/13/24, she is down 1 lb. She reports she may not be eating enough food and is not eating the recommended amount of protein at each meal. She has been eating a lot of salads with grilled chicken. She is not measuring or weighing her foods. Occasionally skips meals.   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 She also plans to get a part-time job at [employer] at a school. She reports getting tired when walking. States her husband has told her her pace is slowing down and her energy levels have decreased.   Pharmacotherapy that aid with weight loss: She is currently taking Wellbutrin  SR 150 mg BID and Metformin  500 mg BID,    Review of Systems:  Pertinent positives were addressed with patient today.  Reviewed by clinician on day of visit: allergies, medications, problem list, medical  history, surgical history, family history, social history, and previous encounter notes.  Weight Summary and Biometrics   Weight Lost Since Last Visit: 1lb   Weight Gained Since Last Visit: 0lb    Vitals Temp: 98.2 F (36.8 C) BP: 133/80 Pulse Rate: 81 SpO2: 97 %     Anthropometric Measurements Height: 5' 2 (1.575 m) Weight: 233 lb (105.7 kg) BMI (Calculated): 42.61 Weight at Last Visit: 234lb Weight Lost Since Last Visit: 1lb Weight Gained Since Last Visit: 0lb Starting Weight: 240lb Total Weight Loss (lbs): 7 lb (3.175 kg)     Body  Composition  Body Fat %: 49.3 % Fat Mass (lbs): 115 lbs Muscle Mass (lbs): 112.2 lbs Total Body Water (lbs): 78 lbs Visceral Fat Rating : 17     Other Clinical Data RMR: 1584 Fasting: Yes Labs: Yes Today's Visit #: 48 Starting Date: 04/14/20 Comments: Cat 1  Objective:   PHYSICAL EXAM: Blood pressure 133/80, pulse 81, temperature 98.2 F (36.8 C), height 5' 2 (1.575 m), weight 233 lb (105.7 kg), SpO2 97%. Body mass index is 42.62 kg/m.  General: she is overweight, cooperative and in no acute distress. PSYCH: Has normal mood, affect and thought process.   HEENT: EOMI, sclerae are anicteric. Lungs: Normal breathing effort, no conversational dyspnea. Extremities: Moves * 4 Neurologic: A and O * 3, good insight  DIAGNOSTIC DATA REVIEWED: BMET    Component Value Date/Time   NA 140 05/02/2024 1057   K 4.2 05/02/2024 1057   CL 100 05/02/2024 1057   CO2 23 05/02/2024 1057   GLUCOSE 81 05/02/2024 1057   GLUCOSE 95 11/12/2019 0930   BUN 17 05/02/2024 1057   CREATININE 1.04 (H) 05/02/2024 1057   CALCIUM  10.0 05/02/2024 1057   GFRNONAA 91 04/15/2020 1654   GFRAA 105 04/15/2020 1654   Lab Results  Component Value Date   HGBA1C 6.1 (H) 05/02/2024   HGBA1C 6.1 (H) 04/15/2020   Lab Results  Component Value Date   INSULIN  24.6 05/02/2024   INSULIN  21.9 04/15/2020   Lab Results  Component Value Date   TSH 0.95  12/30/2022   CBC    Component Value Date/Time   WBC 6.9 05/02/2024 1057   WBC 7.2 12/30/2022 1429   RBC 6.02 (H) 05/02/2024 1057   RBC 5.91 (H) 12/30/2022 1429   HGB 12.7 05/02/2024 1057   HCT 42.5 05/02/2024 1057   PLT 194 05/02/2024 1057   MCV 71 (L) 05/02/2024 1057   MCH 21.1 (L) 05/02/2024 1057   MCH 21.7 (L) 09/25/2016 0012   MCHC 29.9 (L) 05/02/2024 1057   MCHC 32.0 12/30/2022 1429   RDW 18.4 (H) 05/02/2024 1057   Iron  Studies    Component Value Date/Time   IRON  62 12/30/2022 1429   IRON  63 11/03/2021 0942   TIBC 365.4 12/30/2022 1429   TIBC 332 11/03/2021 0942   FERRITIN 38.7 12/30/2022 1429   FERRITIN 72 11/03/2021 0942   IRONPCTSAT 17.0 (L) 12/30/2022 1429   IRONPCTSAT 19 11/03/2021 0942   Lipid Panel     Component Value Date/Time   CHOL 144 05/02/2024 1057   TRIG 61 05/02/2024 1057   HDL 70 05/02/2024 1057   CHOLHDL 2.1 05/02/2024 1057   CHOLHDL 3 11/06/2010 1638   VLDL 20.8 11/06/2010 1638   LDLCALC 61 05/02/2024 1057   LDLDIRECT 96 11/22/2022 1446   Hepatic Function Panel     Component Value Date/Time   PROT 6.8 05/02/2024 1057   ALBUMIN 4.5 05/02/2024 1057   AST 15 05/02/2024 1057   ALT 12 05/02/2024 1057   ALKPHOS 93 05/02/2024 1057   BILITOT 0.3 05/02/2024 1057   BILIDIR 0.0 01/30/2013 1213      Component Value Date/Time   TSH 0.95 12/30/2022 1429   Nutritional Lab Results  Component Value Date   VD25OH 71.3 05/02/2024   VD25OH 33.1 09/05/2023   VD25OH 63.6 03/31/2023    Attestations:   I, Vernell Forest, acting as a Stage manager for Molly Jenkins, DO., have compiled all relevant documentation for today's office visit on behalf of Molly Jenkins, DO, while in the  presence of [employer], DO.  Reviewed by clinician on day of visit: allergies, medications, problem list, medical history, surgical history, family history, social history, and previous encounter notes pertinent to patient's obesity diagnosis.  I have spent 40  minutes in the care of the patient today including 30 minutes face-to-face assessing and reviewing listed medical problems above as outlined in office visit note and providing nutritional and behavioral counseling as outlined in obesity care plan.   I have reviewed the above documentation for accuracy and completeness, and I agree with the above. Molly JINNY Bean, D.O.  The 21st Century Cures Act was signed into law in 2016 which includes the topic of electronic health records.  This provides immediate access to information in MyChart.  This includes consultation notes, operative notes, office notes, lab results and pathology reports.  If you have any questions about what you read please let us  know at your next visit so we can discuss your concerns and take corrective action if need be.  We are right here with you.

## 2024-05-23 ENCOUNTER — Ambulatory Visit (INDEPENDENT_AMBULATORY_CARE_PROVIDER_SITE_OTHER): Admitting: Family Medicine

## 2024-05-23 ENCOUNTER — Encounter (INDEPENDENT_AMBULATORY_CARE_PROVIDER_SITE_OTHER): Payer: Self-pay | Admitting: Family Medicine

## 2024-05-23 ENCOUNTER — Telehealth (INDEPENDENT_AMBULATORY_CARE_PROVIDER_SITE_OTHER): Payer: Self-pay | Admitting: Family Medicine

## 2024-05-23 VITALS — BP 102/69 | HR 92 | Temp 98.7°F | Ht 62.0 in | Wt 234.0 lb

## 2024-05-23 DIAGNOSIS — K5909 Other constipation: Secondary | ICD-10-CM

## 2024-05-23 DIAGNOSIS — E669 Obesity, unspecified: Secondary | ICD-10-CM

## 2024-05-23 DIAGNOSIS — D508 Other iron deficiency anemias: Secondary | ICD-10-CM | POA: Diagnosis not present

## 2024-05-23 DIAGNOSIS — R7303 Prediabetes: Secondary | ICD-10-CM

## 2024-05-23 DIAGNOSIS — E7849 Other hyperlipidemia: Secondary | ICD-10-CM | POA: Diagnosis not present

## 2024-05-23 DIAGNOSIS — E559 Vitamin D deficiency, unspecified: Secondary | ICD-10-CM | POA: Diagnosis not present

## 2024-05-23 DIAGNOSIS — Z6841 Body Mass Index (BMI) 40.0 and over, adult: Secondary | ICD-10-CM

## 2024-05-23 NOTE — Progress Notes (Signed)
 Molly Bean, D.O.  ABFM, ABOM Specializing in Clinical Bariatric Medicine  Office located at: [address] [place], [place]  [address]     Assessment and Plan:   Medications Discontinued During This Encounter  Medication Reason   albuterol  (VENTOLIN  HFA) 108 (90 Base) MCG/ACT inhaler      FOR THE DISEASE OF OBESITY:  BMI 40.0-44.9, adult (HCC)- current bmi 42.79 Obesity, Beginning BMI 43.90 Assessment & Plan: Since last office visit on 05/02/24 patient's muscle mass has decreased by 0.8 lbs. Fat mass has increased by 2 lbs. Total body water has increased by 3.2 lbs.  Body fat % has increased by 0.6 %. Counseling done on how various foods will affect these numbers and how to maximize success  Total lbs lost to date: 6 lbs Total weight loss percentage to date: -2.50 %   Recommended Dietary Goals [name] is currently in the action stage of change. As such, her goal is to continue weight management plan.  She has agreed to: continue current plan   Behavioral Intervention We discussed the following today: increasing lean protein intake to established goals, decreasing simple carbohydrates , keeping healthy foods at home, and continue to work on maintaining a reduced calorie state, getting the recommended amount of protein, incorporating whole foods, making healthy choices, staying well hydrated and practicing mindfulness when eating. Encouraged pt to focus on journaling her daily intake to be more mindful of her eating habits and the foods she is eating.    Additional resources provided today: Handout on CAT 1 meal plan   Evidence-based interventions for health behavior change were utilized today including the discussion of self monitoring techniques, problem-solving barriers and SMART goal setting techniques.   Regarding patient's less desirable eating habits and patterns, we employed the technique of small changes.   Pt will specifically work on: start journaling  her daily intake     In regards to today's labs, we performed 1 lab as request. Pt will contact her dermatologist to interpret these results.    Recommended Physical Activity Goals [name] has been advised to work up to 300-450 minutes of moderate intensity aerobic activity a week and strengthening exercises 2-3 times per week for cardiovascular health, weight loss maintenance and preservation of muscle mass.   She has agreed to: Continue current level of physical activity as able and Increase physical activity in their day and reduce sedentary time (increase NEAT).   Pharmacotherapy We both agreed to: Continue with current nutritional and behavioral strategies and continue with meds that aid in wt loss (Wellbutrin  and Metformin ).   ASSOCIATED CONDITIONS ADDRESSED TODAY:  Vitamin D  deficiency Assessment & Plan: Lab Results  Component Value Date   VD25OH 71.3 05/02/2024   VD25OH 33.1 09/05/2023   VD25OH 63.6 03/31/2023   Reviewed labs obtained at LOV: optimal vit D levels. Currently on ERGO 50K units on Wed/Sun. Good compliance/tolerance. No acute concerns today. Vit B also at goal.   Ideal vit D range of 50-70 reviewed with pt. Counseling on vit D being a lipophilic (fat soluble) vitamin; levels expected to improve as she loses weight. Continue with current vit D supplementation.      Pre-diabetes Assessment & Plan: Lab Results  Component Value Date   HGBA1C 6.1 (H) 05/02/2024   HGBA1C 6.0 (H) 09/05/2023   HGBA1C 5.8 (H) 03/31/2023   INSULIN  24.6 05/02/2024   INSULIN  13.1 09/05/2023   INSULIN  28.8 (H) 03/31/2023    Reviewed labs obtained L[facility]: A1c increased  to 6.1. A1c has been trending up since 03/30/24. Insulin  also also worsened; from 13.1 in 122/024 to 24.6 in 04/2024. Serum creatinine elevated at 1.04. Currently on Metformin  500 mg BID with good compliance and tolerance.   Ideal A1c and insulin  reviewed w pt. Serum creatinine goal of <1 also reviewed. Continue  Metformin  at current dose. Continue working on [employer] and regular exercise as able. Increase protein intake and decrease simple carbs and added sugars. Will monitor alongside PCP.     Other constipation Assessment & Plan: Overall well controlled currently. Currently on Linzess  daily. Per labs obtained LOV, serum creatinine was elevated at 1.04, suggesting poor hydration status. No acute concerns reported today.   Reviewed benefits of walking, proper hydration, and eating fiber for constipation. Properly hydrate by drinking at least half her body weight in ounces of water per day; more when exercising. Continue med regimen as prescribed.     Other iron  deficiency anemia Assessment & Plan: Lab Results  Component Value Date   IRON  62 12/30/2022   TIBC 365.4 12/30/2022   FERRITIN 38.7 12/30/2022   Reviewed labs obtained at LOV: iron  and ferritin WNL. Taking OTC iron  supplementation as advised. Tolerating well with no SE reported. No acute concerns.   Continue following her nutritional meal plan and increasing her intake of iron -rich foods. Continue iron  supplements.     Other hyperlipidemia Assessment & Plan: Lab Results  Component Value Date   CHOL 144 05/02/2024   HDL 70 05/02/2024   LDLCALC 61 05/02/2024   LDLDIRECT 96 11/22/2022   TRIG 61 05/02/2024   CHOLHDL 2.1 05/02/2024   Reviewed labs obtained at LOV: TG have improved, LDL improved from 110 to 61, and HDL at goal. Pt has not been taking Crestor  for several months. Decrease in LDL is purely due to lifestyle changes (diet/exercise).   Continue with current statin therapy as prescribed. Continue working on decreasing simple carbs and decreasing sat/trans fats. Recommend pt continue with regular exercise as able. Will continue monitoring as it relates to her wt loss journey.     Follow up:   Return in about 3 weeks (around 06/13/2024) for 3 week f/u. She was informed of the importance of frequent follow up visits to  maximize her success with intensive lifestyle modifications for her multiple health conditions.  Subjective:   Chief complaint: Obesity [name] is here to discuss her progress with her obesity treatment plan. She is on the Category 1 Plan and states she is following her eating plan approximately 80-90% of the time. She states she is doing strength training 30-45 minutes 6 days per week.    Interval History:  Thyra Yinger is here for a follow up office visit. Since last OV on 05/02/24, she is up 1 lb. She has tried journaling, but struggles with consistency. She reports journaling for 2 days after her LOV, but was unable to continue afterwards. She has also struggled with regular exercise due to sciatic pain when walking for long periods of time. She reports having an MRI scheduled soon.    Pharmacotherapy that aid with weight loss: She is currently taking Wellbutrin  SR 150 mg BID and Metformin  500 mg BID.    Review of Systems:  Pertinent positives were addressed with patient today.  Reviewed by clinician on day of visit: allergies, medications, problem list, medical history, surgical history, family history, social history, and previous encounter notes.  Weight Summary and Biometrics   Weight Lost Since Last Visit: 0  Weight  Gained Since Last Visit: 1lb   Vitals Temp: 98.7 F (37.1 C) BP: 102/69 Pulse Rate: 92 SpO2: 95 %   Anthropometric Measurements Height: 5' 2 (1.575 m) Weight: 234 lb (106.1 kg) BMI (Calculated): 42.79 Weight at Last Visit: 233lb Weight Lost Since Last Visit: 0 Weight Gained Since Last Visit: 1lb Starting Weight: 240lb Total Weight Loss (lbs): 6 lb (2.722 kg)   Body Composition  Body Fat %: 49.9 % Fat Mass (lbs): 117 lbs Muscle Mass (lbs): 111.4 lbs Total Body Water (lbs): 81.2 lbs Visceral Fat Rating : 17   Other Clinical Data Fasting: no Labs: no Today's Visit #: 49 Starting Date: 04/14/20    Objective:   PHYSICAL EXAM: Blood  pressure 102/69, pulse 92, temperature 98.7 F (37.1 C), height 5' 2 (1.575 m), weight 234 lb (106.1 kg), SpO2 95%. Body mass index is 42.8 kg/m.  General: she is overweight, cooperative and in no acute distress. PSYCH: Has normal mood, affect and thought process.   HEENT: EOMI, sclerae are anicteric. Lungs: Normal breathing effort, no conversational dyspnea. Extremities: Moves * 4 Neurologic: A and O * 3, good insight  DIAGNOSTIC DATA REVIEWED: BMET    Component Value Date/Time   NA 140 05/02/2024 1057   K 4.2 05/02/2024 1057   CL 100 05/02/2024 1057   CO2 23 05/02/2024 1057   GLUCOSE 81 05/02/2024 1057   GLUCOSE 95 11/12/2019 0930   BUN 17 05/02/2024 1057   CREATININE 1.04 (H) 05/02/2024 1057   CALCIUM  10.0 05/02/2024 1057   GFRNONAA 91 04/15/2020 1654   GFRAA 105 04/15/2020 1654   Lab Results  Component Value Date   HGBA1C 6.1 (H) 05/02/2024   HGBA1C 6.1 (H) 04/15/2020   Lab Results  Component Value Date   INSULIN  24.6 05/02/2024   INSULIN  21.9 04/15/2020   Lab Results  Component Value Date   TSH 0.95 12/30/2022   CBC    Component Value Date/Time   WBC 6.9 05/02/2024 1057   WBC 7.2 12/30/2022 1429   RBC 6.02 (H) 05/02/2024 1057   RBC 5.91 (H) 12/30/2022 1429   HGB 12.7 05/02/2024 1057   HCT 42.5 05/02/2024 1057   PLT 194 05/02/2024 1057   MCV 71 (L) 05/02/2024 1057   MCH 21.1 (L) 05/02/2024 1057   MCH 21.7 (L) 09/25/2016 0012   MCHC 29.9 (L) 05/02/2024 1057   MCHC 32.0 12/30/2022 1429   RDW 18.4 (H) 05/02/2024 1057   Iron  Studies    Component Value Date/Time   IRON  62 12/30/2022 1429   IRON  63 11/03/2021 0942   TIBC 365.4 12/30/2022 1429   TIBC 332 11/03/2021 0942   FERRITIN 38.7 12/30/2022 1429   FERRITIN 72 11/03/2021 0942   IRONPCTSAT 17.0 (L) 12/30/2022 1429   IRONPCTSAT 19 11/03/2021 0942   Lipid Panel     Component Value Date/Time   CHOL 144 05/02/2024 1057   TRIG 61 05/02/2024 1057   HDL 70 05/02/2024 1057   CHOLHDL 2.1  05/02/2024 1057   CHOLHDL 3 11/06/2010 1638   VLDL 20.8 11/06/2010 1638   LDLCALC 61 05/02/2024 1057   LDLDIRECT 96 11/22/2022 1446   Hepatic Function Panel     Component Value Date/Time   PROT 6.8 05/02/2024 1057   ALBUMIN 4.5 05/02/2024 1057   AST 15 05/02/2024 1057   ALT 12 05/02/2024 1057   ALKPHOS 93 05/02/2024 1057   BILITOT 0.3 05/02/2024 1057   BILIDIR 0.0 01/30/2013 1213      Component Value Date/Time  TSH 0.95 12/30/2022 1429   Nutritional Lab Results  Component Value Date   VD25OH 71.3 05/02/2024   VD25OH 33.1 09/05/2023   VD25OH 63.6 03/31/2023    Attestations:   I, Vernell Forest, acting as a medical scribe for Molly Jenkins, DO., have compiled all relevant documentation for today's office visit on behalf of Molly Jenkins, DO, while in the presence of [employer], DO.   I have reviewed the above documentation for accuracy and completeness, and I agree with the above. Molly JINNY Bean, D.O.  The 21st Century Cures Act was signed into law in 2016 which includes the topic of electronic health records.  This provides immediate access to information in MyChart.  This includes consultation notes, operative notes, office notes, lab results and pathology reports.  If you have any questions about what you read please let us  know at your next visit so we can discuss your concerns and take corrective action if need be.  We are right here with you.

## 2024-05-23 NOTE — Telephone Encounter (Signed)
 Called [employer] to verify patient's coverage eligibility. Talked to representative Gen (Call Reference # [id number]). Gen stated that patient's [employer] is not active. Gen states that the coverage started on 05/04/2024 and also ended on 05/04/2024. I advised the patient of this while she was here in the office. Patient stated that she will call [employer] to confirm. - [facility].

## 2024-05-23 NOTE — Progress Notes (Unsigned)
 Ben Jackson D.[facility] [address] [place] [address] Phone: [phone]   Assessment and Plan:     There are no diagnoses linked to this encounter.  ***   Pertinent previous records reviewed include ***    Follow Up: ***     Subjective:    Chief Complaint: right thigh pain  HPI:  04/26/2024 Patient is a 63 year old female with right thigh pain. Patient states pain for 3 weeks. No MOI. Hamstring pain. No radiating pain. Pain when getting up from a chair or bed. Pain feels like a muscle cramp or pinch.pain when she coughs. Tylenol  with codeine  has not helped.    05/24/24 Patient states   Relevant Historical Information: Hypertension, GERD  Additional pertinent review of systems negative.   Current Outpatient Medications:    acetaminophen  (TYLENOL ) 500 MG tablet, Take 1,000 mg by mouth every 6 (six) hours as needed for mild pain or headache., Disp: , Rfl:    acetaminophen -codeine  (TYLENOL  #3) 300-30 MG tablet, Take 1-2 tablets by mouth every 6 (six) hours as needed for moderate pain (pain score 4-6)., Disp: 15 tablet, Rfl: 0   buPROPion  (WELLBUTRIN  SR) 150 MG 12 hr tablet, Take 1 tablet (150 mg total) by mouth 2 (two) times daily., Disp: 180 tablet, Rfl: 0   clindamycin  (CLEOCIN -T) 1 % lotion, Apply topically daily., Disp: 60 mL, Rfl: 6   diazepam  (VALIUM ) 5 MG tablet, Place 1 tablet vaginally nightly as needed for muscle spasm/ pelvic pain., Disp: 40 tablet, Rfl: 0   estradiol  (ESTRACE ) 0.1 MG/GM vaginal cream, Place 0.5g nightly for two weeks then twice a week after, Disp: 30 g, Rfl: 11   Ferrous Sulfate (IRON ) 325 (65 Fe) MG TABS, Take 1 tablet (325 mg total) by mouth daily at 2 PM., Disp: 30 tablet, Rfl: 0   lidocaine -prilocaine  (EMLA ) cream, Apply 1 application. topically as needed., Disp: 30 g, Rfl: 5   linaclotide  (LINZESS ) 290 MCG CAPS capsule, Take 1 capsule (290 mcg total) by mouth daily before breakfast., Disp: 90  capsule, Rfl: 0   meloxicam  (MOBIC ) 15 MG tablet, Take 1 tablet (15 mg total) by mouth daily., Disp: 30 tablet, Rfl: 0   metFORMIN  (GLUCOPHAGE ) 500 MG tablet, Take 1 tablet (500 mg total) by mouth 2 (two) times daily with a meal., Disp: 180 tablet, Rfl: 0   minocycline  (DYNACIN ) 50 MG tablet, Take 1 tablet (50 mg total) by mouth 2 (two) times daily., Disp: 180 tablet, Rfl: 0   mirabegron  ER (MYRBETRIQ ) 25 MG TB24 tablet, Take 1 tablet (25 mg total) by mouth daily., Disp: 30 tablet, Rfl: 5   rifampin  (RIFADIN ) 300 MG capsule, Take 1 capsule (300 mg total) by mouth 2 (two) times daily. Take 1 capsule (300 mg total) by mouth 2 (two) times daily. Take for 7 days PRN for flares, Disp: 60 capsule, Rfl: 1   rosuvastatin  (CRESTOR ) 10 MG tablet, Take 1 tablet (10 mg total) by mouth daily. (Patient not taking: Reported on 05/23/2024), Disp: 90 tablet, Rfl: 1   Suvorexant  (BELSOMRA ) 15 MG TABS, Take 1 tablet (15 mg total) by mouth at bedtime as needed., Disp: 90 tablet, Rfl: 0   Vitamin D , Ergocalciferol , (DRISDOL ) 1.25 MG (50000 UNIT) CAPS capsule, 1 po q wed, and 1 po q sun, Disp: 24 capsule, Rfl: 0   Objective:     There were no vitals filed for this visit.    There is no height or weight on file  to calculate BMI.    Physical Exam:    ***   Electronically signed by:  Odis Mace D.[facility] 4:45 PM 05/23/24

## 2024-05-24 ENCOUNTER — Ambulatory Visit (INDEPENDENT_AMBULATORY_CARE_PROVIDER_SITE_OTHER)

## 2024-05-24 ENCOUNTER — Ambulatory Visit (INDEPENDENT_AMBULATORY_CARE_PROVIDER_SITE_OTHER): Admitting: Sports Medicine

## 2024-05-24 VITALS — BP 122/60 | HR 89 | Ht 62.0 in | Wt 238.0 lb

## 2024-05-24 DIAGNOSIS — G8929 Other chronic pain: Secondary | ICD-10-CM

## 2024-05-24 DIAGNOSIS — M5441 Lumbago with sciatica, right side: Secondary | ICD-10-CM | POA: Diagnosis not present

## 2024-05-24 DIAGNOSIS — M25551 Pain in right hip: Secondary | ICD-10-CM | POA: Diagnosis not present

## 2024-05-24 DIAGNOSIS — M79651 Pain in right thigh: Secondary | ICD-10-CM | POA: Diagnosis not present

## 2024-05-24 MED ORDER — METHYLPREDNISOLONE 4 MG PO TBPK: ORAL_TABLET | ORAL | 0 refills | Status: DC

## 2024-05-24 NOTE — Patient Instructions (Addendum)
 Lumbar x-ray  Right hip X-ray MRI lumbar and right hip to [facility] [place], [place] [address]) Stop meloxicam . Start prednisone  dose pack. Follow up 5 days after MRI.

## 2024-05-24 NOTE — Addendum Note (Signed)
 Addended by: TONNIE SHU D on: 05/24/2024 09:51 AM   Modules accepted: Orders

## 2024-05-25 ENCOUNTER — Encounter: Payer: Self-pay | Admitting: Dermatology

## 2024-05-28 NOTE — Telephone Encounter (Signed)
 I reviewed her labs.  The TB screening was negative.  We will discuss at her next follow up if she will be moving forward with a biologic.  No need to send any rx just yet.

## 2024-05-30 ENCOUNTER — Ambulatory Visit: Payer: Self-pay

## 2024-05-30 ENCOUNTER — Encounter: Payer: Self-pay | Admitting: Internal Medicine

## 2024-05-30 ENCOUNTER — Ambulatory Visit: Admitting: Internal Medicine

## 2024-05-30 VITALS — BP 130/74 | HR 61 | Temp 98.6°F | Ht 62.0 in | Wt 232.0 lb

## 2024-05-30 DIAGNOSIS — H35462 Secondary vitreoretinal degeneration, left eye: Secondary | ICD-10-CM | POA: Diagnosis not present

## 2024-05-30 DIAGNOSIS — H3322 Serous retinal detachment, left eye: Secondary | ICD-10-CM | POA: Diagnosis not present

## 2024-05-30 DIAGNOSIS — H35363 Drusen (degenerative) of macula, bilateral: Secondary | ICD-10-CM | POA: Diagnosis not present

## 2024-05-30 NOTE — Telephone Encounter (Signed)
 FYI Only or Action Required?: Action required by provider: request for appointment.  Patient was last seen in primary care on 05/23/2024 by Midge Sober, DO.  Called Nurse Triage reporting Eye Problem.  Symptoms began yesterday.  Interventions attempted: Rest, hydration, or home remedies.  Symptoms are: unchanged.  Triage Disposition: See HCP Within 4 Hours (Or PCP Triage)  Patient/caregiver understands and will follow disposition?: YesCopied from CRM [id number]. Topic: Clinical - Red Word Triage >> May 30, 2024  8:54 AM Suzen RAMAN wrote: Red Word that prompted transfer to Nurse Triage: left eye floaters and a flash of light. Denies pain Reason for Disposition  Many floaters in the eye  (Exception: Floater(s) are a chronic symptom and this is unchanged from patient's baseline pattern.)  Answer Assessment - Initial Assessment Questions Pt denies pain/headaches/vision loss.   1. DESCRIPTION: How has your vision changed? (e.g., complete vision loss, blurred vision, double vision, floaters, etc.)     floaters 2. LOCATION: One or both eyes? If one, ask: Which eye?     left 3. SEVERITY: Can you see anything? If Yes, ask: What can you see? (e.g., fine print)     yes 4. ONSET: When did this begin? Did it start suddenly or has this been gradual?     Yesterday  5. PATTERN: Does this come and go, or has it been constant since it started?     Constant  6. PAIN: Is there any pain in your eye(s)?  (Scale 1-10; or mild, moderate, severe)     denies 7. CONTACTS-GLASSES: Do you wear contacts or glasses?     glasses 8. CAUSE: What do you think is causing this visual problem?     Not sure 9. OTHER SYMPTOMS: Do you have any other symptoms? (e.g., confusion, headache, arm or leg weakness, speech problems)     Flashes of light  Protocols used: Vision Loss or Change-A-AH

## 2024-05-30 NOTE — Progress Notes (Signed)
 Subjective:    Patient ID: Molly Bean, female    DOB: 05-30-1961, 63 y.o.   MRN: [id number]      HPI Molly is here for  Chief Complaint  Patient presents with   Spots and/or Floaters    Noticed yesterday but only in the left eye    Discussed the use of AI scribe software for clinical note transcription with the patient, who gave verbal consent to proceed.  History of Present Illness Molly Bean is a 63 year old female who presents with visual disturbances in the left eye.  She describes seeing something 'hanging down' in her left eye that she cannot grab, which started yesterday. Last night, she experienced flashes of light and describes the visual disturbance as a 'stream' or 'curtain' that moves, particularly when she turns her head. The disturbance is visible on the medial and lateral sides of her vision and appears to move even when she is still.  No pain in the eye but notes a slight cloudiness in her vision on the affected side. No sensitivity to light, discharge, itching, or redness. She last visited the eye doctor in June and recalls being told her eyes were good at that time.  No other symptoms and confirms that the visual disturbance began suddenly.       Medications and allergies reviewed with patient and updated if appropriate.  Current Outpatient Medications on File Prior to Visit  Medication Sig Dispense Refill   acetaminophen  (TYLENOL ) 500 MG tablet Take 1,000 mg by mouth every 6 (six) hours as needed for mild pain or headache.     acetaminophen -codeine  (TYLENOL  #3) 300-30 MG tablet Take 1-2 tablets by mouth every 6 (six) hours as needed for moderate pain (pain score 4-6). 15 tablet 0   buPROPion  (WELLBUTRIN  SR) 150 MG 12 hr tablet Take 1 tablet (150 mg total) by mouth 2 (two) times daily. 180 tablet 0   clindamycin  (CLEOCIN -T) 1 % lotion Apply topically daily. 60 mL 6   diazepam  (VALIUM ) 5 MG tablet Place 1 tablet vaginally nightly as needed for  muscle spasm/ pelvic pain. 40 tablet 0   estradiol  (ESTRACE ) 0.1 MG/GM vaginal cream Place 0.5g nightly for two weeks then twice a week after 30 g 11   Ferrous Sulfate (IRON ) 325 (65 Fe) MG TABS Take 1 tablet (325 mg total) by mouth daily at 2 PM. 30 tablet 0   lidocaine -prilocaine  (EMLA ) cream Apply 1 application. topically as needed. 30 g 5   linaclotide  (LINZESS ) 290 MCG CAPS capsule Take 1 capsule (290 mcg total) by mouth daily before breakfast. 90 capsule 0   meloxicam  (MOBIC ) 15 MG tablet Take 1 tablet (15 mg total) by mouth daily. 30 tablet 0   metFORMIN  (GLUCOPHAGE ) 500 MG tablet Take 1 tablet (500 mg total) by mouth 2 (two) times daily with a meal. 180 tablet 0   methylPREDNISolone  (MEDROL  DOSEPAK) 4 MG TBPK tablet Follow instructions on package. 21 tablet 0   minocycline  (DYNACIN ) 50 MG tablet Take 1 tablet (50 mg total) by mouth 2 (two) times daily. 180 tablet 0   mirabegron  ER (MYRBETRIQ ) 25 MG TB24 tablet Take 1 tablet (25 mg total) by mouth daily. 30 tablet 5   rifampin  (RIFADIN ) 300 MG capsule Take 1 capsule (300 mg total) by mouth 2 (two) times daily. Take 1 capsule (300 mg total) by mouth 2 (two) times daily. Take for 7 days PRN for flares 60 capsule 1   rosuvastatin  (CRESTOR ) 10 MG tablet  Take 1 tablet (10 mg total) by mouth daily. 90 tablet 1   Suvorexant  (BELSOMRA ) 15 MG TABS Take 1 tablet (15 mg total) by mouth at bedtime as needed. 90 tablet 0   Vitamin D , Ergocalciferol , (DRISDOL ) 1.25 MG (50000 UNIT) CAPS capsule 1 po q wed, and 1 po q sun 24 capsule 0   No current facility-administered medications on file prior to visit.    Review of Systems  Eyes:  Positive for visual disturbance. Negative for photophobia, pain, discharge, redness and itching.       Flashes last night, a little cloudy vision  Neurological:  Positive for headaches (chronic - intermittent - nothing new). Negative for dizziness and light-headedness.       Objective:   Vitals:   05/30/24 0927  BP:  130/74  Pulse: 61  Temp: 98.6 F (37 C)  SpO2: 98%   BP Readings from Last 3 Encounters:  05/30/24 130/74  05/24/24 122/60  05/23/24 102/69   Wt Readings from Last 3 Encounters:  05/30/24 232 lb (105.2 kg)  05/24/24 238 lb (108 kg)  05/23/24 234 lb (106.1 kg)   Body mass index is 42.43 kg/m.    Physical Exam Constitutional:      General: She is not in acute distress.    Appearance: Normal appearance. She is not ill-appearing.  HENT:     Head: Normocephalic and atraumatic.  Eyes:     General:        Right eye: No discharge.        Left eye: No discharge.     Extraocular Movements: Extraocular movements intact.     Conjunctiva/sclera: Conjunctivae normal.  Skin:    General: Skin is warm and dry.     Findings: No rash.  Neurological:     Mental Status: She is alert.            Assessment & Plan:    Encounter Diagnosis  Name Primary?   Left retinal detachment Yes   Acute - started last night Flashes, cloudy vision, curtain appearing in left eye vision Concern for retinal detachment [facility]es [facility] -- needs to see ophthalmology asap Referral to [facility]

## 2024-05-30 NOTE — Patient Instructions (Addendum)
    See your eye doctor today --- your symptoms are concerning for a retinal detachment.

## 2024-05-30 NOTE — Addendum Note (Signed)
 Addended by: GEOFM GLADE PARAS on: 05/30/2024 10:03 AM   Modules accepted: Orders

## 2024-06-06 ENCOUNTER — Ambulatory Visit: Payer: Self-pay | Admitting: Sports Medicine

## 2024-06-08 ENCOUNTER — Telehealth: Payer: Self-pay

## 2024-06-08 NOTE — Telephone Encounter (Signed)
 Faxed back to [facility] 06/08/2024

## 2024-06-12 ENCOUNTER — Ambulatory Visit (INDEPENDENT_AMBULATORY_CARE_PROVIDER_SITE_OTHER)

## 2024-06-12 ENCOUNTER — Other Ambulatory Visit: Payer: Self-pay | Admitting: Sports Medicine

## 2024-06-12 DIAGNOSIS — M545 Low back pain, unspecified: Secondary | ICD-10-CM

## 2024-06-12 DIAGNOSIS — G8929 Other chronic pain: Secondary | ICD-10-CM | POA: Diagnosis not present

## 2024-06-12 DIAGNOSIS — M25551 Pain in right hip: Secondary | ICD-10-CM

## 2024-06-12 DIAGNOSIS — M79651 Pain in right thigh: Secondary | ICD-10-CM

## 2024-06-13 ENCOUNTER — Ambulatory Visit: Payer: Self-pay | Admitting: Sports Medicine

## 2024-06-19 NOTE — Progress Notes (Unsigned)
 [facility] [address] [place] [address] Phone: [phone]   Assessment and Plan:     1. Chronic right-sided low back pain with right-sided sciatica (Primary) 2. Lumbar radiculopathy -Chronic with exacerbation, subsequent visit - Continued right posterior hip pain radiating along right hamstring, consistent with lumbar radiculopathy from disc herniation at right-sided L5-S1 - No significant improvement despite meloxicam  course, HEP, relative rest - Reviewed MRI of lumbar spine and right hip.  Discussed only mild degenerative changes of right hip that would not clearly explain patient's ongoing pain.  Discussed right sided disc herniation at L5-S1 contacting right descending S1 nerve root that could explain patient's symptoms.  Mild left-sided herniations at L3-L4 and L4-L5 that do not explain patient's ongoing symptoms - Recommend right sided epidural CSI to L5-S1 - Use meloxicam  15 mg daily as needed for pain.  Recommend limiting chronic NSAIDs to 1-2 doses per week to prevent long-term side effects.   3. Anxiety state -Will provide Ativan  0.5 mg 1 to 2 tablets prior to procedure due to anxiety state related to procedure    Pertinent previous records reviewed include lumbar MRI and right hip MRI 06/12/2024   Follow Up: 2 weeks after epidural.  Could consider repeat epidural if necessary   Subjective:   I, Molly Bean, am serving as a Neurosurgeon for Doctor Morene Mace  Chief Complaint: right thigh pain   HPI:  04/26/2024 Patient is a 63 year old female with right thigh pain. Patient states pain for 3 weeks. No MOI. Hamstring pain. No radiating pain. Pain when getting up from a chair or bed. Pain feels like a muscle cramp or pinch.pain when she coughs. Tylenol  with codeine  has not helped.     05/24/24 Patient states the pain is still there. It is terrible when she gets out of the bed. The MRI did only the front of her  leg from groin to foot. The pain is in the back of her leg not the front. She was told that they were looking to see if there were any blood clots there. When she nods her head down she feels pain. The only thing that she can think of that caused the pain is when she was picking up a 60 lbs box at a store with improper lifting and twisting. Meloxicam  did not help at all.   06/20/2024 Patient states still has pain .   Relevant Historical Information: Hypertension, GERD  Additional pertinent review of systems negative.   Current Outpatient Medications:    acetaminophen  (TYLENOL ) 500 MG tablet, Take 1,000 mg by mouth every 6 (six) hours as needed for mild pain or headache., Disp: , Rfl:    acetaminophen -codeine  (TYLENOL  #3) 300-30 MG tablet, Take 1-2 tablets by mouth every 6 (six) hours as needed for moderate pain (pain score 4-6)., Disp: 15 tablet, Rfl: 0   buPROPion  (WELLBUTRIN  SR) 150 MG 12 hr tablet, Take 1 tablet (150 mg total) by mouth 2 (two) times daily., Disp: 180 tablet, Rfl: 0   clindamycin  (CLEOCIN -T) 1 % lotion, Apply topically daily., Disp: 60 mL, Rfl: 6   diazepam  (VALIUM ) 5 MG tablet, Place 1 tablet vaginally nightly as needed for muscle spasm/ pelvic pain., Disp: 40 tablet, Rfl: 0   estradiol  (ESTRACE ) 0.1 MG/GM vaginal cream, Place 0.5g nightly for two weeks then twice a week after, Disp: 30 g, Rfl: 11   Ferrous Sulfate (IRON ) 325 (65 Fe) MG TABS, Take 1 tablet (325 mg  total) by mouth daily at 2 PM., Disp: 30 tablet, Rfl: 0   lidocaine -prilocaine  (EMLA ) cream, Apply 1 application. topically as needed., Disp: 30 g, Rfl: 5   linaclotide  (LINZESS ) 290 MCG CAPS capsule, Take 1 capsule (290 mcg total) by mouth daily before breakfast., Disp: 90 capsule, Rfl: 0   meloxicam  (MOBIC ) 15 MG tablet, Take 1 tablet (15 mg total) by mouth daily., Disp: 30 tablet, Rfl: 0   metFORMIN  (GLUCOPHAGE ) 500 MG tablet, Take 1 tablet (500 mg total) by mouth 2 (two) times daily with a meal., Disp: 180  tablet, Rfl: 0   methylPREDNISolone  (MEDROL  DOSEPAK) 4 MG TBPK tablet, Follow instructions on package., Disp: 21 tablet, Rfl: 0   minocycline  (DYNACIN ) 50 MG tablet, Take 1 tablet (50 mg total) by mouth 2 (two) times daily., Disp: 180 tablet, Rfl: 0   mirabegron  ER (MYRBETRIQ ) 25 MG TB24 tablet, Take 1 tablet (25 mg total) by mouth daily., Disp: 30 tablet, Rfl: 5   rifampin  (RIFADIN ) 300 MG capsule, Take 1 capsule (300 mg total) by mouth 2 (two) times daily. Take 1 capsule (300 mg total) by mouth 2 (two) times daily. Take for 7 days PRN for flares, Disp: 60 capsule, Rfl: 1   rosuvastatin  (CRESTOR ) 10 MG tablet, Take 1 tablet (10 mg total) by mouth daily., Disp: 90 tablet, Rfl: 1   Suvorexant  (BELSOMRA ) 15 MG TABS, Take 1 tablet (15 mg total) by mouth at bedtime as needed., Disp: 90 tablet, Rfl: 0   Vitamin D , Ergocalciferol , (DRISDOL ) 1.25 MG (50000 UNIT) CAPS capsule, 1 po q wed, and 1 po q sun, Disp: 24 capsule, Rfl: 0   Objective:     Vitals:   06/20/24 1008  Pulse: 68  SpO2: 99%  Weight: 238 lb (108 kg)  Height: 5' 2 (1.575 m)      Body mass index is 43.53 kg/m.    Physical Exam:    General: awake, alert, and oriented no acute distress, nontoxic Skin: no suspicious lesions or rashes Neuro:sensation intact distally with no deficits, normal muscle tone, no atrophy, strength 5/5 in all tested lower ext groups Psych: normal mood and affect, speech clear   Right leg/hip: No deformity, swelling or wasting ROM Flexion 90, ext 30, IR 45, ER 45 TTP along entirety of hamstring musculature, worse on medial side, right lumbar spine NTTP over the hip flexors, greater trochanter, gluteal musculature, si joint,  Negative log roll with FROM Negative FABER Negative FADIR Negative Piriformis test Gait antalgic, favoring left leg No Pain with resisted knee flexion and hip extension along hamstring musculature Straight leg raise positive right    Electronically signed by:  Odis Mace  D.CLEMENTEEN [facility] 10:35 AM 06/20/24

## 2024-06-20 ENCOUNTER — Ambulatory Visit: Admitting: Sports Medicine

## 2024-06-20 VITALS — HR 68 | Ht 62.0 in | Wt 238.0 lb

## 2024-06-20 DIAGNOSIS — M5441 Lumbago with sciatica, right side: Secondary | ICD-10-CM | POA: Diagnosis not present

## 2024-06-20 DIAGNOSIS — G8929 Other chronic pain: Secondary | ICD-10-CM

## 2024-06-20 DIAGNOSIS — F411 Generalized anxiety disorder: Secondary | ICD-10-CM | POA: Diagnosis not present

## 2024-06-20 DIAGNOSIS — M5416 Radiculopathy, lumbar region: Secondary | ICD-10-CM

## 2024-06-20 NOTE — Patient Instructions (Addendum)
 Epidural right L5-S1  Ativan  to use before procedure   Follow up 2 weeks after to discuss results

## 2024-06-25 ENCOUNTER — Ambulatory Visit (INDEPENDENT_AMBULATORY_CARE_PROVIDER_SITE_OTHER): Admitting: Family Medicine

## 2024-07-03 ENCOUNTER — Encounter (INDEPENDENT_AMBULATORY_CARE_PROVIDER_SITE_OTHER): Payer: Self-pay | Admitting: Family Medicine

## 2024-07-03 ENCOUNTER — Ambulatory Visit (INDEPENDENT_AMBULATORY_CARE_PROVIDER_SITE_OTHER): Admitting: Family Medicine

## 2024-07-03 VITALS — BP 101/70 | HR 73 | Temp 98.4°F | Ht 62.0 in | Wt 236.0 lb

## 2024-07-03 DIAGNOSIS — M5136 Other intervertebral disc degeneration, lumbar region with discogenic back pain only: Secondary | ICD-10-CM | POA: Diagnosis not present

## 2024-07-03 DIAGNOSIS — R7303 Prediabetes: Secondary | ICD-10-CM

## 2024-07-03 DIAGNOSIS — E669 Obesity, unspecified: Secondary | ICD-10-CM

## 2024-07-03 DIAGNOSIS — Z6841 Body Mass Index (BMI) 40.0 and over, adult: Secondary | ICD-10-CM | POA: Diagnosis not present

## 2024-07-03 NOTE — Progress Notes (Signed)
 Molly Bean, D.O.  AB[facility], [facility] Specializing in Clinical Bariatric Medicine  Office located at: [address] [place], [place]  [address]      A) FOR THE CHRONIC DISEASE OF OBESITY:  Chief complaint: Obesity Molly Bean is here to discuss her progress with her obesity treatment plan.   History of present illness / Interval history:  Molly Bean is here today for her follow-up office visit.  Since last OV on 05/23/24, pt is up 2 lbs. Pt states that she is not doing good and has stopped any walking due to her back pain. She endorses focusing on her eating but not her exercising. She has been snacking on PB.     05/23/24 14:00 07/03/24 14:00   Body Fat % 49.9 % 51.7 %  Muscle Mass (lbs) 111.4 lbs 108.4 lbs  Fat Mass (lbs) 117 lbs 122.2 lbs  Total Body Water (lbs) 81.2 lbs 82.8 lbs   Counseling done on how various foods will affect these numbers and how to maximize success   Total lbs lost to date: - 4 lbs Total Fat Mass in lbs lost to date: - 1.6 lbs  Total weight loss percentage to date: - 1.67 %    Obesity, Beginning BMI 43.90 BMI 40.0-44.9, adult (HCC)- current bmi 43.15  Nutrition Therapy She is on the Category 1 Plan and states she is following her eating plan approximately 80 % of the time.   - Tracking Calories/Macros: no   - Eating More Whole Foods: yes  - Adequate Protein Intake: yes  - Adequate Water Intake: no   - Skipping Meals: no   - Sleeping 7-9 Hours/ Night: yes   [name] is currently in the action stage of change. As such, her goal is to continue weight management plan.  She has agreed to:  switch to journaling [phone] 80 + g protein    Physical Activity Pt is doing weights 45 minutes 5 days per week   Jnae has been advised to work up to 300-450 minutes of moderate intensity aerobic activity a week and strengthening exercises 2-3 times per week for cardiovascular health, weight loss maintenance and preservation of muscle  mass.  She has agreed to : Think about enjoyable ways to increase daily physical activity and overcoming barriers to exercise and Increase physical activity in their day and reduce sedentary time (increase NEAT).   Behavioral Modifications Evidence-based interventions for health behavior change were utilized today including the discussion of  1) self monitoring techniques:    - Focus  - Follow meal plan closer   2) problem-solving barriers:    -Journal   -Weigh everything  3) self care:    - Focus on yourself   4) SMART goals for next OV:    - Journal (by hand or app) everything you eat every day   Regarding patient's less desirable eating habits and patterns, we employed the technique of small changes.   We discussed the following today: increasing lean protein intake to established goals, work on tracking and journaling calories using tracking application, better snacking choices, and focusing on food with a 10:1 ratio of calories: grams of protein  Additional resources provided today: Handout on CAT 1 meal plan , Handout on Daily Food Journaling Log, and Provided patient with personalized instruction given on how to measure portions and weigh foods for accurate calculation of calories   Medical Interventions/ Pharmacotherapy Previous Bariatric surgery: n/a Pharmacotherapy for weight loss: She is currently taking Wellbutrin  SR  150 mg BID and Metformin  500 mg BID for medical weight loss.    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 Dr. Leonce recommended Epidural CSI to L5-S1 this will help relive  pressure off the nerve. Pt states she will be getting the shot on October 13th. She describes that her pain does not radiate beyond her knee. Will monitor alongside PCP.     Pre-diabetes Assessment & Plan Lab Results  Component Value Date   HGBA1C 6.1 (H) 05/02/2024   HGBA1C 6.0 (H) 09/05/2023   HGBA1C 5.8 (H) 03/31/2023   INSULIN  24.6 05/02/2024   INSULIN  13.1 09/05/2023   INSULIN  28.8 (H) 03/31/2023    On Metformin  500 mg BID with good compliance and tolerance. Good control of hunger and cravings. Will refill today continue medication and following prudent meal plan.     Follow up:   Return 07/25/2024 at 3:20 PM   She was informed of the importance of frequent follow up visits to maximize her success with intensive lifestyle modifications for her multiple health conditions.   Weight Summary and Biometrics   Weight Lost Since Last Visit: 0lb  Weight Gained Since Last Visit: 2lb    Vitals Temp: 98.4 F (36.9 C) BP: 101/70 Pulse Rate: 73 SpO2: 100 %   Anthropometric Measurements Height: 5' 2 (1.575 m) Weight: 236 lb (107 kg) BMI (Calculated): 43.15 Weight at Last Visit: 234lb Weight Lost Since Last Visit: 0lb Weight Gained Since Last Visit: 2lb Starting Weight: 240lb Total Weight Loss (lbs): 4 lb (1.814 kg)   Body Composition  Body Fat %: 51.7 % Fat Mass (lbs): 122.2 lbs Muscle Mass (lbs): 108.4 lbs Total Body Water (lbs): 82.8 lbs Visceral Fat Rating : 18   Other Clinical Data Fasting: no Labs: no Today's Visit #: 50 Starting Date: 04/14/20    Objective:   PHYSICAL EXAM: Blood pressure 101/70, pulse 73, temperature 98.4 F (36.9 C), height 5' 2 (1.575 m), weight 236 lb (107 kg), SpO2 100%. Body mass index is 43.16 kg/m.  General: she is overweight, cooperative and in no acute distress. PSYCH: Has normal mood, affect and thought process.   HEENT: EOMI, sclerae are anicteric. Lungs: Normal breathing effort, no conversational  dyspnea. Extremities: Moves * 4 Neurologic: A and O * 3, good insight  DIAGNOSTIC DATA REVIEWED: BMET    Component Value Date/Time   NA 140 05/02/2024 1057   K 4.2 05/02/2024 1057   CL 100 05/02/2024 1057   CO2 23 05/02/2024 1057   GLUCOSE 81 05/02/2024 1057   GLUCOSE 95 11/12/2019 0930   BUN 17 05/02/2024 1057   CREATININE 1.04 (H) 05/02/2024 1057   CALCIUM  10.0 05/02/2024 1057   GFRNONAA 91 04/15/2020 1654   GFRAA 105 04/15/2020 1654   Lab Results  Component Value Date   HGBA1C 6.1 (H) 05/02/2024   HGBA1C 6.1 (H) 04/15/2020   Lab Results  Component Value Date   INSULIN  24.6 05/02/2024   INSULIN  21.9 04/15/2020   Lab Results  Component Value Date   TSH 0.95 12/30/2022   CBC    Component Value Date/Time   WBC 6.9 05/02/2024 1057   WBC  7.2 12/30/2022 1429   RBC 6.02 (H) 05/02/2024 1057   RBC 5.91 (H) 12/30/2022 1429   HGB 12.7 05/02/2024 1057   HCT 42.5 05/02/2024 1057   PLT 194 05/02/2024 1057   MCV 71 (L) 05/02/2024 1057   MCH 21.1 (L) 05/02/2024 1057   MCH 21.7 (L) 09/25/2016 0012   MCHC 29.9 (L) 05/02/2024 1057   MCHC 32.0 12/30/2022 1429   RDW 18.4 (H) 05/02/2024 1057   Iron  Studies    Component Value Date/Time   IRON  62 12/30/2022 1429   IRON  63 11/03/2021 0942   TIBC 365.4 12/30/2022 1429   TIBC 332 11/03/2021 0942   FERRITIN 38.7 12/30/2022 1429   FERRITIN 72 11/03/2021 0942   IRONPCTSAT 17.0 (L) 12/30/2022 1429   IRONPCTSAT 19 11/03/2021 0942   Lipid Panel     Component Value Date/Time   CHOL 144 05/02/2024 1057   TRIG 61 05/02/2024 1057   HDL 70 05/02/2024 1057   CHOLHDL 2.1 05/02/2024 1057   CHOLHDL 3 11/06/2010 1638   VLDL 20.8 11/06/2010 1638   LDLCALC 61 05/02/2024 1057   LDLDIRECT 96 11/22/2022 1446   Hepatic Function Panel     Component Value Date/Time   PROT 6.8 05/02/2024 1057   ALBUMIN 4.5 05/02/2024 1057   AST 15 05/02/2024 1057   ALT 12 05/02/2024 1057   ALKPHOS 93 05/02/2024 1057   BILITOT 0.3 05/02/2024 1057    BILIDIR 0.0 01/30/2013 1213      Component Value Date/Time   TSH 0.95 12/30/2022 1429   Nutritional Lab Results  Component Value Date   VD25OH 71.3 05/02/2024   VD25OH 33.1 09/05/2023   VD25OH 63.6 03/31/2023    Attestations:   I, Sonny Laroche, acting as a Stage manager for Molly Jenkins, DO., have compiled all relevant documentation for today's office visit on behalf of Molly Jenkins, DO, while in the presence of [employer], DO.  I have spent 40 minutes in the care of the patient today including 30 minutes face-to-face assessing and reviewing listed medical problems above as outlined in office visit note and providing nutritional and behavioral counseling as outlined in obesity care plan.   I have reviewed the above documentation for accuracy and completeness, and I agree with the above. Molly JINNY Bean, D.O.  The 21st Century Cures Act was signed into law in 2016 which includes the topic of electronic health records.  This provides immediate access to information in MyChart.  This includes consultation notes, operative notes, office notes, lab results and pathology reports.  If you have any questions about what you read please let us  know at your next visit so we can discuss your concerns and take corrective action if need be.  We are right here with you.

## 2024-07-11 DIAGNOSIS — H35462 Secondary vitreoretinal degeneration, left eye: Secondary | ICD-10-CM | POA: Diagnosis not present

## 2024-07-11 DIAGNOSIS — H43392 Other vitreous opacities, left eye: Secondary | ICD-10-CM | POA: Diagnosis not present

## 2024-07-11 DIAGNOSIS — H35363 Drusen (degenerative) of macula, bilateral: Secondary | ICD-10-CM | POA: Diagnosis not present

## 2024-07-11 DIAGNOSIS — H43813 Vitreous degeneration, bilateral: Secondary | ICD-10-CM | POA: Diagnosis not present

## 2024-07-11 LAB — OPHTHALMOLOGY REPORT-SCANNED

## 2024-07-16 ENCOUNTER — Ambulatory Visit
Admission: RE | Admit: 2024-07-16 | Discharge: 2024-07-16 | Disposition: A | Source: Ambulatory Visit | Attending: Sports Medicine

## 2024-07-16 DIAGNOSIS — M5416 Radiculopathy, lumbar region: Secondary | ICD-10-CM

## 2024-07-16 DIAGNOSIS — G8929 Other chronic pain: Secondary | ICD-10-CM

## 2024-07-16 DIAGNOSIS — F411 Generalized anxiety disorder: Secondary | ICD-10-CM

## 2024-07-16 MED ORDER — METHYLPREDNISOLONE ACETATE 40 MG/ML INJ SUSP (RADIOLOG
80.0000 mg | Freq: Once | INTRAMUSCULAR | Status: AC
Start: 2024-07-16 — End: 2024-07-16
  Administered 2024-07-16: 80 mg via EPIDURAL

## 2024-07-16 MED ORDER — IOPAMIDOL (ISOVUE-M 200) INJECTION 41%
1.0000 mL | Freq: Once | INTRAMUSCULAR | Status: AC
  Administered 2024-07-16: 1 mL via EPIDURAL

## 2024-07-16 NOTE — Discharge Instructions (Signed)

## 2024-07-25 ENCOUNTER — Encounter (INDEPENDENT_AMBULATORY_CARE_PROVIDER_SITE_OTHER): Payer: Self-pay | Admitting: Family Medicine

## 2024-07-25 ENCOUNTER — Ambulatory Visit (INDEPENDENT_AMBULATORY_CARE_PROVIDER_SITE_OTHER): Admitting: Family Medicine

## 2024-07-25 VITALS — BP 125/81 | HR 75 | Temp 98.6°F | Ht 62.0 in | Wt 230.0 lb

## 2024-07-25 DIAGNOSIS — E669 Obesity, unspecified: Secondary | ICD-10-CM | POA: Diagnosis not present

## 2024-07-25 DIAGNOSIS — K5904 Chronic idiopathic constipation: Secondary | ICD-10-CM

## 2024-07-25 DIAGNOSIS — Z6841 Body Mass Index (BMI) 40.0 and over, adult: Secondary | ICD-10-CM | POA: Diagnosis not present

## 2024-07-25 DIAGNOSIS — K5909 Other constipation: Secondary | ICD-10-CM

## 2024-07-25 DIAGNOSIS — E559 Vitamin D deficiency, unspecified: Secondary | ICD-10-CM

## 2024-07-25 DIAGNOSIS — F39 Unspecified mood [affective] disorder: Secondary | ICD-10-CM | POA: Diagnosis not present

## 2024-07-25 DIAGNOSIS — R7303 Prediabetes: Secondary | ICD-10-CM | POA: Diagnosis not present

## 2024-07-25 DIAGNOSIS — F5089 Other specified eating disorder: Secondary | ICD-10-CM

## 2024-07-25 MED ORDER — BUPROPION HCL ER (SR) 150 MG PO TB12: 150.0000 mg | ORAL_TABLET | Freq: Two times a day (BID) | ORAL | 0 refills | Status: DC

## 2024-07-25 MED ORDER — LINACLOTIDE 290 MCG PO CAPS: 290.0000 ug | ORAL_CAPSULE | Freq: Every day | ORAL | 0 refills | Status: DC

## 2024-07-25 MED ORDER — VITAMIN D (ERGOCALCIFEROL) 1.25 MG (50000 UNIT) PO CAPS: ORAL_CAPSULE | ORAL | 0 refills | Status: DC

## 2024-07-25 MED ORDER — METFORMIN HCL 500 MG PO TABS: 500.0000 mg | ORAL_TABLET | Freq: Two times a day (BID) | ORAL | 0 refills | Status: DC

## 2024-07-25 NOTE — Progress Notes (Signed)
 Molly DOROTHA Bean, D.O.  ABFM, ABOM Specializing in Clinical Bariatric Medicine  Office located at: [address] [place], [place]  [address]    FOR THE CHRONIC DISEASE OF OBESITY:   Obesity, Beginning BMI 43.90; BMI 40.0-44.9, adult (HCC)- current bmi 42.06  Weight Summary and Body Composition Analysis  Weight Lost Since Last Visit: 6lb  Weight Gained Since Last Visit: 0    Vitals Temp: 98.6 F (37 C) BP: 125/81 Pulse Rate: 75 SpO2: 98 %   Anthropometric Measurements Height: 5' 2 (1.575 m) Weight: 230 lb (104.3 kg) BMI (Calculated): 42.06 Weight at Last Visit: 236lb Weight Lost Since Last Visit: 6lb Weight Gained Since Last Visit: 0 Starting Weight: 240lb Total Weight Loss (lbs): 10 lb (4.536 kg)   Body Composition  Body Fat %: 49.9 % Fat Mass (lbs): 115 lbs Muscle Mass (lbs): 109.6 lbs Total Body Water (lbs): 77.6 lbs Visceral Fat Rating : 17   Other Clinical Data Fasting: yes Labs: no Today's Visit #: 51 Starting Date: 04/14/20    Chief complaint: Obesity  Interval History Molly Bean is here for a follow-up office visit to discuss her progress with her obesity treatment plan. She is keeping a food journal and adhering to recommended goals of [phone] calories and 80+ grams protein and states she is following her eating plan approximately 85% of the time. She is doing weight training 45  minutes 5 days per week  She has experienced a weight loss of 6 lbs since last OV on 07/03/2024.   Her dietary and life habits include:  - Tracking Calories/Macros: yes - she started journaling after LOV, but only recorded the calories. Some days was over in calories from eating out; on a few occasions was under in calories (e.g 500 cal, 800 cal).   - Eating More Whole Foods: yes  - Adequate Protein Intake: yes  - Adequate Water Intake: yes  - Skipping Meals: no  - Sleeping 7-9 Hours/ Night: yes    07/03/24 14:00 07/25/24 15:00   Body Fat %  51.7 % 49.9 %  Muscle Mass (lbs) 108.4 lbs 109.6 lbs  Fat Mass (lbs) 122.2 lbs 115 lbs  Total Body Water (lbs) 82.8 lbs 77.6 lbs  Visceral Fat Rating  18 17    Counseling done on how various foods will affect these numbers and how to maximize success  Total lbs lost to date: - 10 lbs Total Fat Mass lost to date: - 8.8 lbs Total weight loss percentage to date: - 4.17 %   Nutritional and Behavioral Counseling:  We discussed the following today: increasing lean protein intake to established goals, high protein cereals, focusing on food with a 10:1 ratio of calories: grams of protein, importance of having a variety of fruits and vegetables, work on tracking and journaling calories using tracking application, and continue to work on implementation of reduced calorie nutritional plan  Additional resources provided today: n/a  Evidence-based interventions for health behavior change were utilized today including the discussion of self monitoring techniques, problem-solving barriers and SMART goal setting techniques.   Regarding patient's less desirable eating habits and patterns, we employed the technique of small changes.   SMART Goal(s) created today: n/a   Recommended Dietary Goals Molly Bean is currently in the action stage of change. As such, her goal is to continue weight management plan.  She has agreed to continue journaling 9050-1050 calorie and 80+ grams protein daily.   Recommended Physical Activity Goals Molly Bean has been advised  to work up to 300-450 minutes of moderate intensity aerobic activity a week and strengthening exercises 2-3 times per week for cardiovascular health, weight loss maintenance and preservation of muscle mass.   She may continue to gradually increase the amount and intensity of exercise routine   Medical Interventions and Pharmacotherapy Previous Bariatric surgery: n/a Pharmacotherapy:  Continue Metformin  and Wellbutrin  therapy.   OBESITY RELATED  CONDITIONS ADDRESSED TODAY:    Medications Discontinued During This Encounter  Medication Reason   Vitamin D , Ergocalciferol , (DRISDOL ) 1.25 MG (50000 UNIT) CAPS capsule Reorder   metFORMIN  (GLUCOPHAGE ) 500 MG tablet Reorder   buPROPion  (WELLBUTRIN  SR) 150 MG 12 hr tablet Reorder   linaclotide  (LINZESS ) 290 MCG CAPS capsule Reorder     Meds ordered this encounter  Medications   buPROPion  (WELLBUTRIN  SR) 150 MG 12 hr tablet    Sig: Take 1 tablet (150 mg total) by mouth 2 (two) times daily.    Dispense:  180 tablet    Refill:  0   linaclotide  (LINZESS ) 290 MCG CAPS capsule    Sig: Take 1 capsule (290 mcg total) by mouth daily before breakfast.    Dispense:  90 capsule    Refill:  0   metFORMIN  (GLUCOPHAGE ) 500 MG tablet    Sig: Take 1 tablet (500 mg total) by mouth 2 (two) times daily with a meal.    Dispense:  180 tablet    Refill:  0    Ov for rf   Vitamin D , Ergocalciferol , (DRISDOL ) 1.25 MG (50000 UNIT) CAPS capsule    Sig: 1 po q wed, and 1 po q sun    Dispense:  24 capsule    Refill:  0    Ov for rf     Pre-diabetes Assessment & Plan: Lab Results  Component Value Date   HGBA1C 6.1 (H) 05/02/2024   HGBA1C 6.0 (H) 09/05/2023   HGBA1C 5.8 (H) 03/31/2023   INSULIN  24.6 05/02/2024   INSULIN  13.1 09/05/2023   INSULIN  28.8 (H) 03/31/2023   On Metformin  500 mg two times daily  with reported good compliance and tolerance. Good control of hunger and cravings. No acute concerns. Continue Metformin  therapy. Cont working on nutrition plan to decrease simple carbohydrates, increase lean proteins and exercise to promote weight loss and improve glycemic control and prevent progression to T2DM.    Other constipation Assessment & Plan: Currently on Linzess  290 mcg daily. Overall well controlled currently. She is having one regular bowel movement each day. No acute concerns. Continue regimen. Adequate daily water and fiber  intake encouraged along with activity/ movement.      Mood disorder - emotional eating Assessment & Plan: Denies any SI/HI. Mood is stable. Cravings and hunger are well controlled. Doing well on Wellbutrin  SR 150 mg twice daily. No acute concerns. Continue regimen and prudent nutritional plan which can support emotional well-being.     Vitamin D  deficiency Assessment & Plan:  Lab Results  Component Value Date   VD25OH 71.3 05/02/2024   VD25OH 33.1 09/05/2023   VD25OH 63.6 03/31/2023   Pt is doing well on Ergocalciferol  50,000 units twice weekly. No acute concerns. Continue same regimen. Recheck as deemed medically necessary.    Objective:   PHYSICAL EXAM: Blood pressure 125/81, pulse 75, temperature 98.6 F (37 C), height 5' 2 (1.575 m), weight 230 lb (104.3 kg), SpO2 98%. Body mass index is 42.07 kg/m.  General: she is overweight, cooperative and in no acute distress. PSYCH: Has normal mood, affect and  thought process.   HEENT: EOMI, sclerae are anicteric. Lungs: Normal breathing effort, no conversational dyspnea. Extremities: Moves * 4 Neurologic: A and O * 3, good insight  DIAGNOSTIC DATA REVIEWED: BMET    Component Value Date/Time   NA 140 05/02/2024 1057   K 4.2 05/02/2024 1057   CL 100 05/02/2024 1057   CO2 23 05/02/2024 1057   GLUCOSE 81 05/02/2024 1057   GLUCOSE 95 11/12/2019 0930   BUN 17 05/02/2024 1057   CREATININE 1.04 (H) 05/02/2024 1057   CALCIUM  10.0 05/02/2024 1057   GFRNONAA 91 04/15/2020 1654   GFRAA 105 04/15/2020 1654   Lab Results  Component Value Date   HGBA1C 6.1 (H) 05/02/2024   HGBA1C 6.1 (H) 04/15/2020   Lab Results  Component Value Date   INSULIN  24.6 05/02/2024   INSULIN  21.9 04/15/2020   Lab Results  Component Value Date   TSH 0.95 12/30/2022   CBC    Component Value Date/Time   WBC 6.9 05/02/2024 1057   WBC 7.2 12/30/2022 1429   RBC 6.02 (H) 05/02/2024 1057   RBC 5.91 (H) 12/30/2022 1429   HGB 12.7 05/02/2024 1057   HCT 42.5 05/02/2024 1057   PLT 194 05/02/2024  1057   MCV 71 (L) 05/02/2024 1057   MCH 21.1 (L) 05/02/2024 1057   MCH 21.7 (L) 09/25/2016 0012   MCHC 29.9 (L) 05/02/2024 1057   MCHC 32.0 12/30/2022 1429   RDW 18.4 (H) 05/02/2024 1057   Iron  Studies    Component Value Date/Time   IRON  62 12/30/2022 1429   IRON  63 11/03/2021 0942   TIBC 365.4 12/30/2022 1429   TIBC 332 11/03/2021 0942   FERRITIN 38.7 12/30/2022 1429   FERRITIN 72 11/03/2021 0942   IRONPCTSAT 17.0 (L) 12/30/2022 1429   IRONPCTSAT 19 11/03/2021 0942   Lipid Panel     Component Value Date/Time   CHOL 144 05/02/2024 1057   TRIG 61 05/02/2024 1057   HDL 70 05/02/2024 1057   CHOLHDL 2.1 05/02/2024 1057   CHOLHDL 3 11/06/2010 1638   VLDL 20.8 11/06/2010 1638   LDLCALC 61 05/02/2024 1057   LDLDIRECT 96 11/22/2022 1446   Hepatic Function Panel     Component Value Date/Time   PROT 6.8 05/02/2024 1057   ALBUMIN 4.5 05/02/2024 1057   AST 15 05/02/2024 1057   ALT 12 05/02/2024 1057   ALKPHOS 93 05/02/2024 1057   BILITOT 0.3 05/02/2024 1057   BILIDIR 0.0 01/30/2013 1213      Component Value Date/Time   TSH 0.95 12/30/2022 1429   Nutritional Lab Results  Component Value Date   VD25OH 71.3 05/02/2024   VD25OH 33.1 09/05/2023   VD25OH 63.6 03/31/2023     Follow up:   Return 08/15/2024 at 3:20 PM.  She was informed of the importance of frequent follow up visits to maximize her success with intensive lifestyle modifications for her multiple health conditions.   Attestations:   I, Special Puri, acting as a stage manager for [employer], DO., have compiled all relevant documentation for today's office visit on behalf of Molly Jenkins, DO, while in the presence of [employer], DO.  Pertinent positives were addressed with patient today. Reviewed by clinician on day of visit: allergies, medications, problem list, medical history, surgical history, family history, social history, and previous encounter notes.  I have reviewed the above  documentation for accuracy and completeness, and I agree with the above. Molly Bean, D.O.  The 21st Century Cures Act was  signed into law in 2016 which includes the topic of electronic health records.  This provides immediate access to information in MyChart. This includes consultation notes, operative notes, office notes, lab results and pathology reports.  If you have any questions about what you read please let us  know at your next visit so we can discuss your concerns and take corrective action if need be.  We are right here with you.

## 2024-08-09 ENCOUNTER — Telehealth: Payer: Self-pay | Admitting: Sports Medicine

## 2024-08-09 NOTE — Telephone Encounter (Signed)
 Patient called and stated she received a shot in her back at [facility] and they said if any more problems to schedule another one. She would like to schedule another shot in her back as soon as possible. Please advise.

## 2024-08-10 ENCOUNTER — Emergency Department (HOSPITAL_COMMUNITY)

## 2024-08-10 ENCOUNTER — Ambulatory Visit: Payer: Self-pay

## 2024-08-10 ENCOUNTER — Emergency Department (HOSPITAL_COMMUNITY)
Admission: EM | Admit: 2024-08-10 | Discharge: 2024-08-10 | Disposition: A | Discharge status: Home / Self Care | Source: Ambulatory Visit | Attending: Emergency Medicine | Admitting: Emergency Medicine

## 2024-08-10 DIAGNOSIS — X58XXXA Exposure to other specified factors, initial encounter: Secondary | ICD-10-CM | POA: Insufficient documentation

## 2024-08-10 DIAGNOSIS — S39012A Strain of muscle, fascia and tendon of lower back, initial encounter: Secondary | ICD-10-CM | POA: Diagnosis not present

## 2024-08-10 DIAGNOSIS — M545 Low back pain, unspecified: Secondary | ICD-10-CM | POA: Diagnosis present

## 2024-08-10 MED ORDER — LIDOCAINE 5 % EX PTCH
1.0000 | MEDICATED_PATCH | CUTANEOUS | Status: DC
  Administered 2024-08-10: 1 via TRANSDERMAL
  Filled 2024-08-10: qty 1

## 2024-08-10 MED ORDER — METHYLPREDNISOLONE 4 MG PO TBPK: ORAL_TABLET | ORAL | 0 refills | Status: AC

## 2024-08-10 MED ORDER — METHOCARBAMOL 500 MG PO TABS
1000.0000 mg | ORAL_TABLET | Freq: Once | ORAL | Status: AC
  Administered 2024-08-10: 1000 mg via ORAL
  Filled 2024-08-10: qty 2

## 2024-08-10 MED ORDER — OXYCODONE HCL 5 MG PO TABS: 5.0000 mg | ORAL_TABLET | Freq: Four times a day (QID) | ORAL | 0 refills | Status: AC | PRN

## 2024-08-10 MED ORDER — METHOCARBAMOL 500 MG PO TABS: 500.0000 mg | ORAL_TABLET | Freq: Two times a day (BID) | ORAL | 0 refills | Status: AC

## 2024-08-10 MED ORDER — DEXAMETHASONE 4 MG PO TABS
4.0000 mg | ORAL_TABLET | Freq: Once | ORAL | Status: AC
  Administered 2024-08-10: 4 mg via ORAL
  Filled 2024-08-10: qty 1

## 2024-08-10 MED ORDER — LIDOCAINE 5 % EX PTCH: 1.0000 | MEDICATED_PATCH | CUTANEOUS | 0 refills | Status: AC

## 2024-08-10 MED ORDER — KETOROLAC TROMETHAMINE 15 MG/ML IJ SOLN
15.0000 mg | Freq: Once | INTRAMUSCULAR | Status: AC
Start: 2024-08-10 — End: 2024-08-10
  Administered 2024-08-10: 15 mg via INTRAMUSCULAR
  Filled 2024-08-10: qty 1

## 2024-08-10 NOTE — Telephone Encounter (Signed)
 FYI Only or Action Required?: FYI only for provider: ED advised.  Patient was last seen in primary care on 07/25/2024 by Midge Sober, DO.  Called Nurse Triage reporting Back Pain.  Symptoms began today.  Interventions attempted: Nothing.  Symptoms are: rapidly worsening.  Triage Disposition: Go to ED Now (Notify PCP)  Patient/caregiver understands and will follow disposition?: Yes   Copied from CRM [id number]. Topic: Clinical - Red Word Triage >> Aug 10, 2024  9:39 AM Molly Bean wrote: [facility] that prompted transfer to Nurse Triage: Patient states she is having severe pain in her back and in the back of her right leg,the pain is so bad she can't walk or move, husband has to help  her get in the bed. Reason for Disposition  [1] SEVERE back pain (e.g., excruciating) AND [2] sudden onset AND [3] age > 60 years  Answer Assessment - Initial Assessment Questions Due to severity of symptoms all triage questions were not answered. Dispo to ED, patient in agreement and proceeding to [facility] ED.     1. ONSET: When did the pain begin? (e.g., minutes, hours, days)     Short time ago  2. LOCATION: Where does it hurt? (upper, mid or lower back)     Back  3. SEVERITY: How bad is the pain?  (e.g., Scale 1-10; mild, moderate, or severe)     Severe-almost went to the floor, difficulty walking and needing assistance.  4. PATTERN: Is the pain constant? (e.g., yes, no; constant, intermittent)      Constant  5. RADIATION: Does the pain shoot into your legs or somewhere else?     Denies  6. CAUSE:  What do you think is causing the back pain?      Putting pants on when she lifted her leg when she felt popping sensation, immediate severe pain and almost went to the floor.  7. BACK OVERUSE:  Any recent lifting of heavy objects, strenuous work or exercise?     no 8. MEDICINES: What have you taken so far for the pain? (e.g., nothing, acetaminophen , NSAIDS)      9. NEUROLOGIC  SYMPTOMS: Do you have any weakness, numbness, or problems with bowel/bladder control?      10. OTHER SYMPTOMS: Do you have any other symptoms? (e.g., fever, abdomen pain, burning with urination, blood in urine)       Conversational dyspnea noted.  11. PREGNANCY: Is there any chance you are pregnant? When was your last menstrual period?  Protocols used: Back Pain-A-AH

## 2024-08-10 NOTE — Discharge Instructions (Signed)
 Your back pain is most likely due to a muscular strain.  There is been a lot of research on back pain, unfortunately the only thing that seems to really help is Tylenol  and ibuprofen .  Relative rest is also important to not lift greater than 10 pounds bending or twisting at the waist.  Please follow-up with your family physician.  The other thing that really seems to benefit patients is physical therapy which your doctor may send you for.  Please return to the emergency department for new numbness or weakness to your arms or legs. Difficulty with urinating or urinating or pooping on yourself.  Also if you cannot feel toilet paper when you wipe or get a fever.   Take 4 over the counter ibuprofen  tablets 3 times a day or 2 over-the-counter naproxen  tablets twice a day for pain. Also take tylenol  1000mg (2 extra strength) four times a day.   Additionally I have given you a prescription for robaxin , a few doses of oxycodone,  a medrol  dosepak, and lidocaine  patches for management of severe breakthrough pain. Do not drive or operate heavy machinery while taking the robaxin  or the oxycodone as these can make you drowsy.  Also exercise extreme caution with taking his medicines as they can increase your fall risk.  Take the steroids (medrol ) as prescribed in its entirety.  Call your sports medicine specialist who has been managing your back problems for close follow-up as well.   Return if development of any new or worsening symptoms.

## 2024-08-10 NOTE — ED Triage Notes (Addendum)
 C/o pain in the back of her legs and told it was due to her sciatic nerve. Cat CT, MRI, and told she had a bulging disc against a nerve. Received a shot in her back recently. Was attempting to put her pants on Wednesday last week and hurt her back more. Has been having difficulties with her ADL's because of this.

## 2024-08-10 NOTE — ED Provider Notes (Signed)
 [place] EMERGENCY DEPARTMENT AT [facility] Provider Note   CSN: [id number] Arrival date & time: 08/10/24  1057     Patient presents with: No chief complaint on file.   Molly Bean is a 63 y.o. female.   Patient with history of obesity, chronic back pain presents today with complaints of back pain. Reports that her pain began last week when she was putting her pants on.  Reports that she raised her leg up and felt pain in her low back area. Reports that this pain is completely different from her chronic pain. States that this pain is in her low back area and does not radiate. She states the pain she had previously was in her right leg, had MRI done in September 2025, and was diagnosed with sciatica. She had a steroid injection at that time and since then the pain in her leg has resolved. Reports this episode is more severe, feels sharp and is isolated to her low back. Denies numbness/tingling in her extremities, loss of bowel or bladder function or saddle anesthesia. She has had some issues with ambulation and ADLs due to pain. She took a few of her husbands muscle relaxers without significant improvement, has not had anything else for pain. Denies urinary symptoms.   The history is provided by the patient. No language interpreter was used.       Prior to Admission medications   Medication Sig Start Date End Date Taking? Authorizing Provider  acetaminophen  (TYLENOL ) 500 MG tablet Take 1,000 mg by mouth every 6 (six) hours as needed for mild pain or headache.    [provider]  acetaminophen -codeine  (TYLENOL  #3) 300-30 MG tablet Take 1-2 tablets by mouth every 6 (six) hours as needed for moderate pain (pain score 4-6). 04/19/24   Purcell Emil Schanz, MD  buPROPion  (WELLBUTRIN  SR) 150 MG 12 hr tablet Take 1 tablet (150 mg total) by mouth 2 (two) times daily. 07/25/24   Opalski, Barnie, DO  clindamycin  (CLEOCIN -T) 1 % lotion Apply topically daily. 04/09/24 04/09/25   Alm Delon SAILOR, DO  diazepam  (VALIUM ) 5 MG tablet Place 1 tablet vaginally nightly as needed for muscle spasm/ pelvic pain. 05/18/22   Marilynne Rosaline SAILOR, MD  estradiol  (ESTRACE ) 0.1 MG/GM vaginal cream Place 0.5g nightly for two weeks then twice a week after 01/13/22   Marilynne Rosaline SAILOR, MD  Ferrous Sulfate (IRON ) 325 (65 Fe) MG TABS Take 1 tablet (325 mg total) by mouth daily at 2 PM. 09/16/22   Opalski, Barnie, DO  lidocaine -prilocaine  (EMLA ) cream Apply 1 application. topically as needed. 01/13/22   Marilynne Rosaline SAILOR, MD  linaclotide  (LINZESS ) 290 MCG CAPS capsule Take 1 capsule (290 mcg total) by mouth daily before breakfast. 07/25/24   Opalski, Barnie, DO  meloxicam  (MOBIC ) 15 MG tablet Take 1 tablet (15 mg total) by mouth daily. 04/26/24   Leonce Katz, DO  metFORMIN  (GLUCOPHAGE ) 500 MG tablet Take 1 tablet (500 mg total) by mouth 2 (two) times daily with a meal. 07/25/24   Opalski, Deborah, DO  methylPREDNISolone  (MEDROL  DOSEPAK) 4 MG TBPK tablet Follow instructions on package. 05/24/24   Leonce Katz, DO  minocycline  (DYNACIN ) 50 MG tablet Take 1 tablet (50 mg total) by mouth 2 (two) times daily. 01/13/23   Joshua Debby CROME, MD  mirabegron  ER (MYRBETRIQ ) 25 MG TB24 tablet Take 1 tablet (25 mg total) by mouth daily. 09/02/21   Marilynne Rosaline SAILOR, MD  rifampin  (RIFADIN ) 300 MG capsule Take 1 capsule (300 mg  total) by mouth 2 (two) times daily. Take 1 capsule (300 mg total) by mouth 2 (two) times daily. Take for 7 days PRN for flares 04/09/24   Alm Delon SAILOR, DO  rosuvastatin  (CRESTOR ) 10 MG tablet Take 1 tablet (10 mg total) by mouth daily. 01/13/23   Joshua Debby CROME, MD  Suvorexant  (BELSOMRA ) 15 MG TABS Take 1 tablet (15 mg total) by mouth at bedtime as needed. 01/13/23   Joshua Debby CROME, MD  Vitamin D , Ergocalciferol , (DRISDOL ) 1.25 MG (50000 UNIT) CAPS capsule 1 po q wed, and 1 po q sun 07/25/24   Opalski, Barnie, DO    Allergies: Shellfish allergy , Hydrocodone ,  Oxycodone, and Pennsaid  [diclofenac  sodium]    Review of Systems  Musculoskeletal:  Positive for back pain.  All other systems reviewed and are negative.   Updated Vital Signs BP (!) 158/90 (BP Location: Left Arm)   Pulse 68   Temp 98.4 F (36.9 C) (Oral)   Resp 18   SpO2 100%   Physical Exam Vitals and nursing note reviewed.  Constitutional:      General: She is not in acute distress.    Appearance: Normal appearance. She is normal weight. She is not ill-appearing, toxic-appearing or diaphoretic.  HENT:     Head: Normocephalic and atraumatic.  Cardiovascular:     Rate and Rhythm: Normal rate.  Pulmonary:     Effort: Pulmonary effort is normal. No respiratory distress.  Abdominal:     General: Abdomen is flat.     Palpations: Abdomen is soft.     Tenderness: There is no abdominal tenderness.  Musculoskeletal:        General: Normal range of motion.     Cervical back: Normal range of motion.     Comments: TTP noted to the lower lumbar spine and surrounding musculature. No stepoffs, lesions, deformity, or overlying skin changes. -SLR bilaterally. Good distal pulses and sensation. After pain medication, patient observed to be ambulatory with slow but steady gait.   Skin:    General: Skin is warm and dry.  Neurological:     General: No focal deficit present.     Mental Status: She is alert.  Psychiatric:        Mood and Affect: Mood normal.        Behavior: Behavior normal.     (all labs ordered are listed, but only abnormal results are displayed) Labs Reviewed - No data to display  EKG: None  Radiology: DG Lumbar Spine Complete Result Date: 08/10/2024 EXAM: 4 VIEW(S) XRAY OF THE LUMBAR SPINE 08/10/2024 01:08:00 PM COMPARISON: 05/24/2024 CLINICAL HISTORY: back pain FINDINGS: LUMBAR SPINE: BONES: No acute fracture. Alignment is normal. DISCS AND DEGENERATIVE CHANGES: Redemonstrated mild disc height loss and endplate degenerative changes at L2-L3 and L3-L4. SOFT  TISSUES: No acute abnormality. IMPRESSION: 1. No acute osseous abnormality of the lumbar spine. 2. Mild disc height loss and endplate degenerative changes at L2-L3 and L3-L4. Electronically signed by: Shahmeer Lateef MD 08/10/2024 02:21 PM EST RP Workstation: [id number]     Procedures   Medications Ordered in the ED  lidocaine  (LIDODERM ) 5 % 1 patch (1 patch Transdermal Patch Applied 08/10/24 1414)  ketorolac  (TORADOL ) 15 MG/ML injection 15 mg (15 mg Intramuscular Given 08/10/24 1413)  dexamethasone  (DECADRON ) tablet 4 mg (4 mg Oral Given 08/10/24 1413)  methocarbamol  (ROBAXIN ) tablet 1,000 mg (1,000 mg Oral Given 08/10/24 1413)  Medical Decision Making Amount and/or Complexity of Data Reviewed Radiology: ordered.  Risk Prescription drug management.   Patient presents today with complaints of with back pain x 7 days.  They are afebrile, nontoxic-appearing, and in no acute distress reassuring vital signs.  Physical exam reveals TTP noted to the lower lumbar spine and surrounding musculature. No stepoffs, lesions, deformity, or overlying skin changes. -SLR bilaterally. Good distal pulses and sensation. After pain medication, patient observed to be ambulatory with slow but steady gait. . My emergent differential diagnosis includes slipped disc, compression fracture, spondylolisthesis, less clinical concern for epidural abscess or osteomyelitis based on patient history.  Overall with high clinical suspicion for lumbar sprain, sciatica based on clinical presentation, risk factors.  No neurological deficits. Patient is ambulatory. No warning symptoms of back pain including: fecal incontinence, urinary retention or overflow incontinence, night sweats, waking from sleep with back pain, unexplained fevers or weight loss, h/o cancer, IVDU, recent trauma. Overall low clinical concern for cauda equina, epidural abscess, or other serious cause of back pain.  X-ray imaging  ordered and obtained.  Patient's request of the lumbar spine which has resulted and reveals  1. No acute osseous abnormality of the lumbar spine. 2. Mild disc height loss and endplate degenerative changes at L2-L3 and L3-L4.  I personally reviewed and interpreted this imaging and agree with radiology interpretation  Chart reviewed, patient has following with sports medicine Dr. Leonce, had MRI in September which showed mild changes consistent with mild radicular symptoms. This was addressed with a steroid injection in September as well.    After above interventions, patient feels significantly improved, is now able to walk without assistance and with some discomfort. She feels well to go home.  Conservative measures such as rest, ice/heat, ibuprofen , Tylenol , and  prescription for Robaxin  indicated with orthopedic follow-up if no improvement with conservative management.  Patient advised not to drive or operate heavy machinery while taking Robaxin . Will also give a few doses of oxycodone for severe breakthrough pain per her request. She does have a listed allergy  to oxycodone, but states this reaction was greater than 30 years ago and she did have a dose recently without any adverse reaction. PDMP reviewed. Also given medrol  dosepak.  Extensive return precautions given. Evaluation and diagnostic testing in the emergency department does not suggest an emergent condition requiring admission or immediate intervention beyond what has been performed at this time.  Plan for discharge with close PCP follow-up.  Patient is understanding and amenable with plan, educated on red flag symptoms that would prompt immediate return.  Patient discharged in stable condition.  Final diagnoses:  Strain of lumbar region, initial encounter    ED Discharge Orders          Ordered    methylPREDNISolone  (MEDROL  DOSEPAK) 4 MG TBPK tablet        08/10/24 1502    methocarbamol  (ROBAXIN ) 500 MG tablet  2 times daily         08/10/24 1502    lidocaine  (LIDODERM ) 5 %  Every 24 hours        08/10/24 1502    oxyCODONE (ROXICODONE) 5 MG immediate release tablet  Every 6 hours PRN        08/10/24 1502          An After Visit Summary was printed and given to the patient.      Jamye Balicki A, PA-C 08/10/24 1507    Kingsley, Victoria K, DO 08/10/24 1644

## 2024-08-13 ENCOUNTER — Other Ambulatory Visit: Payer: Self-pay | Admitting: Sports Medicine

## 2024-08-13 DIAGNOSIS — M79651 Pain in right thigh: Secondary | ICD-10-CM

## 2024-08-13 DIAGNOSIS — M5416 Radiculopathy, lumbar region: Secondary | ICD-10-CM

## 2024-08-13 DIAGNOSIS — M25551 Pain in right hip: Secondary | ICD-10-CM

## 2024-08-13 DIAGNOSIS — F411 Generalized anxiety disorder: Secondary | ICD-10-CM

## 2024-08-13 DIAGNOSIS — S76311A Strain of muscle, fascia and tendon of the posterior muscle group at thigh level, right thigh, initial encounter: Secondary | ICD-10-CM

## 2024-08-13 DIAGNOSIS — G8929 Other chronic pain: Secondary | ICD-10-CM

## 2024-08-13 NOTE — Progress Notes (Signed)
 right sided epidural CSI to L5-S1

## 2024-08-15 ENCOUNTER — Ambulatory Visit (INDEPENDENT_AMBULATORY_CARE_PROVIDER_SITE_OTHER): Payer: Self-pay | Admitting: Family Medicine

## 2024-08-27 NOTE — Discharge Instructions (Signed)

## 2024-08-28 ENCOUNTER — Ambulatory Visit
Admission: RE | Admit: 2024-08-28 | Discharge: 2024-08-28 | Disposition: A | Discharge status: Home / Self Care | Source: Ambulatory Visit | Attending: Sports Medicine | Admitting: Sports Medicine

## 2024-08-28 DIAGNOSIS — F411 Generalized anxiety disorder: Secondary | ICD-10-CM

## 2024-08-28 DIAGNOSIS — M5416 Radiculopathy, lumbar region: Secondary | ICD-10-CM

## 2024-08-28 DIAGNOSIS — M25551 Pain in right hip: Secondary | ICD-10-CM

## 2024-08-28 DIAGNOSIS — G8929 Other chronic pain: Secondary | ICD-10-CM

## 2024-08-28 DIAGNOSIS — M79651 Pain in right thigh: Secondary | ICD-10-CM

## 2024-08-28 DIAGNOSIS — S76311A Strain of muscle, fascia and tendon of the posterior muscle group at thigh level, right thigh, initial encounter: Secondary | ICD-10-CM

## 2024-08-28 MED ORDER — METHYLPREDNISOLONE ACETATE 40 MG/ML INJ SUSP (RADIOLOG: 80.0000 mg | Freq: Once | INTRAMUSCULAR | Status: DC

## 2024-08-28 MED ORDER — IOPAMIDOL (ISOVUE-M 200) INJECTION 41%: 1.0000 mL | Freq: Once | INTRAMUSCULAR | Status: DC

## 2024-09-11 ENCOUNTER — Encounter (INDEPENDENT_AMBULATORY_CARE_PROVIDER_SITE_OTHER): Payer: Self-pay | Admitting: Family Medicine

## 2024-09-11 ENCOUNTER — Ambulatory Visit (INDEPENDENT_AMBULATORY_CARE_PROVIDER_SITE_OTHER): Admitting: Family Medicine

## 2024-09-11 VITALS — BP 120/75 | HR 71 | Temp 98.0°F | Ht 62.0 in | Wt 227.0 lb

## 2024-09-11 DIAGNOSIS — F5089 Other specified eating disorder: Secondary | ICD-10-CM | POA: Diagnosis not present

## 2024-09-11 DIAGNOSIS — F39 Unspecified mood [affective] disorder: Secondary | ICD-10-CM | POA: Diagnosis not present

## 2024-09-11 DIAGNOSIS — E669 Obesity, unspecified: Secondary | ICD-10-CM | POA: Diagnosis not present

## 2024-09-11 DIAGNOSIS — Z6841 Body Mass Index (BMI) 40.0 and over, adult: Secondary | ICD-10-CM

## 2024-09-11 DIAGNOSIS — R7303 Prediabetes: Secondary | ICD-10-CM

## 2024-09-11 DIAGNOSIS — K5904 Chronic idiopathic constipation: Secondary | ICD-10-CM | POA: Diagnosis not present

## 2024-09-11 MED ORDER — VITAMIN D (ERGOCALCIFEROL) 1.25 MG (50000 UNIT) PO CAPS: ORAL_CAPSULE | ORAL | 0 refills | Status: DC

## 2024-09-11 NOTE — Progress Notes (Signed)
 Molly Bean, D.O.  ABFM, ABOM Specializing in Clinical Bariatric Medicine  Office located at: [address] [place], [place]  [address]    Obtain fasting labs at next OV  A) FOR THE CHRONIC DISEASE OF OBESITY:  Chief complaint: Obesity Molly Bean is here to discuss her progress with her obesity treatment plan.   History of present illness / Interval history:  Molly Bean is here today for her follow-up office visit.  Since last OV on 07/25/24, pt is down 3 lbs. Patient states that since she has been journaling she noticed that she is not getting 80 to 100 g of protein. She averages about 72 g of protein per day. She endorses not being over 1140 calories per day.     07/25/24 15:00 09/11/24 15:00   Body Fat % 49.9 % 49.4 %  Muscle Mass (lbs) 109.6 lbs 109.2 lbs  Fat Mass (lbs) 115 lbs 112.2 lbs  Total Body Water (lbs) 77.6 lbs 76.2 lbs  Visceral Fat Rating  17 17   Counseling done on how various foods will affect these numbers and how to maximize success.   Total lbs lost to date: -13 lbs Total Fat Mass in lbs lost to date: - 14 lbs Total weight loss percentage to date: - 5.42 %    Obesity, Beginning BMI 43.90 BMI 40.0-44.9, adult (HCC)- current bmi 41.51  Nutrition Therapy She is journaling [phone] calorie and 80+ grams protein daily and states she is following her eating plan approximately 90 % of the time.   - Tracking Calories/Macros: yes  - Eating More Whole Foods: yes  - Adequate Protein Intake: no  - Adequate Water Intake: no   - Skipping Meals: no   - Sleeping 7-9 Hours/ Night: yes   Charlayne is currently in the action stage of change. As such, her goal is to continue weight management plan.  She has agreed to: continue current plan   Physical Activity Cherylynn is walking 60  minutes 6 days per week   Tianah has been advised to work up to 300-450 minutes of moderate intensity aerobic activity a week and strengthening exercises  2-3 times per week for cardiovascular health, weight loss maintenance and preservation of muscle mass.  She has agreed to : Continue current level of physical activity    Behavioral Modifications Evidence-based interventions for health behavior change were utilized today including the discussion of   1) SMART goals for next OV:    - Continue to shed fat during the holidays   Regarding patient's less desirable eating habits and patterns, we employed the technique of small changes.   We discussed the following today: increasing lean protein intake to established goals, avoiding skipping meals, increasing water intake , and work on tracking and journaling calories using tracking application Additional resources provided today: None   Medical Interventions/ Pharmacotherapy Previous Bariatric surgery: n/a Pharmacotherapy for weight loss: She is currently taking Metformin  500 mg BID and Wellbutrin  SR 150 mg BID for medical weight loss.    We discussed various medication options to help [name] with her weight loss efforts and we both agreed to : Adequate clinical response to anti-obesity medication, continue current regimen   B) OBESITY RELATED CONDITIONS ADDRESSED TODAY:  Chronic idiopathic constipation Pre-diabetes Assessment & Plan Lab Results  Component Value Date   HGBA1C 6.1 (H) 05/02/2024   HGBA1C 6.0 (H) 09/05/2023   HGBA1C 5.8 (H) 03/31/2023   INSULIN  24.6 05/02/2024   INSULIN  13.1 09/05/2023  INSULIN  28.8 (H) 03/31/2023     On Metformin  500 mg BID. With reported good compliance and tolerance. No complaints of excessive hunger or cravings. Patients states that she hits about 80 g of proteins 3 or maybe 4 times a week but not consistently. For her constipation she is taking Linzess  290 mcg once daily with reported good compliance. She reports that it has helped. Stressed to patient the importance of getting all proteins in and following meal plan. Will refill Metformin  today.      Mood disorder - emotional eating Assessment & Plan On Wellbutrin  SR 150 mg BID.  With reported good compliance and tolerance. Patient states that her emotional eating is well controlled and feels like the Wellbutrin  has helped. Continue to work on finding ways to directv. Will refill today. Continue with medication.   Medications Discontinued During This Encounter  Medication Reason   Vitamin D , Ergocalciferol , (DRISDOL ) 1.25 MG (50000 UNIT) CAPS capsule Reorder     Meds ordered this encounter  Medications   Vitamin D , Ergocalciferol , (DRISDOL ) 1.25 MG (50000 UNIT) CAPS capsule    Sig: 1 po q wed, and 1 po q sun    Dispense:  24 capsule    Refill:  0    Ov for rf      Follow up:   Return 10/18/2024 at 3:40 PM  She was informed of the importance of frequent follow up visits to maximize her success with intensive lifestyle modifications for her multiple health conditions.   Weight Summary and Biometrics   Weight Lost Since Last Visit: 3 lb  Weight Gained Since Last Visit: 0   Vitals Temp: 98 F (36.7 C) BP: 120/75 Pulse Rate: 71 SpO2: 99 %   Anthropometric Measurements Height: 5' 2 (1.575 m) Weight: 227 lb (103 kg) BMI (Calculated): 41.51 Weight at Last Visit: 230 lb Weight Lost Since Last Visit: 3 lb Weight Gained Since Last Visit: 0 Starting Weight: 240 lb Total Weight Loss (lbs): 13 lb (5.897 kg)   Body Composition  Body Fat %: 49.4 % Fat Mass (lbs): 112.2 lbs Muscle Mass (lbs): 109.2 lbs Total Body Water (lbs): 76.2 lbs Visceral Fat Rating : 17   Other Clinical Data Fasting: no Labs: no Today's Visit #: 52 Starting Date: 04/14/20    Objective:   PHYSICAL EXAM: Blood pressure 120/75, pulse 71, temperature 98 F (36.7 C), height 5' 2 (1.575 m), weight 227 lb (103 kg), SpO2 99%. Body mass index is 41.52 kg/m.  General: she is overweight, cooperative and in no acute distress. PSYCH: Has normal mood, affect and thought process.    HEENT: EOMI, sclerae are anicteric. Lungs: Normal breathing effort, no conversational dyspnea. Extremities: Moves * 4 Neurologic: A and O * 3, good insight  DIAGNOSTIC DATA REVIEWED: BMET    Component Value Date/Time   NA 140 05/02/2024 1057   K 4.2 05/02/2024 1057   CL 100 05/02/2024 1057   CO2 23 05/02/2024 1057   GLUCOSE 81 05/02/2024 1057   GLUCOSE 95 11/12/2019 0930   BUN 17 05/02/2024 1057   CREATININE 1.04 (H) 05/02/2024 1057   CALCIUM  10.0 05/02/2024 1057   GFRNONAA 91 04/15/2020 1654   GFRAA 105 04/15/2020 1654   Lab Results  Component Value Date   HGBA1C 6.1 (H) 05/02/2024   HGBA1C 6.1 (H) 04/15/2020   Lab Results  Component Value Date   INSULIN  24.6 05/02/2024   INSULIN  21.9 04/15/2020   Lab Results  Component Value Date   TSH 0.95  12/30/2022   CBC    Component Value Date/Time   WBC 6.9 05/02/2024 1057   WBC 7.2 12/30/2022 1429   RBC 6.02 (H) 05/02/2024 1057   RBC 5.91 (H) 12/30/2022 1429   HGB 12.7 05/02/2024 1057   HCT 42.5 05/02/2024 1057   PLT 194 05/02/2024 1057   MCV 71 (L) 05/02/2024 1057   MCH 21.1 (L) 05/02/2024 1057   MCH 21.7 (L) 09/25/2016 0012   MCHC 29.9 (L) 05/02/2024 1057   MCHC 32.0 12/30/2022 1429   RDW 18.4 (H) 05/02/2024 1057   Iron  Studies    Component Value Date/Time   IRON  62 12/30/2022 1429   IRON  63 11/03/2021 0942   TIBC 365.4 12/30/2022 1429   TIBC 332 11/03/2021 0942   FERRITIN 38.7 12/30/2022 1429   FERRITIN 72 11/03/2021 0942   IRONPCTSAT 17.0 (L) 12/30/2022 1429   IRONPCTSAT 19 11/03/2021 0942   Lipid Panel     Component Value Date/Time   CHOL 144 05/02/2024 1057   TRIG 61 05/02/2024 1057   HDL 70 05/02/2024 1057   CHOLHDL 2.1 05/02/2024 1057   CHOLHDL 3 11/06/2010 1638   VLDL 20.8 11/06/2010 1638   LDLCALC 61 05/02/2024 1057   LDLDIRECT 96 11/22/2022 1446   Hepatic Function Panel     Component Value Date/Time   PROT 6.8 05/02/2024 1057   ALBUMIN 4.5 05/02/2024 1057   AST 15 05/02/2024 1057    ALT 12 05/02/2024 1057   ALKPHOS 93 05/02/2024 1057   BILITOT 0.3 05/02/2024 1057   BILIDIR 0.0 01/30/2013 1213      Component Value Date/Time   TSH 0.95 12/30/2022 1429   Nutritional Lab Results  Component Value Date   VD25OH 71.3 05/02/2024   VD25OH 33.1 09/05/2023   VD25OH 63.6 03/31/2023    Attestations:   I, Sonny Laroche, acting as a stage manager for Molly Jenkins, DO., have compiled all relevant documentation for today's office visit on behalf of Molly Jenkins, DO, while in the presence of [employer], DO.  I have reviewed the above documentation for accuracy and completeness, and I agree with the above. Molly JINNY Bean, D.O.  The 21st Century Cures Act was signed into law in 2016 which includes the topic of electronic health records.  This provides immediate access to information in MyChart.  This includes consultation notes, operative notes, office notes, lab results and pathology reports.  If you have any questions about what you read please let us  know at your next visit so we can discuss your concerns and take corrective action if need be.  We are right here with you.

## 2024-10-03 ENCOUNTER — Ambulatory Visit (INDEPENDENT_AMBULATORY_CARE_PROVIDER_SITE_OTHER)

## 2024-10-03 DIAGNOSIS — Z23 Encounter for immunization: Secondary | ICD-10-CM | POA: Diagnosis not present

## 2024-10-03 NOTE — Progress Notes (Signed)
 After obtaining consent, and per orders of Dr. Joshua, injection of HDFLU given by Ronnald SHAUNNA Palms. Patient instructed to report any adverse reaction to me immediately.

## 2024-10-11 ENCOUNTER — Ambulatory Visit: Admitting: Dermatology

## 2024-10-18 ENCOUNTER — Ambulatory Visit (INDEPENDENT_AMBULATORY_CARE_PROVIDER_SITE_OTHER): Admitting: Family Medicine

## 2024-10-18 ENCOUNTER — Encounter (INDEPENDENT_AMBULATORY_CARE_PROVIDER_SITE_OTHER): Payer: Self-pay | Admitting: Family Medicine

## 2024-10-18 VITALS — BP 119/78 | HR 66 | Temp 98.2°F | Ht 62.0 in | Wt 229.0 lb

## 2024-10-18 DIAGNOSIS — R7303 Prediabetes: Secondary | ICD-10-CM | POA: Diagnosis not present

## 2024-10-18 DIAGNOSIS — F5089 Other specified eating disorder: Secondary | ICD-10-CM | POA: Diagnosis not present

## 2024-10-18 DIAGNOSIS — K5904 Chronic idiopathic constipation: Secondary | ICD-10-CM

## 2024-10-18 DIAGNOSIS — Z6841 Body Mass Index (BMI) 40.0 and over, adult: Secondary | ICD-10-CM

## 2024-10-18 DIAGNOSIS — E669 Obesity, unspecified: Secondary | ICD-10-CM

## 2024-10-18 DIAGNOSIS — F39 Unspecified mood [affective] disorder: Secondary | ICD-10-CM | POA: Diagnosis not present

## 2024-10-18 MED ORDER — METFORMIN HCL 500 MG PO TABS: 500.0000 mg | ORAL_TABLET | Freq: Two times a day (BID) | ORAL | 0 refills | Status: AC

## 2024-10-18 MED ORDER — VITAMIN D (ERGOCALCIFEROL) 1.25 MG (50000 UNIT) PO CAPS: ORAL_CAPSULE | ORAL | 0 refills | Status: AC

## 2024-10-18 MED ORDER — BUPROPION HCL ER (SR) 150 MG PO TB12: 150.0000 mg | ORAL_TABLET | Freq: Two times a day (BID) | ORAL | 0 refills | Status: AC

## 2024-10-18 MED ORDER — LINACLOTIDE 290 MCG PO CAPS: 290.0000 ug | ORAL_CAPSULE | Freq: Every day | ORAL | 0 refills | Status: AC

## 2024-10-18 NOTE — Progress Notes (Signed)
 "  Molly Bean, D.O.  ABFM, ABOM Specializing in Clinical Bariatric Medicine  Office located at: [address] [place], [place]  [address]      Medications Discontinued BUT RE-ORDERED During This Encounter  Medication Reason  buPROPion  (WELLBUTRIN  SR) 150 MG 12 hr tablet Reorder  linaclotide  (LINZESS ) 290 MCG CAPS capsule Reorder  metFORMIN  (GLUCOPHAGE ) 500 MG tablet Reorder  Vitamin D , Ergocalciferol , (DRISDOL ) 1.25 MG (50000 UNIT) CAPS capsule Reorder     Meds ordered this encounter  Medications   metFORMIN  (GLUCOPHAGE ) 500 MG tablet    Sig: Take 1 tablet (500 mg total) by mouth 2 (two) times daily with a meal.    Dispense:  180 tablet    Refill:  0    Ov for rf   buPROPion  (WELLBUTRIN  SR) 150 MG 12 hr tablet    Sig: Take 1 tablet (150 mg total) by mouth 2 (two) times daily.    Dispense:  180 tablet    Refill:  0   linaclotide  (LINZESS ) 290 MCG CAPS capsule    Sig: Take 1 capsule (290 mcg total) by mouth daily before breakfast.    Dispense:  90 capsule    Refill:  0   Vitamin D , Ergocalciferol , (DRISDOL ) 1.25 MG (50000 UNIT) CAPS capsule    Sig: 1 po q wed, and 1 po q sun    Dispense:  24 capsule    Refill:  0    Ov for rf      A) FOR THE CHRONIC DISEASE OF OBESITY:  Chief complaint: Obesity Molly Bean is here to discuss her progress with her obesity treatment plan.   History of present illness / Interval history:  Molly Bean is here today for her follow-up office visit.  Since last OV on 09/11/24, pt is up 2 lbs. Patient states that she did good during the holidays and did even better than she planned. She had increased stress however, and she has separated from her husband for now.     09/11/24 15:00 10/18/24 15:00   Body Fat % 49.4 % 51.4 %  Muscle Mass (lbs) 109.2 lbs 106 lbs  Fat Mass (lbs) 112.2 lbs 118 lbs  Total Body Water (lbs) 76.2 lbs 79.8 lbs  Visceral Fat Rating  17 18   Counseling done on how various foods will affect these numbers  and how to maximize success.  Explained how excess calories above and beyond her goal will be stored as adipose tissue.  Lack of continuous exercise and increased stressors likely contributed as well.   Total lbs lost to date: - 11 lbs Total Fat Mass in lbs lost to date: - 5.8 lbs Total weight loss percentage to date: - 4.58 %    Obesity, Beginning BMI 43.90 BMI 40.0-44.9, adult (HCC)- current bmi 41.87 Nutrition Therapy She is journaling [phone] calorie and 80+ grams protein daily and states she is following her eating plan approximately 80 % of the time.   - Tracking Calories/Macros: no   - Eating More Whole Foods: yes  - Adequate Protein Intake: yes  - Adequate Water Intake: no   - Skipping Meals: no  - Sleeping 7-9 Hours/ Night: yes   Molly Bean is currently in the action stage of change. As such, her goal is to continue weight management plan.  She has agreed to: follow the Category 1 plan (per patient prefers this over journaling)    Physical Activity Molly Bean is doing weights 45  minutes 6 days per week  Molly Bean has been advised to work up to 300-450 minutes of moderate intensity aerobic activity a week and strengthening exercises 2-3 times per week for cardiovascular health, weight loss maintenance and preservation of muscle mass.  She has agreed to : Continue to gradually increase the amount and intensity of exercise routine and Combine aerobic and strengthening exercises for efficiency and improved cardiometabolic health.   Behavioral Modifications We discussed the following today: continue to work on maintaining a reduced calorie state, getting the recommended amount of protein, incorporating whole foods, making healthy choices, staying well hydrated and practicing mindfulness when eating. Additional resources provided today: None   Medical Interventions/ Pharmacotherapy Previous Bariatric surgery: n/a Pharmacotherapy for weight loss: She is currently taking  Metformin  500 mg BID and Wellbutrin  SR 150 mg BID to aid in medical weight loss.    We discussed various medication options to help Analiz with her weight loss efforts and we both agreed to : continue current anti-obesity regimen but focus on diet/ exercise regimen   B) OBESITY RELATED CONDITIONS ADDRESSED TODAY:  Pre-diabetes Assessment & Plan Lab Results  Component Value Date   HGBA1C 6.1 (H) 05/02/2024   HGBA1C 6.0 (H) 09/05/2023   HGBA1C 5.8 (H) 03/31/2023   INSULIN  24.6 05/02/2024   INSULIN  13.1 09/05/2023   INSULIN  28.8 (H) 03/31/2023    On Metformin  500 mg BID with reported good compliance and tolerance. Patient denies any any GI upset. Patient states that she has not been eating as much due to her stomach being in knots secondary to home / marital stressors.   She has been taking Linzess  290 mcg daily to help with her constipation, which is working well.  -Will refill both today. Continue following meal plan and decreasing simple carbs and sugars.     Mood disorder - emotional eating Assessment & Plan On Wellbutrin  SR 150 mg BID with reported good compliance and tolerance. Patient states that she recently separated from her husband. She reports that she is in a good place right now.  She denies doing any emotional eating because she still feels her stomach is in knots and skipping meals if anything has occurred.   She states that the dose of Wellbutrin  she is on right now is good and does not think increasing at the moment will be beneficial.  - Will refill today. Continue with medication.  Recommend counseling in future as needed. Will reassess at next OV.     Medications Discontinued During This Encounter  Medication Reason   Ferrous Sulfate (IRON ) 325 (65 Fe) MG TABS    buPROPion  (WELLBUTRIN  SR) 150 MG 12 hr tablet Reorder   linaclotide  (LINZESS ) 290 MCG CAPS capsule Reorder   metFORMIN  (GLUCOPHAGE ) 500 MG tablet Reorder   Vitamin D , Ergocalciferol , (DRISDOL ) 1.25  MG (50000 UNIT) CAPS capsule Reorder     Meds ordered this encounter  Medications   metFORMIN  (GLUCOPHAGE ) 500 MG tablet    Sig: Take 1 tablet (500 mg total) by mouth 2 (two) times daily with a meal.    Dispense:  180 tablet    Refill:  0    Ov for rf   buPROPion  (WELLBUTRIN  SR) 150 MG 12 hr tablet    Sig: Take 1 tablet (150 mg total) by mouth 2 (two) times daily.    Dispense:  180 tablet    Refill:  0   linaclotide  (LINZESS ) 290 MCG CAPS capsule    Sig: Take 1 capsule (290 mcg total) by mouth daily before breakfast.  Dispense:  90 capsule    Refill:  0   Vitamin D , Ergocalciferol , (DRISDOL ) 1.25 MG (50000 UNIT) CAPS capsule    Sig: 1 po q wed, and 1 po q sun    Dispense:  24 capsule    Refill:  0    Ov for rf      Follow up:   Return 12/03/2024 at 2:40 PM  She was informed of the importance of frequent follow up visits to maximize her success with intensive lifestyle modifications for her multiple health conditions.   Weight Summary and Biometrics   Weight Lost Since Last Visit: 0lb  Weight Gained Since Last Visit: 2lb   Vitals Temp: 98.2 F (36.8 C) BP: 119/78 Pulse Rate: 66 SpO2: 99 %   Anthropometric Measurements Height: 5' 2 (1.575 m) Weight: 229 lb (103.9 kg) BMI (Calculated): 41.87 Weight at Last Visit: 227lb Weight Lost Since Last Visit: 0lb Weight Gained Since Last Visit: 2lb Starting Weight: 240lb Total Weight Loss (lbs): 11 lb (4.99 kg)   Body Composition  Body Fat %: 51.4 % Fat Mass (lbs): 118 lbs Muscle Mass (lbs): 106 lbs Total Body Water (lbs): 79.8 lbs Visceral Fat Rating : 18   Other Clinical Data Fasting: no Labs: no Today's Visit #: 53 Starting Date: 04/14/20    Objective:   PHYSICAL EXAM: Blood pressure 119/78, pulse 66, temperature 98.2 F (36.8 C), height 5' 2 (1.575 m), weight 229 lb (103.9 kg), SpO2 99%. Body mass index is 41.88 kg/m.  General: she is overweight, cooperative and in no acute distress. PSYCH: Has  normal mood, affect and thought process.   HEENT: EOMI, sclerae are anicteric. Lungs: Normal breathing effort, no conversational dyspnea. Extremities: Moves * 4 Neurologic: A and O * 3, good insight  DIAGNOSTIC DATA REVIEWED: BMET    Component Value Date/Time   NA 140 05/02/2024 1057   K 4.2 05/02/2024 1057   CL 100 05/02/2024 1057   CO2 23 05/02/2024 1057   GLUCOSE 81 05/02/2024 1057   GLUCOSE 95 11/12/2019 0930   BUN 17 05/02/2024 1057   CREATININE 1.04 (H) 05/02/2024 1057   CALCIUM  10.0 05/02/2024 1057   GFRNONAA 91 04/15/2020 1654   GFRAA 105 04/15/2020 1654   Lab Results  Component Value Date   HGBA1C 6.1 (H) 05/02/2024   HGBA1C 6.1 (H) 04/15/2020   Lab Results  Component Value Date   INSULIN  24.6 05/02/2024   INSULIN  21.9 04/15/2020   Lab Results  Component Value Date   TSH 0.95 12/30/2022   CBC    Component Value Date/Time   WBC 6.9 05/02/2024 1057   WBC 7.2 12/30/2022 1429   RBC 6.02 (H) 05/02/2024 1057   RBC 5.91 (H) 12/30/2022 1429   HGB 12.7 05/02/2024 1057   HCT 42.5 05/02/2024 1057   PLT 194 05/02/2024 1057   MCV 71 (L) 05/02/2024 1057   MCH 21.1 (L) 05/02/2024 1057   MCH 21.7 (L) 09/25/2016 0012   MCHC 29.9 (L) 05/02/2024 1057   MCHC 32.0 12/30/2022 1429   RDW 18.4 (H) 05/02/2024 1057   Iron  Studies    Component Value Date/Time   IRON  62 12/30/2022 1429   IRON  63 11/03/2021 0942   TIBC 365.4 12/30/2022 1429   TIBC 332 11/03/2021 0942   FERRITIN 38.7 12/30/2022 1429   FERRITIN 72 11/03/2021 0942   IRONPCTSAT 17.0 (L) 12/30/2022 1429   IRONPCTSAT 19 11/03/2021 0942   Lipid Panel     Component Value Date/Time   CHOL  144 05/02/2024 1057   TRIG 61 05/02/2024 1057   HDL 70 05/02/2024 1057   CHOLHDL 2.1 05/02/2024 1057   CHOLHDL 3 11/06/2010 1638   VLDL 20.8 11/06/2010 1638   LDLCALC 61 05/02/2024 1057   LDLDIRECT 96 11/22/2022 1446   Hepatic Function Panel     Component Value Date/Time   PROT 6.8 05/02/2024 1057   ALBUMIN 4.5  05/02/2024 1057   AST 15 05/02/2024 1057   ALT 12 05/02/2024 1057   ALKPHOS 93 05/02/2024 1057   BILITOT 0.3 05/02/2024 1057   BILIDIR 0.0 01/30/2013 1213      Component Value Date/Time   TSH 0.95 12/30/2022 1429   Nutritional Lab Results  Component Value Date   VD25OH 71.3 05/02/2024   VD25OH 33.1 09/05/2023   VD25OH 63.6 03/31/2023    Attestations:   I, Sonny Laroche, acting as a stage manager for Molly Jenkins, DO., have compiled all relevant documentation for today's office visit on behalf of Molly Jenkins, DO, while in the presence of [employer], DO.   I have reviewed the above documentation for accuracy and completeness, and I agree with the above. Molly JINNY Bean, D.O.  The 21st Century Cures Act was signed into law in 2016 which includes the topic of electronic health records.  This provides immediate access to information in MyChart.  This includes consultation notes, operative notes, office notes, lab results and pathology reports.  If you have any questions about what you read please let us  know at your next visit so we can discuss your concerns and take corrective action if need be.  We are right here with you.  "

## 2024-10-30 NOTE — Addendum Note (Signed)
 Addended by: Orlandus Borowski J on: 10/30/2024 03:31 PM   Modules accepted: Level of Service

## 2024-12-03 ENCOUNTER — Ambulatory Visit (INDEPENDENT_AMBULATORY_CARE_PROVIDER_SITE_OTHER): Admitting: Family Medicine
